# Patient Record
Sex: Female | Born: 1935 | Race: White | Hispanic: No | Smoking: Former smoker
Health system: Southern US, Community
[De-identification: ages and names within clinical notes are randomized; demographics above are authoritative.]

## PROBLEM LIST (undated history)

## (undated) DIAGNOSIS — I351 Nonrheumatic aortic (valve) insufficiency: Secondary | ICD-10-CM

## (undated) DIAGNOSIS — K589 Irritable bowel syndrome without diarrhea: Secondary | ICD-10-CM

## (undated) DIAGNOSIS — K219 Gastro-esophageal reflux disease without esophagitis: Secondary | ICD-10-CM

## (undated) DIAGNOSIS — T7840XA Allergy, unspecified, initial encounter: Secondary | ICD-10-CM

## (undated) DIAGNOSIS — C801 Malignant (primary) neoplasm, unspecified: Secondary | ICD-10-CM

## (undated) DIAGNOSIS — K759 Inflammatory liver disease, unspecified: Secondary | ICD-10-CM

## (undated) DIAGNOSIS — M8580 Other specified disorders of bone density and structure, unspecified site: Secondary | ICD-10-CM

## (undated) DIAGNOSIS — D126 Benign neoplasm of colon, unspecified: Secondary | ICD-10-CM

## (undated) DIAGNOSIS — I493 Ventricular premature depolarization: Secondary | ICD-10-CM

## (undated) DIAGNOSIS — I059 Rheumatic mitral valve disease, unspecified: Secondary | ICD-10-CM

## (undated) DIAGNOSIS — I10 Essential (primary) hypertension: Secondary | ICD-10-CM

## (undated) DIAGNOSIS — D649 Anemia, unspecified: Secondary | ICD-10-CM

## (undated) DIAGNOSIS — Z8679 Personal history of other diseases of the circulatory system: Secondary | ICD-10-CM

## (undated) DIAGNOSIS — R002 Palpitations: Secondary | ICD-10-CM

## (undated) DIAGNOSIS — M3500 Sicca syndrome, unspecified: Secondary | ICD-10-CM

## (undated) DIAGNOSIS — K5730 Diverticulosis of large intestine without perforation or abscess without bleeding: Secondary | ICD-10-CM

## (undated) DIAGNOSIS — M069 Rheumatoid arthritis, unspecified: Secondary | ICD-10-CM

## (undated) DIAGNOSIS — I499 Cardiac arrhythmia, unspecified: Secondary | ICD-10-CM

## (undated) DIAGNOSIS — H04129 Dry eye syndrome of unspecified lacrimal gland: Secondary | ICD-10-CM

## (undated) DIAGNOSIS — N183 Chronic kidney disease, stage 3 (moderate): Secondary | ICD-10-CM

## (undated) DIAGNOSIS — IMO0002 Reserved for concepts with insufficient information to code with codable children: Secondary | ICD-10-CM

## (undated) DIAGNOSIS — H3530 Unspecified macular degeneration: Secondary | ICD-10-CM

## (undated) HISTORY — PX: CATARACT EXTRACTION: SUR2

## (undated) HISTORY — DX: Chronic kidney disease, stage 3 (moderate): N18.3

## (undated) HISTORY — PX: OTHER SURGICAL HISTORY: SHX169

## (undated) HISTORY — DX: Dry eye syndrome of unspecified lacrimal gland: H04.129

## (undated) HISTORY — DX: Irritable bowel syndrome, unspecified: K58.9

## (undated) HISTORY — DX: Personal history of other diseases of the circulatory system: Z86.79

## (undated) HISTORY — DX: Reserved for concepts with insufficient information to code with codable children: IMO0002

## (undated) HISTORY — DX: Sjogren syndrome, unspecified: M35.00

## (undated) HISTORY — DX: Diverticulosis of large intestine without perforation or abscess without bleeding: K57.30

## (undated) HISTORY — DX: Anemia, unspecified: D64.9

## (undated) HISTORY — DX: Palpitations: R00.2

## (undated) HISTORY — PX: TUBAL LIGATION: SHX77

## (undated) HISTORY — DX: Unspecified macular degeneration: H35.30

## (undated) HISTORY — PX: REFRACTIVE SURGERY: SHX103

## (undated) HISTORY — PX: CHOLECYSTECTOMY: SHX55

## (undated) HISTORY — DX: Rheumatic mitral valve disease, unspecified: I05.9

## (undated) HISTORY — PX: HIATAL HERNIA REPAIR: SHX195

## (undated) HISTORY — DX: Malignant (primary) neoplasm, unspecified: C80.1

## (undated) HISTORY — DX: Rheumatoid arthritis, unspecified: M06.9

## (undated) HISTORY — DX: Nonrheumatic aortic (valve) insufficiency: I35.1

## (undated) HISTORY — DX: Ventricular premature depolarization: I49.3

## (undated) HISTORY — DX: Other specified disorders of bone density and structure, unspecified site: M85.80

## (undated) HISTORY — DX: Allergy, unspecified, initial encounter: T78.40XA

## (undated) HISTORY — DX: Gastro-esophageal reflux disease without esophagitis: K21.9

## (undated) HISTORY — DX: Benign neoplasm of colon, unspecified: D12.6

## (undated) HISTORY — DX: Essential (primary) hypertension: I10

## (undated) HISTORY — PX: APPENDECTOMY: SHX54

---

## 1967-11-30 HISTORY — PX: OTHER SURGICAL HISTORY: SHX169

## 1990-11-29 DIAGNOSIS — IMO0002 Reserved for concepts with insufficient information to code with codable children: Secondary | ICD-10-CM

## 1990-11-29 HISTORY — DX: Reserved for concepts with insufficient information to code with codable children: IMO0002

## 1999-02-26 ENCOUNTER — Other Ambulatory Visit: Admission: RE | Admit: 1999-02-26 | Discharge: 1999-02-26 | Payer: Self-pay | Admitting: Obstetrics and Gynecology

## 2000-03-02 ENCOUNTER — Other Ambulatory Visit: Admission: RE | Admit: 2000-03-02 | Discharge: 2000-03-02 | Payer: Self-pay | Admitting: Obstetrics and Gynecology

## 2001-03-06 ENCOUNTER — Other Ambulatory Visit: Admission: RE | Admit: 2001-03-06 | Discharge: 2001-03-06 | Payer: Self-pay | Admitting: Obstetrics and Gynecology

## 2001-04-26 ENCOUNTER — Ambulatory Visit (HOSPITAL_BASED_OUTPATIENT_CLINIC_OR_DEPARTMENT_OTHER): Admission: RE | Admit: 2001-04-26 | Discharge: 2001-04-27 | Payer: Self-pay | Admitting: Orthopedic Surgery

## 2002-03-07 ENCOUNTER — Other Ambulatory Visit: Admission: RE | Admit: 2002-03-07 | Discharge: 2002-03-07 | Payer: Self-pay | Admitting: Obstetrics and Gynecology

## 2003-03-15 ENCOUNTER — Other Ambulatory Visit: Admission: RE | Admit: 2003-03-15 | Discharge: 2003-03-15 | Payer: Self-pay | Admitting: Obstetrics and Gynecology

## 2004-04-15 ENCOUNTER — Other Ambulatory Visit: Admission: RE | Admit: 2004-04-15 | Discharge: 2004-04-15 | Payer: Self-pay | Admitting: Obstetrics and Gynecology

## 2004-04-18 ENCOUNTER — Emergency Department (HOSPITAL_COMMUNITY): Admission: EM | Admit: 2004-04-18 | Discharge: 2004-04-18 | Payer: Self-pay | Admitting: *Deleted

## 2005-04-22 ENCOUNTER — Other Ambulatory Visit: Admission: RE | Admit: 2005-04-22 | Discharge: 2005-04-22 | Payer: Self-pay | Admitting: Addiction Medicine

## 2005-05-20 ENCOUNTER — Ambulatory Visit: Payer: Self-pay | Admitting: Pulmonary Disease

## 2005-06-10 ENCOUNTER — Ambulatory Visit: Payer: Self-pay | Admitting: Pulmonary Disease

## 2005-06-14 ENCOUNTER — Ambulatory Visit: Payer: Self-pay | Admitting: Pulmonary Disease

## 2005-06-22 ENCOUNTER — Ambulatory Visit: Payer: Self-pay | Admitting: Internal Medicine

## 2005-07-06 ENCOUNTER — Ambulatory Visit: Payer: Self-pay | Admitting: Internal Medicine

## 2006-04-26 ENCOUNTER — Other Ambulatory Visit: Admission: RE | Admit: 2006-04-26 | Discharge: 2006-04-26 | Payer: Self-pay | Admitting: Obstetrics and Gynecology

## 2006-05-23 ENCOUNTER — Ambulatory Visit: Payer: Self-pay | Admitting: Pulmonary Disease

## 2006-06-13 ENCOUNTER — Ambulatory Visit: Payer: Self-pay | Admitting: Pulmonary Disease

## 2006-07-19 ENCOUNTER — Ambulatory Visit: Payer: Self-pay | Admitting: Pulmonary Disease

## 2006-08-16 ENCOUNTER — Ambulatory Visit: Payer: Self-pay | Admitting: Pulmonary Disease

## 2007-05-26 ENCOUNTER — Ambulatory Visit: Payer: Self-pay | Admitting: Pulmonary Disease

## 2007-05-26 LAB — CONVERTED CEMR LAB
ALT: 13 units/L (ref 0–35)
AST: 17 units/L (ref 0–37)
Bilirubin Urine: NEGATIVE
Bilirubin, Direct: 0.1 mg/dL (ref 0.0–0.3)
CO2: 30 meq/L (ref 19–32)
Calcium: 9.3 mg/dL (ref 8.4–10.5)
Chloride: 107 meq/L (ref 96–112)
Cholesterol: 215 mg/dL (ref 0–200)
Creatinine, Ser: 0.9 mg/dL (ref 0.4–1.2)
GFR calc non Af Amer: 66 mL/min
Glucose, Bld: 96 mg/dL (ref 70–99)
HDL: 98.9 mg/dL (ref >39.0)
Leukocytes, UA: NEGATIVE
Total Bilirubin: 0.7 mg/dL (ref 0.3–1.2)
Urobilinogen, UA: 0.2 (ref 0.0–1.0)
pH: 6 (ref 5.0–8.0)

## 2007-06-21 ENCOUNTER — Ambulatory Visit: Payer: Self-pay | Admitting: Pulmonary Disease

## 2007-06-21 LAB — CONVERTED CEMR LAB
Fecal Occult Blood: NEGATIVE
OCCULT 1: NEGATIVE
OCCULT 2: NEGATIVE
OCCULT 4: NEGATIVE
OCCULT 5: NEGATIVE

## 2007-10-09 ENCOUNTER — Ambulatory Visit: Payer: Self-pay | Admitting: Pulmonary Disease

## 2007-11-30 HISTORY — PX: ROTATOR CUFF REPAIR: SHX139

## 2008-05-09 ENCOUNTER — Other Ambulatory Visit: Admission: RE | Admit: 2008-05-09 | Discharge: 2008-05-09 | Payer: Self-pay | Admitting: Obstetrics and Gynecology

## 2008-05-15 ENCOUNTER — Encounter: Payer: Self-pay | Admitting: Pulmonary Disease

## 2008-09-04 DIAGNOSIS — E7800 Pure hypercholesterolemia, unspecified: Secondary | ICD-10-CM

## 2008-09-04 DIAGNOSIS — K589 Irritable bowel syndrome without diarrhea: Secondary | ICD-10-CM | POA: Insufficient documentation

## 2008-09-04 DIAGNOSIS — D126 Benign neoplasm of colon, unspecified: Secondary | ICD-10-CM

## 2008-09-04 DIAGNOSIS — IMO0001 Reserved for inherently not codable concepts without codable children: Secondary | ICD-10-CM

## 2008-09-04 DIAGNOSIS — T7840XA Allergy, unspecified, initial encounter: Secondary | ICD-10-CM | POA: Insufficient documentation

## 2008-09-04 DIAGNOSIS — K219 Gastro-esophageal reflux disease without esophagitis: Secondary | ICD-10-CM | POA: Insufficient documentation

## 2008-09-04 HISTORY — DX: Benign neoplasm of colon, unspecified: D12.6

## 2008-09-05 ENCOUNTER — Ambulatory Visit: Payer: Self-pay | Admitting: Pulmonary Disease

## 2008-09-05 DIAGNOSIS — M8580 Other specified disorders of bone density and structure, unspecified site: Secondary | ICD-10-CM | POA: Insufficient documentation

## 2008-09-05 DIAGNOSIS — I480 Paroxysmal atrial fibrillation: Secondary | ICD-10-CM | POA: Insufficient documentation

## 2008-09-05 DIAGNOSIS — Z8679 Personal history of other diseases of the circulatory system: Secondary | ICD-10-CM

## 2008-09-18 ENCOUNTER — Telehealth: Payer: Self-pay | Admitting: Pulmonary Disease

## 2008-09-28 LAB — CONVERTED CEMR LAB
ALT: 17 units/L (ref 0–35)
Bilirubin Urine: NEGATIVE
Bilirubin, Direct: 0.2 mg/dL (ref 0.0–0.3)
CO2: 25 meq/L (ref 19–32)
Calcium: 9.3 mg/dL (ref 8.4–10.5)
Creatinine, Ser: 0.9 mg/dL (ref 0.4–1.2)
Eosinophils Relative: 1.2 % (ref 0.0–5.0)
Glucose, Bld: 93 mg/dL (ref 70–99)
HCT: 38.4 % (ref 36.0–46.0)
Hemoglobin, Urine: NEGATIVE
Hemoglobin: 13.4 g/dL (ref 12.0–15.0)
Monocytes Absolute: 0.9 10*3/uL (ref 0.1–1.0)
Monocytes Relative: 8.3 % (ref 3.0–12.0)
Neutro Abs: 6.9 10*3/uL (ref 1.4–7.7)
Sodium: 138 meq/L (ref 135–145)
Total CHOL/HDL Ratio: 2.4
Total Protein, Urine: NEGATIVE mg/dL
Total Protein: 7.6 g/dL (ref 6.0–8.3)
Triglycerides: 100 mg/dL (ref 0–149)
Urine Glucose: NEGATIVE mg/dL
WBC: 10.4 10*3/uL (ref 4.5–10.5)

## 2008-11-05 ENCOUNTER — Encounter: Payer: Self-pay | Admitting: Adult Health

## 2008-11-05 ENCOUNTER — Ambulatory Visit: Payer: Self-pay | Admitting: Pulmonary Disease

## 2008-11-06 DIAGNOSIS — R002 Palpitations: Secondary | ICD-10-CM | POA: Insufficient documentation

## 2008-11-08 ENCOUNTER — Ambulatory Visit: Payer: Self-pay | Admitting: Pulmonary Disease

## 2008-11-08 ENCOUNTER — Ambulatory Visit: Payer: Self-pay | Admitting: Cardiovascular Disease

## 2008-11-08 ENCOUNTER — Ambulatory Visit: Payer: Self-pay

## 2008-11-26 ENCOUNTER — Ambulatory Visit: Payer: Self-pay

## 2008-11-26 ENCOUNTER — Encounter: Payer: Self-pay | Admitting: Pulmonary Disease

## 2008-11-26 ENCOUNTER — Encounter: Payer: Self-pay | Admitting: Cardiovascular Disease

## 2008-12-17 ENCOUNTER — Ambulatory Visit: Payer: Self-pay | Admitting: Cardiovascular Disease

## 2008-12-25 ENCOUNTER — Ambulatory Visit: Payer: Self-pay | Admitting: Pulmonary Disease

## 2008-12-25 DIAGNOSIS — H04129 Dry eye syndrome of unspecified lacrimal gland: Secondary | ICD-10-CM | POA: Insufficient documentation

## 2008-12-25 DIAGNOSIS — I341 Nonrheumatic mitral (valve) prolapse: Secondary | ICD-10-CM

## 2008-12-30 ENCOUNTER — Ambulatory Visit: Payer: Self-pay | Admitting: Internal Medicine

## 2009-05-21 ENCOUNTER — Encounter: Payer: Self-pay | Admitting: Obstetrics and Gynecology

## 2009-05-21 ENCOUNTER — Ambulatory Visit: Payer: Self-pay | Admitting: Obstetrics and Gynecology

## 2009-05-21 ENCOUNTER — Other Ambulatory Visit: Admission: RE | Admit: 2009-05-21 | Discharge: 2009-05-21 | Payer: Self-pay | Admitting: Obstetrics and Gynecology

## 2009-06-04 DIAGNOSIS — I493 Ventricular premature depolarization: Secondary | ICD-10-CM

## 2009-06-05 ENCOUNTER — Ambulatory Visit: Payer: Self-pay | Admitting: Cardiovascular Disease

## 2009-08-15 ENCOUNTER — Encounter: Payer: Self-pay | Admitting: Cardiovascular Disease

## 2009-08-19 ENCOUNTER — Ambulatory Visit (HOSPITAL_COMMUNITY): Admission: RE | Admit: 2009-08-19 | Discharge: 2009-08-19 | Payer: Self-pay | Admitting: Rheumatology

## 2009-08-19 ENCOUNTER — Telehealth (INDEPENDENT_AMBULATORY_CARE_PROVIDER_SITE_OTHER): Payer: Self-pay | Admitting: *Deleted

## 2009-08-21 ENCOUNTER — Ambulatory Visit: Payer: Self-pay | Admitting: Pulmonary Disease

## 2009-08-21 DIAGNOSIS — M19042 Primary osteoarthritis, left hand: Secondary | ICD-10-CM

## 2009-08-21 DIAGNOSIS — M19041 Primary osteoarthritis, right hand: Secondary | ICD-10-CM

## 2009-08-22 DIAGNOSIS — K5730 Diverticulosis of large intestine without perforation or abscess without bleeding: Secondary | ICD-10-CM | POA: Insufficient documentation

## 2009-08-27 ENCOUNTER — Ambulatory Visit: Payer: Self-pay | Admitting: Pulmonary Disease

## 2009-09-12 ENCOUNTER — Encounter: Payer: Self-pay | Admitting: Pulmonary Disease

## 2010-02-17 ENCOUNTER — Ambulatory Visit: Payer: Self-pay | Admitting: Pulmonary Disease

## 2010-05-05 ENCOUNTER — Telehealth: Payer: Self-pay | Admitting: Pulmonary Disease

## 2010-06-03 ENCOUNTER — Encounter (INDEPENDENT_AMBULATORY_CARE_PROVIDER_SITE_OTHER): Payer: Self-pay | Admitting: *Deleted

## 2010-06-04 ENCOUNTER — Ambulatory Visit: Payer: Self-pay | Admitting: Cardiovascular Disease

## 2010-06-08 ENCOUNTER — Ambulatory Visit: Payer: Self-pay | Admitting: Obstetrics and Gynecology

## 2010-07-10 ENCOUNTER — Encounter: Payer: Self-pay | Admitting: Pulmonary Disease

## 2010-08-11 ENCOUNTER — Telehealth: Payer: Self-pay | Admitting: Pulmonary Disease

## 2010-08-13 ENCOUNTER — Ambulatory Visit: Payer: Self-pay | Admitting: Pulmonary Disease

## 2010-08-13 DIAGNOSIS — D649 Anemia, unspecified: Secondary | ICD-10-CM

## 2010-08-20 ENCOUNTER — Ambulatory Visit: Payer: Self-pay | Admitting: Pulmonary Disease

## 2010-08-21 LAB — CONVERTED CEMR LAB
ALT: 12 units/L (ref 0–35)
AST: 18 units/L (ref 0–37)
Alkaline Phosphatase: 48 units/L (ref 39–117)
BUN: 15 mg/dL (ref 6–23)
Bilirubin, Direct: 0.1 mg/dL (ref 0.0–0.3)
Chloride: 106 meq/L (ref 96–112)
Cholesterol: 189 mg/dL (ref 0–200)
Eosinophils Absolute: 0.2 10*3/uL (ref 0.0–0.7)
LDL Cholesterol: 90 mg/dL (ref 0–99)
MCHC: 34.3 g/dL (ref 30.0–36.0)
MCV: 94.5 fL (ref 78.0–100.0)
Monocytes Absolute: 0.5 10*3/uL (ref 0.1–1.0)
Neutrophils Relative %: 59.4 % (ref 43.0–77.0)
Platelets: 269 10*3/uL (ref 150.0–400.0)
Potassium: 4.8 meq/L (ref 3.5–5.1)
RDW: 13 % (ref 11.5–14.6)
Sodium: 141 meq/L (ref 135–145)
TSH: 2.81 microintl units/mL (ref 0.35–5.50)
Total Bilirubin: 0.6 mg/dL (ref 0.3–1.2)
Total CHOL/HDL Ratio: 2
Vit D, 25-Hydroxy: 52 ng/mL (ref 30–89)

## 2010-12-29 NOTE — Letter (Signed)
Summary: [facility]  [facility]   Imported By: Sherian Rein 07/23/2010 07:51:22  _____________________________________________________________________  External Attachment:    Type:   Image     Comment:   External Document

## 2010-12-29 NOTE — Assessment & Plan Note (Signed)
Summary: F1Y/DM  Medications Added ACYCLOVIR 800 MG TABS (ACYCLOVIR) when pt has an outbreak herpes      Allergies Added:   Primary Provider:  Dr. Kriste Basque  CC:  fatigue.  History of Present Illness: Bailey [name] is seen today in followup for palpitations and PVCs. She saw  Dr. Johney Frame in February and he agreed to continue beta blocker therapy.  she's been doing better.  She was contemplating not taking her p.m. dose according.  She had a trip to the [facility] which was exciting.  Her son recently had an ACL tear and she is helping take care of him. H er palpitations have been well under control and she has not had any significant chest pain dyspnea or syncope.  SHe is happy to continue with her beta blocker.  Her tests at the end of 2009 showed no evidence of structural heart disease.  She continues to see Dr. Nile Riggs for her eye care.   She does have a history of mitral valve prolapse and mild aortic insufficiency.  There is no indication for follow up echo at this time. She has been seeing Dr Corliss Skains for w.u of ? Sjorgens and some arthritis issues  Current Problems (verified): 1)  Hx of Paroxysmal Atrial Fibrillation  (ICD-427.31) 2)  Premature Ventricular Contractions  (ICD-427.69) 3)  Dry Eye Syndrome  (ICD-375.15) 4)  Allergy  (ICD-995.3) 5)  Palpitations  (ICD-785.1) 6)  Hx of Mitral Valve Prolapse  (ICD-424.0) 7)  Hypercholesterolemia  (ICD-272.0) 8)  Gerd  (ICD-530.81) 9)  Diverticulosis of Colon  (ICD-562.10) 10)  Irritable Bowel Syndrome  (ICD-564.1) 11)  Colonic Polyps  (ICD-211.3) 12)  Arthritis, Rheumatoid  (ICD-714.0) 13)  Fibromyalgia  (ICD-729.1) 14)  Osteopenia  (ICD-733.90)  Current Medications (verified): 1)  Restasis 0.05 % Emul (Cyclosporine) .... Use Two Times A Day 2)  Buffered Aspirin 325 Mg Tabs (Aspirin Buf(Cacarb-Mgcarb-Mgo)) .... Take 1 Tablet By Mouth Once A Day 3)  Carvedilol 3.125 Mg Tabs (Carvedilol) .... Take One Tablet By Mouth Once Daily As  Directed... 4)  Prempro 0.45-1.5 Mg Tabs (Conj Estrog-Medroxyprogest Ace) .... Take 1 Tablet By Mouth Once A Day 5)  Aleve 220 Mg Tabs (Naproxen Sodium) .... Take One Tablet By Mouth Two Times A Day With Meals 6)  Vitamin D 400 Unit  Tabs (Cholecalciferol) .... Take 2 Capsules By Mouth Once Daily 7)  Acyclovir 800 Mg Tabs (Acyclovir) .... When Pt Has An Outbreak Herpes  Allergies (verified): 1)  ! Penicillin 2)  ! Sulfa 3)  ! Erythromycin 4)  ! * Diamox  Past History:  Past Medical History: Last updated: 02/17/2010  DRY EYE SYNDROME (ICD-375.15) ALLERGY (ICD-995.3) PALPITATIONS (ICD-785.1) Hx of MITRAL VALVE PROLAPSE (ICD-424.0) HYPERCHOLESTEROLEMIA (ICD-272.0) GERD (ICD-530.81) DIVERTICULOSIS OF COLON (ICD-562.10) IRRITABLE BOWEL SYNDROME (ICD-564.1) COLONIC POLYPS (ICD-211.3) ARTHRITIS, RHEUMATOID (ICD-714.0) FIBROMYALGIA (ICD-729.1) OSTEOPENIA (ICD-733.90)  Past Surgical History: Last updated: 02/17/2010 S/P cholecystectomy S/P appendectomy S/P HH repair S/P right shoulder surg 2009 by DrCaffrey  Family History: Last updated: September 25, 2008 mother deceased age 16 from stroke--hx of HBP father deceased age 59 from massive MI  hx of ulcerative colitis 1 sibling alive age 27  Social History: Last updated: September 25, 2008 quit smoking---in 1972 exercises 3 times per week caffeine use:  1 cup coffee per day etoh---3-4 times per week married 4 children  Review of Systems       Denies fever, malais, weight loss, blurry vision, decreased visual acuity, cough, sputum, SOB, hemoptysis, pleuritic pain, palpitaitons, heartburn, abdominal pain, melena, lower extremity  edema, claudication, or rash.   Vital Signs:  Patient profile:   77 year old female Height:      62 inches Weight:      119 pounds BMI:     21.84 Pulse rate:   70 / minute Resp:     12 per minute BP sitting:   150 / 77  (left arm)  Vitals Entered By: Kem Parkinson (June 04, 2010 3:19 PM)  Physical  Exam  General:  Affect appropriate Healthy:  appears stated age HEENT: normal Neck supple with no adenopathy JVP normal no bruits no thyromegaly Lungs clear with no wheezing and good diaphragmatic motion Heart:  S1/S2 no murmur,rub, gallop or click PMI normal Abdomen: benighn, BS positve, no tenderness, no AAA no bruit.  No HSM or HJR Distal pulses intact with no bruits No edema Neuro non-focal Skin warm and dry    Impression & Recommendations:  Problem # 1:  PALPITATIONS (ICD-785.1) Benign continue BB Her updated medication list for this problem includes:    Buffered Aspirin 325 Mg Tabs (Aspirin buf(cacarb-mgcarb-mgo)) .Marland Kitchen... Take 1 tablet by mouth once a day    Carvedilol 3.125 Mg Tabs (Carvedilol) .Marland Kitchen... Take one tablet by mouth once daily as directed...  Problem # 2:  Hx of MITRAL VALVE PROLAPSE (ICD-424.0) Stable with no sig murmur.  F/U echo in 2 years or if symptoms ensue Her updated medication list for this problem includes:    Carvedilol 3.125 Mg Tabs (Carvedilol) .Marland Kitchen... Take one tablet by mouth once daily as directed...  Patient Instructions: 1)  Your physician recommends that you schedule a follow-up appointment in: ONE YEAR   EKG Report  Procedure date:  06/04/2010  Findings:      NSR 63 LAE Normal ECG

## 2010-12-29 NOTE — Letter (Signed)
Summary: Colonoscopy Date Change Letter  [facility]  [address] [place], [place] [address]   Phone: [phone]  Fax: [phone]      June 03, 2010 MRN: [id number]   Bailey [name] [address] [place], [place]  [address]   Dear [name],   Previously you were recommended to have a repeat colonoscopy around this time. Your chart was recently reviewed by Dr. Hedwig Morton. Juanda Chance of  Gastroenterology. Follow up colonoscopy is now recommended in August 2013. This revised recommendation is based on current, nationally recognized guidelines for colorectal cancer screening and polyp surveillance. These guidelines are endorsed by the American Cancer Society, [employer] on Colorectal Cancer as well as numerous other major medical organizations.  Please understand that our recommendation assumes that you do not have any new symptoms such as bleeding, a change in bowel habits, anemia, or significant abdominal discomfort. If you do have any concerning GI symptoms or want to discuss the guideline recommendations, please call to arrange an office visit at your earliest convenience. Otherwise we will keep you in our reminder system and contact you 1-2 months prior to the date listed above to schedule your next colonoscopy.  Thank you,  Hedwig Morton. Juanda Chance, M.D.  [facility] [phone]

## 2010-12-29 NOTE — Progress Notes (Signed)
Summary: labs entered for physical  Phone Note Call from Patient Call back at Home Phone [phone] Call back at PT   Caller: Patient Call For: NADEL Summary of Call: PT NEED LABS PUT IN FOR CPX Initial call taken by: Rickard Patience,  August 11, 2010 9:34 AM  Follow-up for Phone Call        Patient sch for CPX on 9/22. Please advise on labs.Michel Bickers [facility]  August 11, 2010 9:43 AM   labs are in computer for pt---she can come in anytime as long as she is fasting. thanks [name] CMA  August 11, 2010 11:10 AM  [facility] to advise lab order palced. [name] CMA  August 11, 2010 11:17 AM   pt returned call to triage.  Please call hetr back at [phone].   Follow-up by: Eugene Gavia,  August 11, 2010 12:05 PM  Additional Follow-up for Phone Call Additional follow up Details #1::        pt advised. [name] CMA  August 11, 2010 12:14 PM

## 2010-12-29 NOTE — Assessment & Plan Note (Signed)
Summary: 6 months/ mbw   CC:  6 month ROV & f/u mult medical problems....  History of Present Illness: 77 y/o WF here for a follow up visit... she has multiple medical problems as noted below...     ~  Oct09:  Yearly check up- feeling well w/o new complaints or concerns... CXR was OK (mild biapical pleuroparenchymal scarring w/o change);  Labs were essent WNL including electrolytes and TSH...  ~  Dec09:  ret w/ c/o palpit & EKG showed PVC's w/ bigeminy... referred to [employer] w/ eval revealing:  unifocal fixed coupled PVC's;  Myoview was neg- no infarct, no ischemia, freq PVC's;  2DEcho w/ norm LVF, EF= 60%, no regional abn, mild calcif AoV w/ mild AI, mild MR, incr PA sys 37-41 range;  event monitor w/ PVC's only (official report pending)... DrNishan Rx w/ COREG 3.125mg Bid & improved... she says the PVC's are coming from the bottom of her heart & she has an appt w/ DrAllred for EP...  improved w/ fewer palpit and no "spells" on the Coreg...   ~  August 21, 2009:  we spent 30 min going over her recent eval by [name] for Rheum w/ Dx of Rheum Arthritis- started w/ left elbow prob, DrCaffrey, & refer to Rheum... they are considering MTX Rx (she is allergic to Sulfa)... she had CXR/ Labs- all pending from [facility] office to review... she saw DrNishan 7/10 & DrAllred 2/10- palpit & PVCs stable on Coreg Rx...   ~  February 17, 2010:  she reports doing well- exerc at gym 2-3d/wk... notes that [name] told her she has a form of RA that affects the systemic organs, but her prev Raynaud's symptoms have resolved... she still uses Restasis for dry eyes... due for TDAP vaccine today...    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  ALLERGY (ICD-995.3) - stable w/ OTC meds Prn... she saw DrJacobs in 2006 for sinusitis.  PALPITATIONS (ICD-785.1) - *** see Cardiac eval *** now on COREG 3.125mg  Qam...  ~  notes from [facility] & DrAllred  reviewed...  Hx of MITRAL VALVE PROLAPSE (ICD-424.0) - on ASA 325mg /d... she denies CP, notes occas "heavy" feeling, rare palpit, she is very active without difficulty...  ~  2DEcho 12/09 did not show MVP- norm LVF, EF= 60%, no regional abn, mild calcif AoV w/ mild AI, mild MR, incr PA sys 37-41 range...  Hx of PAROXYSMAL ATRIAL FIBRILLATION (ICD-427.31)  HYPERCHOLESTEROLEMIA (ICD-272.0) - on diet + FishOil... but she has a high HDL in the 75-105 range.  ~  FLP 6/08 showed TChol 215, TG 78, HDL 99, LDL 88  ~  FLP10/09 showed TChol 196, TG 100, HDL 82, LDL 94  GERD (ICD-530.81) - she had a HH repair in [place] 40+ yrs ago... occas takes OTC Prilosec Prn now...  DIVERTICULITIS, COLON (ICD-562.11),  IRRITABLE BOWEL SYNDROME (ICD-564.1),  & COLONIC POLYPS (ICD-211.3) - last colonoscopy 8/06 showed divertics only, no recurrent polyps... f/u planned 12yrs... last polyp removed 8/03= adenomatous...  ARTHRITIS, RHEUMATOID (ICD-714.0) *** see Rheumatology eval ***   ~  s/p right shoulder surg 2009 by DrCaffrey...  ~  9/10:  she also describes Raynaud's phenomenon & has dry eye ?sicca syndrome?...  ~  10/10:  she had lat epicondylitis right elbow...  ? of FIBROMYALGIA (ICD-729.1) - she states she's had this "all my life" yet she is very active and notes that "exercises help"...  OSTEOPENIA (ICD-733.90) - on Calcium supplements and Vit  D OTC...  Health Maintenance -  she sees [name] for GYN- he does her PAP smears, Mammograms, and BMD's... she tells me that her VitD level per GYN was OK and she takes MVI + extra D OTC... she is on [facility] per Gyn... she got the Shingles vaccine at health dept in 2008... 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  ~  IMMUNIZATIONS:  she had PNEUMOVAX 2008 @ age 72... she gets yearly Flu vaccine... OK TDAP 3/11 here...   Allergies: 1)  ! Penicillin 2)  ! Sulfa 3)  ! Erythromycin 4)  ! * Diamox  Comments:  Nurse/Medical Assistant: The patient's  medications and allergies were reviewed with the patient and were updated in the Medication and Allergy Lists.  Past History:  Past Medical History:  DRY EYE SYNDROME (ICD-375.15) ALLERGY (ICD-995.3) PALPITATIONS (ICD-785.1) Hx of MITRAL VALVE PROLAPSE (ICD-424.0) HYPERCHOLESTEROLEMIA (ICD-272.0) GERD (ICD-530.81) DIVERTICULOSIS OF COLON (ICD-562.10) IRRITABLE BOWEL SYNDROME (ICD-564.1) COLONIC POLYPS (ICD-211.3) ARTHRITIS, RHEUMATOID (ICD-714.0) FIBROMYALGIA (ICD-729.1) OSTEOPENIA (ICD-733.90)  Past Surgical History: S/P cholecystectomy S/P appendectomy S/P HH repair S/P right shoulder surg 2009 by DrCaffrey  Family History: Reviewed history from 09/05/2008 and no changes required. mother deceased age 1 from stroke--hx of HBP father deceased age 59 from massive MI  hx of ulcerative colitis 1 sibling alive age 43  Social History: Reviewed history from 09/05/2008 and no changes required. quit smoking---in 1972 exercises 3 times per week caffeine use:  1 cup coffee per day etoh---3-4 times per week married 4 children  Review of Systems      See HPI  The patient denies anorexia, fever, weight loss, weight gain, vision loss, decreased hearing, hoarseness, chest pain, syncope, dyspnea on exertion, peripheral edema, prolonged cough, headaches, hemoptysis, abdominal pain, melena, hematochezia, severe indigestion/heartburn, hematuria, incontinence, muscle weakness, suspicious skin lesions, transient blindness, difficulty walking, depression, unusual weight change, abnormal bleeding, enlarged lymph nodes, and angioedema.    Vital Signs:  Patient profile:   77 year old female Height:      62 inches Weight:      119.13 pounds BMI:     21.87 O2 Sat:      97 % on Room air Temp:     98.6 degrees F oral Pulse rate:   75 / minute BP sitting:   138 / 70  (right arm) Cuff size:   regular  Vitals Entered By: [name] CMA (February 17, 2010 11:12 AM)  O2 Sat at Rest %:   97 O2 Flow:  Room air CC: 6 month ROV & f/u mult medical problems... Is Patient Diabetic? No Pain Assessment Patient in pain? no      Comments meds updated today   Physical Exam  Additional Exam:  WD, WN, 77 y/o WF in NAD... GENERAL:  Alert & oriented; pleasant & cooperative... HEENT:  [place]/AT, EOM-wnl, PERRLA, EACs-clear, TMs-wnl, NOSE-clear, THROAT-clear & wnl. NECK:  Supple w/ fairROM; no JVD; normal carotid impulses w/o bruits; no thyromegaly or nodules palpated; no lymphadenopathy. CHEST:  Clear to P & A; without wheezes/ rales/ or rhonchi. HEART:  sl irreg w/ PB's noted; without murmurs/ rubs/ or gallops heard... ABDOMEN:  Soft & nontender; normal bowel sounds; no organomegaly or masses detected. EXT: without deformities, mild arthritic changes; no varicose veins/ venous insuffic/ or edema. NEURO:  CN's intact; motor testing normal; sensory testing normal; gait normal & balance OK. DERM:  No lesions noted; no rash etc...    Impression & Recommendations:  Problem # 1:  Hx of MITRAL VALVE PROLAPSE (ICD-424.0) She is doing well on the Coreg-  weaned to 3.125mg  Qam... Her updated medication list for this problem includes:    Buffered Aspirin 325 Mg Tabs (Aspirin buf(cacarb-mgcarb-mgo)) .Marland Kitchen... Take 1 tablet by mouth once a day    Carvedilol 3.125 Mg Tabs (Carvedilol) .Marland Kitchen... Take one tablet by mouth once daily as directed...  Problem # 2:  HYPERCHOLESTEROLEMIA (ICD-272.0) Continue diet + Fish Oil...  Problem # 3:  GERD (ICD-530.81) Stable on Prn Prilosec... Her updated medication list for this problem includes:    Prilosec 20 Mg Cpdr (Omeprazole) .Marland Kitchen... Take 1 tablet by mouth as needed for acid symptoms...  Problem # 4:  COLONIC POLYPS (ICD-211.3) Stable and up to date...  Problem # 5:  ARTHRITIS, RHEUMATOID (ICD-714.0) Followed by [name]...  Problem # 6:  OTHER MEDICAL PROBLEMS AS NOTED>>>  Complete Medication List: 1)  Restasis 0.05 % Emul (Cyclosporine) .... Use  two times a day 2)  Buffered Aspirin 325 Mg Tabs (Aspirin buf(cacarb-mgcarb-mgo)) .... Take 1 tablet by mouth once a day 3)  Carvedilol 3.125 Mg Tabs (Carvedilol) .... Take one tablet by mouth once daily as directed... 4)  Prilosec 20 Mg Cpdr (Omeprazole) .... Take 1 tablet by mouth as needed for acid symptoms.Marland KitchenMarland Kitchen 5)  Prempro 0.45-1.5 Mg Tabs (Conj estrog-medroxyprogest ace) .... Take 1 tablet by mouth once a day 6)  Aleve 220 Mg Tabs (Naproxen sodium) .... Take one tablet by mouth two times a day with meals 7)  Vitamin D 400 Unit Tabs (Cholecalciferol) .... Take 2 capsules by mouth once daily  Other Orders: Tdap => 22yrs IM ([address]) Admin 1st Vaccine ([address])  Patient Instructions: 1)  Today we updated your med list- see below.... 2)  Continue your current meds the same for now... 3)  Today we gave you the combination Tetanus vaccine called the TDAP- it should be good for 6yrs.Marland KitchenMarland Kitchen 4)  Let's plan a follow up visit in about 6 months w/ FASTING blood work done prior to the secheduled return (call us 1-2 weeks prior so we can order the blood work)...   Immunizations Administered:  Tetanus Vaccine:    Vaccine Type: Tdap    Site: left deltoid    Mfr: boostrix    Dose: 0.5 ml    Route: IM    Given by: [name] CMA    Exp. Date: 02/21/2012    Lot #: [id number]    VIS given: 10/17/07 version given February 17, 2010.

## 2010-12-29 NOTE — Assessment & Plan Note (Signed)
Summary: 56m reck/klw   Primary Care Provider:  Dr. Kriste Basque  CC:  6 month ROV & review of mult medical problems....  History of Present Illness: 77 y/o WF here for a follow up visit... she has multiple medical problems as noted below...     ~  Oct09:  Yearly check up- feeling well w/o new complaints or concerns... CXR was OK (mild biapical pleuroparenchymal scarring w/o change);  Labs were essent WNL including electrolytes and TSH...  ~  Dec09:  ret w/ c/o palpit & EKG showed PVC's w/ bigeminy... referred to [employer] w/ eval revealing:  unifocal fixed coupled PVC's;  Myoview was neg- no infarct, no ischemia, freq PVC's;  2DEcho w/ norm LVF, EF= 60%, no regional abn, mild calcif AoV w/ mild AI, mild MR, incr PA sys 37-41 range;  event monitor w/ PVC's only (official report pending)... DrNishan Rx w/ COREG 3.125mg Bid & improved... she says the PVC's are coming from the bottom of her heart & she has an appt w/ DrAllred for EP...  improved w/ fewer palpit and no "spells" on the Coreg...   ~  August 21, 2009:  we spent 30 min going over her recent eval by [name] for Rheum w/ Dx of Rheum Arthritis- started w/ left elbow prob, DrCaffrey, & refer to Rheum... they are considering MTX Rx (she is allergic to Sulfa)... she had CXR/ Labs- all pending from [facility] office to review... she saw DrNishan 7/10 & DrAllred 2/10- palpit & PVCs stable on Coreg Rx...   ~  February 17, 2010:  she reports doing well- exerc at gym 2-3d/wk... notes that [name] told her she has a form of RA that affects the systemic organs, but her prev Raynaud's symptoms have resolved... she still uses Restasis for dry eyes... due for TDAP vaccine today...   ~  August 20, 2010:  6 mo ROV- doing well overall w/ busy summer & traveling alot... c/o fatigue, saw [name] 8/11 for f/u Sjogren's, OA, on Voltaren gel Prn (offered shot, declined)... she saw DrNishan7/11 f/u palpit, hx PVC, MVP, mild AI- on low dose Coreg &  stable, no changes made... she had f/u [name] as well & she informs me he did BMD & "it's stable"... recent fasting blood work reviewed w/ pt & copy given... OK Flu vaccine today.   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  ALLERGY (ICD-995.3) - stable w/ OTC meds Prn... she saw DrJacobs in 2006 for sinusitis.  PALPITATIONS (ICD-785.1) - *** see Cardiac eval *** now on COREG 3.125mg  Qam...  ~  notes from [place] & DrAllred reviewed> followed for palpit, hx PVCs, hx PAF, mild MVP, mild AI.  Hx of MITRAL VALVE PROLAPSE (ICD-424.0) - on ASA 325mg /d... she denies CP, notes occas "heavy" feeling, rare palpit, she is very active without difficulty...  ~  2DEcho 12/09 did not show MVP- norm LVF, EF= 60%, no regional abn, mild calcif AoV w/ mild AI, mild MR, incr PA sys 37-41 range...  ~    Hx of PAROXYSMAL ATRIAL FIBRILLATION (ICD-427.31)  HYPERCHOLESTEROLEMIA, BORDERLINE (ICD-272.0) - on diet + FishOil... but she has a high HDL in the 75-105 range.  ~  FLP 6/08 showed TChol 215, TG 78, HDL 99, LDL 88  ~  FLP 10/09 showed TChol 196, TG 100, HDL 82, LDL 94  ~  FLP 9/11 showed TChol 189, TG 73, HDL 84, LDL 90  GERD (ICD-530.81) - she had a HH repair in  [place] 40+ yrs ago... occas takes OTC Prilosec Prn now...  DIVERTICULITIS, COLON (ICD-562.11),  IRRITABLE BOWEL SYNDROME (ICD-564.1),  & COLONIC POLYPS (ICD-211.3) - last colonoscopy 8/06 showed divertics only, no recurrent polyps... f/u due 8/13 per DrDBrodie's note...  last polyp removed 8/03= adenomatous...  ARTHRITIS, RHEUMATOID (ICD-714.0) *** see Rheumatology eval *** [name]'s notes reviewed> she has Raynaud's, Sjogren's, OA, & right shoulder impingement syndrome...   ~  s/p right shoulder surg 2009 by DrCaffrey...  ~  9/10:  she also describes Raynaud's phenomenon & has dry eye ?sicca syndrome?...  ~  10/10:  she had lat epicondylitis right elbow...  ? of FIBROMYALGIA (ICD-729.1) -  she states she's had this "all my life" yet she is very active and notes that "exercises help"...  OSTEOPENIA (ICD-733.90) - on Calcium supplements and Vit D OTC... her BMD's are done by [name] & pt reports that last BMD in 2011 was "stable"... Vit D level 9/11 = 52...  Health Maintenance -  she sees [name] for GYN- he does her PAP smears, Mammograms, and BMD's... she tells me that her VitD level per GYN was OK and she takes MVI + extra D OTC... she is on [facility] per Gyn... she got the Shingles vaccine at health dept in 2008... 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  ~  IMMUNIZATIONS:  she had PNEUMOVAX 2008 @ age 33... she gets yearly Flu vaccine... OK TDAP 3/11 here...   Preventive Screening-Counseling & Management  Alcohol-Tobacco     Smoking Status: quit     Year Quit: 1972  Allergies: 1)  ! Penicillin 2)  ! Sulfa 3)  ! Erythromycin 4)  ! * Diamox  Comments:  Nurse/Medical Assistant: The patient's medications and allergies were reviewed with the patient and were updated in the Medication and Allergy Lists.  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 mother deceased age 49 from stroke--hx of HBP father deceased age 90 from massive MI  hx of ulcerative colitis 1 sibling alive age 24  Social History: Reviewed history from 09/05/2008 and no changes required. quit smoking---in 1972 exercises 3 times per  week caffeine use:  1 cup coffee per day etoh---3-4 times per week married 4 children  Review of Systems      See HPI  The patient denies anorexia, fever, weight loss, weight gain, vision loss, decreased hearing, hoarseness, chest pain, syncope, dyspnea on exertion, peripheral edema, prolonged cough, headaches, hemoptysis, abdominal pain, melena, hematochezia, severe indigestion/heartburn, hematuria, incontinence, muscle weakness, suspicious skin lesions, transient blindness, difficulty walking, depression, unusual weight change, abnormal bleeding, enlarged lymph nodes, and angioedema.    Vital Signs:  Patient profile:   77 year old female Height:      62 inches Weight:      119.25 pounds O2 Sat:      99 % on Room air Temp:     98.0 degrees F oral Pulse rate:   66 / minute BP sitting:   120 / 64  (right arm) Cuff size:   regular  Vitals Entered By: [name] CMA (August 20, 2010 8:58 AM)  O2 Sat at  Rest %:  99 O2 Flow:  Room air CC: 6 month ROV & review of mult medical problems... Is Patient Diabetic? No Pain Assessment Patient in pain? no      Comments meds updated today with pt   Physical Exam  Additional Exam:  WD, WN, 77 y/o WF in NAD... GENERAL:  Alert & oriented; pleasant & cooperative... HEENT:  [place]/AT, EOM-wnl, PERRLA, EACs-clear, TMs-wnl, NOSE-clear, THROAT-clear & wnl. NECK:  Supple w/ fairROM; no JVD; normal carotid impulses w/o bruits; no thyromegaly or nodules palpated; no lymphadenopathy. CHEST:  Clear to P & A; without wheezes/ rales/ or rhonchi. HEART:  sl irreg w/ PB's noted; without murmurs/ rubs/ or gallops heard... ABDOMEN:  Soft & nontender; normal bowel sounds; no organomegaly or masses detected. EXT: without deformities, mild arthritic changes; no varicose veins/ venous insuffic/ or edema. NEURO:  CN's intact; motor testing normal; sensory testing normal; gait normal & balance OK. DERM:  No lesions noted; no rash etc...    MISC.  Report  Procedure date:  08/13/2010  Findings:      Lipid Panel (LIPID)   Cholesterol               189 mg/dL                   1-610   Triglycerides             73.0 mg/dL                  9.6-045.4   HDL                       09.81 mg/dL                 >19.14   LDL Cholesterol           90 mg/dL                    7-82   BMP (METABOL)   Sodium                    141 mEq/L                   135-145   Potassium                 4.8 mEq/L                   3.5-5.1   Chloride                  106 mEq/L                   96-112   Carbon Dioxide            29 mEq/L                    19-32   Glucose                   88 mg/dL                    95-62   BUN                       15 mg/dL                    1-30   Creatinine  0.8 mg/dL                   1.6-1.0   Calcium                   9.0 mg/dL                   9.6-04.5   GFR                       73.26 mL/min                >60  Hepatic/Liver Function Panel (HEPATIC)   Total Bilirubin           0.6 mg/dL                   4.0-9.8   Direct Bilirubin          0.1 mg/dL                   1.1-9.1   Alkaline Phosphatase      48 U/L                      39-117   AST                       18 U/L                      0-37   ALT                       12 U/L                      0-35   Total Protein             6.4 g/dL                    4.7-8.2   Albumin                   3.7 g/dL                    9.5-6.2  Comments:      CBC Platelet w/Diff (CBCD)   White Cell Count          7.4 K/uL                    4.5-10.5   Red Cell Count       [L]  3.75 Mil/uL                 3.87-5.11   Hemoglobin                12.1 g/dL                   13.0-86.5   Hematocrit           [L]  35.4 %                      36.0-46.0   MCV                       94.5 fl                     78.0-100.0   Platelet  Count            269.0 K/uL                  150.0-400.0   Neutrophil %              59.4 %                      43.0-77.0   Lymphocyte %               30.7 %                      12.0-46.0   Monocyte %                6.8 %                       3.0-12.0   Eosinophils%              2.3 %                       0.0-5.0   Basophils %               0.8 %                       0.0-3.0   TSH (TSH)   FastTSH                   2.81 uIU/mL                 0.35-5.50  Vitamin D (25-Hydroxy) (84132)  Vitamin D (25-Hydroxy)                             52 ng/mL                    30-89   Impression & Recommendations:  Problem # 1:  CARDIAC>>> Followed by Walker Kehr & Allred>  stable on Coreg, ASA... she remains active, etc...  Problem # 2:  HYPERCHOLESTEROLEMIA (ICD-272.0) She has an excellent HDL & is happy w/ her labs...  Problem # 3:  GI>>> Followed by DrDBrodie on PPI, and colon not due til 8/13 per her review...  Problem # 4:  RHEUM>>> Followed by [name] w/ Raynaud's, Sjogren's, OA, shoulder impingement, ?FM.Marland KitchenMarland Kitchen continue her Rx.  Problem # 5:  OSTEOPENIA (ICD-733.90) BMD's followed by [name] & pt reports she is "stable"...  Problem # 6:  OTHER MEEICAL PROBLEMS AS NOTED>>> OK Flu shot...  Complete Medication List: 1)  Restasis 0.05 % Emul (Cyclosporine) .... Use two times a day 2)  Buffered Aspirin 325 Mg Tabs (Aspirin buf(cacarb-mgcarb-mgo)) .... Take 1 tablet by mouth once a day 3)  Carvedilol 3.125 Mg Tabs (Carvedilol) .... Take one tablet by mouth once daily as directed... 4)  Fish Oil 1000 Mg Caps (Omega-3 fatty acids) .... Take 1 tablet by mouth once a day 5)  Prempro 0.45-1.5 Mg Tabs (Conj estrog-medroxyprogest ace) .... Take 1 tablet by mouth once a day 6)  Aleve 220 Mg Tabs (Naproxen sodium) .... Take one tablet by mouth two times a day with meals 7)  Vitamin D 400 Unit Tabs (Cholecalciferol) .... Take 2 capsules by mouth once daily 8)  Acyclovir 800 Mg Tabs (Acyclovir) .... When pt has an outbreak herpes  Other Orders:  Flu Vaccine 82yrs + MEDICARE PATIENTS (X1062) Administration Flu vaccine - MCR  (I9485)  Patient Instructions: 1)  Today we updated your med list- see below.... 2)  Continue your current meds the same... 3)  We reviewed your recent labs & gave you a copy for your records... 4)  Today we gave you the 2011 Flu vaccine... 5)  Call for any problems.Marland KitchenMarland Kitchen 6)  Please schedule a follow-up appointment in 6 months.   Flu Vaccine Consent Questions     Do you have a history of severe allergic reactions to this vaccine? no    Any prior history of allergic reactions to egg and/or gelatin? no    Do you have a sensitivity to the preservative Thimersol? no    Do you have a past history of Guillan-Barre Syndrome? no    Do you currently have an acute febrile illness? no    Have you ever had a severe reaction to latex? no    Vaccine information given and explained to patient? yes    Are you currently pregnant? no    Lot Number:[id number]   Exp Date:05/29/2011   Site Given  Left Deltoid IMdflu Abigail Miyamoto RN  August 20, 2010 9:34 AM

## 2010-12-29 NOTE — Progress Notes (Signed)
Summary: appt  Phone Note Call from Patient Call back at Home Phone [phone]   Caller: Patient Call For: Bailey [name] Summary of Call: Cough and sinus x 2 weeks, wants to be seen by Dr. Kriste Basque today. Initial call taken by: Darletta Moll,  May 05, 2010 8:58 AM  Follow-up for Phone Call        Pt c/o a dry hacking coughx 2 weeks. She also states she feels some post nasal drip which makes her throat sore. Please advise. [name] CMA  May 05, 2010 10:53 AM allergies: PCN, sulfa, erythromycin, diamox [facility]  Additional Follow-up for Phone Call Additional follow up Details #1::        per SN---zpak #1  take as directed, astepro nasal spray  #1  1-2 sprays in each nostril two times a day for drainage--called and spoke with pt and she is aware of recs per SN---these meds have been sent to pts pharmacy [name] CMA  May 05, 2010 11:55 AM     New/Updated Medications: ZITHROMAX Z-PAK 250 MG TABS (AZITHROMYCIN) take as directed ASTEPRO 0.15 % SOLN (AZELASTINE HCL) 1-2 sprays in each nostril two times a day for nasal drainage Prescriptions: ASTEPRO 0.15 % SOLN (AZELASTINE HCL) 1-2 sprays in each nostril two times a day for nasal drainage  #1 x 1   Entered by:   [name] CMA   Authorized by:   Michele Mcalpine MD   Signed by:   [name] CMA on 05/05/2010   Method used:   Electronically to        [facility] (retail)       [address]       [place], [place]  [id number]       Ph: [phone] or [phone]       Fax: [phone]   RxID:   [id number] ZITHROMAX Z-PAK 250 MG TABS (AZITHROMYCIN) take as directed  #1 pack x 0   Entered by:   [name] CMA   Authorized by:   Michele Mcalpine MD   Signed by:   [name] CMA on 05/05/2010   Method used:   Electronically to        [facility] (retail)       [address]       [place], [place]  [id number]       Ph: [phone] or [phone]       Fax: [phone]   RxID:    [id number]

## 2010-12-31 ENCOUNTER — Encounter: Payer: Self-pay | Admitting: Pulmonary Disease

## 2011-02-09 NOTE — Letter (Signed)
Summary: [facility]  [facility]   Imported By: Sherian Rein 02/03/2011 10:33:04  _____________________________________________________________________  External Attachment:    Type:   Image     Comment:   External Document

## 2011-02-16 ENCOUNTER — Encounter: Payer: Self-pay | Admitting: Adult Health

## 2011-02-16 ENCOUNTER — Ambulatory Visit: Payer: Self-pay | Admitting: Pulmonary Disease

## 2011-02-16 ENCOUNTER — Telehealth: Payer: Self-pay | Admitting: Pulmonary Disease

## 2011-02-16 NOTE — Telephone Encounter (Signed)
Lm with husband for pt to call back Carver F[place], [place]

## 2011-02-16 NOTE — Telephone Encounter (Signed)
Called and spoke with pt and she is coming in 03/30/11 at 12:00 for a follow up with SN Carver [place], [place]

## 2011-03-30 ENCOUNTER — Ambulatory Visit (INDEPENDENT_AMBULATORY_CARE_PROVIDER_SITE_OTHER): Payer: Medicare Other | Admitting: Pulmonary Disease

## 2011-03-30 ENCOUNTER — Encounter: Payer: Self-pay | Admitting: Pulmonary Disease

## 2011-03-30 DIAGNOSIS — K219 Gastro-esophageal reflux disease without esophagitis: Secondary | ICD-10-CM

## 2011-03-30 DIAGNOSIS — IMO0001 Reserved for inherently not codable concepts without codable children: Secondary | ICD-10-CM

## 2011-03-30 DIAGNOSIS — E7800 Pure hypercholesterolemia, unspecified: Secondary | ICD-10-CM

## 2011-03-30 DIAGNOSIS — I059 Rheumatic mitral valve disease, unspecified: Secondary | ICD-10-CM

## 2011-03-30 DIAGNOSIS — M069 Rheumatoid arthritis, unspecified: Secondary | ICD-10-CM

## 2011-03-30 DIAGNOSIS — D126 Benign neoplasm of colon, unspecified: Secondary | ICD-10-CM

## 2011-03-30 DIAGNOSIS — M899 Disorder of bone, unspecified: Secondary | ICD-10-CM

## 2011-03-30 DIAGNOSIS — M3500 Sicca syndrome, unspecified: Secondary | ICD-10-CM

## 2011-03-30 DIAGNOSIS — I4949 Other premature depolarization: Secondary | ICD-10-CM

## 2011-03-30 NOTE — Patient Instructions (Signed)
Today we updated your meds in our EPIC system...    Continue your current meds the same...  Have a great time in [place]... Call for any problems... Let's plan a routine recheck w/ Fasting blood work in 6 months.Marland KitchenMarland Kitchen

## 2011-03-30 NOTE — Progress Notes (Signed)
Subjective:    Patient ID: Bailey [name], female    DOB: Mar 01, 1933, 77 y.o.   MRN: [id number]  HPI 77 y/o WF here for a follow up visit... she has multiple medical problems as noted below...    ~  February 17, 2010:  she reports doing well- exerc at gym 2-3d/wk... notes that [name] told her she has a form of RA that affects the systemic organs, but her prev Raynaud's symptoms have resolved... she still uses Restasis for dry eyes... due for TDAP vaccine today...  ~  August 20, 2010:  6 mo ROV- doing well overall w/ busy summer & traveling alot... c/o fatigue, saw [name] 8/11 for f/u Sjogren's, OA, on Voltaren gel Prn (offered shot, pt declined)... she saw [facility] 7/11 f/u palpit, hx PVC, MVP, mild AI- on low dose Coreg & stable, no changes made... she had f/u [name] as well & she informs me he did BMD & "it's stable"... recent fasting blood work reviewed w/ pt & copy given... OK Flu vaccine today.  ~  Mar 30, 2011:  52mo ROV & she rports doing satis, much rheumatic complaints but all stable;  She saw [name] 2/12> remains stable, symptoms not severe enough for dis modifying agents;  We reviewed last labs 9/11- all OK;  Going to [place] this summer...         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  ALLERGY (ICD-995.3) - stable w/ OTC meds Prn... she saw DrJacobs in 2006 for sinusitis.  PALPITATIONS (ICD-785.1) - Cardiac eval DrNishan- now on COREG 3.125mg  Qam... ~  notes from [place] & DrAllred reviewed> followed for palpit, hx PVCs, hx PAF, mild MVP, mild AI.  Hx of MITRAL VALVE PROLAPSE (ICD-424.0) - on ASA 325mg /d... she denies CP, notes occas "heavy" feeling, rare palpit, she is very active without difficulty... ~  2DEcho 12/09 did not show MVP- norm LVF, EF= 60%, no regional abn, mild calcif AoV w/ mild AI, mild MR, incr PA sys 37-41 range...  Hx of PAROXYSMAL ATRIAL FIBRILLATION (ICD-427.31)  HYPERCHOLESTEROLEMIA, BORDERLINE  (ICD-272.0) - on diet + FishOil... but she has a high HDL in the 75-105 range. ~  FLP 6/08 showed TChol 215, TG 78, HDL 99, LDL 88 ~  FLP 10/09 showed TChol 196, TG 100, HDL 82, LDL 94 ~  FLP 9/11 showed TChol 189, TG 73, HDL 84, LDL 90  GERD (ICD-530.81) - she had a HH repair in [place] 40+ yrs ago... occas takes OTC Prilosec Prn now...  DIVERTICULITIS, COLON (ICD-562.11) IRRITABLE BOWEL SYNDROME (ICD-564.1) COLONIC POLYPS (ICD-211.3) - last colonoscopy 8/06 showed divertics only, no recurrent polyps... f/u due 8/13 per DrDBrodie's note...  last polyp removed 8/03= adenomatous...  ARTHRITIS, RHEUMATOID (ICD-714.0) - Rheumatology eval by [name]> she has Raynaud's, Sjogren's, OA, & right shoulder impingement syndrome...  ~  s/p right shoulder surg 2009 by DrCaffrey... ~  9/10:  she also describes Raynaud's phenomenon & has dry eye ?sicca syndrome?... ~  10/10:  she had lat epicondylitis right elbow...  ? of FIBROMYALGIA (ICD-729.1) - she states she's had this "all my life" yet she is very active and notes that "exercises help"...  OSTEOPENIA (ICD-733.90) - on Calcium supplements and Vit D OTC... her BMD's are done by [name] & pt reports that last BMD in 2011 was "stable"... Vit D level 9/11 = 52...  Health Maintenance -  she sees [name] for GYN- he does her PAP smears, Mammograms, and BMD's... she tells me  that her VitD level per GYN was OK and she takes MVI + extra D OTC... she is on [facility] per Gyn... she got the Shingles vaccine at health dept in 2008... 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 ~  IMMUNIZATIONS:  she had PNEUMOVAX 2008 @ age 28... she gets yearly Flu vaccine... OK TDAP 3/11 here...   Past Surgical History  Procedure Date  . Cholecystectomy   . Appendectomy   . Hiatal hernia repair   . Right shoulder sugery 2009    by Dr. Madelon Lips    Outpatient Encounter Prescriptions as of 03/30/2011  Medication Sig Dispense Refill  . acyclovir (ZOVIRAX)  800 MG tablet Take as directed when pt has an outbreak       . aspirin 325 MG tablet Take 325 mg by mouth daily.        . carvedilol (COREG) 3.125 MG tablet Take one tablet by mouth daily as directed       . Cholecalciferol (VITAMIN D) 400 UNITS capsule Take 2 capsules by mouth daily       . cycloSPORINE (RESTASIS) 0.05 % ophthalmic emulsion 1 drop 2 (two) times daily.        Marland Kitchen estrogen, conjugated,-medroxyprogesterone (PREMPRO) 0.45-1.5 MG per tablet Take 1 tablet by mouth daily.        . fish oil-omega-3 fatty acids 1000 MG capsule Take one capsule by mouth two times daily      . naproxen sodium (ANAPROX) 220 MG tablet Take 220 mg by mouth daily.         Allergies  Allergen Reactions  . Diamox (Acetazolamide) Nausea Only  . Erythromycin     REACTION: severe nausea  . Penicillins     REACTION: unsure of reaction  . Sulfonamide Derivatives     REACTION: nausea-elevated fever--kidney problems    Review of Systems         See HPI - all other systems neg except as noted...  The patient denies anorexia, fever, weight loss, weight gain, vision loss, decreased hearing, hoarseness, chest pain, syncope, dyspnea on exertion, peripheral edema, prolonged cough, headaches, hemoptysis, abdominal pain, melena, hematochezia, severe indigestion/heartburn, hematuria, incontinence, muscle weakness, suspicious skin lesions, transient blindness, difficulty walking, depression, unusual weight change, abnormal bleeding, enlarged lymph nodes, and angioedema.    Objective:   Physical Exam    WD, WN, 77 y/o WF in NAD... GENERAL:  Alert & oriented; pleasant & cooperative... HEENT:  [place]/AT, EOM-wnl, PERRLA, EACs-clear, TMs-wnl, NOSE-clear, THROAT-clear & wnl. NECK:  Supple w/ fairROM; no JVD; normal carotid impulses w/o bruits; no thyromegaly or nodules palpated; no lymphadenopathy. CHEST:  Clear to P & A; without wheezes/ rales/ or rhonchi. HEART:  sl irreg w/ PB's noted; without murmurs/ rubs/ or gallops  heard... ABDOMEN:  Soft & nontender; normal bowel sounds; no organomegaly or masses detected. EXT: without deformities, mild arthritic changes; no varicose veins/ venous insuffic/ or edema. NEURO:  CN's intact; motor testing normal; sensory testing normal; gait normal & balance OK. DERM:  No lesions noted; no rash etc...   Assessment & Plan:    Sjogren's syndrome>  She uses Restasis, keeps lozenges & water handy, etc...  MVP>  Hx palpit controlled w/ Coreg 3.125mg  once daily, no CP/SOB/ etc...  LIPIDS>  On diet + fish oil, she has excellent HDL etc...  GI>  GERD, Divertics, IBS, Polyps> she is up to date & doing satis...  RHEUM>  Followed by Rober Minion, notes reviewed, continue OTC meds Prn.Marland KitchenMarland Kitchen  Other medical issues as noted.Marland KitchenMarland Kitchen

## 2011-04-13 NOTE — Assessment & Plan Note (Signed)
[facility]                             PULMONARY OFFICE NOTE   Bailey [name], Bailey [name]                          MRN:          [id number]  DATE:10/09/2007                            DOB:          07/14/1933    HISTORY OF PRESENT ILLNESS:  The patient is a 77 year old white female  patient of Dr. Kriste Basque, who has a known history of gastroesophageal reflux  disease, hyperlipidemia, fibromyalgia and mitral valve prolapse.  She  presents today for an acute office visit.  The patient complains of a 4-  day history of nasal congestion, post-nasal drip, sore throat, minimally  productive cough.  The patient denies any purulent sputum, fever, chest  pain, orthopnea, PND, neck pain or recent travel or antibiotic use.   PAST MEDICAL HISTORY:  Reviewed.   CURRENT MEDICATIONS:  Reviewed.   PHYSICAL EXAMINATION:  The patient is a pleasant female in no acute  distress.  She is afebrile with stable vital signs.  Oxygen saturation  90% on room air.  HEENT: Nasal mucosa with mild redness. Posterior pharynx is clear.  NECK:  Supple without cervical adenopathy and no JVD.  LUNGS:  Lung sounds are clear.  CARDIAC:  Regular rate and rhythm.  ABDOMEN:  Soft and nontender.  EXTREMITIES:  Warm without any edema.   IMPRESSION AND PLAN:  ACUTE UPPER RESPIRATORY INFECTION AND RHINITIS  FLARE.  The patient is to begin Mucinex DM twice daily.  Nasal hygiene  regimen with saline Afrin nasal spray x5 days; instructional sheet  given.  The patient was also given a Z-pack to have on hold, in case  symptoms worsen with purulent sputum.  The patient will follow up with  Dr. Kriste Basque as scheduled, or sooner.      Rubye Oaks, NP  Electronically Signed      Lonzo Cloud. Kriste Basque, MD  Electronically Signed   TP/MedQ  DD: 10/09/2007  DT: 10/10/2007  Job #: [id number]

## 2011-04-13 NOTE — Assessment & Plan Note (Signed)
[facility]                            CARDIOLOGY OFFICE NOTE   Bailey, [name]                          MRN:          [id number]  DATE:11/08/2008                            DOB:          [birth year]    A 77 year old patient referred for mitral valve prolapse, palpitations,  shortness of breath.  Bailey [name] is an extremely delightful 77 year old  patient referred by Dr. Kriste Basque.  She has a longstanding history of mitral  valve prolapse.   She apparently had scarlet fever as a youngster, spent some time in an  Infectious Disease hospital for about 6 weeks.  She has not had an echo  in about 20 years.   She describes multiple previous episodes years ago where she would go to  the dentist and feel horrible for a week or two.  She has had lifelong  palpitations; however, over the last 2 months, they have accelerated.  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  She has a very good  understanding of describing her palpitations.  She describes having 3  different arrhythmias.  She thinks she has had PAF, PAT, and PVCs in  bigeminy.   From her history, these arrhythmias are happening daily.  They seem to  have accelerated over the last 2 months.  She was told not to drink  alcohol or have caffeine, but there is no obvious detrimental effects  from them.   The palpitations seem to get better with exercise.  She and her husband  work out 3 times a week at [employer].  She does aerobic activities.  She  notices that when she sits down, the palpitations are worse.   They are associated with fatigue and shortness of breath.  They are not  associated with chest pain, diaphoresis, or radiation to the arm.   The patient has not had any syncope.   I will have to look back through her records.  She apparently had blood  work done in October.  There is an EKG from November 05, 2008, from Dr.  Jodelle Green office that shows bigeminal PVC pattern.   Her review of systems  is otherwise negative.   Her past medical history is remarkable for history of scarlet fever,  history of GERD, history of hypercholesterolemia, history of  diverticulitis, fibromyalgia, osteopenia.   She is allergic to PENICILLIN, SULFA, ERYTHROMYCIN, and DIAMOX.   She has had a previous cholecystectomy, appendectomy, and hiatal hernia  repair.  Her mother died at the age of 34 from a stroke.  Father died at  the age of 8 from a massive MI.   The patient is happily married.  She is originally from [place].  She  has 4 children, 3 of whom live in [place].  She quit smoking in  1972.  She exercises at [employer] 3 days a week.  She does drink 3-4 times  a week and use caffeine.   Her husband is a patient here, who has had an ablation by Dr. Ladona Ridgel,  and used to work for [employer].   Her  current medications include:  1. Prempro 0.45 a day.  2. An aspirin a day.  3. Vitamin D.  4. Restasis.   PHYSICAL EXAMINATION:  GENERAL:  Remarkable for a thin female, in no  distress.  Her blood pressure is 120/80, pulse 75 with occasional PVCs.  Weight 116.  Afebrile.  HEENT:  Unremarkable.  NECK:  Carotids normal without bruit.  No lymphadenopathy, thyromegaly,  JVP elevation.  LUNGS:  Clear.  Good diaphragmatic motion.  No wheezing.  S1 and S2.  No  obvious click or MVP murmur.  PMI normal.  ABDOMEN:  Benign.  She has multiple surgical scars.  No AAA.  No  tenderness.  No bruit.  No hepatosplenomegaly, hepatojugular reflux, or  tenderness.  EXTREMITIES:  Distal pulses are intact.  No edema.  NEUROLOGIC:  Nonfocal.  SKIN:  Warm and dry.  MUSCULOSKELETAL:  No muscular weakness.   EKG done in our office today shows sinus rhythm with nonspecific ST-T  wave changes, possible left atrial enlargement, no PVCs.   IMPRESSION:  1. Palpitations.  The patient will get an event monitor.  We will      start her on low-dose Coreg 3.125 b.i.d.  2. Premature ventricular contractions.  I  talked to her at length      about suppressing premature ventricular contractions.  We first      have to do a workup to rule out structural heart disease.  It is      encouraging that her premature ventricular contractions are worse      with sitting down and not exercise.  She will have a stress Myoview      study to make sure there is no malignant arrhythmias with exercise      since she works out 3 days a week and also to rule out coronary      artery disease.  3. History of mitral valve prolapse, fairly benign on exam.  We will      check a 2-D echocardiogram to assess right ventricular and left      ventricular function particularly in light of her premature      ventricular contractions.   It is very important in this patient to differentiate between atrial and  ventricular arrhythmias, most of which she is describing sounds like  premature ventricular contractions.  These maybe difficult to suppress.  I do not like using antiarrhythmic drugs until after we have diagnosed  that the heart is structurally sound.  I will see her back in 4 weeks  after we have the event monitor data.  In the meantime early next week,  she will have her echo and stress Myoview to rule out structural heart  disease.   1. Postmenopausal.  Continue Prempro.  Follow up with Dr. Kriste Basque.  2. History of hypercholesterolemia.  The patient currently not on      therapy.  I will look at her labs.  She has no history of coronary      artery disease.  If her stress test is normal, I am not sure it      would be worth treating this.  She tells me her cholesterol has      been fine lately with an HDL of 18.  3. History of reflux.  Continue Prevacid p.r.n.     Bailey [name]. Bailey Emms, MD, [facility]  Electronically Signed    PCN/MedQ  DD: 11/08/2008  DT: 11/08/2008  Job #: [id number]   cc:   Lorin Picket  Elayne Snare, MD

## 2011-04-13 NOTE — Assessment & Plan Note (Signed)
[facility]                            CARDIOLOGY OFFICE NOTE   KIMIA, FINAN                          MRN:          [id number]  DATE:12/17/2008                            DOB:          10-Jun-1933    HISTORY OF PRESENT ILLNESS:  Bailey [name] returns today for followup.  She has  had palpitations, they were crescendo for about 2 months.  I started her  on low-dose beta-blocker.  Her event monitor did show PVCs and bigeminy.  Most of the PVCs appeared to be coupled at a fixed cycle length.   Her stress test was nonischemic.  She had normal LV function.  Her echo  showed normal LV function with some aortic valve sclerosis, trivial  aortic insufficiency, really no significant mitral valve prolapse.   I talked to [facility] at length.  She still continues to be a bit worried  about her heart.  She apparently had a father who died suddenly of what  sounds more like an aneurysm that a coronary event.   She has tolerated her low-dose carvedilol.   She is not having any significant chest pain, PND, or orthopnea.  She  thinks getting rest really helped her more than anything else.   In regards to her palpitations, she still has them, but just not  consistently.   There has been no presyncope and no diaphoresis.  No exertional  shortness of breath and no chest pain.   REVIEW OF SYSTEMS:  Otherwise negative.   ALLERGIES:  She is allergic to PENICILLIN, SULFA, E-MYCIN, and Diamox.   MEDICATIONS:  She is on  1. Prempro 0.5 mg a day.  2. Aspirin a day.  3. Vitamin D.  4. Restasis.  5. Carvedilol 3.125 b.i.d.   PHYSICAL EXAMINATION:  GENERAL:  Remarkable for somewhat anxious female,  in no distress.  She is quite talkative and had many questions.  VITAL SIGNS:  Her pulse is currently sinus rhythm with occasional PVCs.  Weight is 117, blood pressure 130/80, pulse 80 and irregular.  HEENT:  Unremarkable.  NECK:  Carotids normal without bruit.  No lymphadenopathy,  thyromegaly,  or JVP elevation.  LUNGS:  Clear.  Good diaphragmatic motion.  No wheezing.  S1 and S2 with  normal heart sounds.  PMI normal.  ABDOMEN:  Benign.  Bowel sounds positive.  No AAA, no tenderness, no  bruit, hepatosplenomegaly, and no hepatojugular reflux.  EXTREMITIES:  Distal pulses are intact.  No edema.  NEURO:  Nonfocal.  SKIN:  Warm and dry.  No muscular weakness.   Echocardiogram and Myoview reviewed.  Multiple pages of event monitor  reviewed.   IMPRESSION:  A couple of premature ventricular contractions symptoms  improved on beta-blocker therapy.  Refer to electrophysiologic study  particularly Dr. Hillis Range, although I do not think she should have  an invasive procedure at this point, though she is having coupled  premature ventricular contractions and ongoing palpitations and may be a  candidate for an ablation for ectopic focus.  1. History of mitral valve prolapse.  Echocardiogram not showing  prolapse with just trivial mitral regurgitation.  No need for SBE      prophylaxis.  2. Postmenopausal.  Continue Prempro.   Overall, I think the patient had reassuring results from her noninvasive  studies.  We will see how our conversation with EP goes.  At some point  it would be worthwhile to escalate her beta-blocker.     Noralyn Pick. Eden Emms, MD, [facility]  Electronically Signed    PCN/MedQ  DD: 12/17/2008  DT: 12/17/2008  Job #: [id number]

## 2011-04-13 NOTE — Letter (Signed)
December 30, 2008    Bailey [name]. Bailey Emms, MD, [facility]  [address]  [address]  [place], [place] [address]   RE:  Bailey, [name]  MRN:  [id number]  /  DOB:  14-Nov-1933   Dear Bailey [name],   It was my pleasure to see your patient Bailey [name] in Electrophysiology  consultation today regarding symptomatic premature ventricular  contractions.  As you recall, she is a very pleasant 77 year old female  with a history of symptomatic premature ventricular contractions.  She  reports longstanding palpitations for most of her life.  She had scarlet  fever at age 74 and was diagnosed with an enlarged heart at that time.  She notes that over the past 4 months, she has had increasing frequency  and forcefulness of her palpitations.  She reports associated shortness  of breath and fatigue.  She approximately 3 months ago went on vacation  to a high altitude and feels that this precipitated worsening of her  PVCs.  She was subsequently evaluated you and was initiated on Coreg.  Since that time, she has done very well.  She notes that the frequency  and forcefulness of her PVCs has significantly diminished.  She does  report occasional fatigue, but feels that her shortness of breath and  palpitations have improved.  She denies any presyncope or syncope and is  otherwise without complaint today.   PAST MEDICAL HISTORY:  1. Premature ventricular contractions (as above).  2. History of scarlet fever at age 31 with a enlarged heart.  3. Mitral valve prolapse.  4. Degenerative joint disease.  5. Status post cholecystectomy.  6. Status post cataract surgery bilaterally.   ALLERGIES:  PENICILLIN and SULFA.   CURRENT MEDICATIONS:  1. Prempro 0.45 mg daily.  2. Aspirin 325 mg daily.  3. Vitamin D 800 IU daily.  4. Carvedilol 3.125 mg b.i.d.   SOCIAL HISTORY:  The patient lives in [place] and is a retired Engineer, civil (consulting).  She denies tobacco and drug use.  She rarely drinks alcohol.   FAMILY HISTORY:  The patient's mother had  palpitations and mitral valve  prolapse.  Her father had ulcerative colitis and died suddenly following  tremendous back pain and has felt to have had an aneurysmal rupture.  She denies any family history of sudden cardiac death.   REVIEW OF SYSTEMS:  All systems are reviewed and negative except as  outlined in the HPI above.   PHYSICAL EXAMINATION:  VITALS:  Blood pressure 128/68, heart rate 68,  respirations 18, and weight 116 pounds.  GENERAL:  The patient is a well-appearing female in no acute distress.  She is alert and oriented x3.  HEENT:  Normocephalic, atraumatic.  Sclerae clear, conjunctivae pink.  Oropharynx clear.  NECK:  Supple.  No thyromegaly, lymphadenopathy, JVD, or bruits.  LUNGS:  Clear to auscultation bilaterally.  HEART:  Regular rate and rhythm.  No murmurs, rubs, or gallops.  GI:  Soft, nontender, and nondistended.  Positive bowel sounds.  EXTREMITIES:  No clubbing, cyanosis, or edema.  NEUROLOGIC:  Nonfocal.  SKIN:  No ecchymosis or laceration.  MUSCULOSKELETAL:  No deformity or atrophy.  PSYCHIATRIC:  Euthymic mood.  Full affect.   EKG performed on November 08, 2008, reveals sinus rhythm at 74 beats per  minute with left atrial enlargement and an incomplete right bundle-  branch block, otherwise unremarkable.   Transthoracic echocardiogram on December 28, 2007, reveals a left  ventricular end-diastolic dimension of 38 mm with a left ventricular  ejection fraction of 60%.  There is no left ventricular regional wall  motion abnormalities.  There is no evidence of mitral valve prolapse.  No right ventricular abnormalities were observed.   Myoview performed on November 26, 2008, revealed no evidence of scar or  ischemia.   IMPRESSION:  Bailey [name] is a pleasant 77 year old female with symptomatic  premature ventricular contractions who presents today for  Electrophysiology consultation.  She has no evidence of structural heart  disease or ischemia by recent  functional testing.  She has not had  presyncope or syncope.  An event monitor was placed on November 08, 2008, through November 28, 2008, which revealed premature ventricular  contractions, but no sustained ventricular arrhythmias.  Premature  ventricular contractions often occur in a bigeminal and trigeminal  pattern.  Her premature ventricular contractions have significantly  improved with addition of carvedilol since that time.   PLAN:  Therapeutic strategies for symptomatic premature ventricular  contractions were discussed in detail with the patient today including  both medicine and catheter-based therapies.  Presently, she appears to  be doing quite well with carvedilol therapy alone.  I believe that her  PVCs are benign and that her focus should be on her symptoms.  As her  symptoms have improved with Coreg, I do not feel that further diagnostic  or therapeutic strategies are necessary at this time.  If she has  worsening of her PVCs, then we could consider increasing her carvedilol  dose.  Certainly, if she has significant worsening in frequency and  symptoms, then we could consider catheter ablation down the road.   Bailey [name], thank you for the opportunity of participating in the care of [name].  Please feel free to contact me if you wish to discuss her care  further.  I have returned her care to you in its entirety.  Please feel  free to contact me if I can be of further assistance.    Sincerely,      Bailey Range, MD  Electronically Signed    JA/MedQ  DD: 12/30/2008  DT: 12/31/2008  Job #: [id number]

## 2011-04-16 NOTE — Op Note (Signed)
[place]  Patient:    Bailey [name], Bailey [name]                          MRN: [id number] Proc. Date: 04/26/01 Adm. Date:  [id number] Attending:  Teena Dunk                           Operative Report  INDICATIONS:  This is a 77 year old with shoulder pain and impingement-type phenomenon with [facility] joint arthritis, partial rotator cuff tear, thought to be amenable to arthroscopy, possible overnight hospitalization.  PREOPERATIVE DIAGNOSIS:  Impingement, AC joint arthritis.  Degenerative tear in anterior superior labrum.  POSTOPERATIVE DIAGNOSIS:  Impingement, AC joint arthritis.  Degenerative tear in anterior superior labrum.  PROCEDURE:  Arthroscopic acromioplasty.  Arthroscopic debridement of torn labrum.  Arthroscopic excision of distal clavicle.  SURGEON:  Sharlot Gowda., M.D.  ANESTHESIA:  General with supplementation with a stellate block.  DESCRIPTION OF PROCEDURE:  Examination under anesthesia with no instability, no loss of motion.  Arthroscope through the posterolateral and anterior portal.  Systematic inspection of the shoulder showed the patient to have some mild hyperemia in the shoulder.  Did not appear to have synovitis, possibly consistent with some very early capsulitis.  Arm motion was normal.  She had a degenerative tear in the anterior labrum with no instability and no significant degenerative changes and was debrided intra-articularly separate from the procedure.  Subacromial space was entered.  It was inflammed.  There was definitely clinical evidence of impingement, but no significant tearing of the cuff, but a small abrasion-type tear which was debrided.  Significant impingement relieved by releasing CA ligament for about 6 to 7 mm of bone anterior leading into the acromion.  The [facility] joint was significantly degeneratively resected of about a 1 cm resection of the distal clavicle to relieve AC joint  arthritis and the impingement was relieved.  The resection lines were checked from the anterior and lateral portals and deemed to be satisfactory as well.  Superior surface of the cuff again showed no evidence of significant tearing.  Portals were closed with nylon and due to the block, no Marcaine was used.  A light and compressive sterile dressing was then applied and taken to the recovery room in stable condition. DD:  04/26/01 TD:  04/26/01 Job: [id number]

## 2011-05-31 ENCOUNTER — Encounter: Payer: Self-pay | Admitting: Pulmonary Disease

## 2011-06-01 ENCOUNTER — Encounter: Payer: Self-pay | Admitting: Cardiovascular Disease

## 2011-06-15 ENCOUNTER — Encounter (INDEPENDENT_AMBULATORY_CARE_PROVIDER_SITE_OTHER): Payer: Medicare Other | Admitting: Obstetrics and Gynecology

## 2011-06-15 ENCOUNTER — Other Ambulatory Visit (HOSPITAL_COMMUNITY)
Admission: RE | Admit: 2011-06-15 | Discharge: 2011-06-15 | Disposition: A | Discharge status: Home / Self Care | Payer: Medicare Other | Source: Ambulatory Visit | Attending: Obstetrics and Gynecology | Admitting: Obstetrics and Gynecology

## 2011-06-15 ENCOUNTER — Other Ambulatory Visit: Payer: Self-pay | Admitting: Obstetrics and Gynecology

## 2011-06-15 DIAGNOSIS — N952 Postmenopausal atrophic vaginitis: Secondary | ICD-10-CM

## 2011-06-15 DIAGNOSIS — Z124 Encounter for screening for malignant neoplasm of cervix: Secondary | ICD-10-CM

## 2011-06-15 DIAGNOSIS — N951 Menopausal and female climacteric states: Secondary | ICD-10-CM

## 2011-06-15 DIAGNOSIS — R82998 Other abnormal findings in urine: Secondary | ICD-10-CM

## 2011-06-15 DIAGNOSIS — N393 Stress incontinence (female) (male): Secondary | ICD-10-CM

## 2011-06-29 ENCOUNTER — Encounter: Payer: Self-pay | Admitting: Cardiovascular Disease

## 2011-06-29 ENCOUNTER — Ambulatory Visit (INDEPENDENT_AMBULATORY_CARE_PROVIDER_SITE_OTHER): Payer: Medicare Other | Admitting: Cardiovascular Disease

## 2011-06-29 VITALS — BP 155/76 | HR 70 | Ht 62.0 in | Wt 119.0 lb

## 2011-06-29 DIAGNOSIS — R609 Edema, unspecified: Secondary | ICD-10-CM

## 2011-06-29 DIAGNOSIS — E7800 Pure hypercholesterolemia, unspecified: Secondary | ICD-10-CM

## 2011-06-29 DIAGNOSIS — R0609 Other forms of dyspnea: Secondary | ICD-10-CM

## 2011-06-29 DIAGNOSIS — R002 Palpitations: Secondary | ICD-10-CM

## 2011-06-29 DIAGNOSIS — I059 Rheumatic mitral valve disease, unspecified: Secondary | ICD-10-CM

## 2011-06-29 DIAGNOSIS — R0600 Dyspnea, unspecified: Secondary | ICD-10-CM

## 2011-06-29 NOTE — Patient Instructions (Signed)

## 2011-06-29 NOTE — Assessment & Plan Note (Signed)
Cholesterol is at goal.  Continue current dose of statin and diet Rx.  No myalgias or side effects.  F/U  LFT's in 6 months. Lab Results  Component Value Date   LDLCALC 90 08/13/2010

## 2011-06-29 NOTE — Progress Notes (Signed)
Bailey [name] is seen today in followup for palpitations and PVCs. She saw Dr. Johney Frame in February 2011 and he agreed to continue beta blocker therapy. she's been doing better. She was contemplating not taking her p.m. dose according. She had a trip to the beach which was exciting.  Her palpitations have been well under control and she has not had any significant chest pain or syncope. SHe is happy to continue with her beta blocker. Her tests at the end of 2009 showed no evidence of structural heart disease. She continues to see Dr. Nile Riggs for her eye care. She does have a history of mitral valve prolapse and mild aortic insufficiency.  She had some dyspnea at the beach was was unsettling along with some LE edema.  No excess salt.  Edema has happened in past due to standing and ankle varicosities  ROS: Denies fever, malais, weight loss, blurry vision, decreased visual acuity, cough, sputum, , hemoptysis, pleuritic pain, palpitaitons, heartburn, abdominal pain, melena, l, claudication, or rash.  All other systems reviewed and negative  General: Affect appropriate Healthy:  appears stated age HEENT: normal Neck supple with no adenopathy JVP normal no bruits no thyromegaly Lungs clear with no wheezing and good diaphragmatic motion Heart:  S1/S2 no murmur,rub, gallop or click PMI normal Abdomen: benighn, BS positve, no tenderness, no AAA no bruit.  No HSM or HJR Distal pulses intact with no bruits Trace bilateral edema Neuro non-focal Skin warm and dry No muscular weakness Ankle varicosities   Current Outpatient Prescriptions  Medication Sig Dispense Refill  . acyclovir (ZOVIRAX) 800 MG tablet Take as directed when pt has an outbreak       . aspirin 325 MG tablet Take 325 mg by mouth daily.        . calcipotriene-betamethasone (TACLONEX) ointment Apply topically at bedtime.        . carvedilol (COREG) 3.125 MG tablet Take one tablet by mouth daily as directed       . Cholecalciferol (VITAMIN D) 400  UNITS capsule Take 2 capsules by mouth daily       . Clobetasol Propionate (CLOBEX SPRAY EX) Apply topically every morning.        . cycloSPORINE (RESTASIS) 0.05 % ophthalmic emulsion 1 drop 2 (two) times daily.        Marland Kitchen estrogen, conjugated,-medroxyprogesterone (PREMPRO) 0.45-1.5 MG per tablet Take 1 tablet by mouth daily.        . fish oil-omega-3 fatty acids 1000 MG capsule Take one capsule by mouth two times daily      . naproxen sodium (ANAPROX) 220 MG tablet Take 220 mg by mouth daily.         Allergies  Diamox; Erythromycin; Penicillins; and Sulfonamide derivatives  Electrocardiogram:  NSR 70 LAE otherwise normal ecg  Assessment and Plan

## 2011-06-29 NOTE — Assessment & Plan Note (Signed)
No murmur.  In setting of dyspnea and edema will recheck echo

## 2011-06-29 NOTE — Assessment & Plan Note (Signed)
Improved on beta blocker Continue current dose

## 2011-06-29 NOTE — Assessment & Plan Note (Signed)
Dependant with ankle varicosities  Support hose, low sodium diet and elevate legs at end of day

## 2011-07-02 ENCOUNTER — Other Ambulatory Visit: Payer: Self-pay | Admitting: *Deleted

## 2011-07-02 MED ORDER — CARVEDILOL 3.125 MG PO TABS: 3.1250 mg | ORAL_TABLET | ORAL | Status: DC

## 2011-07-06 ENCOUNTER — Ambulatory Visit (HOSPITAL_COMMUNITY): Payer: Medicare Other | Attending: Cardiovascular Disease | Admitting: Radiology

## 2011-07-06 DIAGNOSIS — R0600 Dyspnea, unspecified: Secondary | ICD-10-CM

## 2011-07-06 DIAGNOSIS — I059 Rheumatic mitral valve disease, unspecified: Secondary | ICD-10-CM | POA: Insufficient documentation

## 2011-07-06 DIAGNOSIS — R0989 Other specified symptoms and signs involving the circulatory and respiratory systems: Secondary | ICD-10-CM | POA: Insufficient documentation

## 2011-07-06 DIAGNOSIS — I4891 Unspecified atrial fibrillation: Secondary | ICD-10-CM | POA: Insufficient documentation

## 2011-07-06 DIAGNOSIS — R0602 Shortness of breath: Secondary | ICD-10-CM | POA: Insufficient documentation

## 2011-07-06 DIAGNOSIS — R002 Palpitations: Secondary | ICD-10-CM | POA: Insufficient documentation

## 2011-07-06 DIAGNOSIS — I359 Nonrheumatic aortic valve disorder, unspecified: Secondary | ICD-10-CM | POA: Insufficient documentation

## 2011-07-06 DIAGNOSIS — R609 Edema, unspecified: Secondary | ICD-10-CM | POA: Insufficient documentation

## 2011-07-06 DIAGNOSIS — R0609 Other forms of dyspnea: Secondary | ICD-10-CM | POA: Insufficient documentation

## 2011-07-06 DIAGNOSIS — Z8249 Family history of ischemic heart disease and other diseases of the circulatory system: Secondary | ICD-10-CM | POA: Insufficient documentation

## 2011-07-06 DIAGNOSIS — E785 Hyperlipidemia, unspecified: Secondary | ICD-10-CM | POA: Insufficient documentation

## 2011-07-06 DIAGNOSIS — I0989 Other specified rheumatic heart diseases: Secondary | ICD-10-CM | POA: Insufficient documentation

## 2011-07-14 NOTE — Progress Notes (Signed)
pt aware of results Bailey [name]  

## 2011-07-15 ENCOUNTER — Other Ambulatory Visit: Payer: Self-pay | Admitting: *Deleted

## 2011-07-16 ENCOUNTER — Other Ambulatory Visit: Payer: Self-pay | Admitting: *Deleted

## 2011-07-26 ENCOUNTER — Other Ambulatory Visit: Payer: Self-pay | Admitting: Obstetrics and Gynecology

## 2011-07-28 ENCOUNTER — Other Ambulatory Visit: Payer: Self-pay | Admitting: *Deleted

## 2011-07-28 MED ORDER — CARVEDILOL 3.125 MG PO TABS: 3.1250 mg | ORAL_TABLET | ORAL | Status: DC

## 2011-07-28 NOTE — Telephone Encounter (Signed)
RX SENT IN TODAY FOR CARVEDILOL 3.125 MG QD # 90. Danielle Rankin

## 2011-08-30 ENCOUNTER — Ambulatory Visit (INDEPENDENT_AMBULATORY_CARE_PROVIDER_SITE_OTHER): Payer: Medicare Other

## 2011-08-30 DIAGNOSIS — Z23 Encounter for immunization: Secondary | ICD-10-CM

## 2011-09-23 ENCOUNTER — Telehealth: Payer: Self-pay | Admitting: Pulmonary Disease

## 2011-09-23 DIAGNOSIS — R609 Edema, unspecified: Secondary | ICD-10-CM

## 2011-09-23 DIAGNOSIS — R002 Palpitations: Secondary | ICD-10-CM

## 2011-09-23 DIAGNOSIS — M3500 Sicca syndrome, unspecified: Secondary | ICD-10-CM

## 2011-09-23 DIAGNOSIS — D126 Benign neoplasm of colon, unspecified: Secondary | ICD-10-CM

## 2011-09-23 DIAGNOSIS — D649 Anemia, unspecified: Secondary | ICD-10-CM

## 2011-09-23 DIAGNOSIS — M899 Disorder of bone, unspecified: Secondary | ICD-10-CM

## 2011-09-23 DIAGNOSIS — E7800 Pure hypercholesterolemia, unspecified: Secondary | ICD-10-CM

## 2011-09-23 NOTE — Telephone Encounter (Signed)
LMOM for pt Bailey [name].  Looks like pt was last seen by SN on 03/30/11 and told to f/u in 6 months with fasting bloodwork.  When does pt want to come in for labs?

## 2011-09-24 NOTE — Telephone Encounter (Signed)
Pt says she would like to come by on Mon., 09/27/11 for fasting blood work before appt on 09/30/2011. Pls advise on labs that are needed.

## 2011-09-24 NOTE — Telephone Encounter (Signed)
lmomtcb  

## 2011-09-24 NOTE — Telephone Encounter (Signed)
Called and lmom to make pt aware that her labs are in the computer and she can come anytime next week for these.

## 2011-09-27 ENCOUNTER — Other Ambulatory Visit (INDEPENDENT_AMBULATORY_CARE_PROVIDER_SITE_OTHER): Payer: Medicare Other

## 2011-09-27 DIAGNOSIS — D126 Benign neoplasm of colon, unspecified: Secondary | ICD-10-CM

## 2011-09-27 DIAGNOSIS — M899 Disorder of bone, unspecified: Secondary | ICD-10-CM

## 2011-09-27 DIAGNOSIS — R002 Palpitations: Secondary | ICD-10-CM

## 2011-09-27 DIAGNOSIS — R609 Edema, unspecified: Secondary | ICD-10-CM

## 2011-09-27 DIAGNOSIS — M949 Disorder of cartilage, unspecified: Secondary | ICD-10-CM

## 2011-09-27 DIAGNOSIS — D649 Anemia, unspecified: Secondary | ICD-10-CM

## 2011-09-27 DIAGNOSIS — E7800 Pure hypercholesterolemia, unspecified: Secondary | ICD-10-CM

## 2011-09-27 LAB — CBC WITH DIFFERENTIAL/PLATELET
Basophils Absolute: 0 10*3/uL (ref 0.0–0.1)
Basophils Relative: 0.6 % (ref 0.0–3.0)
Eosinophils Absolute: 0.2 10*3/uL (ref 0.0–0.7)
Eosinophils Relative: 2.3 % (ref 0.0–5.0)
HCT: 35.1 % — ABNORMAL LOW (ref 36.0–46.0)
Hemoglobin: 11.8 g/dL — ABNORMAL LOW (ref 12.0–15.0)
Lymphocytes Relative: 29.5 % (ref 12.0–46.0)
Lymphs Abs: 2.4 10*3/uL (ref 0.7–4.0)
MCHC: 33.7 g/dL (ref 30.0–36.0)
MCV: 93.3 fl (ref 78.0–100.0)
Monocytes Absolute: 0.8 10*3/uL (ref 0.1–1.0)
Monocytes Relative: 9.4 % (ref 3.0–12.0)
Neutro Abs: 4.8 10*3/uL (ref 1.4–7.7)
Neutrophils Relative %: 58.2 % (ref 43.0–77.0)
Platelets: 275 10*3/uL (ref 150.0–400.0)
RBC: 3.76 Mil/uL — ABNORMAL LOW (ref 3.87–5.11)
RDW: 13.1 % (ref 11.5–14.6)
WBC: 8.3 10*3/uL (ref 4.5–10.5)

## 2011-09-27 LAB — URINALYSIS
Bilirubin Urine: NEGATIVE
Hgb urine dipstick: NEGATIVE
Ketones, ur: NEGATIVE
Leukocytes, UA: NEGATIVE
Nitrite: NEGATIVE
Specific Gravity, Urine: 1.015 (ref 1.000–1.030)
Total Protein, Urine: NEGATIVE
Urine Glucose: NEGATIVE
Urobilinogen, UA: 0.2 (ref 0.0–1.0)
pH: 6.5 (ref 5.0–8.0)

## 2011-09-27 LAB — HEPATIC FUNCTION PANEL
AST: 18 U/L (ref 0–37)
Albumin: 3.7 g/dL (ref 3.5–5.2)
Alkaline Phosphatase: 43 U/L (ref 39–117)
Bilirubin, Direct: 0 mg/dL (ref 0.0–0.3)
Total Protein: 7 g/dL (ref 6.0–8.3)

## 2011-09-27 LAB — BASIC METABOLIC PANEL
CO2: 26 mEq/L (ref 19–32)
GFR: 51.31 mL/min — ABNORMAL LOW (ref >60.00)
Glucose, Bld: 94 mg/dL (ref 70–99)
Potassium: 4.4 mEq/L (ref 3.5–5.1)
Sodium: 139 mEq/L (ref 135–145)

## 2011-09-27 LAB — TSH: TSH: 4.27 u[IU]/mL (ref 0.35–5.50)

## 2011-09-27 LAB — LIPID PANEL: Total CHOL/HDL Ratio: 2

## 2011-09-29 ENCOUNTER — Encounter: Payer: Self-pay | Admitting: Pulmonary Disease

## 2011-09-29 ENCOUNTER — Ambulatory Visit (INDEPENDENT_AMBULATORY_CARE_PROVIDER_SITE_OTHER): Payer: Medicare Other | Admitting: Pulmonary Disease

## 2011-09-29 DIAGNOSIS — D126 Benign neoplasm of colon, unspecified: Secondary | ICD-10-CM

## 2011-09-29 DIAGNOSIS — M069 Rheumatoid arthritis, unspecified: Secondary | ICD-10-CM

## 2011-09-29 DIAGNOSIS — M949 Disorder of cartilage, unspecified: Secondary | ICD-10-CM

## 2011-09-29 DIAGNOSIS — K219 Gastro-esophageal reflux disease without esophagitis: Secondary | ICD-10-CM

## 2011-09-29 DIAGNOSIS — I059 Rheumatic mitral valve disease, unspecified: Secondary | ICD-10-CM

## 2011-09-29 DIAGNOSIS — E7800 Pure hypercholesterolemia, unspecified: Secondary | ICD-10-CM

## 2011-09-29 DIAGNOSIS — K5730 Diverticulosis of large intestine without perforation or abscess without bleeding: Secondary | ICD-10-CM

## 2011-09-29 DIAGNOSIS — M899 Disorder of bone, unspecified: Secondary | ICD-10-CM

## 2011-09-29 DIAGNOSIS — I4891 Unspecified atrial fibrillation: Secondary | ICD-10-CM

## 2011-09-29 DIAGNOSIS — D649 Anemia, unspecified: Secondary | ICD-10-CM

## 2011-09-29 DIAGNOSIS — R002 Palpitations: Secondary | ICD-10-CM

## 2011-09-29 NOTE — Patient Instructions (Signed)
Today we updated your med list in our EPIC system...    Continue your current medications the same...  We gave you a copy of your recent blood work...  Stay as active as possible...  Call for any questions...  Let's plan a follow up visit in 6 month's time.Marland KitchenMarland Kitchen

## 2011-09-30 ENCOUNTER — Ambulatory Visit: Payer: Medicare Other | Admitting: Pulmonary Disease

## 2011-10-10 ENCOUNTER — Encounter: Payer: Self-pay | Admitting: Pulmonary Disease

## 2011-10-10 NOTE — Progress Notes (Signed)
Subjective:    Patient ID: Bailey [name], female    DOB: Jul 30, 1933, 77 y.o.   MRN: [id number]  HPI  77 y/o WF here for a follow up visit... she has multiple medical problems as noted below...    ~  February 17, 2010:  she reports doing well- exerc at gym 2-3d/wk... notes that [name] told her she has a form of RA that affects the systemic organs, but her prev Raynaud's symptoms have resolved... she still uses Restasis for dry eyes... due for TDAP vaccine today...  ~  August 20, 2010:  6 mo ROV- doing well overall w/ busy summer & traveling alot... c/o fatigue, saw [name] 8/11 for f/u Sjogren's, OA, on Voltaren gel Prn (offered shot, pt declined)... she saw [facility] 7/11 f/u palpit, hx PVC, MVP, mild AI- on low dose Coreg & stable, no changes made... she had f/u [name] as well & she informs me he did BMD & "it's stable"... recent fasting blood work reviewed w/ pt & copy given... OK Flu vaccine today.  ~  Mar 30, 2011:  48mo ROV & she reports doing satis, much rheumatic complaints but all stable;  She saw [name] 2/12> remains stable, symptoms not severe enough for dis modifying agents;  We reviewed last labs 9/11- all OK;  Going to [place] this summer...  ~  September 29, 2011:  34mo ROV & they had a great trip to [place], visited family, etc;  No new complaints or concerns today other than her c/o fatigue (no change);  She's already had the 2012 flu vaccine, & we reviewed her recent lab work & gave her a copy...    HxMVP, Hx mildAI, Palpit, HxPAFib> on ASA325, Coreg3.125/d; she saw DrNishan in f/u 7/12 & doing well on the BBlocker- no CP, palpit, dizzy, etc; 2DEcho was stable- no change & meds kept the same...     CHOL> on Fish Oil + diet; FLP looks good w/ all parameters at goal...    GI> GERD, Divertics, IBS, Polyps> she had HHrepair yrs ago & uses Prilosec OTC as needed; denies active symptoms at present; colon due 8/13 per DrDBrodie...    ORTHO> RA, Raynaud's, Sjogren's, OA, right  shoulder impingement> she is followed by [name] & had right shoulder surg by DrCaffrey; take Naproxen 220mg  as needed...    Osteopenia> on calcium, MVI, Vit D; [name] does her BMDs & follows this problem...    Anemia> Labs showed Hg= 11.8, MCV= 93; she has not seen any bleeding etc; last colon 2006 & f/u due 8/13 per DrDBrodie...    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  ALLERGY (ICD-995.3) - stable w/ OTC meds Prn... she saw DrJacobs in 2006 for sinusitis.  PALPITATIONS (ICD-785.1) - Cardiac eval DrNishan- now on COREG 3.125mg  Qam... ~  notes from [place] & DrAllred reviewed> followed for palpit, hx PVCs, hx PAF, mild MVP, mild AI.  Hx of MITRAL VALVE PROLAPSE & MILD AI  Hx of PAROXYSMAL ATRIAL FIBRILLATION - on ASA 325mg /d... she denies CP, notes occas "heavy" feeling, rare palpit, she is very active without difficulty... ~  2DEcho 12/09 did not show MVP- norm LVF, EF= 60%, no regional abn, mild calcif AoV w/ mild AI, mild MR, incr PA sys 37-41 range... ~  2DEcho 8/12  showed norm LVF w/ EF=60-65% & no wall motion abn; Gr1DD, mild AI, trivial MR, otherw wnl & RVsys=28...  HYPERCHOLESTEROLEMIA, BORDERLINE (ICD-272.0) - on diet + FishOil... but she has a high HDL in the 75-105 range. ~  FLP 6/08 showed TChol 215, TG 78, HDL 99, LDL 88 ~  FLP 10/09 showed TChol 196, TG 100, HDL 82, LDL 94 ~  FLP 9/11 showed TChol 189, TG 73, HDL 84, LDL 90 ~  FLP 10/12 showed TChol 175, TG 64, HDL 86, LDL 76  GERD (ICD-530.81) - she had a HH repair in [place] 40+ yrs ago... occas takes OTC Prilosec Prn now...  DIVERTICULITIS, COLON (ICD-562.11) IRRITABLE BOWEL SYNDROME (ICD-564.1) COLONIC POLYPS (ICD-211.3) - last colonoscopy 8/06 showed divertics only, no recurrent polyps... f/u due 8/13 per DrDBrodie's note...  last polyp removed  8/03= adenomatous...  ARTHRITIS, RHEUMATOID (ICD-714.0) - Rheumatology eval by [name]> she has Raynaud's, Sjogren's, OA, & right shoulder impingement syndrome...  ~  s/p right shoulder surg 2009 by DrCaffrey... ~  9/10:  she also describes Raynaud's phenomenon & has dry eye ?sicca syndrome?... ~  10/10:  she had lat epicondylitis right elbow... ~  8/12: f/u Rheum OA, SICCA syndrome, Raynaud's, bursitis; she uses Aleve & OA supplements...  ? of FIBROMYALGIA (ICD-729.1) - she states she's had this "all my life" yet she is very active and notes that "exercises help"...  OSTEOPENIA (ICD-733.90) - on Calcium supplements and Vit D OTC... her BMD's are done by [name] & pt reports that last BMD in 2011 was "stable"... Vit D level 9/11 = 52...  Health Maintenance -  she sees [name] for GYN- he does her PAP smears, Mammograms, and BMD's... she tells me that her VitD level per GYN was OK and she takes MVI + extra D OTC... she is on [facility] per Gyn... she got the Shingles vaccine at health dept in 2008... 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 ~  IMMUNIZATIONS:  she had PNEUMOVAX 2008 @ age 67... she gets yearly Flu vaccine... OK TDAP 3/11 here...   Past Surgical History  Procedure Date  . Cholecystectomy   . Appendectomy   . Hiatal hernia repair   . Right shoulder sugery 2009    by Dr. Madelon Lips    Outpatient Encounter Prescriptions as of 09/29/2011  Medication Sig Dispense Refill  . acyclovir (ZOVIRAX) 800 MG tablet Take as directed when pt has an outbreak       . aspirin 325 MG tablet Take 325 mg by mouth daily.        . carvedilol (COREG) 3.125 MG tablet Take 1 tablet (3.125 mg total) by mouth as directed. Take one tablet by mouth daily as directed  90 tablet  0  . Cholecalciferol (VITAMIN D) 400 UNITS capsule Take 2 capsules by mouth daily       . cycloSPORINE (RESTASIS) 0.05 % ophthalmic emulsion 1 drop 2 (two) times daily.        . fish oil-omega-3 fatty acids  1000 MG capsule Take one capsule by mouth two times daily      . naproxen sodium (ANAPROX) 220 MG tablet Take 220 mg by mouth daily.       Marland Kitchen PREMPRO 0.45-1.5 MG per tablet TAKE 1 TABLET BY MOUTH EVERY DAY  84 tablet  4  . DISCONTD: calcipotriene-betamethasone (TACLONEX) ointment Apply topically at bedtime.        Marland Kitchen DISCONTD: Clobetasol Propionate (CLOBEX SPRAY EX) Apply topically every morning.  Allergies  Allergen Reactions  . Diamox (Acetazolamide) Nausea Only  . Erythromycin     REACTION: severe nausea  . Penicillins     REACTION: unsure of reaction  . Sulfonamide Derivatives     REACTION: nausea-elevated fever--kidney problems    Current Medications, Allergies, Past Medical History, Past Surgical History, Family History, and Social History were reviewed in [employer] record.     Review of Systems         See HPI - all other systems neg except as noted...  The patient denies anorexia, fever, weight loss, weight gain, vision loss, decreased hearing, hoarseness, chest pain, syncope, dyspnea on exertion, peripheral edema, prolonged cough, headaches, hemoptysis, abdominal pain, melena, hematochezia, severe indigestion/heartburn, hematuria, incontinence, muscle weakness, suspicious skin lesions, transient blindness, difficulty walking, depression, unusual weight change, abnormal bleeding, enlarged lymph nodes, and angioedema.    Objective:   Physical Exam    WD, WN, 77 y/o WF in NAD... GENERAL:  Alert & oriented; pleasant & cooperative... HEENT:  /AT, EOM-wnl, PERRLA, EACs-clear, TMs-wnl, NOSE-clear, THROAT-clear & wnl. NECK:  Supple w/ fairROM; no JVD; normal carotid impulses w/o bruits; no thyromegaly or nodules palpated; no lymphadenopathy. CHEST:  Clear to P & A; without wheezes/ rales/ or rhonchi. HEART:  sl irreg w/ PB's noted; without murmurs/ rubs/ or gallops heard... ABDOMEN:  Soft & nontender; normal bowel sounds; no organomegaly or masses  detected. EXT: without deformities, mild arthritic changes; no varicose veins/ venous insuffic/ or edema. NEURO:  CN's intact; motor testing normal; sensory testing normal; gait normal & balance OK. DERM:  No lesions noted; no rash etc...   Assessment & Plan:    Sjogren's syndrome>  She uses Restasis, keeps lozenges & water handy, etc...  MVP, Mild AI, HxPAF>  Hx palpit controlled w/ Coreg 3.125mg  once daily, no CP/SOB/ etc...  LIPIDS>  On diet + fish oil, she has excellent HDL & all parameters are WNL on diet rx...  GI>  GERD, Divertics, IBS, Polyps> she is up to date & doing satis...  RHEUM>  Followed by Rober Minion, notes reviewed, continue OTC meds Prn...  Other medical issues as noted.Marland KitchenMarland Kitchen

## 2011-11-09 ENCOUNTER — Other Ambulatory Visit: Payer: Self-pay | Admitting: *Deleted

## 2011-11-09 MED ORDER — CARVEDILOL 3.125 MG PO TABS: 3.1250 mg | ORAL_TABLET | ORAL | Status: DC

## 2011-11-30 HISTORY — PX: OTHER SURGICAL HISTORY: SHX169

## 2011-12-06 ENCOUNTER — Other Ambulatory Visit: Payer: Self-pay | Admitting: Dermatology

## 2011-12-06 DIAGNOSIS — B351 Tinea unguium: Secondary | ICD-10-CM | POA: Diagnosis not present

## 2011-12-06 DIAGNOSIS — D0439 Carcinoma in situ of skin of other parts of face: Secondary | ICD-10-CM | POA: Diagnosis not present

## 2011-12-06 DIAGNOSIS — L578 Other skin changes due to chronic exposure to nonionizing radiation: Secondary | ICD-10-CM | POA: Diagnosis not present

## 2011-12-06 DIAGNOSIS — C44320 Squamous cell carcinoma of skin of unspecified parts of face: Secondary | ICD-10-CM | POA: Diagnosis not present

## 2011-12-30 DIAGNOSIS — Z961 Presence of intraocular lens: Secondary | ICD-10-CM | POA: Diagnosis not present

## 2011-12-30 DIAGNOSIS — H04129 Dry eye syndrome of unspecified lacrimal gland: Secondary | ICD-10-CM | POA: Diagnosis not present

## 2012-01-04 DIAGNOSIS — M25519 Pain in unspecified shoulder: Secondary | ICD-10-CM | POA: Diagnosis not present

## 2012-01-04 DIAGNOSIS — M25529 Pain in unspecified elbow: Secondary | ICD-10-CM | POA: Diagnosis not present

## 2012-01-04 DIAGNOSIS — M25549 Pain in joints of unspecified hand: Secondary | ICD-10-CM | POA: Diagnosis not present

## 2012-01-04 DIAGNOSIS — M3500 Sicca syndrome, unspecified: Secondary | ICD-10-CM | POA: Diagnosis not present

## 2012-04-04 ENCOUNTER — Ambulatory Visit: Payer: Medicare Other | Admitting: Pulmonary Disease

## 2012-04-18 ENCOUNTER — Encounter: Payer: Self-pay | Admitting: Internal Medicine

## 2012-04-28 ENCOUNTER — Encounter: Payer: Self-pay | Admitting: Internal Medicine

## 2012-04-28 DIAGNOSIS — L738 Other specified follicular disorders: Secondary | ICD-10-CM | POA: Diagnosis not present

## 2012-04-28 DIAGNOSIS — Z85828 Personal history of other malignant neoplasm of skin: Secondary | ICD-10-CM | POA: Diagnosis not present

## 2012-05-01 ENCOUNTER — Ambulatory Visit (INDEPENDENT_AMBULATORY_CARE_PROVIDER_SITE_OTHER): Payer: Medicare Other | Admitting: Pulmonary Disease

## 2012-05-01 ENCOUNTER — Ambulatory Visit: Payer: Medicare Other | Admitting: Pulmonary Disease

## 2012-05-01 ENCOUNTER — Encounter: Payer: Self-pay | Admitting: Pulmonary Disease

## 2012-05-01 VITALS — BP 102/58 | HR 65 | Temp 97.0°F | Ht 62.0 in | Wt 116.0 lb

## 2012-05-01 DIAGNOSIS — M3500 Sicca syndrome, unspecified: Secondary | ICD-10-CM

## 2012-05-01 DIAGNOSIS — I4891 Unspecified atrial fibrillation: Secondary | ICD-10-CM

## 2012-05-01 DIAGNOSIS — D126 Benign neoplasm of colon, unspecified: Secondary | ICD-10-CM

## 2012-05-01 DIAGNOSIS — K589 Irritable bowel syndrome without diarrhea: Secondary | ICD-10-CM

## 2012-05-01 DIAGNOSIS — M899 Disorder of bone, unspecified: Secondary | ICD-10-CM

## 2012-05-01 DIAGNOSIS — R002 Palpitations: Secondary | ICD-10-CM | POA: Diagnosis not present

## 2012-05-01 DIAGNOSIS — E7800 Pure hypercholesterolemia, unspecified: Secondary | ICD-10-CM

## 2012-05-01 DIAGNOSIS — I059 Rheumatic mitral valve disease, unspecified: Secondary | ICD-10-CM | POA: Diagnosis not present

## 2012-05-01 DIAGNOSIS — IMO0001 Reserved for inherently not codable concepts without codable children: Secondary | ICD-10-CM

## 2012-05-01 DIAGNOSIS — M069 Rheumatoid arthritis, unspecified: Secondary | ICD-10-CM

## 2012-05-01 DIAGNOSIS — K219 Gastro-esophageal reflux disease without esophagitis: Secondary | ICD-10-CM

## 2012-05-01 DIAGNOSIS — D649 Anemia, unspecified: Secondary | ICD-10-CM

## 2012-05-01 DIAGNOSIS — K5730 Diverticulosis of large intestine without perforation or abscess without bleeding: Secondary | ICD-10-CM

## 2012-05-01 DIAGNOSIS — M949 Disorder of cartilage, unspecified: Secondary | ICD-10-CM

## 2012-05-01 NOTE — Progress Notes (Signed)
Subjective:    Patient ID: Bailey [name], female    DOB: 01-12-1933, 78 y.o.   MRN: [id number]  HPI 78 y/o WF here for a follow up visit... she has multiple medical problems as noted below...    ~  August 20, 2010:  6 mo ROV- doing well overall w/ busy summer & traveling alot... c/o fatigue, saw [name] 8/11 for f/u Sjogren's, OA, on Voltaren gel Prn (offered shot, pt declined)... she saw [facility] 7/11 f/u palpit, hx PVC, MVP, mild AI- on low dose Coreg & stable, no changes made... she had f/u [name] as well & she informs me he did BMD & "it's stable"... recent fasting blood work reviewed w/ pt & copy given... OK Flu vaccine today.  ~  Mar 30, 2011:  533mo ROV & she reports doing satis, much rheumatic complaints but all stable;  She saw [name] 2/12> remains stable, symptoms not severe enough for dis modifying agents;  We reviewed last labs 9/11- all OK;  Going to [place] this summer...  ~  September 29, 2011:  93mo ROV & they had a great trip to [place], visited family, etc;  No new complaints or concerns today other than her c/o fatigue (no change);  She's already had the 2012 flu vaccine, & we reviewed her recent lab work & gave her a copy...    HxMVP, Hx mildAI, Palpit, HxPAFib> on ASA325, Coreg3.125/d; she saw DrNishan in f/u 7/12 & doing well on the BBlocker- no CP, palpit, dizzy, etc; 2DEcho was stable- no change & meds kept the same...     CHOL> on Fish Oil + diet; FLP looks good w/ all parameters at goal...    GI> GERD, Divertics, IBS, Polyps> she had HHrepair yrs ago & uses Prilosec OTC as needed; denies active symptoms at present; colon due 8/13 per DrDBrodie...    ORTHO> RA, Raynaud's, Sjogren's, OA, right shoulder impingement> she is followed by [name] & had right shoulder surg by DrCaffrey; take Naproxen 220mg  as needed...    Osteopenia> on calcium, MVI, Vit D; [name] does her BMDs & follows this problem...    Anemia> Labs showed Hg= 11.8, MCV= 93; she has not seen  any bleeding etc; last colon 2006 & f/u due 8/13 per DrDBrodie...    Other> she uses Acyclovir as needed for outbreaks;  She take Prempro per GYN;  She uses Restasis for her dry eyes;    ~  May 01, 2012:  533mo ROV & Bailey [name] is doing well- no new complaints or concerns; she had a wonderful trip to [place] to visit relatives & wedding of a cousin's son; her granddaughter is getting married in [place] here & she is very busy... 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  ALLERGY (ICD-995.3) - stable w/ OTC meds Prn... she saw DrJacobs in 2006 for sinusitis. ~  CXR 9/10 showed clear lungs, NAD, mild degen changes in spine...  PALPITATIONS (ICD-785.1) - Cardiac eval DrNishan- now on COREG 3.125mg  Qam... ~  notes from [place] & DrAllred reviewed> followed for palpit, hx PVCs, hx PAF, mild MVP, mild AI.  Hx of MITRAL VALVE PROLAPSE & MILD AI  Hx of PAROXYSMAL ATRIAL FIBRILLATION - on ASA 325mg /d... she denies CP, notes occas "heavy" feeling, rare palpit, she is very active without difficulty... ~  2DEcho 12/09 did not show MVP- norm LVF, EF=  60%, no regional abn, mild calcif AoV w/ mild AI, mild MR, incr PA sys 37-41 range... ~  2DEcho 8/12 showed norm LVF w/ EF=60-65% & no wall motion abn; Gr1DD, mild AI, trivial MR, otherw wnl & RVsys=28...  HYPERCHOLESTEROLEMIA, BORDERLINE (ICD-272.0) - on diet + FishOil... but she has a high HDL in the 75-105 range. ~  FLP 6/08 showed TChol 215, TG 78, HDL 99, LDL 88 ~  FLP 10/09 showed TChol 196, TG 100, HDL 82, LDL 94 ~  FLP 9/11 showed TChol 189, TG 73, HDL 84, LDL 90 ~  FLP 10/12 showed TChol 175, TG 64, HDL 86, LDL 76  GERD (ICD-530.81) - she had a HH repair in [place] 40+ yrs ago... occas takes OTC Prilosec Prn now...  DIVERTICULITIS, COLON (ICD-562.11) IRRITABLE BOWEL SYNDROME (ICD-564.1) COLONIC POLYPS (ICD-211.3) - last colonoscopy 8/06 showed divertics  only, no recurrent polyps... f/u due 8/13 per DrDBrodie's note...  last polyp removed 8/03= adenomatous...  ARTHRITIS, RHEUMATOID (ICD-714.0) - Rheumatology eval by [name]> she has Raynaud's, Sjogren's, OA, & right shoulder impingement syndrome...  ~  s/p right shoulder surg 2009 by DrCaffrey... ~  9/10:  she also describes Raynaud's phenomenon & has dry eye ?sicca syndrome?... ~  10/10:  she had lat epicondylitis right elbow... ~  8/12: f/u Rheum OA, SICCA syndrome, Raynaud's, bursitis; she uses Aleve & OA supplements...  ? of FIBROMYALGIA (ICD-729.1) - she states she's had this "all my life" yet she is very active and notes that "exercises help"...  OSTEOPENIA (ICD-733.90) - on Calcium supplements and Vit D OTC... her BMD's are done by [name] & pt reports that last BMD in 2011 was "stable"... Vit D level 9/11 = 52...  Health Maintenance -  she sees [name] for GYN- he does her PAP smears, Mammograms, and BMD's... she tells me that her VitD level per GYN was OK and she takes MVI + extra D OTC... she is on [facility] per Gyn... she got the Shingles vaccine at health dept in 2008... 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 ~  IMMUNIZATIONS:  she had PNEUMOVAX 2008 @ age 58... she gets yearly Flu vaccine... OK TDAP 3/11 here...   Past Surgical History  Procedure Date  . Cholecystectomy   . Appendectomy   . Hiatal hernia repair   . Right shoulder sugery 2009    by Dr. Madelon Lips    Outpatient Encounter Prescriptions as of 05/01/2012  Medication Sig Dispense Refill  . acyclovir (ZOVIRAX) 800 MG tablet Take as directed when pt has an outbreak       . aspirin 325 MG tablet Take 325 mg by mouth daily.        . carvedilol (COREG) 3.125 MG tablet Take 1 tablet (3.125 mg total) by mouth as directed. Take one tablet by mouth daily as directed  90 tablet  3  . Cholecalciferol (VITAMIN D) 400 UNITS capsule Take 2 capsules by mouth daily       . cycloSPORINE (RESTASIS) 0.05 %  ophthalmic emulsion 1 drop 2 (two) times daily.        . fish oil-omega-3 fatty acids 1000 MG capsule Take one capsule by mouth two times daily      . naproxen sodium (ANAPROX) 220 MG tablet Take 220 mg by mouth daily.       Marland Kitchen PREMPRO 0.45-1.5 MG per tablet TAKE 1 TABLET BY MOUTH EVERY DAY  84 tablet  4    Allergies  Allergen Reactions  . Diamox (  Acetazolamide) Nausea Only  . Erythromycin     REACTION: severe nausea  . Penicillins     REACTION: unsure of reaction  . Sulfonamide Derivatives     REACTION: nausea-elevated fever--kidney problems    Current Medications, Allergies, Past Medical History, Past Surgical History, Family History, and Social History were reviewed in [employer] record.     Review of Systems         See HPI - all other systems neg except as noted...  The patient denies anorexia, fever, weight loss, weight gain, vision loss, decreased hearing, hoarseness, chest pain, syncope, dyspnea on exertion, peripheral edema, prolonged cough, headaches, hemoptysis, abdominal pain, melena, hematochezia, severe indigestion/heartburn, hematuria, incontinence, muscle weakness, suspicious skin lesions, transient blindness, difficulty walking, depression, unusual weight change, abnormal bleeding, enlarged lymph nodes, and angioedema.    Objective:   Physical Exam    WD, WN, 78 y/o WF in NAD... GENERAL:  Alert & oriented; pleasant & cooperative... HEENT:  [place]/AT, EOM-wnl, PERRLA, EACs-clear, TMs-wnl, NOSE-clear, THROAT-clear & wnl. NECK:  Supple w/ fairROM; no JVD; normal carotid impulses w/o bruits; no thyromegaly or nodules palpated; no lymphadenopathy. CHEST:  Clear to P & A; without wheezes/ rales/ or rhonchi. HEART:  sl irreg w/ PB's noted; without murmurs/ rubs/ or gallops heard... ABDOMEN:  Soft & nontender; normal bowel sounds; no organomegaly or masses detected. EXT: without deformities, mild arthritic changes; no varicose veins/ venous insuffic/ or  edema. NEURO:  CN's intact; motor testing normal; sensory testing normal; gait normal & balance OK. DERM:  No lesions noted; no rash etc...   Assessment & Plan:    Sjogren's syndrome>  She uses Restasis, keeps lozenges & water handy, etc...  MVP, Mild AI, HxPAF>  Hx palpit controlled w/ Coreg 3.125mg  once daily, no CP/SOB/ etc...  LIPIDS>  On diet + fish oil, she has excellent HDL & all parameters are WNL on diet rx...  GI>  GERD, Divertics, IBS, Polyps> she is up to date & doing satis...  RHEUM>  Followed by Rober Minion, notes reviewed, continue OTC meds Prn...  Other medical issues as noted...   Patient's Medications  New Prescriptions   No medications on file  Previous Medications   ACYCLOVIR (ZOVIRAX) 800 MG TABLET    Take as directed when pt has an outbreak    ASPIRIN 325 MG TABLET    Take 325 mg by mouth daily.     CARVEDILOL (COREG) 3.125 MG TABLET    Take 1 tablet (3.125 mg total) by mouth as directed. Take one tablet by mouth daily as directed   CHOLECALCIFEROL (VITAMIN D) 400 UNITS CAPSULE    Take 2 capsules by mouth daily    CYCLOSPORINE (RESTASIS) 0.05 % OPHTHALMIC EMULSION    1 drop 2 (two) times daily.     FISH OIL-OMEGA-3 FATTY ACIDS 1000 MG CAPSULE    Take one capsule by mouth two times daily   NAPROXEN SODIUM (ANAPROX) 220 MG TABLET    Take 220 mg by mouth daily.    PREMPRO 0.45-1.5 MG PER TABLET    TAKE 1 TABLET BY MOUTH EVERY DAY  Modified Medications   No medications on file  Discontinued Medications   No medications on file

## 2012-05-01 NOTE — Patient Instructions (Signed)
Today we updated your med list in our EPIC system...    Continue your current medications the same...  Try NOT to overdo it!!!  Call for any problems or if we can be of service in any way...  Let's plan a follow up appt in 6 months w/ CXR & FASTING blood work.Marland KitchenMarland Kitchen

## 2012-06-05 DIAGNOSIS — Z1231 Encounter for screening mammogram for malignant neoplasm of breast: Secondary | ICD-10-CM | POA: Diagnosis not present

## 2012-06-06 ENCOUNTER — Other Ambulatory Visit: Payer: Self-pay

## 2012-06-06 DIAGNOSIS — Z8739 Personal history of other diseases of the musculoskeletal system and connective tissue: Secondary | ICD-10-CM

## 2012-06-09 ENCOUNTER — Encounter: Payer: Self-pay | Admitting: Obstetrics and Gynecology

## 2012-06-26 DIAGNOSIS — M899 Disorder of bone, unspecified: Secondary | ICD-10-CM | POA: Diagnosis not present

## 2012-06-26 DIAGNOSIS — M949 Disorder of cartilage, unspecified: Secondary | ICD-10-CM | POA: Diagnosis not present

## 2012-06-27 ENCOUNTER — Other Ambulatory Visit: Payer: Self-pay | Admitting: Obstetrics and Gynecology

## 2012-06-27 DIAGNOSIS — Z8739 Personal history of other diseases of the musculoskeletal system and connective tissue: Secondary | ICD-10-CM

## 2012-06-28 ENCOUNTER — Encounter: Payer: Self-pay | Admitting: Gynecology

## 2012-06-29 ENCOUNTER — Ambulatory Visit (AMBULATORY_SURGERY_CENTER): Payer: Medicare Other | Admitting: *Deleted

## 2012-06-29 VITALS — Ht 63.0 in | Wt 115.0 lb

## 2012-06-29 DIAGNOSIS — Z1211 Encounter for screening for malignant neoplasm of colon: Secondary | ICD-10-CM

## 2012-06-29 MED ORDER — MOVIPREP 100 G PO SOLR: ORAL | Status: DC

## 2012-07-04 DIAGNOSIS — M25549 Pain in joints of unspecified hand: Secondary | ICD-10-CM | POA: Diagnosis not present

## 2012-07-04 DIAGNOSIS — M7710 Lateral epicondylitis, unspecified elbow: Secondary | ICD-10-CM | POA: Diagnosis not present

## 2012-07-04 DIAGNOSIS — M3500 Sicca syndrome, unspecified: Secondary | ICD-10-CM | POA: Diagnosis not present

## 2012-07-06 ENCOUNTER — Ambulatory Visit (INDEPENDENT_AMBULATORY_CARE_PROVIDER_SITE_OTHER): Payer: Medicare Other | Admitting: Obstetrics and Gynecology

## 2012-07-06 ENCOUNTER — Encounter: Payer: Self-pay | Admitting: Obstetrics and Gynecology

## 2012-07-06 VITALS — BP 124/70 | Ht 62.0 in | Wt 116.0 lb

## 2012-07-06 DIAGNOSIS — R350 Frequency of micturition: Secondary | ICD-10-CM | POA: Diagnosis not present

## 2012-07-06 DIAGNOSIS — M949 Disorder of cartilage, unspecified: Secondary | ICD-10-CM

## 2012-07-06 DIAGNOSIS — M899 Disorder of bone, unspecified: Secondary | ICD-10-CM | POA: Diagnosis not present

## 2012-07-06 DIAGNOSIS — N952 Postmenopausal atrophic vaginitis: Secondary | ICD-10-CM

## 2012-07-06 DIAGNOSIS — Z780 Asymptomatic menopausal state: Secondary | ICD-10-CM

## 2012-07-06 DIAGNOSIS — M8580 Other specified disorders of bone density and structure, unspecified site: Secondary | ICD-10-CM

## 2012-07-06 DIAGNOSIS — N393 Stress incontinence (female) (male): Secondary | ICD-10-CM

## 2012-07-06 MED ORDER — CONJ ESTROG-MEDROXYPROGEST ACE 0.45-1.5 MG PO TABS: 1.0000 | ORAL_TABLET | Freq: Every day | ORAL | Status: DC

## 2012-07-06 NOTE — Patient Instructions (Signed)
Continue yearly mammograms. Continue periodic bone densities. Have Dr. Kriste Basque  to recheck your vitamin D level.

## 2012-07-06 NOTE — Progress Notes (Signed)
Patient came to see me today for further followup. Remains on Prempro 0.45 mg for control of vasomotor symptoms and atrophic vaginitis. She feels very good on it and does not want to stop it. She is having no vaginal bleeding. She is having no pelvic pain. She is well aware of the risks of HRT. She does have some urinary frequency and when she exercises occasional stress incontinence. It is tolerable without surgical intervention. She does not have dysuria or urgency. She has osteopenia on bone density. Her bone density was just done and is stable from 2011. However her fracture risk for hip fracture is now 2.9%. She takes calcium and vitamin D. Her PCP has measured her vitamin D. She has had no fractures. She has always had normal Pap smears. Her last Pap smear was 2012.  ROS: 12 system review done. Pertinent positives above. Other positives include fatigue, rheumatoid arthritis, dry eye syndrome, diverticulosis, hyperlipidemia.  Physical examination: Kennon Portela present. HEENT within normal limits. Neck: Thyroid not large. No masses. Supraclavicular nodes: not enlarged. Breasts: Examined in both sitting and lying  position. No skin changes and no masses. Abdomen: Soft no guarding rebound or masses or hernia. Pelvic: External: Within normal limits. BUS: Within normal limits. Vaginal:within normal limits. Good estrogen effect. No evidence of cystocele rectocele or enterocele. Cervix: clean. Uterus: Normal size and shape. Adnexa: No masses. Rectovaginal exam: Confirmatory and negative. Extremities: Within normal limits.  Assessment: #1. Menopausal symptoms #2. Osteopenia #3. Urinary frequency and stress incontinence  Plan: We her usual discussion about risks of HRT including a higher risk of breast cancer, DVT and stroke. Patient understands and wants to continue. Her PCP has her on a full aspirin daily. We discussed her bone density and I offerred  her Fosamax due to her hip fracture risk. She will  consider and  inform. We discussed that Prempro also gives her benefit for her bones.The new Pap smear guidelines were discussed with the patient. No pap done

## 2012-07-07 LAB — URINALYSIS W MICROSCOPIC + REFLEX CULTURE
Casts: NONE SEEN
Crystals: NONE SEEN
Glucose, UA: NEGATIVE mg/dL
Ketones, ur: NEGATIVE mg/dL
Specific Gravity, Urine: 1.03 (ref 1.005–1.030)
Urobilinogen, UA: 0.2 mg/dL (ref 0.0–1.0)

## 2012-07-14 ENCOUNTER — Encounter: Payer: Self-pay | Admitting: Internal Medicine

## 2012-07-14 ENCOUNTER — Ambulatory Visit (AMBULATORY_SURGERY_CENTER): Payer: Medicare Other | Admitting: Internal Medicine

## 2012-07-14 VITALS — BP 174/77 | HR 80 | Temp 97.8°F | Resp 18 | Ht 62.0 in | Wt 116.0 lb

## 2012-07-14 DIAGNOSIS — Z86010 Personal history of colonic polyps: Secondary | ICD-10-CM

## 2012-07-14 DIAGNOSIS — Z1211 Encounter for screening for malignant neoplasm of colon: Secondary | ICD-10-CM

## 2012-07-14 DIAGNOSIS — I059 Rheumatic mitral valve disease, unspecified: Secondary | ICD-10-CM | POA: Diagnosis not present

## 2012-07-14 DIAGNOSIS — Z8679 Personal history of other diseases of the circulatory system: Secondary | ICD-10-CM | POA: Diagnosis not present

## 2012-07-14 MED ORDER — SODIUM CHLORIDE 0.9 % IV SOLN: 500.0000 mL | INTRAVENOUS | Status: DC

## 2012-07-14 NOTE — Progress Notes (Signed)
Patient did not experience any of the following events: a burn prior to discharge; a fall within the facility; wrong site/side/patient/procedure/implant event; or a hospital transfer or hospital admission upon discharge from the facility. (G8907) Patient did not have preoperative order for IV antibiotic SSI prophylaxis. ([id number])  

## 2012-07-14 NOTE — Patient Instructions (Addendum)
Discharge instructions given with verbal understanding. Handout on diverticulosis and a high fiber diet given. Resume previous medications. YOU HAD AN ENDOSCOPIC PROCEDURE TODAY AT THE [facility]: Refer to the procedure report that was given to you for any specific questions about what was found during the examination.  If the procedure report does not answer your questions, please call your gastroenterologist to clarify.  If you requested that your care partner not be given the details of your procedure findings, then the procedure report has been included in a sealed envelope for you to review at your convenience later.  YOU SHOULD EXPECT: Some feelings of bloating in the abdomen. Passage of more gas than usual.  Walking can help get rid of the air that was put into your GI tract during the procedure and reduce the bloating. If you had a lower endoscopy (such as a colonoscopy or flexible sigmoidoscopy) you may notice spotting of blood in your stool or on the toilet paper. If you underwent a bowel prep for your procedure, then you may not have a normal bowel movement for a few days.  DIET: Your first meal following the procedure should be a light meal and then it is ok to progress to your normal diet.  A half-sandwich or bowl of soup is an example of a good first meal.  Heavy or fried foods are harder to digest and may make you feel nauseous or bloated.  Likewise meals heavy in dairy and vegetables can cause extra gas to form and this can also increase the bloating.  Drink plenty of fluids but you should avoid alcoholic beverages for 24 hours.  ACTIVITY: Your care partner should take you home directly after the procedure.  You should plan to take it easy, moving slowly for the rest of the day.  You can resume normal activity the day after the procedure however you should NOT DRIVE or use heavy machinery for 24 hours (because of the sedation medicines used during the test).    SYMPTOMS TO  REPORT IMMEDIATELY: A gastroenterologist can be reached at any hour.  During normal business hours, 8:30 AM to 5:00 PM Monday through Friday, call [phone].  After hours and on weekends, please call the GI answering service at [phone] who will take a message and have the physician on call contact you.   Following lower endoscopy (colonoscopy or flexible sigmoidoscopy):  Excessive amounts of blood in the stool  Significant tenderness or worsening of abdominal pains  Swelling of the abdomen that is new, acute  Fever of 100F or higher  FOLLOW UP: If any biopsies were taken you will be contacted by phone or by letter within the next 1-3 weeks.  Call your gastroenterologist if you have not heard about the biopsies in 3 weeks.  Our staff will call the home number listed on your records the next business day following your procedure to check on you and address any questions or concerns that you may have at that time regarding the information given to you following your procedure. This is a courtesy call and so if there is no answer at the home number and we have not heard from you through the emergency physician on call, we will assume that you have returned to your regular daily activities without incident.  SIGNATURES/CONFIDENTIALITY: You and/or your care partner have signed paperwork which will be entered into your electronic medical record.  These signatures attest to the fact that that the information above on   your After Visit Summary has been reviewed and is understood.  Full responsibility of the confidentiality of this discharge information lies with you and/or your care-partner. 

## 2012-07-14 NOTE — Op Note (Signed)
[facility] [address] [place], [place]  [address]  COLONOSCOPY PROCEDURE REPORT  PATIENT:  Bailey [name], Bailey [name]  MR#:  [id number] BIRTHDATE:  [birth year], 78 yrs. old  GENDER:  female ENDOSCOPIST:  Hedwig Morton. Juanda Chance, MD REF. BY: PROCEDURE DATE:  07/14/2012 PROCEDURE:  Colonoscopy 19147 ASA CLASS:  Class II INDICATIONS:  history of pre-cancerous (adenomatous) colon polyps adenomatous polyp 2003, last colon 2008 MEDICATIONS:   MAC sedation, administered by CRNA, propofol (Diprivan) 120 mg  DESCRIPTION OF PROCEDURE:   After the risks and benefits and of the procedure were explained, informed consent was obtained. Digital rectal exam was performed and revealed no rectal masses. The LB [id number] endoscope was introduced through the anus and advanced to the cecum, which was identified by both the appendix and ileocecal valve.  The quality of the prep was good, using MoviPrep.  The instrument was then slowly withdrawn as the colon was fully examined. <<PROCEDUREIMAGES>>  FINDINGS:  Moderate diverticulosis was found in the sigmoid colon (see image1, image2, and image7).  This was otherwise a normal examination of the colon (see image8, image6, image5, image4, and image3).   Retroflexed views in the rectum revealed no abnormalities.    The scope was then withdrawn from the patient and the procedure completed.  COMPLICATIONS:  None ENDOSCOPIC IMPRESSION: 1) Moderate diverticulosis in the sigmoid colon 2) Otherwise normal examination RECOMMENDATIONS: 1) High fiber diet.  REPEAT EXAM:  In 0 year(s) for.  no recall due to age  ______________________________ Hedwig Morton. Juanda Chance, MD  CC:  Michele Mcalpine, MD  n. Rosalie DoctorHedwig Morton. Brice Kossman at 07/14/2012 11:01 AM  Rushie Chestnut, [phone]

## 2012-07-17 ENCOUNTER — Telehealth: Payer: Self-pay | Admitting: *Deleted

## 2012-07-17 NOTE — Telephone Encounter (Signed)
Left message that we called for f/u 

## 2012-07-21 ENCOUNTER — Ambulatory Visit (INDEPENDENT_AMBULATORY_CARE_PROVIDER_SITE_OTHER): Payer: Medicare Other | Admitting: Cardiovascular Disease

## 2012-07-21 VITALS — BP 117/68 | HR 60 | Ht 62.0 in | Wt 114.0 lb

## 2012-07-21 DIAGNOSIS — I059 Rheumatic mitral valve disease, unspecified: Secondary | ICD-10-CM | POA: Diagnosis not present

## 2012-07-21 DIAGNOSIS — I4949 Other premature depolarization: Secondary | ICD-10-CM

## 2012-07-21 MED ORDER — CARVEDILOL 3.125 MG PO TABS: 3.1250 mg | ORAL_TABLET | Freq: Two times a day (BID) | ORAL | Status: DC

## 2012-07-21 NOTE — Progress Notes (Signed)
Patient ID: Bailey [name], female   DOB: Jul 09, 1933, 78 y.o.   MRN: [id number] Bailey [name] is seen today in followup for palpitations and PVCs. She saw Dr. Johney Frame in February 2011 and he agreed to continue beta blocker therapy. she's been doing better. She was contemplating not taking her p.m. dose according. She had a trip to the beach which was exciting. Her palpitations have been well under control and she has not had any significant chest pain or syncope. SHe is happy to continue with her beta blocker. Her tests at the end of 2009 showed no evidence of structural heart disease. She continues to see Dr. Nile Riggs for her eye care. She does have a history of mitral valve prolapse and mild aortic insufficiency. She had some dyspnea at the beach was was unsettling along with some LE edema. No excess salt. Edema has happened in past due to standing and ankle varicosities  Last echo only trivial MR and mild AR  ROS: Denies fever, malais, weight loss, blurry vision, decreased visual acuity, cough, sputum, SOB, hemoptysis, pleuritic pain, palpitaitons, heartburn, abdominal pain, melena, lower extremity edema, claudication, or rash.  All other systems reviewed and negative  General: Affect appropriate Healthy:  appears stated age HEENT: normal Neck supple with no adenopathy JVP normal no bruits no thyromegaly Lungs clear with no wheezing and good diaphragmatic motion Heart:  S1/S2 no murmur, no rub, gallop or click PMI normal Abdomen: benighn, BS positve, no tenderness, no AAA no bruit.  No HSM or HJR Distal pulses intact with no bruits No edema Neuro non-focal Skin warm and dry No muscular weakness   Current Outpatient Prescriptions  Medication Sig Dispense Refill  . acyclovir (ZOVIRAX) 800 MG tablet Take as directed when pt has an outbreak       . aspirin 325 MG tablet Take 325 mg by mouth daily.        . carvedilol (COREG) 3.125 MG tablet as directed. Take one tablet by mouth daily as directed      .  Cholecalciferol (VITAMIN D) 400 UNITS capsule Take 2 capsules by mouth daily       . cycloSPORINE (RESTASIS) 0.05 % ophthalmic emulsion 1 drop 2 (two) times daily.        Marland Kitchen estrogen, conjugated,-medroxyprogesterone (PREMPRO) 0.45-1.5 MG per tablet Take 1 tablet by mouth daily.  90 tablet  3  . fish oil-omega-3 fatty acids 1000 MG capsule Take one capsule by mouth two times daily      . naproxen sodium (ANAPROX) 220 MG tablet Take 220 mg by mouth daily.       Marland Kitchen DISCONTD: carvedilol (COREG) 3.125 MG tablet Take 1 tablet (3.125 mg total) by mouth as directed. Take one tablet by mouth daily as directed  90 tablet  3    Allergies  Diamox; Epinephrine-lidocaine-na metabisulfite; Erythromycin; Penicillins; and Sulfonamide derivatives  Electrocardiogram:  Assessment and Plan

## 2012-07-21 NOTE — Patient Instructions (Addendum)
Your physician recommends that you continue on your current medications as directed. Please refer to the Current Medication list given to you today.  Your physician wants you to follow-up in: 1 year with Dr. Nishan. You will receive a reminder letter in the mail two months in advance. If you don't receive a letter, please call our office to schedule the follow-up appointment.  

## 2012-07-21 NOTE — Assessment & Plan Note (Signed)
Cholesterol is at goal.  Continue current dose of statin and diet Rx.  No myalgias or side effects.  F/U  LFT's in 6 months. Lab Results  Component Value Date   LDLCALC 76 09/27/2011

## 2012-07-21 NOTE — Assessment & Plan Note (Signed)
Trivial MR on last echo with no MVP  Stable

## 2012-07-21 NOTE — Assessment & Plan Note (Signed)
Asymptomatic Normal EF  Continue low dose coreg

## 2012-08-22 ENCOUNTER — Other Ambulatory Visit: Payer: Self-pay | Admitting: Obstetrics and Gynecology

## 2012-10-23 ENCOUNTER — Other Ambulatory Visit: Payer: Self-pay | Admitting: Pulmonary Disease

## 2012-10-23 ENCOUNTER — Telehealth: Payer: Self-pay | Admitting: Pulmonary Disease

## 2012-10-23 DIAGNOSIS — D126 Benign neoplasm of colon, unspecified: Secondary | ICD-10-CM

## 2012-10-23 DIAGNOSIS — R002 Palpitations: Secondary | ICD-10-CM

## 2012-10-23 DIAGNOSIS — D649 Anemia, unspecified: Secondary | ICD-10-CM

## 2012-10-23 DIAGNOSIS — E559 Vitamin D deficiency, unspecified: Secondary | ICD-10-CM

## 2012-10-23 DIAGNOSIS — E7800 Pure hypercholesterolemia, unspecified: Secondary | ICD-10-CM

## 2012-10-23 DIAGNOSIS — I4891 Unspecified atrial fibrillation: Secondary | ICD-10-CM

## 2012-10-23 NOTE — Telephone Encounter (Signed)
Lab orders have been placed in the computer for the pt along with cxr.  thanks

## 2012-10-23 NOTE — Telephone Encounter (Signed)
Pt aware. Needed nothing further.  

## 2012-10-23 NOTE — Telephone Encounter (Signed)
Last OV 05/01/12. Please advise thanks

## 2012-10-27 ENCOUNTER — Other Ambulatory Visit (INDEPENDENT_AMBULATORY_CARE_PROVIDER_SITE_OTHER): Payer: Medicare Other

## 2012-10-27 ENCOUNTER — Ambulatory Visit (INDEPENDENT_AMBULATORY_CARE_PROVIDER_SITE_OTHER)
Admission: RE | Admit: 2012-10-27 | Discharge: 2012-10-27 | Disposition: A | Discharge status: Home / Self Care | Payer: Medicare Other | Source: Ambulatory Visit | Attending: Pulmonary Disease | Admitting: Pulmonary Disease

## 2012-10-27 DIAGNOSIS — E7800 Pure hypercholesterolemia, unspecified: Secondary | ICD-10-CM

## 2012-10-27 DIAGNOSIS — J984 Other disorders of lung: Secondary | ICD-10-CM | POA: Diagnosis not present

## 2012-10-27 DIAGNOSIS — I4891 Unspecified atrial fibrillation: Secondary | ICD-10-CM

## 2012-10-27 DIAGNOSIS — D649 Anemia, unspecified: Secondary | ICD-10-CM

## 2012-10-27 DIAGNOSIS — R002 Palpitations: Secondary | ICD-10-CM | POA: Diagnosis not present

## 2012-10-27 DIAGNOSIS — E559 Vitamin D deficiency, unspecified: Secondary | ICD-10-CM | POA: Diagnosis not present

## 2012-10-27 LAB — BASIC METABOLIC PANEL
Calcium: 9.3 mg/dL (ref 8.4–10.5)
Creatinine, Ser: 0.8 mg/dL (ref 0.4–1.2)
GFR: 70.81 mL/min (ref >60.00)
Sodium: 140 mEq/L (ref 135–145)

## 2012-10-27 LAB — CBC WITH DIFFERENTIAL/PLATELET
Basophils Relative: 0.4 % (ref 0.0–3.0)
Eosinophils Absolute: 0.1 10*3/uL (ref 0.0–0.7)
Eosinophils Relative: 1.4 % (ref 0.0–5.0)
Lymphocytes Relative: 22.5 % (ref 12.0–46.0)
Monocytes Relative: 6.4 % (ref 3.0–12.0)
Neutrophils Relative %: 69.3 % (ref 43.0–77.0)
RBC: 4.11 Mil/uL (ref 3.87–5.11)
WBC: 10.2 10*3/uL (ref 4.5–10.5)

## 2012-10-27 LAB — LIPID PANEL
Cholesterol: 187 mg/dL (ref 0–200)
HDL: 92.1 mg/dL (ref >39.00)
Triglycerides: 88 mg/dL (ref 0.0–149.0)

## 2012-10-27 LAB — VITAMIN D 25 HYDROXY (VIT D DEFICIENCY, FRACTURES): Vit D, 25-Hydroxy: 56 ng/mL (ref 30–89)

## 2012-10-27 LAB — HEPATIC FUNCTION PANEL
AST: 18 U/L (ref 0–37)
Albumin: 3.8 g/dL (ref 3.5–5.2)
Alkaline Phosphatase: 54 U/L (ref 39–117)
Total Protein: 7.4 g/dL (ref 6.0–8.3)

## 2012-10-30 ENCOUNTER — Encounter: Payer: Self-pay | Admitting: *Deleted

## 2012-10-31 ENCOUNTER — Encounter: Payer: Self-pay | Admitting: Pulmonary Disease

## 2012-10-31 ENCOUNTER — Ambulatory Visit (INDEPENDENT_AMBULATORY_CARE_PROVIDER_SITE_OTHER): Payer: Medicare Other | Admitting: Pulmonary Disease

## 2012-10-31 VITALS — BP 128/70 | HR 64 | Temp 97.4°F | Ht 62.0 in | Wt 116.8 lb

## 2012-10-31 DIAGNOSIS — D126 Benign neoplasm of colon, unspecified: Secondary | ICD-10-CM

## 2012-10-31 DIAGNOSIS — R002 Palpitations: Secondary | ICD-10-CM

## 2012-10-31 DIAGNOSIS — M899 Disorder of bone, unspecified: Secondary | ICD-10-CM

## 2012-10-31 DIAGNOSIS — Z23 Encounter for immunization: Secondary | ICD-10-CM

## 2012-10-31 DIAGNOSIS — M949 Disorder of cartilage, unspecified: Secondary | ICD-10-CM

## 2012-10-31 DIAGNOSIS — E7800 Pure hypercholesterolemia, unspecified: Secondary | ICD-10-CM

## 2012-10-31 DIAGNOSIS — K219 Gastro-esophageal reflux disease without esophagitis: Secondary | ICD-10-CM

## 2012-10-31 DIAGNOSIS — IMO0001 Reserved for inherently not codable concepts without codable children: Secondary | ICD-10-CM

## 2012-10-31 DIAGNOSIS — I059 Rheumatic mitral valve disease, unspecified: Secondary | ICD-10-CM

## 2012-10-31 DIAGNOSIS — I4949 Other premature depolarization: Secondary | ICD-10-CM

## 2012-10-31 DIAGNOSIS — I4891 Unspecified atrial fibrillation: Secondary | ICD-10-CM

## 2012-10-31 DIAGNOSIS — M069 Rheumatoid arthritis, unspecified: Secondary | ICD-10-CM

## 2012-10-31 DIAGNOSIS — H04129 Dry eye syndrome of unspecified lacrimal gland: Secondary | ICD-10-CM

## 2012-10-31 DIAGNOSIS — K5730 Diverticulosis of large intestine without perforation or abscess without bleeding: Secondary | ICD-10-CM

## 2012-10-31 NOTE — Patient Instructions (Addendum)
Today we updated your med list in our EPIC system...    Continue your current medications the same...  We gave you the 2013 Flu vaccine today...  We also reviewed your f/u CXR & fasting blood work...  Call for any problems...  Let's plan a follow up visit in 6 months.Marland KitchenMarland Kitchen

## 2012-10-31 NOTE — Progress Notes (Signed)
Subjective:    Patient ID: Bailey [name], female    DOB: 10-29-1933, 78 y.o.   MRN: [id number]  HPI 78 y/o WF here for a follow up visit... she has multiple medical problems as noted below...    ~  August 20, 2010:  6 mo ROV- doing well overall w/ busy summer & traveling alot... c/o fatigue, saw [name] 8/11 for f/u Sjogren's, OA, on Voltaren gel Prn (offered shot, pt declined)... she saw [facility] 7/11 f/u palpit, hx PVC, MVP, mild AI- on low dose Coreg & stable, no changes made... she had f/u [name] as well & she informs me he did BMD & "it's stable"... recent fasting blood work reviewed w/ pt & copy given... OK Flu vaccine today.  ~  Mar 30, 2011:  64mo ROV & she reports doing satis, much rheumatic complaints but all stable;  She saw [name] 2/12> remains stable, symptoms not severe enough for dis modifying agents;  We reviewed last labs 9/11- all OK;  Going to [place] this summer...  ~  September 29, 2011:  90mo ROV & they had a great trip to [place], visited family, etc;  No new complaints or concerns today other than her c/o fatigue (no change);  She's already had the 2012 flu vaccine, & we reviewed her recent lab work & gave her a copy...    HxMVP, Hx mildAI, Palpit, HxPAFib> on ASA325, Coreg3.125/d; she saw DrNishan in f/u 7/12 & doing well on the BBlocker- no CP, palpit, dizzy, etc; 2DEcho was stable- no change & meds kept the same...     CHOL> on Fish Oil + diet; FLP looks good w/ all parameters at goal...    GI> GERD, Divertics, IBS, Polyps> she had HHrepair yrs ago & uses Prilosec OTC as needed; denies active symptoms at present; colon due 8/13 per DrDBrodie...    ORTHO> ?RA, Raynaud's, Sjogren's, OA, right shoulder impingement> she is followed by [name] & had right shoulder surg by DrCaffrey; take Naproxen 220mg  as needed...    Osteopenia> on calcium, MVI, Vit D; [name] does her BMDs & follows this problem...    Anemia> Labs showed Hg= 11.8, MCV= 93; she has not seen  any bleeding etc; last colon 2006 & f/u due 8/13 per DrDBrodie...    Other> she uses Acyclovir as needed for outbreaks;  She take Prempro per GYN;  She uses Restasis for her dry eyes;    ~  May 01, 2012:  64mo ROV & Bailey [name] is doing well- no new complaints or concerns; she had a wonderful trip to [place] to visit relatives & wedding of a cousin's son; her granddaughter is getting married in [place] here & she is very busy... We reviewed prob list, meds, xrays and labs> see below>>  ~  October 31, 2012:  90mo ROV & Bailey [name] has been stable over the last 90mo, wonderful trip to [place] for wedding & did satis...  We reviewed the following problems for today's visit:     Dry eyes, Sjogren's, OA, & FM> followed by [name] on Restasis, Anaprox; she was seen 8/13- stable, no changes made...    Cardiac- MVP, palpit, HxPAF, mild AI> on ASA325, Coreg3.125/d; followed by Walker Kehr, seen 8/13- stable, no changes made, f/u 17yr; she denies CP, palpit, SOB, edema.    Chol> on FishOil & diet; FLP shows TChol 187, TG 88, HDL 92, LDL 77    GI- GERD, Divertics, IBS, polyps> on Prilosec prn; had colonoscopy 8/13 [name] w/ mod divertics in sigmoid,  otherw neg; Rec to take hi fiber diet.    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    Osteopenia> on VitD supplement & VitD level = 56; BMD at [facility] 7/13 showed TScore -1.7 in left [facility] & [name] offered Fosamax; [name] told her "not to worry" (note from 8/13 reviewed)... We reviewed prob list, meds, xrays and labs> see below for updates >> OK Flu vaccine today... CXR 11/13 showed normal heart size, bilat apical pleuroparenchymal scarring, stable/ NAD... LABS 11/13:  FLP- at goals on diet alone;  Chems- wnl;  CBC- wnl;  TSH= 3.02;  VitD=56          Problem List:   DRY EYE SYNDROME (ICD-375.15) - on RESTASIS per Ophthalmology, & followed for Sjogren's by  [name]...  ALLERGY (ICD-995.3) - stable w/ OTC meds Prn... she saw DrJacobs in 2006 for sinusitis. ~  CXR 9/10 showed clear lungs, NAD, mild degen changes in spine... ~  CXR 11/13 showed normal heart size, bilat apical pleuroparenchymal scarring, stable/ NAD...   PALPITATIONS (ICD-785.1) - Cardiac eval DrNishan- now on COREG 3.125mg  Qam... ~  notes from [place] & DrAllred reviewed> followed for palpit, hx PVCs, hx PAF, mild MVP, mild AI.  Hx of MITRAL VALVE PROLAPSE & MILD AI  Hx of PAROXYSMAL ATRIAL FIBRILLATION - on ASA 325mg /d... she denies CP, notes occas "heavy" feeling, rare palpit, she is very active without difficulty... ~  2DEcho 12/09 did not show MVP- norm LVF, EF= 60%, no regional abn, mild calcif AoV w/ mild AI, mild MR, incr PA sys 37-41 range... ~  EKG 7/12 showed NSR, rate70, ?LAE, otherw wnl... ~  2DEcho 8/12 showed norm LVF w/ EF=60-65% & no wall motion abn; Gr1DD, mild AI, trivial MR, otherw wnl & RVsys=28...  HYPERCHOLESTEROLEMIA, BORDERLINE (ICD-272.0) - on diet + FishOil... but she has a high HDL in the 75-105 range. ~  FLP 6/08 showed TChol 215, TG 78, HDL 99, LDL 88 ~  FLP 10/09 showed TChol 196, TG 100, HDL 82, LDL 94 ~  FLP 9/11 showed TChol 189, TG 73, HDL 84, LDL 90 ~  FLP 10/12 showed TChol 175, TG 64, HDL 86, LDL 76 ~  FLP 11/13 showed TChol 187, TG 88, HDL 92, LDL 77   GERD (ICD-530.81) - she had a HH repair in [place] 40+ yrs ago... occas takes OTC Prilosec Prn now...  DIVERTICULITIS, COLON (ICD-562.11) IRRITABLE BOWEL SYNDROME (ICD-564.1) COLONIC POLYPS (ICD-211.3)  ~  Colonoscopy 8/06 showed divertics only, no recurrent polyps... last polyp removed 8/03= adenomatous... ~  Colonoscopy 8/13 [name] w/ mod divertics in sigmoid, otherw neg; Rec to take hi fiber diet.   RHEUMATOID ARTHRITIS(ICD-714.0) & OSTEOARTHRITIS - Rheumatology eval by [name]> she has Raynaud's, Sjogren's, OA, & right shoulder impingement syndrome...  ~  s/p right shoulder surg  2009 by DrCaffrey... ~  9/10:  she also describes Raynaud's phenomenon & has dry eye ?sicca syndrome?... ~  10/10:  she had lat epicondylitis right elbow... ~  8/12: f/u Rheum OA, SICCA syndrome, Raynaud's, bursitis; she uses Aleve & OA supplements... ~  8/13: she had f/u [name]- stable, no changes made...  ? of FIBROMYALGIA (ICD-729.1) - she states she's had this "all my life" yet she is very active and notes that "exercises help"...  OSTEOPENIA (ICD-733.90) - on Calcium supplements and Vit D OTC... her BMD's are done by [name] & pt reports that last BMD in 2011 was "  stable"...  ~  9/11:  Vit D level = 52 ~  9/12:  Vit D level = 54 ~  7/13:  BMD at [facility] showed TScore -1.7 in left [facility] & [name] offered Fosamax; [name] told her "not to worry" (note from 8/13 reviewed)... ~  11/13:  Vit D level = 56  Health Maintenance -  she sees [name] for GYN- he does her PAP smears, Mammograms, and BMD's... she tells me that her VitD level per GYN was OK and she takes MVI + extra D OTC... she is on [facility] per Gyn... she got the Shingles vaccine at health dept in 2008... 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 ~  IMMUNIZATIONS:  she had PNEUMOVAX 2008 @ age 6... she gets yearly Flu vaccine... OK TDAP 3/11 here...   Past Surgical History  Procedure Date  . Cholecystectomy   . Appendectomy   . Hiatal hernia repair   . Tubal ligation   . Colon tumor 1969    Benign rectal tumor excised  . Cataract extraction   . Refractive surgery   . Rotator cuff repair 2009    right by Dr. Madelon Lips  . Squamous cell removed 2013    Dr. Londell Moh --removed from her right cheek    Outpatient Encounter Prescriptions as of 10/31/2012  Medication Sig Dispense Refill  . acyclovir (ZOVIRAX) 800 MG tablet Take as directed when pt has an outbreak       . aspirin 325 MG tablet Take 325 mg by mouth daily.        . carvedilol (COREG) 3.125 MG tablet Take one tablet by mouth daily as  directed      . Cholecalciferol (VITAMIN D) 400 UNITS capsule Take 2 capsules by mouth daily       . cycloSPORINE (RESTASIS) 0.05 % ophthalmic emulsion 1 drop 2 (two) times daily.        Marland Kitchen estrogen, conjugated,-medroxyprogesterone (PREMPRO) 0.45-1.5 MG per tablet Take 1 tablet by mouth daily.  90 tablet  3  . fish oil-omega-3 fatty acids 1000 MG capsule Take one capsule by mouth two times daily      . naproxen sodium (ANAPROX) 220 MG tablet Take 220 mg by mouth daily.       . [DISCONTINUED] carvedilol (COREG) 3.125 MG tablet Take 1 tablet (3.125 mg total) by mouth 2 (two) times daily with a meal. Take one tablet by mouth daily as directed  180 tablet  3  . [DISCONTINUED] PREMPRO 0.45-1.5 MG per tablet TAKE 1 TABLET BY MOUTH EVERY DAY  84 tablet  3    Allergies  Allergen Reactions  . Diamox (Acetazolamide) Nausea Only  . Epinephrine-Lidocaine-Na Metabisulfite (Lidocaine-Epinephrine) Other (See Comments)    Atrial fibrillation  . Erythromycin     REACTION: severe nausea  . Penicillins     REACTION: unsure of reaction  . Sulfonamide Derivatives     REACTION: nausea-elevated fever--kidney problems    Current Medications, Allergies, Past Medical History, Past Surgical History, Family History, and Social History were reviewed in [employer] record.     Review of Systems         See HPI - all other systems neg except as noted...  The patient denies anorexia, fever, weight loss, weight gain, vision loss, decreased hearing, hoarseness, chest pain, syncope, dyspnea on exertion, peripheral edema, prolonged cough, headaches, hemoptysis, abdominal pain, melena, hematochezia, severe indigestion/heartburn, hematuria, incontinence, muscle weakness, suspicious skin lesions, transient blindness, difficulty walking, depression, unusual weight  change, abnormal bleeding, enlarged lymph nodes, and angioedema.    Objective:   Physical Exam    WD, WN, 78 y/o WF in NAD... GENERAL:   Alert & oriented; pleasant & cooperative... HEENT:  [place]/AT, EOM-wnl, PERRLA, EACs-clear, TMs-wnl, NOSE-clear, THROAT-clear & wnl. NECK:  Supple w/ fairROM; no JVD; normal carotid impulses w/o bruits; no thyromegaly or nodules palpated; no lymphadenopathy. CHEST:  Clear to P & A; without wheezes/ rales/ or rhonchi. HEART:  sl irreg w/ PB's noted; without murmurs/ rubs/ or gallops heard... ABDOMEN:  Soft & nontender; normal bowel sounds; no organomegaly or masses detected. EXT: without deformities, mild arthritic changes; no varicose veins/ venous insuffic/ or edema. NEURO:  CN's intact; motor testing normal; sensory testing normal; gait normal & balance OK. DERM:  No lesions noted; no rash etc...  RADIOLOGY DATA:  Reviewed in the EPIC EMR & discussed w/ the patient...  LABORATORY DATA:  Reviewed in the EPIC EMR & discussed w/ the patient...   Assessment & Plan:    Sjogren's syndrome>  She uses Restasis, keeps lozenges & water handy, etc...  MVP, Mild AI, HxPAF>  Hx palpit controlled w/ Coreg 3.125mg  once daily, no CP/SOB/ etc...  LIPIDS>  On diet + fish oil, she has excellent HDL & all parameters are WNL on diet rx...  GI>  GERD, Divertics, IBS, Polyps> she is up to date & doing satis...  RHEUM>  Followed by Rober Minion, notes reviewed, continue OTC meds Prn...  Other medical issues as noted...   Patient's Medications  New Prescriptions   No medications on file  Previous Medications   ACYCLOVIR (ZOVIRAX) 800 MG TABLET    Take as directed when pt has an outbreak    ASPIRIN 325 MG TABLET    Take 325 mg by mouth daily.     CHOLECALCIFEROL (VITAMIN D) 400 UNITS CAPSULE    Take 2 capsules by mouth daily    CYCLOSPORINE (RESTASIS) 0.05 % OPHTHALMIC EMULSION    1 drop 2 (two) times daily.     ESTROGEN, CONJUGATED,-MEDROXYPROGESTERONE (PREMPRO) 0.45-1.5 MG PER TABLET    Take 1 tablet by mouth daily.   FISH OIL-OMEGA-3 FATTY ACIDS 1000 MG CAPSULE    Take one capsule by mouth two times  daily   NAPROXEN SODIUM (ANAPROX) 220 MG TABLET    Take 220 mg by mouth daily.   Modified Medications   Modified Medication Previous Medication   CARVEDILOL (COREG) 3.125 MG TABLET carvedilol (COREG) 3.125 MG tablet      Take one tablet by mouth daily as directed    Take 1 tablet (3.125 mg total) by mouth 2 (two) times daily with a meal. Take one tablet by mouth daily as directed  Discontinued Medications   PREMPRO 0.45-1.5 MG PER TABLET    TAKE 1 TABLET BY MOUTH EVERY DAY

## 2012-11-03 DIAGNOSIS — L819 Disorder of pigmentation, unspecified: Secondary | ICD-10-CM | POA: Diagnosis not present

## 2012-11-03 DIAGNOSIS — D239 Other benign neoplasm of skin, unspecified: Secondary | ICD-10-CM | POA: Diagnosis not present

## 2012-11-03 DIAGNOSIS — Z85828 Personal history of other malignant neoplasm of skin: Secondary | ICD-10-CM | POA: Diagnosis not present

## 2012-11-03 DIAGNOSIS — D1801 Hemangioma of skin and subcutaneous tissue: Secondary | ICD-10-CM | POA: Diagnosis not present

## 2012-11-03 DIAGNOSIS — L821 Other seborrheic keratosis: Secondary | ICD-10-CM | POA: Diagnosis not present

## 2012-12-06 DIAGNOSIS — M3500 Sicca syndrome, unspecified: Secondary | ICD-10-CM | POA: Diagnosis not present

## 2012-12-06 DIAGNOSIS — I7300 Raynaud's syndrome without gangrene: Secondary | ICD-10-CM | POA: Diagnosis not present

## 2012-12-06 DIAGNOSIS — M19049 Primary osteoarthritis, unspecified hand: Secondary | ICD-10-CM | POA: Diagnosis not present

## 2012-12-06 DIAGNOSIS — M19079 Primary osteoarthritis, unspecified ankle and foot: Secondary | ICD-10-CM | POA: Diagnosis not present

## 2013-01-08 DIAGNOSIS — H04129 Dry eye syndrome of unspecified lacrimal gland: Secondary | ICD-10-CM | POA: Diagnosis not present

## 2013-01-08 DIAGNOSIS — Z961 Presence of intraocular lens: Secondary | ICD-10-CM | POA: Diagnosis not present

## 2013-05-01 ENCOUNTER — Ambulatory Visit (INDEPENDENT_AMBULATORY_CARE_PROVIDER_SITE_OTHER): Payer: Medicare Other | Admitting: Pulmonary Disease

## 2013-05-01 ENCOUNTER — Encounter: Payer: Self-pay | Admitting: Pulmonary Disease

## 2013-05-01 VITALS — BP 138/78 | HR 72 | Temp 99.3°F | Ht 62.0 in | Wt 116.4 lb

## 2013-05-01 DIAGNOSIS — E7800 Pure hypercholesterolemia, unspecified: Secondary | ICD-10-CM

## 2013-05-01 DIAGNOSIS — R002 Palpitations: Secondary | ICD-10-CM | POA: Diagnosis not present

## 2013-05-01 DIAGNOSIS — I059 Rheumatic mitral valve disease, unspecified: Secondary | ICD-10-CM

## 2013-05-01 DIAGNOSIS — M3500 Sicca syndrome, unspecified: Secondary | ICD-10-CM

## 2013-05-01 DIAGNOSIS — K5730 Diverticulosis of large intestine without perforation or abscess without bleeding: Secondary | ICD-10-CM

## 2013-05-01 DIAGNOSIS — T7840XA Allergy, unspecified, initial encounter: Secondary | ICD-10-CM

## 2013-05-01 DIAGNOSIS — H04129 Dry eye syndrome of unspecified lacrimal gland: Secondary | ICD-10-CM | POA: Diagnosis not present

## 2013-05-01 DIAGNOSIS — M899 Disorder of bone, unspecified: Secondary | ICD-10-CM

## 2013-05-01 DIAGNOSIS — K219 Gastro-esophageal reflux disease without esophagitis: Secondary | ICD-10-CM

## 2013-05-01 DIAGNOSIS — M949 Disorder of cartilage, unspecified: Secondary | ICD-10-CM

## 2013-05-01 NOTE — Patient Instructions (Addendum)
Today we updated your med list in our EPIC system...    Continue your current medications the same...  Call for any questions...  Let's plan a follow up visit in 6mo, sooner if needed for problems...   

## 2013-05-01 NOTE — Progress Notes (Signed)
Subjective:    Patient ID: Bailey [name], female    DOB: May 09, 1933, 79 y.o.   MRN: [id number]  HPI 79 y/o WF here for a follow up visit... she has multiple medical problems as noted below...    ~  September 29, 2011:  44mo ROV & they had a great trip to [place], visited family, etc;  No new complaints or concerns today other than her c/o fatigue (no change);  She's already had the 2012 flu vaccine, & we reviewed her recent lab work & gave her a copy...    HxMVP, Hx mildAI, Palpit, HxPAFib> on ASA325, Coreg3.125/d; she saw DrNishan in f/u 7/12 & doing well on the BBlocker- no CP, palpit, dizzy, etc; 2DEcho was stable- no change & meds kept the same...     CHOL> on Fish Oil + diet; FLP looks good w/ all parameters at goal...    GI> GERD, Divertics, IBS, Polyps> she had HHrepair yrs ago & uses Prilosec OTC as needed; denies active symptoms at present; colon due 8/13 per DrDBrodie...    ORTHO> ?RA, Raynaud's, Sjogren's, OA, right shoulder impingement> she is followed by [name] & had right shoulder surg by DrCaffrey; take Naproxen 220mg  as needed...    Osteopenia> on calcium, MVI, Vit D; [name] does her BMDs & follows this problem...    Anemia> Labs showed Hg= 11.8, MCV= 93; she has not seen any bleeding etc; last colon 2006 & f/u due 8/13 per DrDBrodie...    Other> she uses Acyclovir as needed for outbreaks;  She take Prempro per GYN;  She uses Restasis for her dry eyes;    ~  May 01, 2012:  88mo ROV & [name] is doing well- no new complaints or concerns; she had a wonderful trip to [place] to visit relatives & wedding of a cousin's son; her granddaughter is getting married in [place] here & she is very busy... We reviewed prob list, meds, xrays and labs> see below>>  ~  October 31, 2012:  44mo ROV & [name] has been stable over the last 44mo, wonderful trip to [place] for wedding & did satis...  We reviewed the following problems for today's visit:     Dry eyes, Sjogren's, OA, & FM> followed by [name]  on Restasis, Anaprox; she was seen 8/13- stable, no changes made...    Cardiac- MVP, palpit, HxPAF, mild AI> on ASA325, Coreg3.125/d; followed by Walker Kehr, seen 8/13- stable, no changes made, f/u 53yr; she denies CP, palpit, SOB, edema.    Chol> on FishOil & diet; FLP shows TChol 187, TG 88, HDL 92, LDL 77    GI- GERD, Divertics, IBS, polyps> on Prilosec prn; had colonoscopy 8/13 [name] w/ mod divertics in sigmoid, otherw neg; Rec to take hi fiber diet.    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    Osteopenia> on VitD supplement & VitD level = 56; BMD at [facility] 7/13 showed TScore -1.7 in left [facility] & [name] offered Fosamax; [name] told her "not to worry" (note from 8/13 reviewed)... We reviewed prob list, meds, xrays and labs> see below for updates >> OK Flu vaccine today... CXR 11/13 showed normal heart size, bilat apical pleuroparenchymal scarring, stable/ NAD... LABS 11/13:  FLP- at goals on diet alone;  Chems- wnl;  CBC- wnl;  TSH= 3.02;  VitD=56  ~  May 01, 2013:  44mo ROV & [name] has been doing well- notes one epis  dizziness that lasted seconds & not assoc w/ CP, palpit, position change, etc... She continues to f/u w/ [name] for rheum- Sjogren's, Raynauds, but doing reasonably well w/o new problems... She is enjoying travel, family, yard, etc... We reviewed the following medical problems during today's office visit >>     Dry eyes, Sjogren's, OA, & FM> followed by [name] on Restasis, Anaprox; she was seen 1/14- stable, no changes made...    Cardiac- MVP, palpit, HxPAF, mild AI> on ASA325, Coreg3.125/d; followed by Walker Kehr, seen 8/13- stable, no changes made, f/u 21yr; she denies CP, palpit, SOB, edema.    Chol> on FishOil & diet; FLP 11/13 showed TChol 187, TG 88, HDL 92, LDL 77    GI- GERD, Divertics, IBS, polyps> on Prilosec prn; had colonoscopy 8/13 [name] w/ mod divertics in  sigmoid, otherw neg; Rec to take hi fiber diet.    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    Osteopenia> on VitD supplement & VitD level = 56; BMD at [facility] 7/13 showed TScore -1.7 in left [facility] & [name] offered Fosamax; [name] told her "not to worry" (note from 8/13 reviewed) 7 she continues on the Prempro, calcium, MVI, VitD... We reviewed prob list, meds, xrays and labs> see below for updates >>            Problem List:   DRY EYE SYNDROME (ICD-375.15) - on RESTASIS per Ophthalmology, & followed for Sjogren's by [name]...  ALLERGY (ICD-995.3) - stable w/ OTC meds Prn... she saw DrJacobs in 2006 for sinusitis. ~  CXR 9/10 showed clear lungs, NAD, mild degen changes in spine... ~  CXR 11/13 showed normal heart size, bilat apical pleuroparenchymal scarring, stable/ NAD...   PALPITATIONS (ICD-785.1) - Cardiac eval DrNishan- now on COREG 3.125mg  Qam... ~  notes from [place] & DrAllred reviewed> followed for palpit, hx PVCs, hx PAF, mild MVP, mild AI.  Hx of MITRAL VALVE PROLAPSE & MILD AI  Hx of PAROXYSMAL ATRIAL FIBRILLATION - on ASA 325mg /d... she denies CP, notes occas "heavy" feeling, rare palpit, she is very active without difficulty... ~  2DEcho 12/09 did not show MVP- norm LVF, EF= 60%, no regional abn, mild calcif AoV w/ mild AI, mild MR, incr PA sys 37-41 range... ~  EKG 7/12 showed NSR, rate70, ?LAE, otherw wnl... ~  2DEcho 8/12 showed norm LVF w/ EF=60-65% & no wall motion abn; Gr1DD, mild AI, trivial MR, otherw wnl & RVsys=28...  HYPERCHOLESTEROLEMIA, BORDERLINE (ICD-272.0) - on diet + FishOil... but she has a high HDL in the 75-105 range. ~  FLP 6/08 showed TChol 215, TG 78, HDL 99, LDL 88 ~  FLP 10/09 showed TChol 196, TG 100, HDL 82, LDL 94 ~  FLP 9/11 showed TChol 189, TG 73, HDL 84, LDL 90 ~  FLP 10/12 showed TChol 175, TG 64, HDL 86, LDL 76 ~  FLP 11/13 showed  TChol 187, TG 88, HDL 92, LDL 77   GERD (ICD-530.81) - she had a HH repair in [place] 40+ yrs ago... occas takes OTC Prilosec Prn now...  DIVERTICULITIS, COLON (ICD-562.11) IRRITABLE BOWEL SYNDROME (ICD-564.1) COLONIC POLYPS (ICD-211.3)  ~  Colonoscopy 8/06 showed divertics only, no recurrent polyps... last polyp removed 8/03= adenomatous... ~  Colonoscopy 8/13 [name] w/ mod divertics in sigmoid, otherw neg; Rec to take hi fiber diet.   OSTEOARTHRITIS - Rheumatology eval by [name]> she has Raynaud's, Sjogren's, OA, & right shoulder impingement syndrome...  ~  s/p right shoulder surg 2009 by DrCaffrey... ~  9/10:  she also describes Raynaud's phenomenon & has dry eye ?sicca syndrome?... ~  10/10:  she had lat epicondylitis right elbow... ~  8/12: f/u Rheum OA, SICCA syndrome, Raynaud's, bursitis; she uses Aleve & OA supplements... ~  8/13 & 1/14: she had f/u [name]- stable, no changes made...  ? of FIBROMYALGIA (ICD-729.1) - she states she's had this "all my life" yet she is very active and notes that "exercises help"...  OSTEOPENIA (ICD-733.90) - on Calcium supplements and Vit D OTC... her BMD's are done by [name] & pt reports that last BMD in 2011 was "stable"...  ~  9/11:  Vit D level = 52 ~  9/12:  Vit D level = 54 ~  7/13:  BMD at [facility] showed TScore -1.7 in left [facility] & [name] offered Fosamax; [name] told her "not to worry" (note from 8/13 reviewed)... ~  11/13:  Vit D level = 56  Health Maintenance -  she sees [name] for GYN- he does her PAP smears, Mammograms, and BMD's... she tells me that her VitD level per GYN was OK and she takes MVI + extra D OTC... she is on [facility] per Gyn... she got the Shingles vaccine at health dept in 2008... 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 ~  IMMUNIZATIONS:  she had PNEUMOVAX 2008 @ age 52... she gets yearly Flu vaccine... OK TDAP 3/11 here...   Past Surgical History  Procedure Laterality Date  .  Cholecystectomy    . Appendectomy    . Hiatal hernia repair    . Tubal ligation    . Colon tumor  1969    Benign rectal tumor excised  . Cataract extraction    . Refractive surgery    . Rotator cuff repair  2009    right by Dr. Madelon Lips  . Squamous cell removed  2013    Dr. Londell Moh --removed from her right cheek    Outpatient Encounter Prescriptions as of 05/01/2013  Medication Sig Dispense Refill  . acyclovir (ZOVIRAX) 800 MG tablet Take as directed when pt has an outbreak       . aspirin 325 MG tablet Take 325 mg by mouth daily.        . carvedilol (COREG) 3.125 MG tablet Take one tablet by mouth daily as directed      . Cholecalciferol (VITAMIN D) 400 UNITS capsule Take 2 capsules by mouth daily       . cycloSPORINE (RESTASIS) 0.05 % ophthalmic emulsion 1 drop 2 (two) times daily.        Marland Kitchen estrogen, conjugated,-medroxyprogesterone (PREMPRO) 0.45-1.5 MG per tablet Take 1 tablet by mouth daily.  90 tablet  3  . fish oil-omega-3 fatty acids 1000 MG capsule Take one capsule by mouth two times daily      . naproxen sodium (ANAPROX) 220 MG tablet Take 220 mg by mouth daily.        No facility-administered encounter medications on file as of 05/01/2013.    Allergies  Allergen Reactions  . Diamox (Acetazolamide) Nausea Only  . Epinephrine-Lidocaine-Na Metabisulfite (Lidocaine-Epinephrine) Other (See Comments)    Atrial fibrillation  . Erythromycin     REACTION: severe nausea  . Penicillins     REACTION: unsure of reaction  . Sulfonamide Derivatives     REACTION: nausea-elevated fever--kidney problems    Current Medications, Allergies, Past Medical History, Past Surgical History, Family History, and Social History were reviewed in [place]  Link electronic medical record.     Review of Systems         See HPI - all other systems neg except as noted...  The patient denies anorexia, fever, weight loss, weight gain, vision loss, decreased hearing, hoarseness, chest pain, syncope,  dyspnea on exertion, peripheral edema, prolonged cough, headaches, hemoptysis, abdominal pain, melena, hematochezia, severe indigestion/heartburn, hematuria, incontinence, muscle weakness, suspicious skin lesions, transient blindness, difficulty walking, depression, unusual weight change, abnormal bleeding, enlarged lymph nodes, and angioedema.    Objective:   Physical Exam    WD, WN, 79 y/o WF in NAD... GENERAL:  Alert & oriented; pleasant & cooperative... HEENT:  [place]/AT, EOM-wnl, PERRLA, EACs-clear, TMs-wnl, NOSE-clear, THROAT-clear & wnl. NECK:  Supple w/ fairROM; no JVD; normal carotid impulses w/o bruits; no thyromegaly or nodules palpated; no lymphadenopathy. CHEST:  Clear to P & A; without wheezes/ rales/ or rhonchi. HEART:  sl irreg w/ PB's noted; without murmurs/ rubs/ or gallops heard... ABDOMEN:  Soft & nontender; normal bowel sounds; no organomegaly or masses detected. EXT: without deformities, mild arthritic changes; no varicose veins/ venous insuffic/ or edema. NEURO:  CN's intact; motor testing normal; sensory testing normal; gait normal & balance OK. DERM:  No lesions noted; no rash etc...  RADIOLOGY DATA:  Reviewed in the EPIC EMR & discussed w/ the patient...  LABORATORY DATA:  Reviewed in the EPIC EMR & discussed w/ the patient...   Assessment & Plan:    Sjogren's syndrome>  She uses Restasis, keeps lozenges & water handy, etc...  MVP, Mild AI, HxPAF>  Hx palpit controlled w/ Coreg 3.125mg  once daily, no CP/SOB/ etc...  LIPIDS>  On diet + fish oil, she has excellent HDL & all parameters are WNL on diet rx...  GI>  GERD, Divertics, IBS, Polyps> she is up to date & doing satis...  GYN- Osteopenia> on Prempro from [name] along w/ calcium, MVI, VitD...  RHEUM>  Followed by Rober Minion, notes reviewed, continue OTC meds Prn...  Other medical issues as noted...   Patient's Medications  New Prescriptions   No medications on file  Previous Medications    ACYCLOVIR (ZOVIRAX) 800 MG TABLET    Take as directed when pt has an outbreak    ASPIRIN 325 MG TABLET    Take 325 mg by mouth daily.     CARVEDILOL (COREG) 3.125 MG TABLET    Take one tablet by mouth daily as directed   CHOLECALCIFEROL (VITAMIN D) 400 UNITS CAPSULE    Take 2 capsules by mouth daily    CYCLOSPORINE (RESTASIS) 0.05 % OPHTHALMIC EMULSION    1 drop 2 (two) times daily.     ESTROGEN, CONJUGATED,-MEDROXYPROGESTERONE (PREMPRO) 0.45-1.5 MG PER TABLET    Take 1 tablet by mouth daily.   FISH OIL-OMEGA-3 FATTY ACIDS 1000 MG CAPSULE    Take one capsule by mouth two times daily   NAPROXEN SODIUM (ANAPROX) 220 MG TABLET    Take 220 mg by mouth daily.   Modified Medications   No medications on file  Discontinued Medications   No medications on file

## 2013-05-02 DIAGNOSIS — D1801 Hemangioma of skin and subcutaneous tissue: Secondary | ICD-10-CM | POA: Diagnosis not present

## 2013-05-02 DIAGNOSIS — L819 Disorder of pigmentation, unspecified: Secondary | ICD-10-CM | POA: Diagnosis not present

## 2013-05-02 DIAGNOSIS — L821 Other seborrheic keratosis: Secondary | ICD-10-CM | POA: Diagnosis not present

## 2013-05-02 DIAGNOSIS — L578 Other skin changes due to chronic exposure to nonionizing radiation: Secondary | ICD-10-CM | POA: Diagnosis not present

## 2013-05-02 DIAGNOSIS — Z85828 Personal history of other malignant neoplasm of skin: Secondary | ICD-10-CM | POA: Diagnosis not present

## 2013-05-02 DIAGNOSIS — D239 Other benign neoplasm of skin, unspecified: Secondary | ICD-10-CM | POA: Diagnosis not present

## 2013-05-30 ENCOUNTER — Encounter: Payer: Self-pay | Admitting: Pulmonary Disease

## 2013-06-05 DIAGNOSIS — I7300 Raynaud's syndrome without gangrene: Secondary | ICD-10-CM | POA: Diagnosis not present

## 2013-06-05 DIAGNOSIS — M3500 Sicca syndrome, unspecified: Secondary | ICD-10-CM | POA: Diagnosis not present

## 2013-06-05 DIAGNOSIS — M19049 Primary osteoarthritis, unspecified hand: Secondary | ICD-10-CM | POA: Diagnosis not present

## 2013-06-05 DIAGNOSIS — M19079 Primary osteoarthritis, unspecified ankle and foot: Secondary | ICD-10-CM | POA: Diagnosis not present

## 2013-06-11 DIAGNOSIS — Z1231 Encounter for screening mammogram for malignant neoplasm of breast: Secondary | ICD-10-CM | POA: Diagnosis not present

## 2013-06-12 ENCOUNTER — Encounter: Payer: Self-pay | Admitting: Gynecology

## 2013-07-09 ENCOUNTER — Ambulatory Visit (INDEPENDENT_AMBULATORY_CARE_PROVIDER_SITE_OTHER): Payer: Medicare Other | Admitting: Gynecology

## 2013-07-09 ENCOUNTER — Encounter: Payer: Self-pay | Admitting: Gynecology

## 2013-07-09 VITALS — BP 116/74 | Ht 62.0 in | Wt 116.0 lb

## 2013-07-09 DIAGNOSIS — N952 Postmenopausal atrophic vaginitis: Secondary | ICD-10-CM | POA: Diagnosis not present

## 2013-07-09 DIAGNOSIS — M949 Disorder of cartilage, unspecified: Secondary | ICD-10-CM | POA: Diagnosis not present

## 2013-07-09 DIAGNOSIS — M8580 Other specified disorders of bone density and structure, unspecified site: Secondary | ICD-10-CM

## 2013-07-09 DIAGNOSIS — M899 Disorder of bone, unspecified: Secondary | ICD-10-CM

## 2013-07-09 DIAGNOSIS — Z79890 Hormone replacement therapy (postmenopausal): Secondary | ICD-10-CM

## 2013-07-09 MED ORDER — CONJ ESTROG-MEDROXYPROGEST ACE 0.45-1.5 MG PO TABS: 1.0000 | ORAL_TABLET | Freq: Every day | ORAL | Status: DC

## 2013-07-09 NOTE — Progress Notes (Signed)
PADEN KURAS [birth year] [id number]        79 y.o.  [id number] for followup exam.  Former patient of Dr. Eda Paschal. Several issues noted below.  Past medical history,surgical history, medications, allergies, family history and social history were all reviewed and documented in the EPIC chart.  ROS:  Performed and pertinent positives and negatives are included in the history, assessment and plan .  Exam: Kim assistant Filed Vitals:   07/09/13 0922  BP: 116/74  Height: 5\' 2"  (1.575 m)  Weight: 116 lb (52.617 kg)   General appearance  Normal Skin grossly normal Head/Neck normal with no cervical or supraclavicular adenopathy thyroid normal Lungs  clear Cardiac RR, without RMG Abdominal  soft, nontender, without masses, organomegaly or hernia Breasts  examined lying and sitting without masses, retractions, discharge or axillary adenopathy. Pelvic  Ext/BUS/vagina  normal with atrophic changes  Cervix  normal with atrophic changes  Uterus  anteverted, normal size, shape and contour, midline and mobile nontender   Adnexa  Without masses or tenderness    Anus and perineum  normal   Rectovaginal  normal sphincter tone without palpated masses or tenderness.    Assessment/Plan:  79 y.o. [id number] female for followup exam.   1. HRT. Patient on Prempro 0.45/1.5. No bleeding. Historically started due to deteriorating bone health but noted that she felt dramatically better as far as overall well-being. Not having significant hot flashes or sweats. No significant vaginal dryness/dyspareunia.  I reviewed the whole issue of HRT with her to include the WHI study with increased risk of stroke, heart attack, DVT and breast cancer. The ACOG and NAMS statements for lowest dose for the shortest period of time reviewed. Transdermal versus oral first-pass effect benefit discussed. Use of HRT in the elderly particularly with other medical issues reviewed. The patient very strongly wants to continue on her HRT clearly  understanding the risks and unusual nature of continuing into her 53s and I refilled her x1 year. I did ask her to wean this coming winter and fall to see how she does and if she does all right to stay off of HRT. If she does not then to continue on this. 2. Osteopenia. DEXA 05/2012 with T score -1.7. FRAX 11%/2.9%. Had discussed possible Fosamax with Dr. Eda Paschal but they decided against this. Increase calcium vitamin D reviewed. Recommend repeat DEXA next year at two-year interval. 3. Mammography 05/2013. Continue with annual mammography. SBE monthly reviewed. 4. Pap smear 2012. No Pap smear done today. No history of significant abnormal Pap smears. Discussed current screening guidelines and options to stop screening altogether versus continuing less frequent screening intervals reviewed and we'll readdress on an annual basis. 5. Colonoscopy 2013. Repeat at their recommended interval. 6. Health maintenance. No blood work done as it is all done through her primary physician's office. Followup one year, sooner as needed.   Note: This document was prepared with digital dictation and possible smart phrase technology. Any transcriptional errors that result from this process are unintentional.   Dara Lords MD, 10:07 AM 07/09/2013

## 2013-07-09 NOTE — Patient Instructions (Signed)
Try weaning from hormones this coming winter. Followup with me in one year, sooner if any issues.

## 2013-07-10 LAB — URINALYSIS W MICROSCOPIC + REFLEX CULTURE
Hgb urine dipstick: NEGATIVE
Leukocytes, UA: NEGATIVE
Nitrite: NEGATIVE
Protein, ur: NEGATIVE mg/dL
Urobilinogen, UA: 0.2 mg/dL (ref 0.0–1.0)

## 2013-07-23 ENCOUNTER — Other Ambulatory Visit: Payer: Self-pay | Admitting: Cardiovascular Disease

## 2013-08-17 DIAGNOSIS — I8310 Varicose veins of unspecified lower extremity with inflammation: Secondary | ICD-10-CM | POA: Diagnosis not present

## 2013-08-17 DIAGNOSIS — Z85828 Personal history of other malignant neoplasm of skin: Secondary | ICD-10-CM | POA: Diagnosis not present

## 2013-09-03 ENCOUNTER — Encounter: Payer: Self-pay | Admitting: Cardiovascular Disease

## 2013-09-03 ENCOUNTER — Ambulatory Visit (INDEPENDENT_AMBULATORY_CARE_PROVIDER_SITE_OTHER): Payer: Medicare Other | Admitting: Cardiovascular Disease

## 2013-09-03 VITALS — BP 130/85 | HR 60 | Resp 12 | Ht 63.0 in | Wt 117.0 lb

## 2013-09-03 DIAGNOSIS — I059 Rheumatic mitral valve disease, unspecified: Secondary | ICD-10-CM | POA: Diagnosis not present

## 2013-09-03 DIAGNOSIS — E7800 Pure hypercholesterolemia, unspecified: Secondary | ICD-10-CM | POA: Diagnosis not present

## 2013-09-03 DIAGNOSIS — I4891 Unspecified atrial fibrillation: Secondary | ICD-10-CM | POA: Diagnosis not present

## 2013-09-03 NOTE — Assessment & Plan Note (Signed)
No murmur on exam  Last echo 2012 with mild MR  No indication for echo at this time

## 2013-09-03 NOTE — Progress Notes (Signed)
Patient ID: Bailey [name], female   DOB: Apr 17, 1933, 79 y.o.   MRN: [id number] Bailey [name] is seen today in followup for palpitations and PVCs. She saw Dr. Johney Frame in February 2011 and he agreed to continue beta blocker therapy. she's been doing better. She was contemplating not taking her p.m. dose according. She had a trip to the beach which was exciting. Her palpitations have been well under control and she has not had any significant chest pain or syncope. SHe is happy to continue with her beta blocker. Her tests at the end of 2009 showed no evidence of structural heart disease. She continues to see Dr. Nile Riggs for her eye care. She does have a history of mitral valve prolapse and mild aortic insufficiency. She had some dyspnea at the beach was was unsettling along with some LE edema. No excess salt. Edema has happened in past due to standing and ankle varicosities  Last echo 2012  only trivial MR and mild AR  Still traveling to [place] frequently  Had generalized malaise after trip to [place] last week       ROS: Denies fever, malais, weight loss, blurry vision, decreased visual acuity, cough, sputum, SOB, hemoptysis, pleuritic pain, palpitaitons, heartburn, abdominal pain, melena, lower extremity edema, claudication, or rash.  All other systems reviewed and negative  General: Affect appropriate Healthy:  appears stated age HEENT: normal Neck supple with no adenopathy JVP normal no bruits no thyromegaly Lungs clear with no wheezing and good diaphragmatic motion Heart:  S1/S2 no murmur, no rub, gallop or click PMI normal Abdomen: benighn, BS positve, no tenderness, no AAA no bruit.  No HSM or HJR Distal pulses intact with no bruits No edema Neuro non-focal Skin warm and dry No muscular weakness   Current Outpatient Prescriptions  Medication Sig Dispense Refill  . acyclovir (ZOVIRAX) 800 MG tablet Take as directed when pt has an outbreak       . aspirin 325 MG tablet Take 325 mg by mouth  daily.        . carvedilol (COREG) 3.125 MG tablet Take one tablet by mouth daily as directed      . Cholecalciferol (VITAMIN D) 400 UNITS capsule Take 2 capsules by mouth daily       . cycloSPORINE (RESTASIS) 0.05 % ophthalmic emulsion 1 drop 2 (two) times daily.        Marland Kitchen estrogen, conjugated,-medroxyprogesterone (PREMPRO) 0.45-1.5 MG per tablet Take 1 tablet by mouth daily.  90 tablet  3  . fish oil-omega-3 fatty acids 1000 MG capsule Take one capsule by mouth two times daily      . fluocinonide cream (LIDEX) 0.05 % USE AS DIRECTED      . naproxen sodium (ANAPROX) 220 MG tablet Take 220 mg by mouth daily.        No current facility-administered medications for this visit.    Allergies  Diamox; Epinephrine-lidocaine-na metabisulfite; Erythromycin; Penicillins; and Sulfonamide derivatives    Electrocardiogram:  06/29/11  SR rate 71 normal ECG LAE   Today SR with ventricular bigemminy intermitant  Underlying ECG is normal with LAD  Assessment and Plan

## 2013-09-03 NOTE — Assessment & Plan Note (Signed)
Cholesterol is at goal.  Continue current dose of statin and diet Rx.  No myalgias or side effects.  F/U  LFT's in 6 months. Lab Results  Component Value Date   LDLCALC 77 10/27/2012

## 2013-09-03 NOTE — Patient Instructions (Signed)
Your physician wants you to follow-up in: 1 year  You will receive a reminder letter in the mail two months in advance. If you don't receive a letter, please call our office to schedule the follow-up appointment.  Your physician recommends that you continue on your current medications as directed. Please refer to the Current Medication list given to you today.  

## 2013-09-03 NOTE — Assessment & Plan Note (Signed)
Maint NSR  No need for anticoagulation Continue low dose beta blocker

## 2013-09-04 ENCOUNTER — Ambulatory Visit (INDEPENDENT_AMBULATORY_CARE_PROVIDER_SITE_OTHER): Payer: Medicare Other | Admitting: Adult Health

## 2013-09-04 ENCOUNTER — Encounter: Payer: Self-pay | Admitting: Adult Health

## 2013-09-04 VITALS — BP 118/72 | HR 52 | Temp 98.7°F | Ht 62.0 in | Wt 119.2 lb

## 2013-09-04 DIAGNOSIS — J019 Acute sinusitis, unspecified: Secondary | ICD-10-CM | POA: Diagnosis not present

## 2013-09-04 MED ORDER — AZITHROMYCIN 250 MG PO TABS: ORAL_TABLET | ORAL | Status: AC

## 2013-09-04 NOTE — Assessment & Plan Note (Signed)
Acute sinusitis and bronchitis -slow to resolve   Plan  Zpack take as directed  Mucinex DM Twice daily  As needed  Cough/congestion  Saline nasal rinses As needed   Fluids and rest  Please contact office for sooner follow up if symptoms do not improve or worsen or seek emergency care  follow up Dr. Kriste Basque  As planned and As needed

## 2013-09-04 NOTE — Progress Notes (Signed)
Subjective:    Patient ID: Bailey [name], female    DOB: 1933-06-02, 79 y.o.   MRN: [id number]  HPI 79 y/o WF   09/04/2013 Acute OV  Complains of sinus pressure/congestion, PND, prod cough with thick yellow/green mucus, occ wheezing x2 weeks - denies f/c/s, hemoptysis, edema, nausea/vomiting.  recent cruise to [place] returned 4 days ago.  Did not hear of any major viral illnesses on her cruise ship. Husband. Did not have any similar symptoms. Have taking Claritin without much help. She denies any hemoptysis, orthopnea, PND, leg swelling, calf pain, visual changes, or rash. 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  ALLERGY (ICD-995.3) - stable w/ OTC meds Prn... she saw DrJacobs in 2006 for sinusitis. ~  CXR 9/10 showed clear lungs, NAD, mild degen changes in spine... ~  CXR 11/13 showed normal heart size, bilat apical pleuroparenchymal scarring, stable/ NAD...   PALPITATIONS (ICD-785.1) - Cardiac eval DrNishan- now on COREG 3.125mg  Qam... ~  notes from [place] & DrAllred reviewed> followed for palpit, hx PVCs, hx PAF, mild MVP, mild AI.  Hx of MITRAL VALVE PROLAPSE & MILD AI  Hx of PAROXYSMAL ATRIAL FIBRILLATION - on ASA 325mg /d... she denies CP, notes occas "heavy" feeling, rare palpit, she is very active without difficulty... ~  2DEcho 12/09 did not show MVP- norm LVF, EF= 60%, no regional abn, mild calcif AoV w/ mild AI, mild MR, incr PA sys 37-41 range... ~  EKG 7/12 showed NSR, rate70, ?LAE, otherw wnl... ~  2DEcho 8/12 showed norm LVF w/ EF=60-65% & no wall motion abn; Gr1DD, mild AI, trivial MR, otherw wnl & RVsys=28...  HYPERCHOLESTEROLEMIA, BORDERLINE (ICD-272.0) - on diet + FishOil... but she has a high HDL in the 75-105 range. ~  FLP 6/08 showed TChol 215, TG 78, HDL 99, LDL 88 ~  FLP 10/09 showed TChol 196, TG 100, HDL 82, LDL 94 ~  FLP 9/11 showed  TChol 189, TG 73, HDL 84, LDL 90 ~  FLP 10/12 showed TChol 175, TG 64, HDL 86, LDL 76 ~  FLP 11/13 showed TChol 187, TG 88, HDL 92, LDL 77   GERD (ICD-530.81) - she had a HH repair in [place] 40+ yrs ago... occas takes OTC Prilosec Prn now...  DIVERTICULITIS, COLON (ICD-562.11) IRRITABLE BOWEL SYNDROME (ICD-564.1) COLONIC POLYPS (ICD-211.3)  ~  Colonoscopy 8/06 showed divertics only, no recurrent polyps... last polyp removed 8/03= adenomatous... ~  Colonoscopy 8/13 [name] w/ mod divertics in sigmoid, otherw neg; Rec to take hi fiber diet.   OSTEOARTHRITIS - Rheumatology eval by [name]> she has Raynaud's, Sjogren's, OA, & right shoulder impingement syndrome...  ~  s/p right shoulder surg 2009 by DrCaffrey... ~  9/10:  she also describes Raynaud's phenomenon & has dry eye ?sicca syndrome?... ~  10/10:  she had lat epicondylitis right elbow... ~  8/12: f/u Rheum OA, SICCA syndrome, Raynaud's, bursitis; she uses Aleve & OA supplements... ~  8/13 & 1/14: she had f/u [name]- stable, no changes made...  ? of FIBROMYALGIA (ICD-729.1) - she states she's had this "all my life" yet she is very active and notes that "exercises help"...  OSTEOPENIA (ICD-733.90) - on Calcium supplements and Vit D OTC... her BMD's are done by [name] & pt reports that last BMD in 2011 was "stable"...  ~  9/11:  Vit D level = 52 ~  9/12:  Vit D level = 54 ~  7/13:  BMD at [facility] showed TScore -1.7 in left [facility] & [name] offered Fosamax; [name] told her "not to worry" (note from 8/13 reviewed)... ~  11/13:  Vit D level = 56  Health Maintenance -  she sees [name] for GYN- he does her PAP smears, Mammograms, and BMD's... she tells me that her VitD level per GYN was OK and she takes MVI + extra D OTC... she is on [facility] per Gyn... she got the Shingles vaccine at health dept in 2008... 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 ~  IMMUNIZATIONS:  she had PNEUMOVAX 2008 @ age 25... she  gets yearly Flu vaccine... OK TDAP 3/11 here...   Past Surgical History  Procedure Laterality Date  . Cholecystectomy    . Appendectomy    . Hiatal hernia repair    . Tubal ligation    . Colon tumor  1969    Benign rectal tumor excised  . Cataract extraction    . Refractive surgery    . Rotator cuff repair  2009    right by Dr. Madelon Lips  . Squamous cell removed  2013    Dr. Londell Moh --removed from her right cheek    Outpatient Encounter Prescriptions as of 09/04/2013  Medication Sig Dispense Refill  . aspirin 325 MG tablet Take 325 mg by mouth daily.        . carvedilol (COREG) 3.125 MG tablet Take one tablet by mouth daily as directed      . Cholecalciferol (VITAMIN D) 400 UNITS capsule Take 2 capsules by mouth daily       . cycloSPORINE (RESTASIS) 0.05 % ophthalmic emulsion 1 drop 2 (two) times daily.        Marland Kitchen estrogen, conjugated,-medroxyprogesterone (PREMPRO) 0.45-1.5 MG per tablet Take 1 tablet by mouth daily.  90 tablet  3  . fish oil-omega-3 fatty acids 1000 MG capsule Take one capsule by mouth two times daily      . fluocinonide cream (LIDEX) 0.05 % USE AS DIRECTED      . hydrocortisone (CVS CORTISONE MAXIMUM STRENGTH) 1 % ointment Apply 1 application topically 2 (two) times daily.      . naproxen sodium (ANAPROX) 220 MG tablet Take 220 mg by mouth daily.       Marland Kitchen acyclovir (ZOVIRAX) 800 MG tablet Take as directed when pt has an outbreak       . azithromycin (ZITHROMAX Z-PAK) 250 MG tablet Take 2 tablets (500 mg) on  Day 1,  followed by 1 tablet (250 mg) once daily on Days 2 through 5.  6 each  0   No facility-administered encounter medications on file as of 09/04/2013.    Allergies  Allergen Reactions  . Diamox [Acetazolamide] Nausea Only  . Epinephrine-Lidocaine-Na Metabisulfite [Lidocaine-Epinephrine] Other (See Comments)    Atrial fibrillation  . Erythromycin     REACTION: severe nausea  . Penicillins     REACTION: unsure of reaction  . Sulfonamide Derivatives      REACTION: nausea-elevated fever--kidney problems    Current Medications, Allergies, Past Medical History, Past Surgical History, Family History, and Social History were reviewed in [employer] record.     Review of Systems         See HPI - all other systems neg except as noted...  The patient denies anorexia, fever, weight loss, weight gain, vision loss, decreased hearing, hoarseness, chest pain, syncope, dyspnea on exertion, peripheral  edema, prolonged cough, headaches, hemoptysis, abdominal pain, melena, hematochezia, severe indigestion/heartburn, hematuria, incontinence, muscle weakness, suspicious skin lesions, transient blindness, difficulty walking, depression, unusual weight change, abnormal bleeding, enlarged lymph nodes, and angioedema.    Objective:   Physical Exam    WD, WN, 79 y/o WF in NAD... GENERAL:  Alert & oriented; pleasant & cooperative... HEENT:  [place]/AT,   EACs-clear, TMs-wnl, NOSE-clear drainage max sinus tenderness  THROAT-clear & wnl. NECK:  Supple w/ fairROM; no JVD; normal carotid impulses w/o bruits; no thyromegaly or nodules palpated; no lymphadenopathy. CHEST:  Clear to P & A; without wheezes/ rales/ or rhonchi. HEART:  Reg , without murmurs/ rubs/ or gallops heard... ABDOMEN:  Soft & nontender; normal bowel sounds; no organomegaly or masses detected. EXT: without deformities, mild arthritic changes; no varicose veins/ venous insuffic/ or edema. NEURO:   gait normal & balance OK. DERM:  No lesions noted; no rash etc...   Assessment & Plan:

## 2013-09-04 NOTE — Patient Instructions (Addendum)
Zpack take as directed  Mucinex DM Twice daily  As needed  Cough/congestion  Saline nasal rinses As needed   Fluids and rest  Please contact office for sooner follow up if symptoms do not improve or worsen or seek emergency care  follow up Dr. Kriste Basque  As planned and As needed

## 2013-09-13 ENCOUNTER — Ambulatory Visit (INDEPENDENT_AMBULATORY_CARE_PROVIDER_SITE_OTHER): Payer: Medicare Other

## 2013-09-13 DIAGNOSIS — Z23 Encounter for immunization: Secondary | ICD-10-CM

## 2013-10-31 DIAGNOSIS — R209 Unspecified disturbances of skin sensation: Secondary | ICD-10-CM | POA: Diagnosis not present

## 2013-10-31 DIAGNOSIS — I8310 Varicose veins of unspecified lower extremity with inflammation: Secondary | ICD-10-CM | POA: Diagnosis not present

## 2013-10-31 DIAGNOSIS — D1801 Hemangioma of skin and subcutaneous tissue: Secondary | ICD-10-CM | POA: Diagnosis not present

## 2013-10-31 DIAGNOSIS — D239 Other benign neoplasm of skin, unspecified: Secondary | ICD-10-CM | POA: Diagnosis not present

## 2013-10-31 DIAGNOSIS — Z85828 Personal history of other malignant neoplasm of skin: Secondary | ICD-10-CM | POA: Diagnosis not present

## 2013-10-31 DIAGNOSIS — L570 Actinic keratosis: Secondary | ICD-10-CM | POA: Diagnosis not present

## 2013-10-31 DIAGNOSIS — L821 Other seborrheic keratosis: Secondary | ICD-10-CM | POA: Diagnosis not present

## 2013-11-05 ENCOUNTER — Ambulatory Visit (INDEPENDENT_AMBULATORY_CARE_PROVIDER_SITE_OTHER): Payer: Medicare Other | Admitting: Pulmonary Disease

## 2013-11-05 ENCOUNTER — Encounter: Payer: Self-pay | Admitting: Pulmonary Disease

## 2013-11-05 VITALS — BP 140/72 | HR 89 | Temp 98.6°F | Ht 62.0 in | Wt 118.4 lb

## 2013-11-05 DIAGNOSIS — M899 Disorder of bone, unspecified: Secondary | ICD-10-CM

## 2013-11-05 DIAGNOSIS — I059 Rheumatic mitral valve disease, unspecified: Secondary | ICD-10-CM

## 2013-11-05 DIAGNOSIS — I4949 Other premature depolarization: Secondary | ICD-10-CM | POA: Diagnosis not present

## 2013-11-05 DIAGNOSIS — E7800 Pure hypercholesterolemia, unspecified: Secondary | ICD-10-CM

## 2013-11-05 DIAGNOSIS — K219 Gastro-esophageal reflux disease without esophagitis: Secondary | ICD-10-CM

## 2013-11-05 DIAGNOSIS — T7840XA Allergy, unspecified, initial encounter: Secondary | ICD-10-CM

## 2013-11-05 DIAGNOSIS — D126 Benign neoplasm of colon, unspecified: Secondary | ICD-10-CM

## 2013-11-05 DIAGNOSIS — H04129 Dry eye syndrome of unspecified lacrimal gland: Secondary | ICD-10-CM

## 2013-11-05 DIAGNOSIS — IMO0001 Reserved for inherently not codable concepts without codable children: Secondary | ICD-10-CM

## 2013-11-05 DIAGNOSIS — I4891 Unspecified atrial fibrillation: Secondary | ICD-10-CM

## 2013-11-05 DIAGNOSIS — M3500 Sicca syndrome, unspecified: Secondary | ICD-10-CM

## 2013-11-05 DIAGNOSIS — K5730 Diverticulosis of large intestine without perforation or abscess without bleeding: Secondary | ICD-10-CM

## 2013-11-05 NOTE — Patient Instructions (Signed)
Today we updated your med list in our EPIC system...    Continue your current medications the same...  Please return to our lab one morning this week for your FASTING blood work...    We will contact you w/ the results when available...   Call for any questions...  Let's plan a follow up visit in 6mo, sooner if needed for problems...   

## 2013-11-05 NOTE — Progress Notes (Signed)
Subjective:    Patient ID: Bailey [name], female    DOB: 11-12-1933, 79 y.o.   MRN: [id number]  HPI 79 y/o WF here for a follow up visit... she has multiple medical problems as noted below...    ~  May 01, 2012:  70mo ROV & Bailey [name] is doing well- no new complaints or concerns; she had a wonderful trip to [place] to visit relatives & wedding of a cousin's son; her granddaughter is getting married in [place] here & she is very busy... We reviewed prob list, meds, xrays and labs> see below>>  ~  October 31, 2012:  61mo ROV & Bailey [name] has been stable over the last 61mo, wonderful trip to [place] for wedding & did satis...  We reviewed the following problems for today's visit:     Dry eyes, Sjogren's, OA, & FM> followed by [name] on Restasis, Anaprox; she was seen 8/13- stable, no changes made...    Cardiac- MVP, palpit, HxPAF, mild AI> on ASA325, Coreg3.125/d; followed by Walker Kehr, seen 8/13- stable, no changes made, f/u 71yr; she denies CP, palpit, SOB, edema.    Chol> on FishOil & diet; FLP shows TChol 187, TG 88, HDL 92, LDL 77    GI- GERD, Divertics, IBS, polyps> on Prilosec prn; had colonoscopy 8/13 [name] w/ mod divertics in sigmoid, otherw neg; Rec to take hi fiber diet.    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    Osteopenia> on VitD supplement & VitD level = 56; BMD at [facility] 7/13 showed TScore -1.7 in left [facility] & [name] offered Fosamax; [name] told her "not to worry" (note from 8/13 reviewed)... We reviewed prob list, meds, xrays and labs> see below for updates >> OK Flu vaccine today... CXR 11/13 showed normal heart size, bilat apical pleuroparenchymal scarring, stable/ NAD... LABS 11/13:  FLP- at goals on diet alone;  Chems- wnl;  CBC- wnl;  TSH= 3.02;  VitD=56  ~  May 01, 2013:  61mo ROV & Bailey [name] has been doing well- notes one epis dizziness that lasted seconds & not assoc w/ CP, palpit,  position change, etc... She continues to f/u w/ [name] for rheum- Sjogren's, Raynauds, but doing reasonably well w/o new problems... She is enjoying travel, family, yard, etc... We reviewed the following medical problems during today's office visit >>     Dry eyes, Sjogren's, OA, & FM> followed by [name] on Restasis, Anaprox; she was seen 1/14- stable, no changes made...    Cardiac- MVP, palpit, HxPAF, mild AI> on ASA325, Coreg3.125/d; followed by Walker Kehr, seen 8/13- stable, no changes made, f/u 56yr; she denies CP, palpit, SOB, edema.    Chol> on FishOil & diet; FLP 11/13 showed TChol 187, TG 88, HDL 92, LDL 77    GI- GERD, Divertics, IBS, polyps> on Prilosec prn; had colonoscopy 8/13 [name] w/ mod divertics in sigmoid, otherw neg; Rec to take hi fiber diet.    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    Osteopenia> on VitD supplement & VitD level = 56; BMD at [facility] 7/13 showed TScore -1.7 in left [facility] & [name] offered Fosamax; [name] told her "not to worry" (note from 8/13 reviewed) 7 she continues on the Prempro, calcium, MVI, VitD... We reviewed prob list, meds, xrays and labs> see below for updates >>  ~  November 05, 2013:  45mo ROV & [name] notes that she is still fatigued but thinks it's prob her age; she had sinusitis/AB 10/14 treated by P w/ ZPak, Mucinex; c/o rash on legs, saw Derm- told it's stasis dermatitis & support hose helps...    Dry eyes, Sjogren's, OA, & FM> followed by [name] on Restasis, Anaprox; she was seen 7/14- stable, no changes made...    Cardiac- MVP, palpit, HxPAF, mild AI> on ASA325, Coreg3.125/d; followed by Walker Kehr, seen 10/14- stable, holding NSR, no changes made, f/u 40yr; she denies CP, palpit, SOB, edema.    Chol> on FishOil & diet; FLP 12/14 showed TChol 188, TG 76, HDL 108, LDL 65    GI- GERD, Divertics, IBS, polyps> on Prilosec prn; had  colonoscopy 8/13 [name] w/ mod divertics in sigmoid, otherw neg; Rec to take hi fiber diet.    GYN- followed by [name] & seen 8/14- atophic vaginitis on Prempro0.45 & they discussed the HRT risks; some urinary stress incont...    Rheum- followed by Rober Minion w/ Sjogrens, OA, LBP, Raynauds; on Anaprox & Voltaren gel; pt declines Pilocarpine & Plaquenil rx; 7/14 note reviewed...    Osteopenia> on VitD supplement & VitD level = 56; BMD at [facility] 7/13 showed TScore -1.7 in left [facility] & [name] offered Fosamax; [name] told her "not to worry" (note from 8/13 reviewed) & she continues on the Prempro, calcium, MVI, VitD... We reviewed prob list, meds, xrays and labs> see below for updates >> she had the 2014 Flu vaccine in Oct... LABS 12/14:  FLP- at goals on diet & FishOil;  Chems- wnl;  CBC- ok w/ Hg=12.2 & rec to take 1-a-day w/ Fe;  TSH=3.97...           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  ALLERGY (ICD-995.3) - stable w/ OTC meds Prn... she saw DrJacobs in 2006 for sinusitis. ~  CXR 9/10 showed clear lungs, NAD, mild degen changes in spine... ~  CXR 11/13 showed normal heart size, bilat apical pleuroparenchymal scarring, stable/ NAD...   PALPITATIONS (ICD-785.1) - Cardiac eval DrNishan- now on COREG 3.125mg  Qam... ~  notes from [place] & DrAllred reviewed> followed for palpit, hx PVCs, hx PAF, mild MVP, mild AI. ~  on ASA325, Coreg3.125/d; followed by Walker Kehr, seen 10/14- stable, holding NSR, no changes made, f/u 91yr; she denies CP, palpit, SOB, edema.  Hx of MITRAL VALVE PROLAPSE & MILD AI  Hx of PAROXYSMAL ATRIAL FIBRILLATION - on ASA 325mg /d... she denies CP, notes occas "heavy" feeling, rare palpit, she is very active without difficulty... ~  2DEcho 12/09 did not show MVP- norm LVF, EF= 60%, no regional abn, mild calcif AoV w/ mild AI, mild MR, incr PA sys 37-41 range... ~  EKG 7/12  showed NSR, rate70, ?LAE, otherw wnl... ~  2DEcho 8/12 showed norm LVF w/ EF=60-65% & no wall motion abn; Gr1DD, mild AI, trivial MR, otherw wnl & RVsys=28...  HYPERCHOLESTEROLEMIA, BORDERLINE (ICD-272.0) - on diet + FishOil... but she has a high HDL in the 75-105 range. ~  FLP 6/08 showed TChol 215, TG 78, HDL 99, LDL 88 ~  FLP 10/09 showed TChol 196, TG 100, HDL 82, LDL 94 ~  FLP 9/11 showed TChol 189, TG 73, HDL 84, LDL 90 ~  FLP 10/12 showed TChol 175, TG 64, HDL 86, LDL 76 ~  FLP 11/13 showed TChol 187, TG 88, HDL 92, LDL 77  ~  FLP 12/14 on diet  alone showed TChol 188, TG 76, HDL 108, LDL 65  GERD (ICD-530.81) - she had a HH repair in [place] 40+ yrs ago... occas takes OTC Prilosec Prn now...  DIVERTICULITIS, COLON (ICD-562.11) IRRITABLE BOWEL SYNDROME (ICD-564.1) COLONIC POLYPS (ICD-211.3)  ~  Colonoscopy 8/06 showed divertics only, no recurrent polyps... last polyp removed 8/03= adenomatous... ~  Colonoscopy 8/13 [name] w/ mod divertics in sigmoid, otherw neg; Rec to take hi fiber diet.   OSTEOARTHRITIS - Rheumatology eval by [name]> she has Raynaud's, Sjogren's, OA, & right shoulder impingement syndrome...  ~  s/p right shoulder surg 2009 by DrCaffrey... ~  9/10:  she also describes Raynaud's phenomenon & has dry eye ?sicca syndrome?... ~  10/10:  she had lat epicondylitis right elbow... ~  8/12: f/u Rheum OA, SICCA syndrome, Raynaud's, bursitis; she uses Aleve & OA supplements... ~  8/13 & 1/14: she had f/u [name]- stable, no changes made... ~   followed by [name] w/ Sjogrens, OA, LBP, Raynauds; on Anaprox & Voltaren gel; pt declines Pilocarpine & Plaquenil rx; 7/14 note reviewed...  ? of FIBROMYALGIA (ICD-729.1) - she states she's had this "all my life" yet she is very active and notes that "exercises help"...  OSTEOPENIA (ICD-733.90) - on Calcium supplements and Vit D OTC... her BMD's are done by [name] & pt reports that last BMD in 2011 was "stable"...  ~   9/11:  Vit D level = 52 ~  9/12:  Vit D level = 54 ~  7/13:  BMD at [facility] showed TScore -1.7 in left [facility] & [name] offered Fosamax; [name] told her "not to worry" (note from 8/13 reviewed)... ~  11/13:  Vit D level = 56  Health Maintenance -  she sees [name] for GYN- he does her PAP smears, Mammograms, and BMD's... she tells me that her VitD level per GYN was OK and she takes MVI + extra D OTC... she is on [facility] per Gyn... she got the Shingles vaccine at health dept in 2008... 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 ~  IMMUNIZATIONS:  she had PNEUMOVAX 2008 @ age 88... she gets yearly Flu vaccine... OK TDAP 3/11 here...   Past Surgical History  Procedure Laterality Date  . Cholecystectomy    . Appendectomy    . Hiatal hernia repair    . Tubal ligation    . Colon tumor  1969    Benign rectal tumor excised  . Cataract extraction    . Refractive surgery    . Rotator cuff repair  2009    right by Dr. Madelon Lips  . Squamous cell removed  2013    Dr. Londell Moh --removed from her right cheek    Outpatient Encounter Prescriptions as of 11/05/2013  Medication Sig  . acyclovir (ZOVIRAX) 800 MG tablet Take as directed when pt has an outbreak   . aspirin 325 MG tablet Take 325 mg by mouth daily.    . Capsaicin 0.035 % CREA Apply 1 application topically daily.  . carvedilol (COREG) 3.125 MG tablet Take one tablet by mouth daily as directed  . Cholecalciferol (VITAMIN D) 400 UNITS capsule Take 2 capsules by mouth daily   . cycloSPORINE (RESTASIS) 0.05 % ophthalmic emulsion 1 drop 2 (two) times daily.    Marland Kitchen estrogen, conjugated,-medroxyprogesterone (PREMPRO) 0.45-1.5 MG per tablet Take 1 tablet by mouth daily.  . fish oil-omega-3 fatty acids 1000 MG capsule Take one capsule by mouth two times daily  . fluocinonide cream (LIDEX) 0.05 % USE AS  DIRECTED  . hydrocortisone (CVS CORTISONE MAXIMUM STRENGTH) 1 % ointment Apply 1 application topically 2 (two) times daily.  .  naproxen sodium (ANAPROX) 220 MG tablet Take 220 mg by mouth daily.     Allergies  Allergen Reactions  . Diamox [Acetazolamide] Nausea Only  . Epinephrine-Lidocaine-Na Metabisulfite [Lidocaine-Epinephrine] Other (See Comments)    Atrial fibrillation  . Erythromycin     REACTION: severe nausea  . Penicillins     REACTION: unsure of reaction  . Sulfonamide Derivatives     REACTION: nausea-elevated fever--kidney problems    Current Medications, Allergies, Past Medical History, Past Surgical History, Family History, and Social History were reviewed in [employer] record.     Review of Systems         See HPI - all other systems neg except as noted...  The patient denies anorexia, fever, weight loss, weight gain, vision loss, decreased hearing, hoarseness, chest pain, syncope, dyspnea on exertion, peripheral edema, prolonged cough, headaches, hemoptysis, abdominal pain, melena, hematochezia, severe indigestion/heartburn, hematuria, incontinence, muscle weakness, suspicious skin lesions, transient blindness, difficulty walking, depression, unusual weight change, abnormal bleeding, enlarged lymph nodes, and angioedema.    Objective:   Physical Exam    WD, WN, 79 y/o WF in NAD... GENERAL:  Alert & oriented; pleasant & cooperative... HEENT:  [place]/AT, EOM-wnl, PERRLA, EACs-clear, TMs-wnl, NOSE-clear, THROAT-clear & wnl. NECK:  Supple w/ fairROM; no JVD; normal carotid impulses w/o bruits; no thyromegaly or nodules palpated; no lymphadenopathy. CHEST:  Clear to P & A; without wheezes/ rales/ or rhonchi. HEART:  sl irreg w/ PB's noted; without murmurs/ rubs/ or gallops heard... ABDOMEN:  Soft & nontender; normal bowel sounds; no organomegaly or masses detected. EXT: without deformities, mild arthritic changes; no varicose veins/ venous insuffic/ or edema. NEURO:  CN's intact; motor testing normal; sensory testing normal; gait normal & balance OK. DERM:  No lesions noted;  no rash etc...  RADIOLOGY DATA:  Reviewed in the EPIC EMR & discussed w/ the patient...  LABORATORY DATA:  Reviewed in the EPIC EMR & discussed w/ the patient...   Assessment & Plan:    Sjogren's syndrome>  She uses Restasis, keeps lozenges & water handy, etc...  MVP, Mild AI, HxPAF>  Hx palpit controlled w/ Coreg 3.125mg  once daily, no CP/SOB/ etc...  LIPIDS>  On diet + fish oil, she has excellent HDL & all parameters are WNL on diet rx...  GI>  GERD, Divertics, IBS, Polyps> she is up to date & doing satis...  GYN- Osteopenia> on Prempro from [name] along w/ Prempro, calcium, MVI, VitD...  RHEUM>  Followed by Rober Minion, notes reviewed, continue OTC meds Prn...  Other medical issues as noted...   Patient's Medications  New Prescriptions   No medications on file  Previous Medications   ACYCLOVIR (ZOVIRAX) 800 MG TABLET    Take as directed when pt has an outbreak    ASPIRIN 325 MG TABLET    Take 325 mg by mouth daily.     CAPSAICIN 0.035 % CREA    Apply 1 application topically daily.   CARVEDILOL (COREG) 3.125 MG TABLET    Take one tablet by mouth daily as directed   CHOLECALCIFEROL (VITAMIN D) 400 UNITS CAPSULE    Take 2 capsules by mouth daily    CYCLOSPORINE (RESTASIS) 0.05 % OPHTHALMIC EMULSION    1 drop 2 (two) times daily.     ESTROGEN, CONJUGATED,-MEDROXYPROGESTERONE (PREMPRO) 0.45-1.5 MG PER TABLET    Take 1 tablet by mouth daily.  FISH OIL-OMEGA-3 FATTY ACIDS 1000 MG CAPSULE    Take one capsule by mouth two times daily   FLUOCINONIDE CREAM (LIDEX) 0.05 %    USE AS DIRECTED   HYDROCORTISONE (CVS CORTISONE MAXIMUM STRENGTH) 1 % OINTMENT    Apply 1 application topically 2 (two) times daily.   NAPROXEN SODIUM (ANAPROX) 220 MG TABLET    Take 220 mg by mouth daily.   Modified Medications   No medications on file  Discontinued Medications   No medications on file

## 2013-11-08 ENCOUNTER — Other Ambulatory Visit (INDEPENDENT_AMBULATORY_CARE_PROVIDER_SITE_OTHER): Payer: Medicare Other

## 2013-11-08 DIAGNOSIS — I059 Rheumatic mitral valve disease, unspecified: Secondary | ICD-10-CM

## 2013-11-08 DIAGNOSIS — I4891 Unspecified atrial fibrillation: Secondary | ICD-10-CM | POA: Diagnosis not present

## 2013-11-08 DIAGNOSIS — E7800 Pure hypercholesterolemia, unspecified: Secondary | ICD-10-CM | POA: Diagnosis not present

## 2013-11-08 DIAGNOSIS — D126 Benign neoplasm of colon, unspecified: Secondary | ICD-10-CM

## 2013-11-08 DIAGNOSIS — M19079 Primary osteoarthritis, unspecified ankle and foot: Secondary | ICD-10-CM | POA: Diagnosis not present

## 2013-11-08 DIAGNOSIS — M19049 Primary osteoarthritis, unspecified hand: Secondary | ICD-10-CM | POA: Diagnosis not present

## 2013-11-08 DIAGNOSIS — M3500 Sicca syndrome, unspecified: Secondary | ICD-10-CM | POA: Diagnosis not present

## 2013-11-08 DIAGNOSIS — I7300 Raynaud's syndrome without gangrene: Secondary | ICD-10-CM | POA: Diagnosis not present

## 2013-11-08 LAB — HEPATIC FUNCTION PANEL
AST: 17 U/L (ref 0–37)
Albumin: 3.8 g/dL (ref 3.5–5.2)
Alkaline Phosphatase: 43 U/L (ref 39–117)
Bilirubin, Direct: 0.1 mg/dL (ref 0.0–0.3)
Total Protein: 7.3 g/dL (ref 6.0–8.3)

## 2013-11-08 LAB — CBC WITH DIFFERENTIAL/PLATELET
Basophils Absolute: 0.1 10*3/uL (ref 0.0–0.1)
Eosinophils Relative: 1.9 % (ref 0.0–5.0)
Lymphocytes Relative: 27.3 % (ref 12.0–46.0)
MCV: 92.4 fl (ref 78.0–100.0)
Monocytes Relative: 8.2 % (ref 3.0–12.0)
Neutrophils Relative %: 61.8 % (ref 43.0–77.0)
Platelets: 286 10*3/uL (ref 150.0–400.0)
RDW: 13.4 % (ref 11.5–14.6)
WBC: 8.4 10*3/uL (ref 4.5–10.5)

## 2013-11-08 LAB — LIPID PANEL
Cholesterol: 188 mg/dL (ref 0–200)
HDL: 108.3 mg/dL (ref >39.00)
LDL Cholesterol: 65 mg/dL (ref 0–99)
Total CHOL/HDL Ratio: 2
VLDL: 15.2 mg/dL (ref 0.0–40.0)

## 2013-11-08 LAB — BASIC METABOLIC PANEL
BUN: 19 mg/dL (ref 6–23)
CO2: 28 mEq/L (ref 19–32)
Creatinine, Ser: 0.9 mg/dL (ref 0.4–1.2)
GFR: 66.89 mL/min (ref >60.00)
Glucose, Bld: 98 mg/dL (ref 70–99)
Potassium: 4.1 mEq/L (ref 3.5–5.1)
Sodium: 139 mEq/L (ref 135–145)

## 2013-11-08 LAB — TSH: TSH: 3.97 u[IU]/mL (ref 0.35–5.50)

## 2014-01-10 DIAGNOSIS — Z961 Presence of intraocular lens: Secondary | ICD-10-CM | POA: Diagnosis not present

## 2014-01-10 DIAGNOSIS — H04129 Dry eye syndrome of unspecified lacrimal gland: Secondary | ICD-10-CM | POA: Diagnosis not present

## 2014-01-13 ENCOUNTER — Other Ambulatory Visit: Payer: Self-pay | Admitting: Cardiovascular Disease

## 2014-04-02 DIAGNOSIS — M19049 Primary osteoarthritis, unspecified hand: Secondary | ICD-10-CM | POA: Diagnosis not present

## 2014-04-02 DIAGNOSIS — M3500 Sicca syndrome, unspecified: Secondary | ICD-10-CM | POA: Diagnosis not present

## 2014-04-02 DIAGNOSIS — I7300 Raynaud's syndrome without gangrene: Secondary | ICD-10-CM | POA: Diagnosis not present

## 2014-04-02 DIAGNOSIS — M19079 Primary osteoarthritis, unspecified ankle and foot: Secondary | ICD-10-CM | POA: Diagnosis not present

## 2014-05-06 ENCOUNTER — Ambulatory Visit: Payer: Medicare Other | Admitting: Pulmonary Disease

## 2014-05-13 ENCOUNTER — Encounter: Payer: Self-pay | Admitting: Pulmonary Disease

## 2014-05-13 ENCOUNTER — Ambulatory Visit (INDEPENDENT_AMBULATORY_CARE_PROVIDER_SITE_OTHER): Payer: Medicare Other | Admitting: Pulmonary Disease

## 2014-05-13 VITALS — BP 110/64 | HR 54 | Temp 98.8°F | Ht 62.0 in | Wt 115.8 lb

## 2014-05-13 DIAGNOSIS — K5730 Diverticulosis of large intestine without perforation or abscess without bleeding: Secondary | ICD-10-CM

## 2014-05-13 DIAGNOSIS — E559 Vitamin D deficiency, unspecified: Secondary | ICD-10-CM | POA: Diagnosis not present

## 2014-05-13 DIAGNOSIS — R5381 Other malaise: Secondary | ICD-10-CM | POA: Diagnosis not present

## 2014-05-13 DIAGNOSIS — D126 Benign neoplasm of colon, unspecified: Secondary | ICD-10-CM

## 2014-05-13 DIAGNOSIS — IMO0001 Reserved for inherently not codable concepts without codable children: Secondary | ICD-10-CM

## 2014-05-13 DIAGNOSIS — R300 Dysuria: Secondary | ICD-10-CM | POA: Diagnosis not present

## 2014-05-13 DIAGNOSIS — Z79899 Other long term (current) drug therapy: Secondary | ICD-10-CM | POA: Diagnosis not present

## 2014-05-13 DIAGNOSIS — I4949 Other premature depolarization: Secondary | ICD-10-CM

## 2014-05-13 DIAGNOSIS — K219 Gastro-esophageal reflux disease without esophagitis: Secondary | ICD-10-CM

## 2014-05-13 DIAGNOSIS — M3500 Sicca syndrome, unspecified: Secondary | ICD-10-CM

## 2014-05-13 DIAGNOSIS — I059 Rheumatic mitral valve disease, unspecified: Secondary | ICD-10-CM

## 2014-05-13 DIAGNOSIS — M949 Disorder of cartilage, unspecified: Secondary | ICD-10-CM

## 2014-05-13 DIAGNOSIS — T7840XA Allergy, unspecified, initial encounter: Secondary | ICD-10-CM | POA: Diagnosis not present

## 2014-05-13 DIAGNOSIS — I4891 Unspecified atrial fibrillation: Secondary | ICD-10-CM

## 2014-05-13 DIAGNOSIS — H04129 Dry eye syndrome of unspecified lacrimal gland: Secondary | ICD-10-CM

## 2014-05-13 DIAGNOSIS — M899 Disorder of bone, unspecified: Secondary | ICD-10-CM

## 2014-05-13 DIAGNOSIS — R5383 Other fatigue: Secondary | ICD-10-CM | POA: Diagnosis not present

## 2014-05-13 DIAGNOSIS — M069 Rheumatoid arthritis, unspecified: Secondary | ICD-10-CM

## 2014-05-13 LAB — BASIC METABOLIC PANEL
BUN: 19 mg/dL (ref 4–21)
CREATININE: 0.9 mg/dL (ref <=1.1)
Glucose: 98 mg/dL
Potassium: 4.6 mmol/L (ref 3.4–5.3)
Sodium: 139 mmol/L (ref 137–147)

## 2014-05-13 LAB — CBC AND DIFFERENTIAL
HEMATOCRIT: 36 % (ref 36–46)
Hemoglobin: 12.2 g/dL (ref 12.0–16.0)
PLATELETS: 325 10*3/uL (ref 150–399)
WBC: 9.4 10*3/mL

## 2014-05-13 LAB — HEPATIC FUNCTION PANEL
ALT: 8 U/L (ref 7–35)
AST: 13 U/L (ref 13–35)
Alkaline Phosphatase: 50 U/L (ref 25–125)
BILIRUBIN, TOTAL: 0.5 mg/dL

## 2014-05-13 MED ORDER — FLUTICASONE PROPIONATE 50 MCG/ACT NA SUSP: 2.0000 | Freq: Two times a day (BID) | NASAL | Status: DC

## 2014-05-13 NOTE — Progress Notes (Signed)
Subjective:    Patient ID: Bailey [name], female    DOB: 05/11/1933, 80 y.o.   MRN: [id number]  HPI 80 y/o WF here for a follow up visit... she has multiple medical problems as noted below...    ~  October 31, 2012:  91mo ROV & Bailey [name] has been stable over the last 44mo, wonderful trip to [place] for wedding & did satis...  We reviewed the following problems for today's visit:     Dry eyes, Sjogren's, OA, & FM> followed by [name] on Restasis, Anaprox; she was seen 8/13- stable, no changes made...    Cardiac- MVP, palpit, HxPAF, mild AI> on ASA325, Coreg3.125/d; followed by [name], seen 8/13- stable, no changes made, f/u 44yr; she denies CP, palpit, SOB, edema.    Chol> on FishOil & diet; FLP shows TChol 187, TG 88, HDL 92, LDL 77    GI- GERD, Divertics, IBS, polyps> on Prilosec prn; had colonoscopy 8/13 [name] w/ mod divertics in sigmoid, otherw neg; Rec to take hi fiber diet.    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    Osteopenia> on VitD supplement & VitD level = 56; BMD at [facility] 7/13 showed TScore -1.7 in left [facility] & [name] offered Fosamax; [name] told her "not to worry" (note from 8/13 reviewed)... We reviewed prob list, meds, xrays and labs> see below for updates >> OK Flu vaccine today...  CXR 11/13 showed normal heart size, bilat apical pleuroparenchymal scarring, stable/ NAD...  LABS 11/13:  FLP- at goals on diet alone;  Chems- wnl;  CBC- wnl;  TSH= 3.02;  VitD=56  ~  May 01, 2013:  62mo ROV & Bailey [name] has been doing well- notes one epis dizziness that lasted seconds & not assoc w/ CP, palpit, position change, etc... She continues to f/u w/ [name] for rheum- Sjogren's, Raynauds, but doing reasonably well w/o new problems... She is enjoying travel, family, yard, etc... We reviewed the following medical problems during today's office visit >>     Dry eyes, Sjogren's, OA, & FM>  followed by [name] on Restasis, Anaprox; she was seen 1/14- stable, no changes made...    Cardiac- MVP, palpit, HxPAF, mild AI> on ASA325, Coreg3.125/d; followed by [name], seen 8/13- stable, no changes made, f/u 27yr; she denies CP, palpit, SOB, edema.    Chol> on FishOil & diet; FLP 11/13 showed TChol 187, TG 88, HDL 92, LDL 77    GI- GERD, Divertics, IBS, polyps> on Prilosec prn; had colonoscopy 8/13 [name] w/ mod divertics in sigmoid, otherw neg; Rec to take hi fiber diet.    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    Osteopenia> on VitD supplement & VitD level = 56; BMD at [facility] 7/13 showed TScore -1.7 in left [facility] & [name] offered Fosamax; [name] told her "not to worry" (note from 8/13 reviewed) 7 she continues on the Prempro, calcium, MVI, VitD... We reviewed prob list, meds, xrays and labs> see below for updates >>  ~  November 05, 2013:  58mo ROV & Bailey [name] notes that she is still fatigued but thinks it's prob her age; she had sinusitis/AB 10/14 treated by P w/ ZPak, Mucinex; c/o rash on legs, saw Derm- told it's stasis dermatitis & support hose helps...    Dry eyes, Sjogren's, OA, & FM> followed by [name] on Restasis, Anaprox; she  was seen 7/14- stable, no changes made...    Cardiac- MVP, palpit, HxPAF, mild AI> on ASA325, Coreg3.125/d; followed by [name], seen 10/14- stable, holding NSR, no changes made, f/u 33yr; she denies CP, palpit, SOB, edema.    Chol> on FishOil & diet; FLP 12/14 showed TChol 188, TG 76, HDL 108, LDL 65    GI- GERD, Divertics, IBS, polyps> on Prilosec prn; had colonoscopy 8/13 [name] w/ mod divertics in sigmoid, otherw neg; Rec to take hi fiber diet.    GYN- followed by [name] & seen 8/14- atophic vaginitis on Prempro0.45 & they discussed the HRT risks; some urinary stress incont...    Rheum- followed by [name] w/ Sjogrens, OA, LBP, Raynauds;  on Anaprox & Voltaren gel; pt declines Pilocarpine & Plaquenil rx; 7/14 note reviewed...    Osteopenia> on VitD supplement & VitD level = 56; BMD at [facility] 7/13 showed TScore -1.7 in left [facility] & [name] offered Fosamax; [name] told her "not to worry" (note from 8/13 reviewed) & she continues on the Prempro, calcium, MVI, VitD... We reviewed prob list, meds, xrays and labs> see below for updates >> she had the 2014 Flu vaccine in Oct...  LABS 12/14:  FLP- at goals on diet & FishOil;  Chems- wnl;  CBC- ok w/ Hg=12.2 & rec to take 1-a-day w/ Fe;  TSH=3.97...  ~  May 13, 2014:  23mo ROV & [name] reports a good 44mo- just c/o tired, some post nasal drip, drainage and rec to try OTC rx w/ antihist, saline, Flonase... She appears somewhat anxious w/ flight of ideas mentioning drainage, c/o gums, noted one epis of bl in mouth ~66mo ago but worried because mother had nosebleed then stroke- we discussed all this, pt's BP is wnl, offered Neuro eval but she declined... We reviewed the following medical problems during today's office visit >>     Dry eyes, Sjogren's, OA, & FM> followed by [name] on Restasis, Anaprox; last note 7/14- stable, no changes made...    Cardiac- MVP, palpit, HxPAF, mild AI> on ASA325, Coreg3.125/d; followed by [name], last seen 10/14- stable, holding NSR, no changes made, f/u 40yr; she denies CP, palpit, SOB, edema.    Chol> on FishOil & diet; FLP 12/14 showed TChol 188, TG 76, HDL 108, LDL 65    GI- GERD, Divertics, IBS, polyps> on Prilosec prn; had colonoscopy 8/13 [name] w/ mod divertics in sigmoid, otherw neg; Rec to take hi fiber diet.    GYN- followed by [name] & seen 8/14- atophic vaginitis on Prempro0.45 & they discussed the HRT risks; some urinary stress incont...    Rheum- followed by [name] w/ Sjogrens, OA, LBP, Raynauds; on Anaprox & Voltaren gel; pt declined Plaquenil rx & is trying the Pilocarpine...    Osteopenia> on VitD supplement & VitD level = 56;  BMD at [facility] 7/13 showed TScore -1.7 in left [facility] & [name] offered Fosamax; [name] told her "not to worry" (note from 8/13 reviewed) & she continues on the Prempro, calcium, MVI, VitD... We reviewed prob list, meds, xrays and labs> see below for updates >>            Problem List:   DRY EYE SYNDROME (ICD-375.15) - on RESTASIS per Ophthalmology, & followed for Sjogren's by [name]...  ALLERGY (ICD-995.3) - stable w/ OTC meds Prn... she saw DrJacobs in 2006 for sinusitis. ~  CXR 9/10 showed clear lungs, NAD, mild degen changes in spine... ~  CXR 11/13 showed normal heart size, bilat apical pleuroparenchymal scarring, stable/  NAD.Marland KitchenMarland Kitchen   PALPITATIONS (ICD-785.1) - Cardiac eval DrNishan- now on COREG 3.125mg  Qam... ~  notes from [place] & DrAllred reviewed> followed for palpit, hx PVCs, hx PAF, mild MVP, mild AI. ~  on ASA325, Coreg3.125/d; followed by [name], seen 10/14- stable, holding NSR w/ PVCs, no changes made, f/u 28yr; she denies CP, palpit, SOB, edema.  Hx of MITRAL VALVE PROLAPSE & MILD AI  Hx of PAROXYSMAL ATRIAL FIBRILLATION - on ASA 325mg /d... she denies CP, notes occas "heavy" feeling, rare palpit, she is very active without difficulty... ~  2DEcho 12/09 did not show MVP- norm LVF, EF= 60%, no regional abn, mild calcif AoV w/ mild AI, mild MR, incr PA sys 37-41 range... ~  EKG 7/12 showed NSR, rate70, ?LAE, otherw wnl... ~  2DEcho 8/12 showed norm LVF w/ EF=60-65% & no wall motion abn; Gr1DD, mild AI, trivial MR, otherw wnl & RVsys=28... ~  EKG 10/14 showed SBrady w/ PVCs...  HYPERCHOLESTEROLEMIA, BORDERLINE (ICD-272.0) - on diet + FishOil... but she has a high HDL in the 75-105 range. ~  FLP 6/08 showed TChol 215, TG 78, HDL 99, LDL 88 ~  FLP 10/09 showed TChol 196, TG 100, HDL 82, LDL 94 ~  FLP 9/11 showed TChol 189, TG 73, HDL 84, LDL 90 ~  FLP 10/12 showed TChol 175, TG 64, HDL 86, LDL 76 ~  FLP 11/13 showed TChol 187, TG 88, HDL 92, LDL 77  ~  FLP 12/14 on  diet alone showed TChol 188, TG 76, HDL 108, LDL 65  GERD (ICD-530.81) - she had a HH repair in [place] 40+ yrs ago... occas takes OTC Prilosec Prn now...  DIVERTICULITIS, COLON (ICD-562.11) IRRITABLE BOWEL SYNDROME (ICD-564.1) COLONIC POLYPS (ICD-211.3)  ~  Colonoscopy 8/06 showed divertics only, no recurrent polyps... last polyp removed 8/03= adenomatous... ~  Colonoscopy 8/13 [name] w/ mod divertics in sigmoid, otherw neg; Rec to take hi fiber diet.   OSTEOARTHRITIS - Rheumatology eval by [name]> she has Raynaud's, Sjogren's, OA, & right shoulder impingement syndrome...  ~  s/p right shoulder surg 2009 by DrCaffrey... ~  9/10:  she also describes Raynaud's phenomenon & has dry eye ?sicca syndrome?... ~  10/10:  she had lat epicondylitis right elbow... ~  8/12: f/u Rheum OA, SICCA syndrome, Raynaud's, bursitis; she uses Aleve & OA supplements... ~  8/13 & 1/14: she had f/u [name]- stable, no changes made... ~   followed by [name] w/ Sjogrens, OA, LBP, Raynauds; on Anaprox & Voltaren gel; pt declines Pilocarpine & Plaquenil rx; 7/14 note reviewed...  ? of FIBROMYALGIA (ICD-729.1) - she states she's had this "all my life" yet she is very active and notes that "exercises help"...  OSTEOPENIA (ICD-733.90) - on Calcium supplements and Vit D OTC... her BMD's are done by [name] & pt reports that last BMD in 2011 was "stable"...  ~  9/11:  Vit D level = 52 ~  9/12:  Vit D level = 54 ~  7/13:  BMD at [facility] showed TScore -1.7 in left [facility] & [name] offered Fosamax; [name] told her "not to worry" (note from 8/13 reviewed)... ~  11/13:  Vit D level = 56  Health Maintenance -  she sees [name] for GYN- he does her PAP smears, Mammograms, and BMD's... she tells me that her VitD level per GYN was OK and she takes MVI + extra D OTC... she is on [facility] per Gyn... she got the Shingles vaccine at health dept in 2008... 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 ~  IMMUNIZATIONS:  she had PNEUMOVAX 2008 @ age 69... she gets yearly Flu vaccine... OK TDAP 3/11 here...   Past Surgical History  Procedure Laterality Date  . Cholecystectomy    . Appendectomy    . Hiatal hernia repair    . Tubal ligation    . Colon tumor  1969    Benign rectal tumor excised  . Cataract extraction    . Refractive surgery    . Rotator cuff repair  2009    right by Dr. French Ana  . Squamous cell removed  2013    Dr. Sherrye Payor --removed from her right cheek    Outpatient Encounter Prescriptions as of 05/13/2014  Medication Sig  . acyclovir (ZOVIRAX) 800 MG tablet Take as directed when pt has an outbreak   . aspirin 325 MG tablet Take 325 mg by mouth daily.    . Capsaicin 0.035 % CREA Apply 1 application topically daily.  . carvedilol (COREG) 3.125 MG tablet TAKE 1 TABLET BY MOUTH once A DAY WITH A MEAL  . Cholecalciferol (VITAMIN D) 400 UNITS capsule Take 2 capsules by mouth daily   . cycloSPORINE (RESTASIS) 0.05 % ophthalmic emulsion 1 drop 2 (two) times daily.    Marland Kitchen estrogen, conjugated,-medroxyprogesterone (PREMPRO) 0.45-1.5 MG per tablet Take 1 tablet by mouth daily.  . fish oil-omega-3 fatty acids 1000 MG capsule Take one capsule by mouth  daily  . fluocinonide cream (LIDEX) 0.05 % USE AS DIRECTED  . hydrocortisone ([employer] CORTISONE MAXIMUM STRENGTH) 1 % ointment Apply 1 application topically 2 (two) times daily.  . naproxen sodium (ANAPROX) 220 MG tablet Take 220 mg by mouth daily.   . [DISCONTINUED] carvedilol (COREG) 3.125 MG tablet TAKE 1 TABLET BY MOUTH TWICE A DAY WITH A MEAL    Allergies  Allergen Reactions  . Diamox [Acetazolamide] Nausea Only  . Epinephrine-Lidocaine-Na Metabisulfite [Lidocaine-Epinephrine] Other (See Comments)    Atrial fibrillation  . Erythromycin     REACTION: severe nausea  . Penicillins     REACTION: unsure of reaction  . Sulfonamide Derivatives     REACTION: nausea-elevated fever--kidney problems    Current  Medications, Allergies, Past Medical History, Past Surgical History, Family History, and Social History were reviewed in [employer] record.     Review of Systems         See HPI - all other systems neg except as noted...  The patient denies anorexia, fever, weight loss, weight gain, vision loss, decreased hearing, hoarseness, chest pain, syncope, dyspnea on exertion, peripheral edema, prolonged cough, headaches, hemoptysis, abdominal pain, melena, hematochezia, severe indigestion/heartburn, hematuria, incontinence, muscle weakness, suspicious skin lesions, transient blindness, difficulty walking, depression, unusual weight change, abnormal bleeding, enlarged lymph nodes, and angioedema.    Objective:   Physical Exam    WD, WN, 80 y/o WF in NAD... GENERAL:  Alert & oriented; pleasant & cooperative... HEENT:  [place]/AT, EOM-wnl, PERRLA, EACs-clear, TMs-wnl, NOSE-clear, THROAT-clear & wnl. NECK:  Supple w/ fairROM; no JVD; normal carotid impulses w/o bruits; no thyromegaly or nodules palpated; no lymphadenopathy. CHEST:  Clear to P & A; without wheezes/ rales/ or rhonchi. HEART:  sl irreg w/ PB's noted; without murmurs/ rubs/ or gallops heard... ABDOMEN:  Soft & nontender; normal bowel sounds; no organomegaly or masses detected. EXT: without deformities, mild arthritic changes; no varicose veins/ venous insuffic/ or edema. NEURO:  CN's intact; motor testing normal; sensory testing normal; gait normal & balance OK. DERM:  No lesions  noted; no rash etc...  RADIOLOGY DATA:  Reviewed in the EPIC EMR & discussed w/ the patient...  LABORATORY DATA:  Reviewed in the EPIC EMR & discussed w/ the patient...   Assessment & Plan:    Sjogren's syndrome>  She uses Restasis, keeps lozenges & water handy, etc...  MVP, Mild AI, HxPAF>  Hx palpit controlled w/ Coreg 3.125mg  once daily, no CP/SOB/ etc...  LIPIDS>  On diet + fish oil, she has excellent HDL & all parameters are WNL on  diet rx...  GI>  GERD, Divertics, IBS, Polyps> she is up to date & doing satis...  GYN- Osteopenia> on Prempro from [name] along w/ Prempro, calcium, MVI, VitD...  RHEUM>  Followed by [name], notes reviewed, continue OTC meds Prn...  Other medical issues as noted...   Patient's Medications  New Prescriptions   FLUTICASONE (FLONASE) 50 MCG/ACT NASAL SPRAY    Place 2 sprays into both nostrils 2 (two) times daily.  Previous Medications   ACYCLOVIR (ZOVIRAX) 800 MG TABLET    Take as directed when pt has an outbreak    ASPIRIN 325 MG TABLET    Take 325 mg by mouth daily.     CAPSAICIN 0.035 % CREA    Apply 1 application topically daily.   CHOLECALCIFEROL (VITAMIN D) 400 UNITS CAPSULE    Take 2 capsules by mouth daily    CYCLOSPORINE (RESTASIS) 0.05 % OPHTHALMIC EMULSION    1 drop 2 (two) times daily.     FISH OIL-OMEGA-3 FATTY ACIDS 1000 MG CAPSULE    Take one capsule by mouth  daily   FLUOCINONIDE CREAM (LIDEX) 0.05 %    USE AS DIRECTED   HYDROCORTISONE ([employer]S CORTISONE MAXIMUM STRENGTH) 1 % OINTMENT    Apply 1 application topically 2 (two) times daily.   NAPROXEN SODIUM (ANAPROX) 220 MG TABLET    Take 220 mg by mouth daily.   Modified Medications   Modified Medication Previous Medication   CARVEDILOL (COREG) 3.125 MG TABLET carvedilol (COREG) 3.125 MG tablet      TAKE 1 TABLET BY MOUTH once A DAY WITH A MEAL    TAKE 1 TABLET BY MOUTH TWICE A DAY WITH A MEAL   PREMPRO 0.45-1.5 MG PER TABLET estrogen, conjugated,-medroxyprogesterone (PREMPRO) 0.45-1.5 MG per tablet      TAKE 1 TABLET BY MOUTH DAILY.    Take 1 tablet by mouth daily.  Discontinued Medications   No medications on file

## 2014-05-13 NOTE — Patient Instructions (Signed)
Today we updated your med list in our EPIC system...    Continue your current medications the same...  For your sinuses & throat (drainage)>>    Try an antihistamine like claritin or Zyrtek daily...    Use a nasal saline mist every 1-2h as needed for the congestion...    Try the FLONASE nasal spray- 2 sprays in each nostril at bedtime...  Call for any questions...  Let's plan a follow up visit in 69mo, sooner if needed for problems.Marland KitchenMarland Kitchen

## 2014-06-04 ENCOUNTER — Telehealth: Payer: Self-pay | Admitting: *Deleted

## 2014-06-04 NOTE — Telephone Encounter (Signed)
Pt called requesting dexa order faxed to [facility], this was done 

## 2014-06-10 ENCOUNTER — Other Ambulatory Visit: Payer: Self-pay | Admitting: Gynecology

## 2014-06-29 DIAGNOSIS — M8580 Other specified disorders of bone density and structure, unspecified site: Secondary | ICD-10-CM

## 2014-06-29 HISTORY — DX: Other specified disorders of bone density and structure, unspecified site: M85.80

## 2014-07-04 DIAGNOSIS — M949 Disorder of cartilage, unspecified: Secondary | ICD-10-CM | POA: Diagnosis not present

## 2014-07-04 DIAGNOSIS — M899 Disorder of bone, unspecified: Secondary | ICD-10-CM | POA: Diagnosis not present

## 2014-07-04 DIAGNOSIS — Z1231 Encounter for screening mammogram for malignant neoplasm of breast: Secondary | ICD-10-CM | POA: Diagnosis not present

## 2014-07-05 ENCOUNTER — Encounter: Payer: Self-pay | Admitting: Gynecology

## 2014-07-09 ENCOUNTER — Encounter: Payer: Self-pay | Admitting: Gynecology

## 2014-07-16 ENCOUNTER — Ambulatory Visit (INDEPENDENT_AMBULATORY_CARE_PROVIDER_SITE_OTHER): Payer: Medicare Other | Admitting: Gynecology

## 2014-07-16 ENCOUNTER — Encounter: Payer: Self-pay | Admitting: Gynecology

## 2014-07-16 VITALS — BP 124/78 | Ht 62.0 in | Wt 115.0 lb

## 2014-07-16 DIAGNOSIS — N952 Postmenopausal atrophic vaginitis: Secondary | ICD-10-CM | POA: Diagnosis not present

## 2014-07-16 DIAGNOSIS — Z79890 Hormone replacement therapy (postmenopausal): Secondary | ICD-10-CM | POA: Diagnosis not present

## 2014-07-16 DIAGNOSIS — N816 Rectocele: Secondary | ICD-10-CM

## 2014-07-16 DIAGNOSIS — R82998 Other abnormal findings in urine: Secondary | ICD-10-CM | POA: Diagnosis not present

## 2014-07-16 MED ORDER — CONJ ESTROG-MEDROXYPROGEST ACE 0.45-1.5 MG PO TABS: ORAL_TABLET | ORAL | Status: DC

## 2014-07-16 NOTE — Patient Instructions (Signed)
Followup in 1 year for annual exam, sooner if any issues. Call if any vaginal bleeding.  You may obtain a copy of any labs that were done today by logging onto MyChart as outlined in the instructions provided with your AVS (after visit summary). The office will not call with normal lab results but certainly if there are any significant abnormalities then we will contact you.   Health Maintenance, Female A healthy lifestyle and preventative care can promote health and wellness.  Maintain regular health, dental, and eye exams.  Eat a healthy diet. Foods like vegetables, fruits, whole grains, low-fat dairy products, and lean protein foods contain the nutrients you need without too many calories. Decrease your intake of foods high in solid fats, added sugars, and salt. Get information about a proper diet from your caregiver, if necessary.  Regular physical exercise is one of the most important things you can do for your health. Most adults should get at least 150 minutes of moderate-intensity exercise (any activity that increases your heart rate and causes you to sweat) each week. In addition, most adults need muscle-strengthening exercises on 2 or more days a week.   Maintain a healthy weight. The body mass index (BMI) is a screening tool to identify possible weight problems. It provides an estimate of body fat based on height and weight. Your caregiver can help determine your BMI, and can help you achieve or maintain a healthy weight. For adults 20 years and older:  A BMI below 18.5 is considered underweight.  A BMI of 18.5 to 24.9 is normal.  A BMI of 25 to 29.9 is considered overweight.  A BMI of 30 and above is considered obese.  Maintain normal blood lipids and cholesterol by exercising and minimizing your intake of saturated fat. Eat a balanced diet with plenty of fruits and vegetables. Blood tests for lipids and cholesterol should begin at age 17 and be repeated every 5 years. If your  lipid or cholesterol levels are high, you are over 50, or you are a high risk for heart disease, you may need your cholesterol levels checked more frequently.Ongoing high lipid and cholesterol levels should be treated with medicines if diet and exercise are not effective.  If you smoke, find out from your caregiver how to quit. If you do not use tobacco, do not start.  Lung cancer screening is recommended for adults aged 39 80 years who are at high risk for developing lung cancer because of a history of smoking. Yearly low-dose computed tomography (CT) is recommended for people who have at least a 30-pack-year history of smoking and are a current smoker or have quit within the past 15 years. A pack year of smoking is smoking an average of 1 pack of cigarettes a day for 1 year (for example: 1 pack a day for 30 years or 2 packs a day for 15 years). Yearly screening should continue until the smoker has stopped smoking for at least 15 years. Yearly screening should also be stopped for people who develop a health problem that would prevent them from having lung cancer treatment.  If you are pregnant, do not drink alcohol. If you are breastfeeding, be very cautious about drinking alcohol. If you are not pregnant and choose to drink alcohol, do not exceed 1 drink per day. One drink is considered to be 12 ounces (355 mL) of beer, 5 ounces (148 mL) of wine, or 1.5 ounces (44 mL) of liquor.  Avoid use of street drugs.  Do not share needles with anyone. Ask for help if you need support or instructions about stopping the use of drugs.  High blood pressure causes heart disease and increases the risk of stroke. Blood pressure should be checked at least every 1 to 2 years. Ongoing high blood pressure should be treated with medicines, if weight loss and exercise are not effective.  If you are 91 to 78 years old, ask your caregiver if you should take aspirin to prevent strokes.  Diabetes screening involves taking a  blood sample to check your fasting blood sugar level. This should be done once every 3 years, after age 33, if you are within normal weight and without risk factors for diabetes. Testing should be considered at a younger age or be carried out more frequently if you are overweight and have at least 1 risk factor for diabetes.  Breast cancer screening is essential preventative care for women. You should practice "breast self-awareness." This means understanding the normal appearance and feel of your breasts and may include breast self-examination. Any changes detected, no matter how small, should be reported to a caregiver. Women in their 66s and 30s should have a clinical breast exam (CBE) by a caregiver as part of a regular health exam every 1 to 3 years. After age 50, women should have a CBE every year. Starting at age 31, women should consider having a mammogram (breast X-ray) every year. Women who have a family history of breast cancer should talk to their caregiver about genetic screening. Women at a high risk of breast cancer should talk to their caregiver about having an MRI and a mammogram every year.  Breast cancer gene (BRCA)-related cancer risk assessment is recommended for women who have family members with BRCA-related cancers. BRCA-related cancers include breast, ovarian, tubal, and peritoneal cancers. Having family members with these cancers may be associated with an increased risk for harmful changes (mutations) in the breast cancer genes BRCA1 and BRCA2. Results of the assessment will determine the need for genetic counseling and BRCA1 and BRCA2 testing.  The Pap test is a screening test for cervical cancer. Women should have a Pap test starting at age 58. Between ages 90 and 13, Pap tests should be repeated every 2 years. Beginning at age 17, you should have a Pap test every 3 years as long as the past 3 Pap tests have been normal. If you had a hysterectomy for a problem that was not cancer or  a condition that could lead to cancer, then you no longer need Pap tests. If you are between ages 16 and 9, and you have had normal Pap tests going back 10 years, you no longer need Pap tests. If you have had past treatment for cervical cancer or a condition that could lead to cancer, you need Pap tests and screening for cancer for at least 20 years after your treatment. If Pap tests have been discontinued, risk factors (such as a new sexual partner) need to be reassessed to determine if screening should be resumed. Some women have medical problems that increase the chance of getting cervical cancer. In these cases, your caregiver may recommend more frequent screening and Pap tests.  The human papillomavirus (HPV) test is an additional test that may be used for cervical cancer screening. The HPV test looks for the virus that can cause the cell changes on the cervix. The cells collected during the Pap test can be tested for HPV. The HPV test could be used  to screen women aged 74 years and older, and should be used in women of any age who have unclear Pap test results. After the age of 8, women should have HPV testing at the same frequency as a Pap test.  Colorectal cancer can be detected and often prevented. Most routine colorectal cancer screening begins at the age of 24 and continues through age 10. However, your caregiver may recommend screening at an earlier age if you have risk factors for colon cancer. On a yearly basis, your caregiver may provide home test kits to check for hidden blood in the stool. Use of a small camera at the end of a tube, to directly examine the colon (sigmoidoscopy or colonoscopy), can detect the earliest forms of colorectal cancer. Talk to your caregiver about this at age 35, when routine screening begins. Direct examination of the colon should be repeated every 5 to 10 years through age 31, unless early forms of pre-cancerous polyps or small growths are found.  Hepatitis C  blood testing is recommended for all people born from [birth year] through 1963 and any individual with known risks for hepatitis C.  Practice safe sex. Use condoms and avoid high-risk sexual practices to reduce the spread of sexually transmitted infections (STIs). Sexually active women aged 15 and younger should be checked for Chlamydia, which is a common sexually transmitted infection. Older women with new or multiple partners should also be tested for Chlamydia. Testing for other STIs is recommended if you are sexually active and at increased risk.  Osteoporosis is a disease in which the bones lose minerals and strength with aging. This can result in serious bone fractures. The risk of osteoporosis can be identified using a bone density scan. Women ages 56 and over and women at risk for fractures or osteoporosis should discuss screening with their caregivers. Ask your caregiver whether you should be taking a calcium supplement or vitamin D to reduce the rate of osteoporosis.  Menopause can be associated with physical symptoms and risks. Hormone replacement therapy is available to decrease symptoms and risks. You should talk to your caregiver about whether hormone replacement therapy is right for you.  Use sunscreen. Apply sunscreen liberally and repeatedly throughout the day. You should seek shade when your shadow is shorter than you. Protect yourself by wearing long sleeves, pants, a wide-brimmed hat, and sunglasses year round, whenever you are outdoors.  Notify your caregiver of new moles or changes in moles, especially if there is a change in shape or color. Also notify your caregiver if a mole is larger than the size of a pencil eraser.  Stay current with your immunizations. Document Released: 05/31/2011 Document Revised: 03/12/2013 Document Reviewed: 05/31/2011 [facility] Patient Information 2014 [employer].

## 2014-07-16 NOTE — Progress Notes (Signed)
Bailey [name] 11/22/1933 [id number]        80 y.o.  [id number] for followup exam. Several issues noted below.  Past medical history,surgical history, problem list, medications, allergies, family history and social history were all reviewed and documented as reviewed in the EPIC chart.  ROS:  12 system ROS performed with pertinent positives and negatives included in the history, assessment and plan.   Additional significant findings :  None   Exam: Kim Counsellor Vitals:   07/16/14 0924  BP: 124/78  Height: 5\' 2"  (1.575 m)  Weight: 115 lb (52.164 kg)   General appearance:  Normal affect, orientation and appearance. Skin: Grossly normal HEENT: Without gross lesions.  No cervical or supraclavicular adenopathy. Thyroid normal.  Lungs:  Clear without wheezing, rales or rhonchi Cardiac: RR, without RMG Abdominal:  Soft, nontender, without masses, guarding, rebound, organomegaly or hernia Breasts:  Examined lying and sitting without masses, retractions, discharge or axillary adenopathy. Pelvic:  Ext/BUS/vagina with generalized atrophic changes. Mild rectocele noted.  Cervix atrophic, flush with the upper vagina  Uterus anteverted, normal size, shape and contour, midline and mobile nontender   Adnexa  Without masses or tenderness    Anus and perineum  Normal   Rectovaginal  Normal sphincter tone without palpated masses or tenderness.    Assessment/Plan:  80 y.o. [id number] female for followup exam.   1. Postmenopausal/atrophic genital changes/HRT. Patient continues on Prempro 0.45/1.5. Doing well and wants to continue.  I again reviewed the whole issue of HRT with her to include the WHI study with increased risk of stroke, heart attack, DVT and breast cancer. The ACOG and NAMS statements for lowest dose for the shortest period of time reviewed. Transdermal versus oral first-pass effect benefit discussed.  Unusual nature to continue into the 70s with increased risk of thrombosis realistically  reviewed. Patient again adamantly wants to continue stating she feels great on the medication had tried stopping previously and did not feel well and wants to continue. She has a clear understanding of the situation and risks and I refilled her x1 year. No vaginal bleeding. Patient knows to report any vaginal bleeding. 2. Rectocele. Patient has small rectocele on exam. Asymptomatic to the patient. Continue to monitor. 3. Osteopenia. DEXA 06/2014 T score -1.7 stable from prior DEXA. Repeat at 2 year interval. Increased calcium vitamin D reviewed. 4. Pap smear 2012. No Pap smear done today. No history of significant abnormal Pap smears. Reviewed current screening guidelines we both agreed to stop screening she is over the age of 79. 31. Mammography 06/2014. Continue with annual mammography. SBE monthly reviewed. 6. Colonoscopy 2013. Repeat at their recommended interval. 7. Health maintenance. No routine blood work done and she reports this done through her primary physician's office. Followup one year, sooner as needed.   Note: This document was prepared with digital dictation and possible smart phrase technology. Any transcriptional errors that result from this process are unintentional.   [name] MD, 10:03 AM 07/16/2014

## 2014-07-17 LAB — URINALYSIS W MICROSCOPIC + REFLEX CULTURE
Bilirubin Urine: NEGATIVE
CRYSTALS: NONE SEEN
Casts: NONE SEEN
Glucose, UA: NEGATIVE mg/dL
Hgb urine dipstick: NEGATIVE
KETONES UR: NEGATIVE mg/dL
Leukocytes, UA: NEGATIVE
Nitrite: NEGATIVE
PROTEIN: NEGATIVE mg/dL
SPECIFIC GRAVITY, URINE: 1.019 (ref 1.005–1.030)
UROBILINOGEN UA: 0.2 mg/dL (ref 0.0–1.0)
pH: 5.5 (ref 5.0–8.0)

## 2014-07-18 LAB — URINE CULTURE
Colony Count: NO GROWTH
Organism ID, Bacteria: NO GROWTH

## 2014-07-24 ENCOUNTER — Other Ambulatory Visit: Payer: Self-pay

## 2014-07-24 MED ORDER — CARVEDILOL 3.125 MG PO TABS: ORAL_TABLET | ORAL | Status: DC

## 2014-09-03 ENCOUNTER — Ambulatory Visit: Payer: Medicare Other

## 2014-09-04 ENCOUNTER — Ambulatory Visit (INDEPENDENT_AMBULATORY_CARE_PROVIDER_SITE_OTHER): Payer: Medicare Other | Admitting: Cardiovascular Disease

## 2014-09-04 ENCOUNTER — Encounter: Payer: Self-pay | Admitting: Cardiovascular Disease

## 2014-09-04 VITALS — BP 126/60 | HR 70 | Ht 62.0 in | Wt 113.0 lb

## 2014-09-04 DIAGNOSIS — I493 Ventricular premature depolarization: Secondary | ICD-10-CM

## 2014-09-04 DIAGNOSIS — E7800 Pure hypercholesterolemia, unspecified: Secondary | ICD-10-CM

## 2014-09-04 DIAGNOSIS — I480 Paroxysmal atrial fibrillation: Secondary | ICD-10-CM | POA: Diagnosis not present

## 2014-09-04 NOTE — Patient Instructions (Signed)
Your physician wants you to follow-up in:  6 MONTHS WITH DR NISHAN  You will receive a reminder letter in the mail two months in advance. If you don't receive a letter, please call our office to schedule the follow-up appointment. Your physician recommends that you continue on your current medications as directed. Please refer to the Current Medication list given to you today. 

## 2014-09-04 NOTE — Progress Notes (Signed)
Patient ID: Bailey [name], female   DOB: [birth year], 80 y.o.   MRN: [id number] Bailey [name] is seen today in followup for palpitations and PVCs. She saw Dr. Rayann Heman in February 2011 and he agreed to continue beta blocker therapy. she's been doing better. She was contemplating not taking her p.m. dose according. She had a trip to the beach which was exciting. Her palpitations have been well under control and she has not had any significant chest pain or syncope. SHe is happy to continue with her beta blocker. Her tests at the end of 2009 showed no evidence of structural heart disease. She continues to see Dr. Gershon Crane for her eye care. She does have a history of mitral valve prolapse and mild aortic insufficiency. She had some dyspnea at the beach was was unsettling along with some LE edema. No excess salt. Edema has happened in past due to standing and ankle varicosities  Last echo 2012 only trivial MR and mild AR  Had [facility] family reunion at [facility] Occasional palpitations Asked if it was ok to have a glass of brandy in late afternoon and I said yes    ROS: Denies fever, malais, weight loss, blurry vision, decreased visual acuity, cough, sputum, SOB, hemoptysis, pleuritic pain, palpitaitons, heartburn, abdominal pain, melena, lower extremity edema, claudication, or rash.  All other systems reviewed and negative  General: Affect appropriate Healthy:  appears stated age 80: normal Neck supple with no adenopathy JVP normal no bruits no thyromegaly Lungs clear with no wheezing and good diaphragmatic motion Heart:  S1/S2 no murmur, no rub, gallop or click PMI normal Abdomen: benighn, BS positve, no tenderness, no AAA no bruit.  No HSM or HJR Distal pulses intact with no bruits No edema Neuro non-focal Skin warm and dry No muscular weakness   Current Outpatient Prescriptions  Medication Sig Dispense Refill  . acyclovir (ZOVIRAX) 800 MG tablet Take as directed when pt has an outbreak       .  aspirin 81 MG tablet Take 81 mg by mouth daily.      . carvedilol (COREG) 3.125 MG tablet TAKE 1 TABLET BY MOUTH once A DAY WITH A MEAL  30 tablet  6  . Cholecalciferol (VITAMIN D) 400 UNITS capsule Take 2 capsules by mouth daily       . cycloSPORINE (RESTASIS) 0.05 % ophthalmic emulsion 1 drop 2 (two) times daily.        Marland Kitchen estrogen, conjugated,-medroxyprogesterone (PREMPRO) 0.45-1.5 MG per tablet TAKE 1 TABLET BY MOUTH DAILY.  30 tablet  11  . fish oil-omega-3 fatty acids 1000 MG capsule Take one capsule by mouth  daily      . fluocinonide cream (LIDEX) 0.05 % USE AS DIRECTED      . fluticasone (FLONASE) 50 MCG/ACT nasal spray Place 2 sprays into both nostrils 2 (two) times daily.  16 g  6  . hydrocortisone ([employer] CORTISONE MAXIMUM STRENGTH) 1 % ointment Apply 1 application topically 2 (two) times daily.      . naproxen sodium (ANAPROX) 220 MG tablet Take 220 mg by mouth daily.        No current facility-administered medications for this visit.    Allergies  Diamox; Epinephrine-lidocaine-na metabisulfite; Erythromycin; Penicillins; and Sulfonamide derivatives  Electrocardiogram:  SR rate 70  PVC otherwise normal   Assessment and Plan

## 2014-09-04 NOTE — Assessment & Plan Note (Signed)
Benign normal EF and no CAD  Continue beta blocker

## 2014-09-04 NOTE — Assessment & Plan Note (Signed)
Maint NSR continue asa and low dose beta blocker

## 2014-09-04 NOTE — Assessment & Plan Note (Signed)
Cholesterol is at goal.  Continue current dose of statin and diet Rx.  No myalgias or side effects.  F/U  LFT's in 6 months. Lab Results  Component Value Date   LDLCALC 65 11/08/2013

## 2014-09-30 ENCOUNTER — Encounter: Payer: Self-pay | Admitting: Cardiovascular Disease

## 2014-10-03 DIAGNOSIS — M67922 Unspecified disorder of synovium and tendon, left upper arm: Secondary | ICD-10-CM | POA: Diagnosis not present

## 2014-10-03 DIAGNOSIS — M8580 Other specified disorders of bone density and structure, unspecified site: Secondary | ICD-10-CM | POA: Diagnosis not present

## 2014-10-03 DIAGNOSIS — I7300 Raynaud's syndrome without gangrene: Secondary | ICD-10-CM | POA: Diagnosis not present

## 2014-10-03 DIAGNOSIS — M3500 Sicca syndrome, unspecified: Secondary | ICD-10-CM | POA: Diagnosis not present

## 2014-10-04 ENCOUNTER — Ambulatory Visit (INDEPENDENT_AMBULATORY_CARE_PROVIDER_SITE_OTHER): Payer: Self-pay

## 2014-10-04 DIAGNOSIS — Z23 Encounter for immunization: Secondary | ICD-10-CM | POA: Diagnosis not present

## 2014-10-09 ENCOUNTER — Ambulatory Visit (INDEPENDENT_AMBULATORY_CARE_PROVIDER_SITE_OTHER): Payer: Medicare Other

## 2014-10-09 DIAGNOSIS — Z23 Encounter for immunization: Secondary | ICD-10-CM

## 2014-10-21 ENCOUNTER — Encounter: Payer: Self-pay | Admitting: Family Medicine

## 2014-10-21 ENCOUNTER — Ambulatory Visit (INDEPENDENT_AMBULATORY_CARE_PROVIDER_SITE_OTHER): Payer: Medicare Other | Admitting: Family Medicine

## 2014-10-21 DIAGNOSIS — I480 Paroxysmal atrial fibrillation: Secondary | ICD-10-CM | POA: Diagnosis not present

## 2014-10-21 DIAGNOSIS — Z23 Encounter for immunization: Secondary | ICD-10-CM | POA: Diagnosis not present

## 2014-10-21 DIAGNOSIS — I7300 Raynaud's syndrome without gangrene: Secondary | ICD-10-CM | POA: Insufficient documentation

## 2014-10-21 DIAGNOSIS — B001 Herpesviral vesicular dermatitis: Secondary | ICD-10-CM | POA: Insufficient documentation

## 2014-10-21 NOTE — Addendum Note (Signed)
Addended by: Marin Olp on: 10/21/2014 06:41 PM   Modules accepted: Level of Service

## 2014-10-21 NOTE — Patient Instructions (Signed)
Great to meet you!   Check in 6 months from now but overall you are doing very well.   No changes to medications today. Since you just had bloodwork, i will not repeat at this time but could consider yearly.

## 2014-10-21 NOTE — Assessment & Plan Note (Signed)
In NSR at present. Continue aspirin (chads2 of 1 for age) alone as well as beta blocker for times she may go into a fib as well as for PVC/palpitation history.

## 2014-10-21 NOTE — Progress Notes (Signed)
Bailey Reddish, MD Phone: [phone]  Subjective:  Patient presents today to establish care with me as their new primary care provider. Patient was formerly a patient of Dr. Lenna Gilford. Chief complaint-noted.   Paroxysmal Atrial fibrillation Rarely feeling palpitations on beta blocker. Patient with history pvc/palpitations along with a. Fib. Due to PVCs being dominant rhythm in the past, patient placed on aspirin alone.  ROS- no chest pain or shortness of breath.   Other updates- Sees Dr. Estanislado Pandy or PA every 6 months for arthritis (? rhematoid) The following were reviewed and entered/updated in epic: Past Medical History  Diagnosis Date  . Sicca syndrome   . Anemia, unspecified   . History of atrial fibrillation   . Premature ventricular contractions   . Dry eye syndrome   . Allergy, unspecified not elsewhere classified   . Palpitations   . Mitral valve disorders   . Esophageal reflux   . Diverticulosis of colon (without mention of hemorrhage)   . Irritable bowel syndrome   . Benign neoplasm of colon   . Rheumatoid arthritis(714.0)   . Fibromyalgia   . Osteopenia 06/2014    T score -1.7 FRAX not calculated due to prior HRT. No significant change from prior DEXA  . Allergy     seasonal  . Cancer     basal/sqamous/transitional melanoma  . Ulcer 1992  . COLONIC POLYPS 09/04/2008    Was told by Gi does not need anymore.     Patient Active Problem List   Diagnosis Date Noted  . PAROXYSMAL ATRIAL FIBRILLATION 09/05/2008    Priority: High  . Raynauds syndrome 10/21/2014    Priority: Medium  . [place] SYNDROME 08/20/2010    Priority: Medium  . Rheumatoid arthritis 08/21/2009    Priority: Medium  . PVC (premature ventricular contraction) 06/04/2009    Priority: Medium  . Mitral valve prolapse 12/25/2008    Priority: Medium  . 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 Edema 06/29/2011    Priority: Low  . Osteopenia 09/05/2008    Priority: Low  . GERD  09/04/2008    Priority: Low  . Irritable bowel syndrome 09/04/2008    Priority: Low  . ALLERGY 09/04/2008    Priority: Low   Past Surgical History  Procedure Laterality Date  . Cholecystectomy    . Appendectomy    . Hiatal hernia repair    . Tubal ligation    . Colon tumor  1969    Benign rectal tumor excised  . Cataract extraction    . Refractive surgery    . Rotator cuff repair  2009    right by Dr. French Ana  . Squamous cell removed  2013    Dr. Sherrye Payor --removed from her right cheek    Family History  Problem Relation Age of Onset  . Stroke Mother   . Hypertension Mother   . Ulcerative colitis Father   . Heart attack Father   . Breast cancer Maternal Grandmother     Age 76's  . Uterine cancer Maternal Grandmother   . Hypertension Maternal Grandfather   . Colon cancer Paternal Grandmother     Medications- reviewed and updated Current Outpatient Prescriptions  Medication Sig Dispense Refill  . aspirin 81 MG tablet Take 81 mg by mouth daily.    . carvedilol (COREG) 3.125 MG tablet TAKE 1 TABLET BY MOUTH once A DAY WITH A MEAL 30 tablet 6  . Cholecalciferol (VITAMIN D) 400 UNITS capsule Take 2 capsules by mouth  daily     . cycloSPORINE (RESTASIS) 0.05 % ophthalmic emulsion 1 drop 2 (two) times daily.      Marland Kitchen estrogen, conjugated,-medroxyprogesterone (PREMPRO) 0.45-1.5 MG per tablet TAKE 1 TABLET BY MOUTH DAILY. 30 tablet 11  . fish oil-omega-3 fatty acids 1000 MG capsule Take one capsule by mouth  daily    . acyclovir (ZOVIRAX) 800 MG tablet Take as directed when pt has an outbreak     . fluocinonide cream (LIDEX) 0.05 % USE AS DIRECTED    . fluticasone (FLONASE) 50 MCG/ACT nasal spray Place 2 sprays into both nostrils 2 (two) times daily. (Patient not taking: Reported on 10/21/2014) 16 g 6  . hydrocortisone ([employer]S CORTISONE MAXIMUM STRENGTH) 1 % ointment Apply 1 application topically 2 (two) times daily.    . naproxen sodium (ANAPROX) 220 MG tablet Take 220 mg by mouth  daily.      No current facility-administered medications for this visit.    Allergies-reviewed and updated Allergies  Allergen Reactions  . Diamox [Acetazolamide] Nausea Only  . Epinephrine-Lidocaine-Na Metabisulfite [Lidocaine-Epinephrine] Other (See Comments)    Atrial fibrillation  . Erythromycin     REACTION: severe nausea  . Penicillins     REACTION: unsure of reaction  . Sulfonamide Derivatives     REACTION: nausea-elevated fever--kidney problems    History   Social History  . Marital Status: Married    Spouse Name: Doren Custard    Number of Children: 4  . Years of Education: N/A   Social History Main Topics  . Smoking status: Former Smoker -- 2.00 packs/day for 9 years    Types: Cigarettes    Quit date: 11/30/1963  . Smokeless tobacco: Never Used  . Alcohol Use: 2.3 oz/week    3 Glasses of wine, 1 Not specified per week     Comment: has 3-4 per week  . Drug Use: No  . Sexual Activity: Yes    Birth Control/ Protection: Post-menopausal   Other Topics Concern  . None   Social History Narrative   Married 1958. 4 children. 10 grandkids (1 trying to go to medical school). No greatgrandkids.       Retired from nursing (medical and surgical, most recently in cardiology)   Degree in history and english after finished nursing- 2 mo before 72th birthday      Hobbies: education, travel, reading, piano     ROS--See HPI   Objective: BP 120/74 mmHg  Pulse 67  Temp(Src) 97.9 F (36.6 C)  Wt 118 lb (53.524 kg) Gen: NAD, resting comfortably in chair, appears younger than stated age CV: RRR no murmurs rubs or gallops (1 ectopic beat over 30 seconds noted) Lungs: CTAB no crackles, wheeze, rhonchi Abdomen: soft/nontender/nondistended/normal bowel sounds.  Ext: no edema with stockings on.  Skin: warm, dry, no rash  Neuro: grossly normal, moves all extremities, PERRLA   Assessment/Plan:  PAROXYSMAL ATRIAL FIBRILLATION In NSR at present. Continue aspirin (chads2 of 1  for age) alone as well as beta blocker for times she may go into a fib as well as for PVC/palpitation history.    Orders Placed This Encounter  Procedures  . Pneumococcal conjugate vaccine 13-valent

## 2014-10-31 DIAGNOSIS — L570 Actinic keratosis: Secondary | ICD-10-CM | POA: Diagnosis not present

## 2014-10-31 DIAGNOSIS — D2272 Melanocytic nevi of left lower limb, including hip: Secondary | ICD-10-CM | POA: Diagnosis not present

## 2014-10-31 DIAGNOSIS — L603 Nail dystrophy: Secondary | ICD-10-CM | POA: Diagnosis not present

## 2014-10-31 DIAGNOSIS — I8312 Varicose veins of left lower extremity with inflammation: Secondary | ICD-10-CM | POA: Diagnosis not present

## 2014-10-31 DIAGNOSIS — L821 Other seborrheic keratosis: Secondary | ICD-10-CM | POA: Diagnosis not present

## 2014-10-31 DIAGNOSIS — D1801 Hemangioma of skin and subcutaneous tissue: Secondary | ICD-10-CM | POA: Diagnosis not present

## 2014-10-31 DIAGNOSIS — L814 Other melanin hyperpigmentation: Secondary | ICD-10-CM | POA: Diagnosis not present

## 2014-10-31 DIAGNOSIS — Z85828 Personal history of other malignant neoplasm of skin: Secondary | ICD-10-CM | POA: Diagnosis not present

## 2014-10-31 DIAGNOSIS — D2271 Melanocytic nevi of right lower limb, including hip: Secondary | ICD-10-CM | POA: Diagnosis not present

## 2014-11-12 ENCOUNTER — Ambulatory Visit: Payer: Medicare Other | Admitting: Pulmonary Disease

## 2014-11-21 ENCOUNTER — Encounter: Payer: Self-pay | Admitting: Pulmonary Disease

## 2014-11-28 ENCOUNTER — Encounter: Payer: Self-pay | Admitting: Gynecology

## 2014-11-28 ENCOUNTER — Ambulatory Visit (INDEPENDENT_AMBULATORY_CARE_PROVIDER_SITE_OTHER): Payer: Medicare Other | Admitting: Gynecology

## 2014-11-28 VITALS — BP 110/80 | Ht 62.0 in | Wt 118.0 lb

## 2014-11-28 DIAGNOSIS — R350 Frequency of micturition: Secondary | ICD-10-CM | POA: Diagnosis not present

## 2014-11-28 DIAGNOSIS — N950 Postmenopausal bleeding: Secondary | ICD-10-CM | POA: Diagnosis not present

## 2014-11-28 DIAGNOSIS — R8290 Unspecified abnormal findings in urine: Secondary | ICD-10-CM | POA: Diagnosis not present

## 2014-11-28 NOTE — Progress Notes (Addendum)
EUTHA CUDE 12-16-1932 [id number]        81 y.o.  [id number] presents complaining of several days of dark brown vaginal staining. Also some mild lower pelvic cramping. Also notes some mild urinary frequency. No dysuria or urgency fever chills nausea vomiting diarrhea constipation. No overt precipitating events. No history of same before. Is on HRT of Prempro 0.45/1.5 daily.  Past medical history,surgical history, problem list, medications, allergies, family history and social history were all reviewed and documented in the EPIC chart.  Directed ROS with pertinent positives and negatives documented in the history of present illness/assessment and plan.  Exam: Wandra Scot assistant General appearance:  Normal Abdomen soft nontender without masses guarding rebound. Pelvic external BUS vagina with atrophic changes. Dark brown up her vaginal blood noted. Cervix grossly normal with atrophic changes with blood at the os. Bimanual uterus normal size midline mobile nontender. Adnexa without masses or tenderness.  Assessment/Plan:  80 y.o. [id number] postmenopausal patient with vaginal bleeding. Recommended sonohysterogram to rule out endometrial abnormalities and allow for endometrial sampling. Patient will schedule and follow up for this.  Patient is having some slight frequency although no other signs or symptoms of a UTI. Will check baseline urinalysis.     [name] MD, 12:12 PM 11/28/2014

## 2014-11-28 NOTE — Patient Instructions (Signed)
Follow up for ultrasound as scheduled 

## 2014-11-29 LAB — URINALYSIS W MICROSCOPIC + REFLEX CULTURE
Casts: NONE SEEN
Glucose, UA: NEGATIVE mg/dL
NITRITE: NEGATIVE
Protein, ur: NEGATIVE mg/dL
Specific Gravity, Urine: 1.027 (ref 1.005–1.030)
Urobilinogen, UA: 0.2 mg/dL (ref 0.0–1.0)
pH: 5 (ref 5.0–8.0)

## 2014-11-30 LAB — URINE CULTURE
Colony Count: NO GROWTH
ORGANISM ID, BACTERIA: NO GROWTH

## 2014-12-02 ENCOUNTER — Other Ambulatory Visit: Payer: Self-pay | Admitting: Gynecology

## 2014-12-02 DIAGNOSIS — R3129 Other microscopic hematuria: Secondary | ICD-10-CM

## 2014-12-02 DIAGNOSIS — N950 Postmenopausal bleeding: Secondary | ICD-10-CM

## 2014-12-18 ENCOUNTER — Ambulatory Visit (INDEPENDENT_AMBULATORY_CARE_PROVIDER_SITE_OTHER): Payer: Medicare Other

## 2014-12-18 ENCOUNTER — Encounter: Payer: Self-pay | Admitting: Gynecology

## 2014-12-18 ENCOUNTER — Other Ambulatory Visit: Payer: Self-pay | Admitting: Gynecology

## 2014-12-18 ENCOUNTER — Ambulatory Visit (INDEPENDENT_AMBULATORY_CARE_PROVIDER_SITE_OTHER): Payer: Medicare Other | Admitting: Gynecology

## 2014-12-18 DIAGNOSIS — N840 Polyp of corpus uteri: Secondary | ICD-10-CM

## 2014-12-18 DIAGNOSIS — R312 Other microscopic hematuria: Secondary | ICD-10-CM | POA: Diagnosis not present

## 2014-12-18 DIAGNOSIS — N858 Other specified noninflammatory disorders of uterus: Secondary | ICD-10-CM | POA: Diagnosis not present

## 2014-12-18 DIAGNOSIS — R3129 Other microscopic hematuria: Secondary | ICD-10-CM

## 2014-12-18 DIAGNOSIS — R938 Abnormal findings on diagnostic imaging of other specified body structures: Secondary | ICD-10-CM | POA: Diagnosis not present

## 2014-12-18 DIAGNOSIS — R9389 Abnormal findings on diagnostic imaging of other specified body structures: Secondary | ICD-10-CM

## 2014-12-18 DIAGNOSIS — N950 Postmenopausal bleeding: Secondary | ICD-10-CM

## 2014-12-18 NOTE — Patient Instructions (Signed)
Office will call you with biopsy results and to arrange surgery.

## 2014-12-18 NOTE — Progress Notes (Signed)
Bailey [name] 07/09/1933 [id number]        81 y.o.  [id number] Presents for ultrasound/sonohysterogram due to episode of postmenopausal bleeding. She has continued to bleed on and off since her initial episode and of December. No significant discomfort or pain.  Past medical history,surgical history, problem list, medications, allergies, family history and social history were all reviewed and documented in the EPIC chart.  Directed ROS with pertinent positives and negatives documented in the history of present illness/assessment and plan.  Ultrasound shows uterus normal size and echotexture. Endometrial echo 6.7 mm. Right ovary atrophic. Left ovary not visualized. Posterior uterine wall subserosal solid appearing area consistent with a sub-serosal leiomyoma  Exam: [place] assistant General appearance:  Normal External BUS vagina normal with atrophic changes. Cervix grossly normal.  Sonohysterogram performed, sterile technique, single-toothed tenaculum interloop stabilization, slight cervical dilatation performed to admit the sonohysterogram catheter with subsequent good distention and a clear endometrial  Defect measuring 14 x 8 x 11 mm. Endometrial sample was taken. Patient tolerated well.  Assessment/Plan:  81 y.o. [id number] with post menopausal bleeding and sonohysterogram showing an endometrial polyp. Endometrial sample was taken. Patient will follow up for these results. I reviewed my recommendation to proceed with hysteroscopy D&C and resection of the endometrial polyp. I discussed the differential to include benign versus malignant. I reviewed the proposed intraoperative and postoperative courses as well as the recovery period. I discussed the risks of infection, prolonged antibiotics, hemorrhage necessitating transfusion and the risks of transfusion to include transfusion reaction, hepatitis, HIV, mad cow disease and other unknown entities. The risks of uterine perforation and damage to internal  organs including bowel, bladder, ureters, vessels, nerves requiring major exploratory reparative surgeries including bowel resection, ostomy formation, bladder and ureteral damage repair was all discussed with her. The risk of distended media absorption leading to metabolic complications such as fluid overload, coma and seizures was also discussed with her. The patient's questions were answered to her satisfaction and we will go ahead and schedule her for the surgery and I will see her again for the preoperative visit.     [name] MD, 1:59 PM 12/18/2014

## 2014-12-19 LAB — URINALYSIS W MICROSCOPIC + REFLEX CULTURE
Casts: NONE SEEN
GLUCOSE, UA: NEGATIVE mg/dL
Leukocytes, UA: NEGATIVE
Nitrite: NEGATIVE
PH: 5 (ref 5.0–8.0)
Protein, ur: NEGATIVE mg/dL
Specific Gravity, Urine: 1.028 (ref 1.005–1.030)
UROBILINOGEN UA: 0.2 mg/dL (ref 0.0–1.0)

## 2014-12-20 LAB — URINE CULTURE
Colony Count: NO GROWTH
Organism ID, Bacteria: NO GROWTH

## 2014-12-23 ENCOUNTER — Other Ambulatory Visit: Payer: Self-pay | Admitting: Gynecology

## 2014-12-23 DIAGNOSIS — R3129 Other microscopic hematuria: Secondary | ICD-10-CM

## 2014-12-25 ENCOUNTER — Other Ambulatory Visit: Payer: Self-pay | Admitting: Gynecology

## 2014-12-25 ENCOUNTER — Other Ambulatory Visit: Payer: Self-pay

## 2014-12-25 MED ORDER — MISOPROSTOL 200 MCG PO TABS: ORAL_TABLET | ORAL | Status: DC

## 2014-12-26 ENCOUNTER — Telehealth: Payer: Self-pay

## 2014-12-26 ENCOUNTER — Telehealth: Payer: Self-pay | Admitting: Family Medicine

## 2014-12-26 NOTE — Telephone Encounter (Signed)
I called patient and scheduled her Hyst D&C for 01/21/15 at 9:30am.  She will see Dr. Loetta Rough on 01/16/15 at 11:30am for pre op consult.  I informed patient that Dr. Loetta Rough wants her to get medical clearance from her PCP for her surgery.  We will need something in writing that I can send to anesthesia.  She said her PCP, Dr. Yong Channel, is part of [place] so he will be able to review her chart. She will call them as soon as possible to get that coordinated.  I advised her regarding Cytotec tab vaginally hs before surgery and that was sent to her pharmacy.

## 2014-12-26 NOTE — Telephone Encounter (Signed)
Pt is having a dnc and needs surgery clearance.  You have only seen pt one time. Scheduled pt for a 30 min on 2/11 (had a cancellation!)  Is that ok?

## 2014-12-26 NOTE — Telephone Encounter (Signed)
Kathy-sorry but since she has a cardiologist. I would prefer she see him for cardiac clearance. Can you cancel that appointment and inform patient?

## 2014-12-26 NOTE — Telephone Encounter (Signed)
Thank you!  That is why I like to ask!! Called pt,  pt aware to call cardiologist.

## 2014-12-27 ENCOUNTER — Telehealth: Payer: Self-pay | Admitting: Cardiovascular Disease

## 2014-12-27 NOTE — Telephone Encounter (Signed)
Ok to have d and c

## 2014-12-27 NOTE — Telephone Encounter (Signed)
PT  NEEDING TO HAVE DNC MAY  PT  PROCEED  WILL FORWARD TO DR Johnsie Cancel FOR  REVIEW .Adonis Housekeeper

## 2014-12-27 NOTE — Telephone Encounter (Signed)
New message      Request for surgical clearance:  What type of surgery is being performed? DNC 1. When is this surgery scheduled? 01-21-15  Are there any medications that need to be held prior to surgery and how long? no 2. Name of physician performing surgery?Dr Phineas Real  3. What is your office phone and fax number? [phone] phone ask for cathy  4. Please let pt know if she has been cleared

## 2014-12-30 NOTE — Telephone Encounter (Signed)
LM TO CALL BACK ./CY 

## 2014-12-30 NOTE — Telephone Encounter (Signed)
PT NOTIFIED  MAY PROCEED WITH D AND C./CY

## 2015-01-09 ENCOUNTER — Encounter (HOSPITAL_COMMUNITY)
Admission: RE | Admit: 2015-01-09 | Discharge: 2015-01-09 | Disposition: A | Discharge status: Home / Self Care | Payer: Medicare Other | Source: Ambulatory Visit | Attending: Gynecology | Admitting: Gynecology

## 2015-01-09 ENCOUNTER — Ambulatory Visit: Payer: Medicare Other | Admitting: Family Medicine

## 2015-01-09 ENCOUNTER — Encounter (HOSPITAL_COMMUNITY): Payer: Self-pay

## 2015-01-09 DIAGNOSIS — N840 Polyp of corpus uteri: Secondary | ICD-10-CM | POA: Diagnosis not present

## 2015-01-09 DIAGNOSIS — N950 Postmenopausal bleeding: Secondary | ICD-10-CM | POA: Insufficient documentation

## 2015-01-09 DIAGNOSIS — Z01818 Encounter for other preprocedural examination: Secondary | ICD-10-CM | POA: Diagnosis not present

## 2015-01-09 HISTORY — DX: Inflammatory liver disease, unspecified: K75.9

## 2015-01-09 HISTORY — DX: Cardiac arrhythmia, unspecified: I49.9

## 2015-01-09 LAB — CBC
HCT: 35.7 % — ABNORMAL LOW (ref 36.0–46.0)
Hemoglobin: 11.9 g/dL — ABNORMAL LOW (ref 12.0–15.0)
MCH: 31.2 pg (ref 26.0–34.0)
MCHC: 33.3 g/dL (ref 30.0–36.0)
MCV: 93.5 fL (ref 78.0–100.0)
Platelets: 289 10*3/uL (ref 150–400)
RBC: 3.82 MIL/uL — ABNORMAL LOW (ref 3.87–5.11)
RDW: 12.8 % (ref 11.5–15.5)
WBC: 10.5 10*3/uL (ref 4.0–10.5)

## 2015-01-09 LAB — COMPREHENSIVE METABOLIC PANEL
ALT: 13 U/L (ref 0–35)
ANION GAP: 5 (ref 5–15)
AST: 22 U/L (ref 0–37)
Albumin: 3.7 g/dL (ref 3.5–5.2)
Alkaline Phosphatase: 48 U/L (ref 39–117)
BILIRUBIN TOTAL: 0.9 mg/dL (ref 0.3–1.2)
BUN: 18 mg/dL (ref 6–23)
CHLORIDE: 105 mmol/L (ref 96–112)
CO2: 28 mmol/L (ref 19–32)
Calcium: 9 mg/dL (ref 8.4–10.5)
Creatinine, Ser: 0.88 mg/dL (ref 0.50–1.10)
GFR calc Af Amer: 71 mL/min — ABNORMAL LOW (ref >90)
GFR calc non Af Amer: 61 mL/min — ABNORMAL LOW (ref >90)
Glucose, Bld: 138 mg/dL — ABNORMAL HIGH (ref 70–99)
POTASSIUM: 4.4 mmol/L (ref 3.5–5.1)
SODIUM: 138 mmol/L (ref 135–145)
Total Protein: 6.9 g/dL (ref 6.0–8.3)

## 2015-01-09 NOTE — Patient Instructions (Signed)
Your procedure is scheduled on:01/21/15  Enter through the Main Entrance at :8am Pick up desk phone and dial [phone] and inform us of your arrival.  Please call [phone] if you have any problems the morning of surgery.  Remember: Do not eat food or drink liquids, including water, after midnight on Monday 01/20/15  You may brush your teeth the morning of surgery.  Take these meds the morning of surgery with a sip of water: Carvedilol  DO NOT wear jewelry, eye make-up, lipstick,body lotion, or dark fingernail polish.  (Polished toes are ok) You may wear deodorant.  If you are to be admitted after surgery, leave suitcase in car until your room has been assigned. Patients discharged on the day of surgery will not be allowed to drive home. Wear loose fitting, comfortable clothes for your ride home.

## 2015-01-15 ENCOUNTER — Ambulatory Visit (INDEPENDENT_AMBULATORY_CARE_PROVIDER_SITE_OTHER): Payer: Medicare Other | Admitting: Family Medicine

## 2015-01-15 ENCOUNTER — Encounter: Payer: Self-pay | Admitting: Family Medicine

## 2015-01-15 VITALS — BP 102/64 | Temp 98.1°F | Wt 116.0 lb

## 2015-01-15 DIAGNOSIS — J329 Chronic sinusitis, unspecified: Secondary | ICD-10-CM | POA: Diagnosis not present

## 2015-01-15 DIAGNOSIS — A499 Bacterial infection, unspecified: Secondary | ICD-10-CM

## 2015-01-15 DIAGNOSIS — B9689 Other specified bacterial agents as the cause of diseases classified elsewhere: Secondary | ICD-10-CM

## 2015-01-15 MED ORDER — AZITHROMYCIN 250 MG PO TABS: ORAL_TABLET | ORAL | Status: DC

## 2015-01-15 NOTE — Patient Instructions (Signed)
Bacterial sinusitis   Based off of improvement and then worsening  Treat with zpak x 5 days  Follow up if symptoms persist after antibiotics or if worsen  Hope surgery goes well

## 2015-01-15 NOTE — Progress Notes (Signed)
Garret Reddish, MD Phone: [phone]  Subjective:   Bailey [name] is a 81 y.o. year old very pleasant female patient who presents with the following:  Coughing/nasal congestion/sinus pressure -Wed started with sore throat, by Friday started with sneezing, chills, congestion. Started to lose her voice and seems to come and go. Occasional headaches. Has been very busy and had to push her through this time. Slow worsening. Sunday started getting more rest and getting more fluids. Yesterday coughed up some thick yellow mucus. Having surgery next Tuesday with d+C due to postmenopausal bleeding. Yesterday morning felt significantly better and then symptoms worsened yesterday evening and through today. Some maxillary sinus vague discomfort.   ROS- no fever. Felt some chills. No chest pain, shortness of breath. Much more fatigued.   Past Medical History- Patient Active Problem List   Diagnosis Date Noted  . PAROXYSMAL ATRIAL FIBRILLATION 09/05/2008    Priority: High  . Raynauds syndrome 10/21/2014    Priority: Medium  . 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 Rheumatoid arthritis 08/21/2009    Priority: Medium  . PVC (premature ventricular contraction) 06/04/2009    Priority: Medium  . Mitral valve prolapse 12/25/2008    Priority: Medium  . 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 Edema 06/29/2011    Priority: Low  . Osteopenia 09/05/2008    Priority: Low  . GERD 09/04/2008    Priority: Low  . Irritable bowel syndrome 09/04/2008    Priority: Low  . ALLERGY 09/04/2008    Priority: Low   Medications- reviewed and updated Current Outpatient Prescriptions  Medication Sig Dispense Refill  . aspirin 81 MG tablet Take 81 mg by mouth daily.    . carvedilol (COREG) 3.125 MG tablet TAKE 1 TABLET BY MOUTH once A DAY WITH A MEAL 30 tablet 6  . Cholecalciferol (VITAMIN D) 400 UNITS capsule Take 2 capsules by mouth daily     . cycloSPORINE (RESTASIS) 0.05 %  ophthalmic emulsion Place 1 drop into both eyes 2 (two) times daily.     Marland Kitchen estrogen, conjugated,-medroxyprogesterone (PREMPRO) 0.45-1.5 MG per tablet TAKE 1 TABLET BY MOUTH DAILY. 30 tablet 11  . estrogen, conjugated,-medroxyprogesterone (PREMPRO) 0.45-1.5 MG per tablet Take 1 tablet by mouth daily.    . fish oil-omega-3 fatty acids 1000 MG capsule Take one capsule by mouth  daily    . naproxen sodium (ANAPROX) 220 MG tablet Take 220 mg by mouth daily.     Marland Kitchen acyclovir (ZOVIRAX) 800 MG tablet Take 800 mg by mouth daily. Take once daily for 5 days prn for outbreak.    . fluticasone (FLONASE) 50 MCG/ACT nasal spray Place 2 sprays into both nostrils 2 (two) times daily. (Patient not taking: Reported on 12/31/2014) 16 g 6  . hydrocortisone ([employer] CORTISONE MAXIMUM STRENGTH) 1 % ointment Apply 1 application topically 2 (two) times daily.    . misoprostol (CYTOTEC) 200 MCG tablet Insert tablet VAGINALLY hs before surgery. (Patient not taking: Reported on 01/15/2015) 1 tablet 0   No current facility-administered medications for this visit.    Objective: BP 102/64 mmHg  Temp(Src) 98.1 F (36.7 C)  Wt 116 lb (52.617 kg) Gen: NAD, resting comfortably in chair NCAT, nares erythematous and swollen, maxillary sinus tenderness, TM normal bilaterally, oropharynx normal CV: RRR no murmurs rubs or gallops Lungs: CTAB no crackles, wheeze, rhonchi Ext: no edema Skin: warm, dry, no rash   Assessment/Plan:  Bacterial Sinusitis 8 days of symptoms which were improving  then worsened again yesterday. Likely bacterial superinfection. Also with upcoming surgery last week. We discussed potential risk of antibiotics including diarrhea or antibiotic resistance and arrythmia. Ultimately we opted to treat with zpak and return if no improvement. PCN allergy (unknown) and did not opt for augmentin but discussed higher resistance for zpak. Has erythromycin allergy but has tolerated zpak in the past.   Meds ordered this  encounter  Medications  . azithromycin (ZITHROMAX) 250 MG tablet    Sig: Take 2 tabs on day 1, then 1 tab daily until finished    Dispense:  6 tablet    Refill:  0

## 2015-01-16 ENCOUNTER — Ambulatory Visit (INDEPENDENT_AMBULATORY_CARE_PROVIDER_SITE_OTHER): Payer: Medicare Other | Admitting: Gynecology

## 2015-01-16 ENCOUNTER — Encounter: Payer: Self-pay | Admitting: Gynecology

## 2015-01-16 VITALS — BP 116/74

## 2015-01-16 DIAGNOSIS — N950 Postmenopausal bleeding: Secondary | ICD-10-CM

## 2015-01-16 DIAGNOSIS — N840 Polyp of corpus uteri: Secondary | ICD-10-CM | POA: Diagnosis not present

## 2015-01-16 NOTE — H&P (Signed)
  Bailey [name] 10-May-1933 [id number]   History and Physical  Chief complaint: postmenopausal bleeding, endometrial polyp  History of present illness: 81 y.o. [id number] with history of postmenopausal bleeding starting in December. Had been on HRT to consist of Prempro 0.45/1.5. Has blood on and off since the initial onset.  Sonohysterogram performed which shows an endometrial defect 14 x 8 x 11 mm consistent with an endometrial polyp. Initial biopsy showed atrophic endometrium. Patient is admitted for hysteroscopy D&C resection of any endometrial defects.  Past medical history,surgical history, medications, allergies, family history and social history were all reviewed and documented in the EPIC chart.  ROS:  Was performed and pertinent positives and negatives are included in the history of present illness.  Exam:  Kim assistant 01/16/2015 General: well developed, well nourished female, no acute distress HEENT: normal  Lungs: clear to auscultation without wheezing, rales or rhonchi  Cardiac: regular rate without rubs, murmurs or gallops  Abdomen: soft, nontender without masses, guarding, rebound, organomegaly  Pelvic: external bus vagina: normal with atrophic changes  Cervix: grossly normal with atrophic changes Uterus: normal size, midline and mobile, nontender  Adnexa: without masses or tenderness     Assessment/Plan:  81 y.o. [id number] with postmenopausal bleeding and sonohysterogram showing an endometrial polyp on HRT. Endometrial sample showed atrophic endometrium. Scheduled for hysteroscopy D&C, resection of endometrial polyp. I discussed the differential to include benign versus malignant. I reviewed the proposed surgery with her to include the intraoperative and postoperative courses as well as the recovery period. I discussed the risks of infection, prolonged antibiotics, hemorrhage necessitating transfusion and the risks of transfusion to include transfusion reaction, hepatitis, HIV, mad  cow disease and other unknown entities. The risks of uterine perforation and damage to internal organs including bowel, bladder, ureters, vessels, nerves requiring major exploratory reparative surgeries and future reparative surgeries including bowel resection, ostomy formation, bladder and ureteral damage repair was all discussed with her. The risk of distended media absorption leading to metabolic complications such as fluid overload, coma and seizures was also discussed with her. I instructed her to stop her aspirin regiment preoperatively and to hold on her HRT and we will rediscuss reinitiation postoperatively.  The patient's questions were answered to her satisfaction and she is ready to proceed with surgery.  She has received a preoperative clearance from her cardiologist.    [name] MD, 12:02 PM 01/16/2015

## 2015-01-16 NOTE — Patient Instructions (Addendum)
Follow up for surgery as scheduled Use the vaginal tablet the night before surgery.

## 2015-01-16 NOTE — Progress Notes (Signed)
Bailey [name] 1933-10-23 [id number]   Preoperative consult  Chief complaint: postmenopausal bleeding, endometrial polyp  History of present illness: 81 y.o. [id number] with history of postmenopausal bleeding starting in December. Had been on HRT to consist of Prempro 0.45/1.5. Has blood on and off since the initial onset.  Sonohysterogram performed which shows an endometrial defect 14 x 8 x 11 mm consistent with an endometrial polyp. Initial biopsy showed atrophic endometrium. Patient is admitted for hysteroscopy D&C resection of any endometrial defects.  Past medical history,surgical history, medications, allergies, family history and social history were all reviewed and documented in the EPIC chart.  ROS:  Was performed and pertinent positives and negatives are included in the history of present illness.  Exam:  Kim assistant 01/16/2015 General: well developed, well nourished female, no acute distress HEENT: normal  Lungs: clear to auscultation without wheezing, rales or rhonchi  Cardiac: regular rate without rubs, murmurs or gallops  Abdomen: soft, nontender without masses, guarding, rebound, organomegaly  Pelvic: external bus vagina: normal with atrophic changes  Cervix: grossly normal with atrophic changes Uterus: normal size, midline and mobile, nontender  Adnexa: without masses or tenderness     Assessment/Plan:  81 y.o. [id number] with postmenopausal bleeding and sonohysterogram showing an endometrial polyp on HRT. Endometrial sample showed atrophic endometrium. Scheduled for hysteroscopy D&C, resection of endometrial polyp. I discussed the differential to include benign versus malignant. I reviewed the proposed surgery with her to include the intraoperative and postoperative courses as well as the recovery period. I discussed the risks of infection, prolonged antibiotics, hemorrhage necessitating transfusion and the risks of transfusion to include transfusion reaction, hepatitis, HIV, mad  cow disease and other unknown entities. The risks of uterine perforation and damage to internal organs including bowel, bladder, ureters, vessels, nerves requiring major exploratory reparative surgeries and future reparative surgeries including bowel resection, ostomy formation, bladder and ureteral damage repair was all discussed with her. The risk of distended media absorption leading to metabolic complications such as fluid overload, coma and seizures was also discussed with her. I instructed her to stop her aspirin regiment preoperatively and to hold on her HRT and we will rediscuss reinitiation postoperatively.  The patient's questions were answered to her satisfaction and she is ready to proceed with surgery.  She has received a preoperative clearance from her cardiologist.    [name] MD, 11:54 AM 01/16/2015

## 2015-01-20 MED ORDER — CIPROFLOXACIN IN D5W 400 MG/200ML IV SOLN
400.0000 mg | INTRAVENOUS | Status: AC
  Administered 2015-01-21: 400 mg via INTRAVENOUS
  Filled 2015-01-20: qty 200

## 2015-01-20 MED ORDER — METRONIDAZOLE IN NACL 5-0.79 MG/ML-% IV SOLN
500.0000 mg | INTRAVENOUS | Status: AC
  Administered 2015-01-21: 500 mg via INTRAVENOUS
  Filled 2015-01-20: qty 100

## 2015-01-20 NOTE — Anesthesia Preprocedure Evaluation (Signed)
Anesthesia Evaluation  Patient identified by MRN, date of birth, ID band Patient awake    Reviewed: Allergy & Precautions, NPO status , Patient's Chart, lab work & pertinent test results  History of Anesthesia Complications Negative for: history of anesthetic complications  Airway Mallampati: II  TM Distance: >3 FB Neck ROM: Full    Dental no notable dental hx. (+) Dental Advisory Given   Pulmonary former smoker,  breath sounds clear to auscultation  Pulmonary exam normal       Cardiovascular + Peripheral Vascular Disease + dysrhythmias Atrial Fibrillation + Valvular Problems/Murmurs MVP and AI Rhythm:Regular Rate:Normal     Neuro/Psych negative neurological ROS  negative psych ROS   GI/Hepatic GERD-  Medicated and Controlled,(+) Hepatitis -  Endo/Other  negative endocrine ROS  Renal/GU negative Renal ROS  negative genitourinary   Musculoskeletal  (+) Arthritis -, Rheumatoid disorders,    Abdominal   Peds negative pediatric ROS (+)  Hematology  (+) anemia ,   Anesthesia Other Findings   Reproductive/Obstetrics negative OB ROS                             Anesthesia Physical Anesthesia Plan  ASA: III  Anesthesia Plan: General   Post-op Pain Management:    Induction: Intravenous  Airway Management Planned: LMA  Additional Equipment:   Intra-op Plan:   Post-operative Plan: Extubation in OR  Informed Consent: I have reviewed the patients History and Physical, chart, labs and discussed the procedure including the risks, benefits and alternatives for the proposed anesthesia with the patient or authorized representative who has indicated his/her understanding and acceptance.   Dental advisory given  Plan Discussed with: CRNA  Anesthesia Plan Comments:         Anesthesia Quick Evaluation

## 2015-01-21 ENCOUNTER — Encounter (HOSPITAL_COMMUNITY)
Admission: RE | Disposition: A | Discharge status: Home / Self Care | Payer: Self-pay | Source: Ambulatory Visit | Attending: Gynecology

## 2015-01-21 ENCOUNTER — Ambulatory Visit (HOSPITAL_COMMUNITY): Payer: Medicare Other | Admitting: Anesthesiology

## 2015-01-21 ENCOUNTER — Encounter (HOSPITAL_COMMUNITY): Payer: Self-pay | Admitting: *Deleted

## 2015-01-21 ENCOUNTER — Ambulatory Visit (HOSPITAL_COMMUNITY)
Admission: RE | Admit: 2015-01-21 | Discharge: 2015-01-21 | Disposition: A | Discharge status: Home / Self Care | Payer: Medicare Other | Source: Ambulatory Visit | Attending: Gynecology | Admitting: Gynecology

## 2015-01-21 DIAGNOSIS — I4891 Unspecified atrial fibrillation: Secondary | ICD-10-CM | POA: Insufficient documentation

## 2015-01-21 DIAGNOSIS — Z87891 Personal history of nicotine dependence: Secondary | ICD-10-CM | POA: Insufficient documentation

## 2015-01-21 DIAGNOSIS — K219 Gastro-esophageal reflux disease without esophagitis: Secondary | ICD-10-CM | POA: Insufficient documentation

## 2015-01-21 DIAGNOSIS — N840 Polyp of corpus uteri: Secondary | ICD-10-CM | POA: Diagnosis not present

## 2015-01-21 DIAGNOSIS — N950 Postmenopausal bleeding: Secondary | ICD-10-CM | POA: Diagnosis not present

## 2015-01-21 DIAGNOSIS — Z862 Personal history of diseases of the blood and blood-forming organs and certain disorders involving the immune mechanism: Secondary | ICD-10-CM | POA: Insufficient documentation

## 2015-01-21 DIAGNOSIS — I739 Peripheral vascular disease, unspecified: Secondary | ICD-10-CM | POA: Diagnosis not present

## 2015-01-21 DIAGNOSIS — M069 Rheumatoid arthritis, unspecified: Secondary | ICD-10-CM | POA: Diagnosis not present

## 2015-01-21 DIAGNOSIS — N711 Chronic inflammatory disease of uterus: Secondary | ICD-10-CM | POA: Diagnosis not present

## 2015-01-21 HISTORY — PX: DILATATION & CURETTAGE/HYSTEROSCOPY WITH MYOSURE: SHX6511

## 2015-01-21 SURGERY — DILATATION & CURETTAGE/HYSTEROSCOPY WITH MYOSURE
Anesthesia: General | Site: Uterus

## 2015-01-21 MED ORDER — FENTANYL CITRATE 0.05 MG/ML IJ SOLN: 25.0000 ug | INTRAMUSCULAR | Status: DC | PRN

## 2015-01-21 MED ORDER — SODIUM CHLORIDE 0.9 % IR SOLN
Status: DC | PRN
  Administered 2015-01-21: 3000 mL

## 2015-01-21 MED ORDER — ONDANSETRON HCL 4 MG/2ML IJ SOLN
INTRAMUSCULAR | Status: DC | PRN
  Administered 2015-01-21: 4 mg via INTRAVENOUS

## 2015-01-21 MED ORDER — PHENYLEPHRINE HCL 10 MG/ML IJ SOLN
INTRAMUSCULAR | Status: DC | PRN
  Administered 2015-01-21: 80 ug via INTRAVENOUS
  Administered 2015-01-21: 40 ug via INTRAVENOUS

## 2015-01-21 MED ORDER — DEXAMETHASONE SODIUM PHOSPHATE 10 MG/ML IJ SOLN
INTRAMUSCULAR | Status: DC | PRN
  Administered 2015-01-21: 4 mg via INTRAVENOUS

## 2015-01-21 MED ORDER — LIDOCAINE HCL (CARDIAC) 20 MG/ML IV SOLN
INTRAVENOUS | Status: AC
  Filled 2015-01-21: qty 5

## 2015-01-21 MED ORDER — PROPOFOL 10 MG/ML IV BOLUS
INTRAVENOUS | Status: AC
  Filled 2015-01-21: qty 20

## 2015-01-21 MED ORDER — LIDOCAINE HCL (CARDIAC) 20 MG/ML IV SOLN
INTRAVENOUS | Status: DC | PRN
Start: 2015-01-21 — End: 2015-01-21
  Administered 2015-01-21: 40 mg via INTRAVENOUS

## 2015-01-21 MED ORDER — FENTANYL CITRATE 0.05 MG/ML IJ SOLN
INTRAMUSCULAR | Status: AC
  Filled 2015-01-21: qty 2

## 2015-01-21 MED ORDER — LIDOCAINE HCL 1 % IJ SOLN
INTRAMUSCULAR | Status: AC
  Filled 2015-01-21: qty 20

## 2015-01-21 MED ORDER — ONDANSETRON HCL 4 MG/2ML IJ SOLN
INTRAMUSCULAR | Status: AC
  Filled 2015-01-21: qty 2

## 2015-01-21 MED ORDER — ONDANSETRON HCL 4 MG/2ML IJ SOLN: 4.0000 mg | Freq: Once | INTRAMUSCULAR | Status: DC | PRN

## 2015-01-21 MED ORDER — PROPOFOL 10 MG/ML IV EMUL
INTRAVENOUS | Status: AC
  Filled 2015-01-21: qty 50

## 2015-01-21 MED ORDER — FENTANYL CITRATE 0.05 MG/ML IJ SOLN
INTRAMUSCULAR | Status: DC | PRN
  Administered 2015-01-21: 50 ug via INTRAVENOUS

## 2015-01-21 MED ORDER — LIDOCAINE HCL 1 % IJ SOLN
INTRAMUSCULAR | Status: DC | PRN
  Administered 2015-01-21: 10 mL

## 2015-01-21 MED ORDER — PROPOFOL INFUSION 10 MG/ML OPTIME
INTRAVENOUS | Status: DC | PRN
  Administered 2015-01-21: 75 ug/kg/min via INTRAVENOUS

## 2015-01-21 MED ORDER — OXYCODONE-ACETAMINOPHEN 2.5-325 MG PO TABS: 1.0000 | ORAL_TABLET | ORAL | Status: DC | PRN

## 2015-01-21 MED ORDER — LACTATED RINGERS IV SOLN
INTRAVENOUS | Status: DC
  Administered 2015-01-21: 09:00:00 via INTRAVENOUS

## 2015-01-21 MED ORDER — PROPOFOL 10 MG/ML IV BOLUS
INTRAVENOUS | Status: DC | PRN
  Administered 2015-01-21: 100 mg via INTRAVENOUS

## 2015-01-21 MED ORDER — DEXAMETHASONE SODIUM PHOSPHATE 4 MG/ML IJ SOLN
INTRAMUSCULAR | Status: AC
  Filled 2015-01-21: qty 1

## 2015-01-21 MED ORDER — PHENYLEPHRINE 40 MCG/ML (10ML) SYRINGE FOR IV PUSH (FOR BLOOD PRESSURE SUPPORT)
PREFILLED_SYRINGE | INTRAVENOUS | Status: AC
  Filled 2015-01-21: qty 10

## 2015-01-21 SURGICAL SUPPLY — 14 items
CATH ROBINSON RED A/P 16FR (CATHETERS) ×3 IMPLANT
CLOTH BEACON ORANGE TIMEOUT ST (SAFETY) ×3 IMPLANT
CONTAINER PREFILL 10% NBF 60ML (FORM) ×6 IMPLANT
DEVICE MYOSURE LITE (MISCELLANEOUS) ×2 IMPLANT
FILTER ARTHROSCOPY CONVERTOR (FILTER) ×3 IMPLANT
GLOVE BIO SURGEON STRL SZ7 (GLOVE) ×6 IMPLANT
GOWN STRL REUS W/TWL LRG LVL3 (GOWN DISPOSABLE) ×6 IMPLANT
PACK VAGINAL MINOR WOMEN LF (CUSTOM PROCEDURE TRAY) ×3 IMPLANT
PAD OB MATERNITY 4.3X12.25 (PERSONAL CARE ITEMS) ×3 IMPLANT
SEAL ROD LENS SCOPE MYOSURE (ABLATOR) ×3 IMPLANT
TOWEL OR 17X24 6PK STRL BLUE (TOWEL DISPOSABLE) ×6 IMPLANT
TUBING AQUILEX INFLOW (TUBING) ×3 IMPLANT
TUBING AQUILEX OUTFLOW (TUBING) ×3 IMPLANT
WATER STERILE IRR 1000ML POUR (IV SOLUTION) ×3 IMPLANT

## 2015-01-21 NOTE — Op Note (Signed)
Bailey [name] December 02, 1932 [id number]   Post Operative Note   Date of surgery:  01/21/2015  Pre Op Dx:  Postmenopausal bleeding, endometrial polyp  Post Op Dx:  Postmenopausal bleeding, endometrial polyp  Procedure:  Hysteroscopy, D&C, Myosure endometrial polyp resection  Surgeon:  [name]  Anesthesia:  General  EBL:  minimal  Distended media discrepancy:  035 cc saline  Complications:  None  Specimen:  #1 endometrial polyp fragments #2 endometrial curetting to pathology  Findings: EUA:  External BUS vagina with atrophic changes. Cervix grossly normal. Uterus normal size midline mobile. Adnexa without masses   Hysteroscopy:  Adequate noting fundus, right/left tubal ostia, anterior/posterior endometrial surfaces, lower uterine segment, endometrial canal visualized. Single fingerlike polyp anterior lower uterine segment resected in its entirety to the level of the surrounding endometrium. Small 2-3 millimeter apparent submucous myoma also resected from anterior mid cavity.  Procedure:  The patient was taken to the operating room, was placed in the low dorsal lithotomy position, underwent general anesthesia without difficulty, received a vaginal/perineal preparation with Betadine solution, EUA performed and her bladder was emptied with an in and out catheterization done in sterile technique. The timeout was performed by the surgical team. The patient having received prophylactic antibiotics preoperatively.  The patient was draped in the usual fashion, the cervix visualized with a speculum and the anterior lip grasped with a single-tooth tenaculum. A paracervical block using 1% lidocaine was placed, 10 cc total. The cervix was gently dilated to admit the small Myosure hysteroscope and hysteroscopy was performed with findings noted above. Using the small Myosure Lite wand the polyp was resected in its entirety to level of the surrounding endometrium. A sharp curettage was then performed  and the specimen was sent separately to the pathologist. Repeat hysteroscopy was performed showing an empty cavity, good distention without evidence of perforation and adequate hemostasis. The instruments were removed with adequate hemostasis visualized at the external cervical os and tenaculum site. The patient was placed in the supine position and awakened without difficulty and taken to the recovery room in good condition having tolerated procedure well.    [name] MD, 9:35 AM 01/21/2015

## 2015-01-21 NOTE — H&P (Signed)
  The patient was examined.  I reviewed the proposed surgery and consent form with the patient.  The dictated history and physical is current and accurate and all questions were answered. The patient is ready to proceed with surgery and has a realistic understanding and expectation for the outcome.   [name] MD, 8:36 AM 01/21/2015

## 2015-01-21 NOTE — Discharge Instructions (Signed)
° °  Postoperative Instructions Hysteroscopy D & C ° °Dr. Janiqua Friscia and the nursing staff have discussed postoperative instructions with you.  If you have any questions please ask them before you leave the hospital, or call Dr Angeleah Labrake’s office at [phone].   ° °We would like to emphasize the following instructions: ° ° °? Call the office to make your follow-up appointment as recommended by Dr Latamara Melder (usually 1-2 weeks). ° °? You were given a prescription, or one was ordered for you at the pharmacy you designated.  Get that prescription filled and take the medication according to instructions. ° °? You may eat a regular diet, but slowly until you start having bowel movements. ° °? Drink plenty of water daily. ° °? Nothing in the vagina (intercourse, douching, objects of any kind) for two weeks.  When reinitiating intercourse, if it is uncomfortable, stop and make an appointment with Dr Kaio Kuhlman to be evaluated. ° °? No driving for one to two days until the effects of anesthesia has worn off.  No traveling out of town for several days. ° °? You may shower, but no baths for one week.  Walking up and down stairs is ok.  No heavy lifting, prolonged standing, repeated bending or any “working out” until your post op check. ° °? Rest frequently, listen to your body and do not push yourself and overdo it. ° °? Call if: ° °o Your pain medication does not seem strong enough. °o Worsening pain or abdominal bloating °o Persistent nausea or vomiting °o Difficulty with urination or bowel movements. °o Temperature of 101 degrees or higher. °o Heavy vaginal bleeding.  If your period is due, you may use tampons. °o You have any questions or concerns ° ° ° °

## 2015-01-21 NOTE — Anesthesia Procedure Notes (Signed)
Procedure Name: Intubation Date/Time: 01/21/2015 8:54 AM Performed by: Raenette Rover Pre-anesthesia Checklist: Patient identified, Patient being monitored, Emergency Drugs available and Suction available Patient Re-evaluated:Patient Re-evaluated prior to inductionOxygen Delivery Method: Circle system utilized Preoxygenation: Pre-oxygenation with 100% oxygen Intubation Type: IV induction Ventilation: Mask ventilation without difficulty LMA: LMA inserted LMA Size: 4.0 Number of attempts: 1 Airway Equipment and Method: Bite block Placement Confirmation: positive ETCO2,  CO2 detector and breath sounds checked- equal and bilateral Tube secured with: Tape Dental Injury: Teeth and Oropharynx as per pre-operative assessment

## 2015-01-21 NOTE — Transfer of Care (Signed)
Immediate Anesthesia Transfer of Care Note  Patient: Bailey [name]  Procedure(s) Performed: Procedure(s): DILATATION & CURETTAGE/HYSTEROSCOPY WITH MYOSURE (N/A)  Patient Location: PACU  Anesthesia Type:General  Level of Consciousness: awake, alert , oriented and patient cooperative  Airway & Oxygen Therapy: Patient Spontanous Breathing and Patient connected to nasal cannula oxygen  Post-op Assessment: Report given to RN and Post -op Vital signs reviewed and stable  Post vital signs: Reviewed and stable  Last Vitals:  Filed Vitals:   01/21/15 0758  BP: 134/57  Pulse: 74  Temp: 36.8 C  Resp: 18    Complications: No apparent anesthesia complications

## 2015-01-21 NOTE — Anesthesia Postprocedure Evaluation (Signed)
  Anesthesia Post-op Note  Patient: Bailey [name]  Procedure(s) Performed: Procedure(s) (LRB): DILATATION & CURETTAGE/HYSTEROSCOPY WITH MYOSURE (N/A)  Patient Location: PACU  Anesthesia Type: General  Level of Consciousness: awake and alert   Airway and Oxygen Therapy: Patient Spontanous Breathing  Post-op Pain: mild  Post-op Assessment: Post-op Vital signs reviewed, Patient's Cardiovascular Status Stable, Respiratory Function Stable, Patent Airway and No signs of Nausea or vomiting  Last Vitals:  Filed Vitals:   01/21/15 0930  BP: 184/67  Pulse: 66  Temp: 36.6 C  Resp: 19    Post-op Vital Signs: stable   Complications: No apparent anesthesia complications

## 2015-01-22 ENCOUNTER — Encounter (HOSPITAL_COMMUNITY): Payer: Self-pay | Admitting: Gynecology

## 2015-01-23 ENCOUNTER — Telehealth: Payer: Self-pay

## 2015-01-23 ENCOUNTER — Telehealth: Payer: Self-pay | Admitting: Gynecology

## 2015-01-23 NOTE — Telephone Encounter (Signed)
Pt had hysteroscopy, D&C on Tuesday. She said she felt great yesterday. Last pm started with cramps. Having "alot of cramps". No bleeding at all. Having "alot of loose stools too".  She went back to bed this morning and wanted me to check with you if this is normal?

## 2015-01-23 NOTE — Telephone Encounter (Signed)
I spoke with patient and relayed Dr. Dorette Grate note below. She has no fever or chills. She said no more loose stools since 7am.  She feels like it is settling down. She will call if persists or develops any fever or chills or other sx.

## 2015-01-23 NOTE — Telephone Encounter (Signed)
I would watch for now. Push fluids and use pain medicine as needed. Sometimes he antibiotics can give you diarrhea and she did get a prophylactic dose of antibiotics. If the diarrhea continues then we need to check her for C. Difficile at least for now I would watch it. If any fever chills or other symptoms then office visit

## 2015-01-23 NOTE — Telephone Encounter (Signed)
Received pathology which showed a benign polyp but the endometrial curetting specimen was labeled endocervical and Dr. Nicoletta Dress reported benign endocervical tissue but did not comment on endometrial. I called and asked her to review the slide. She states it was mostly mucus with endocervical mucosa but she did see some scant strips of atrophic endometrium. This is consistent with her hysteroscopy showing an endometrial pattern. I did ask her to have the lab relabeled the specimen endometrial curetting and not endocervical curetting.

## 2015-01-30 ENCOUNTER — Ambulatory Visit (INDEPENDENT_AMBULATORY_CARE_PROVIDER_SITE_OTHER): Payer: Medicare Other | Admitting: Gynecology

## 2015-01-30 ENCOUNTER — Encounter: Payer: Self-pay | Admitting: Gynecology

## 2015-01-30 VITALS — BP 124/74

## 2015-01-30 DIAGNOSIS — Z9889 Other specified postprocedural states: Secondary | ICD-10-CM

## 2015-01-30 DIAGNOSIS — N840 Polyp of corpus uteri: Secondary | ICD-10-CM

## 2015-01-30 DIAGNOSIS — N950 Postmenopausal bleeding: Secondary | ICD-10-CM

## 2015-01-30 DIAGNOSIS — Z961 Presence of intraocular lens: Secondary | ICD-10-CM | POA: Diagnosis not present

## 2015-01-30 NOTE — Patient Instructions (Signed)
Follow up in August when you're due for your annual exam.

## 2015-01-30 NOTE — Progress Notes (Signed)
Bailey [name] 1933-09-05 [id number]        81 y.o.  [id number] Presents for her postoperative visit status post hysteroscopic resection of endometrial polyp 01/09/2015. She's done well since then. Still has occasional spotting. Did have some diarrhea afterwards but this resolved.  Past medical history,surgical history, problem list, medications, allergies, family history and social history were all reviewed and documented in the EPIC chart.  Directed ROS with pertinent positives and negatives documented in the history of present illness/assessment and plan.  Exam: Kim assistant Filed Vitals:   01/30/15 1421  BP: 124/74   General appearance:  Normal Abdomen soft nontender without masses guarding rebound Pelvic external BUS vagina with atrophic changes. Cervix with atrophic changes. Uterus grossly normal size midline mobile nontender. Adnexa without masses or tenderness.  Assessment/Plan:  81 y.o. [id number] with normal postoperative checkup status post hysteroscopic resection of endometrial polyp. Reviewed benign pathology and pictures from the surgery. Patient will resume all normal activities and follow up in August 2016 when she is due for her annual exam. Sooner if any issues.     [name] MD, 2:37 PM 01/30/2015

## 2015-02-07 ENCOUNTER — Telehealth: Payer: Self-pay

## 2015-02-07 NOTE — Telephone Encounter (Signed)
Patient had surgery on 01/21/15 to remove polyp.  She wanted to check with you because she has some bleeding last night like before she had the polyp removed. Not bright red though.  She said she was on her feet all day yesterday. She wondered if this sounded ok?

## 2015-02-07 NOTE — Telephone Encounter (Signed)
Sounds okay. It sometimes takes a little longer for the uterine lining to heal over in patients who are postmenopausal. Call if it continues

## 2015-02-07 NOTE — Telephone Encounter (Signed)
Patient reassured. To call if persists.

## 2015-02-12 ENCOUNTER — Telehealth: Payer: Self-pay | Admitting: Cardiovascular Disease

## 2015-02-12 NOTE — Telephone Encounter (Signed)
SPOKE WITH PT   PHONE  CALL WAS  NOT  FROM  ME  AFTER  REVIEWING  CHART  APPEARS  PT IS  DUE  FOR  6 MO F/U APPT  MADE  FOR   03-13-15 AT  4:00 PM .Adonis Housekeeper

## 2015-02-12 NOTE — Telephone Encounter (Signed)
New Msg      Pt states she is returning call from today.    Please call.

## 2015-03-12 NOTE — Progress Notes (Signed)
Patient ID: Bailey [name], female   DOB: [birth year], 81 y.o.   MRN: [id number] Bailey [name] is seen today in followup for palpitations and PVCs. She saw Dr. Rayann Heman in February 2011 and he agreed to continue beta blocker therapy. she's been doing better. She had a trip to the beach which was exciting. Her palpitations have been well under control and she has not had any significant chest pain or syncope. SHe is happy to continue with her beta blocker. Her tests at the end of 2009 showed no evidence of structural heart disease. She continues to see Dr. Gershon Crane for her eye care. She does have a history of mitral valve prolapse and mild aortic insufficiency. She had some dyspnea at the beach was was unsettling along with some LE edema. No excess salt. Edema has happened in past due to standing and ankle varicosities  Last echo 2012 only trivial MR and mild AR  Had nice family reunion at [facility] Occasional palpitations Asked if it was ok to have a glass of brandy in late afternoon and I said yes   Echo reviewed 07/06/11 just mild AR  Study Conclusions  - Left ventricle: The cavity size was normal. Wall thickness was normal. Systolic function was normal. The estimated ejection fraction was in the range of 60% to 65%. Wall motion was normal; there were no regional wall motion abnormalities. Doppler parameters are consistent with abnormal left ventricular relaxation (grade 1 diastolic dysfunction). - Aortic valve: Mild regurgitation.  01/21/15 had surgery with general anesthesia.  HTN and increased PVC;s    ROS: Denies fever, malais, weight loss, blurry vision, decreased visual acuity, cough, sputum, SOB, hemoptysis, pleuritic pain, palpitaitons, heartburn, abdominal pain, melena, lower extremity edema, claudication, or rash.  All other systems reviewed and negative  General: Affect appropriate Healthy:  appears stated age 81: normal Neck supple with no adenopathy JVP normal no bruits no  thyromegaly Lungs clear with no wheezing and good diaphragmatic motion Heart:  S1/S2 no murmur, no rub, gallop or click PMI normal Abdomen: benighn, BS positve, no tenderness, no AAA no bruit.  No HSM or HJR Distal pulses intact with no bruits No edema Neuro non-focal Skin warm and dry No muscular weakness   Current Outpatient Prescriptions  Medication Sig Dispense Refill  . acyclovir (ZOVIRAX) 800 MG tablet Take 800 mg by mouth daily. Take once daily for 5 days prn for outbreak.    Marland Kitchen aspirin 81 MG tablet Take 81 mg by mouth daily.    . carvedilol (COREG) 3.125 MG tablet TAKE 1 TABLET BY MOUTH once A DAY WITH A MEAL 30 tablet 6  . Cholecalciferol (VITAMIN D) 400 UNITS capsule Take 2 capsules by mouth daily     . cycloSPORINE (RESTASIS) 0.05 % ophthalmic emulsion Place 1 drop into both eyes 2 (two) times daily.     . fish oil-omega-3 fatty acids 1000 MG capsule Take one capsule by mouth  daily    . hydrocortisone ([employer] CORTISONE MAXIMUM STRENGTH) 1 % ointment Apply 1 application topically 2 (two) times daily.    . naproxen sodium (ANAPROX) 220 MG tablet Take 220 mg by mouth daily.      No current facility-administered medications for this visit.    Allergies  Diamox; Epinephrine-lidocaine-na metabisulfite; Erythromycin; Penicillins; and Sulfonamide derivatives  Electrocardiogram:  09/04/14  SR rate 70  PVC otherwise normal   Assessment and Plan

## 2015-03-13 ENCOUNTER — Encounter: Payer: Self-pay | Admitting: Cardiovascular Disease

## 2015-03-13 ENCOUNTER — Ambulatory Visit (INDEPENDENT_AMBULATORY_CARE_PROVIDER_SITE_OTHER): Payer: Medicare Other | Admitting: Cardiovascular Disease

## 2015-03-13 VITALS — BP 160/80 | HR 70 | Ht 62.0 in | Wt 115.6 lb

## 2015-03-13 DIAGNOSIS — D649 Anemia, unspecified: Secondary | ICD-10-CM | POA: Insufficient documentation

## 2015-03-13 DIAGNOSIS — I10 Essential (primary) hypertension: Secondary | ICD-10-CM | POA: Diagnosis not present

## 2015-03-13 DIAGNOSIS — D500 Iron deficiency anemia secondary to blood loss (chronic): Secondary | ICD-10-CM | POA: Diagnosis not present

## 2015-03-13 DIAGNOSIS — Z79899 Other long term (current) drug therapy: Secondary | ICD-10-CM

## 2015-03-13 DIAGNOSIS — I493 Ventricular premature depolarization: Secondary | ICD-10-CM | POA: Diagnosis not present

## 2015-03-13 MED ORDER — CARVEDILOL 3.125 MG PO TABS: ORAL_TABLET | ORAL | Status: DC

## 2015-03-13 MED ORDER — LOSARTAN POTASSIUM 25 MG PO TABS: 25.0000 mg | ORAL_TABLET | Freq: Every day | ORAL | Status: DC

## 2015-03-13 NOTE — Assessment & Plan Note (Signed)
Some bleeding with uterine polyp removal.  Chronic anemia and unable to take iron  01/09/15 Hb  11.8  Patient requests recheck due to fatigue

## 2015-03-13 NOTE — Assessment & Plan Note (Signed)
Add cozaar in middle of day and coreg bid monitor at home BMET today f/u next available

## 2015-03-13 NOTE — Patient Instructions (Addendum)
Medication Instructions:  START LOSARTAN  25 MG  EVERY DAY  INCREASE CARVEDILOL TO  3.125 MG  TWICE  DAILY    Labwork:  TODAY   BMET  CBC   Testing/Procedures: NONE   Follow-Up:  4  WEEKS  WITH DR Johnsie Cancel  Any Other Special Instructions Will Be Listed Below (If Applicable).

## 2015-03-13 NOTE — Assessment & Plan Note (Signed)
Increase coreg to bid  Previous w/u for ischemia and structural heart disease negative

## 2015-03-14 LAB — BASIC METABOLIC PANEL
BUN: 21 mg/dL (ref 6–23)
CO2: 27 mEq/L (ref 19–32)
Calcium: 9.4 mg/dL (ref 8.4–10.5)
Chloride: 103 mEq/L (ref 96–112)
Creatinine, Ser: 0.98 mg/dL (ref 0.40–1.20)
GFR: 58.1 mL/min — ABNORMAL LOW (ref >60.00)
Glucose, Bld: 98 mg/dL (ref 70–99)
POTASSIUM: 4.3 meq/L (ref 3.5–5.1)
Sodium: 137 mEq/L (ref 135–145)

## 2015-03-14 LAB — CBC WITH DIFFERENTIAL/PLATELET
Basophils Absolute: 0 10*3/uL (ref 0.0–0.1)
Basophils Relative: 0.4 % (ref 0.0–3.0)
Eosinophils Absolute: 0.2 10*3/uL (ref 0.0–0.7)
Eosinophils Relative: 2.1 % (ref 0.0–5.0)
HCT: 38 % (ref 36.0–46.0)
Hemoglobin: 12.8 g/dL (ref 12.0–15.0)
LYMPHS ABS: 2.7 10*3/uL (ref 0.7–4.0)
LYMPHS PCT: 27.2 % (ref 12.0–46.0)
MCHC: 33.7 g/dL (ref 30.0–36.0)
MCV: 90.8 fl (ref 78.0–100.0)
Monocytes Absolute: 0.5 10*3/uL (ref 0.1–1.0)
Monocytes Relative: 5.3 % (ref 3.0–12.0)
NEUTROS ABS: 6.4 10*3/uL (ref 1.4–7.7)
Neutrophils Relative %: 65 % (ref 43.0–77.0)
Platelets: 293 10*3/uL (ref 150.0–400.0)
RBC: 4.18 Mil/uL (ref 3.87–5.11)
RDW: 13.1 % (ref 11.5–15.5)
WBC: 9.8 10*3/uL (ref 4.0–10.5)

## 2015-03-21 NOTE — Progress Notes (Signed)
Patient ID: Bailey [name], female   DOB: December 04, 1932, 80 y.o.   MRN: [id number] Bailey [name] is seen today in followup for palpitations and PVCs. She saw Dr. Rayann Heman in February 2011 and he agreed to continue beta blocker therapy. she's been doing better. She had a trip to the beach which was exciting. Her palpitations have been well under control and she has not had any significant chest pain or syncope. SHe is happy to continue with her beta blocker. Her tests at the end of 2009 showed no evidence of structural heart disease. She continues to see Dr. Gershon Crane for her eye care. She does have a history of mitral valve prolapse and mild aortic insufficiency. She had some dyspnea at the beach was was unsettling along with some LE edema. No excess salt. Edema has happened in past due to standing and ankle varicosities  Last echo 2012 only trivial MR and mild AR  Had nice family reunion at [facility] Occasional palpitations Asked if it was ok to have a glass of brandy in late afternoon and I said yes   Echo reviewed 07/06/11 just mild AR  Study Conclusions  - Left ventricle: The cavity size was normal. Wall thickness was normal. Systolic function was normal. The estimated ejection fraction was in the range of 60% to 65%. Wall motion was normal; there were no regional wall motion abnormalities. Doppler parameters are consistent with abnormal left ventricular relaxation (grade 1 diastolic dysfunction). - Aortic valve: Mild regurgitation.  01/21/15 had surgery with general anesthesia.  HTN and increased PVC;s    03/13/15  Cozaar added for BP control   Home readings are good  Looking forward to going to [place] for [employer] reunion and seeing [name]  ROS: Denies fever, malais, weight loss, blurry vision, decreased visual acuity, cough, sputum, SOB, hemoptysis, pleuritic pain, palpitaitons, heartburn, abdominal pain, melena, lower extremity edema, claudication, or rash.  All other systems reviewed and  negative  General: Affect appropriate Healthy:  appears stated age 58: normal Neck supple with no adenopathy JVP normal no bruits no thyromegaly Lungs clear with no wheezing and good diaphragmatic motion Heart:  S1/S2 no murmur, no rub, gallop or click PMI normal Abdomen: benighn, BS positve, no tenderness, no AAA no bruit.  No HSM or HJR Distal pulses intact with no bruits No edema Neuro non-focal Skin warm and dry No muscular weakness   Current Outpatient Prescriptions  Medication Sig Dispense Refill  . acyclovir (ZOVIRAX) 800 MG tablet Take 800 mg by mouth daily. Take once daily for 5 days prn for outbreak.    Marland Kitchen aspirin 81 MG tablet Take 81 mg by mouth daily.    . carvedilol (COREG) 3.125 MG tablet 1 TAB  TWICE  A  DAY 180 tablet 3  . Cholecalciferol (VITAMIN D) 400 UNITS capsule Take 2 capsules by mouth daily     . cycloSPORINE (RESTASIS) 0.05 % ophthalmic emulsion Place 1 drop into both eyes 2 (two) times daily.     . fish oil-omega-3 fatty acids 1000 MG capsule Take one capsule by mouth  daily    . hydrocortisone (CVS CORTISONE MAXIMUM STRENGTH) 1 % ointment Apply 1 application topically 2 (two) times daily.    Marland Kitchen losartan (COZAAR) 25 MG tablet Take 1 tablet (25 mg total) by mouth daily. 90 tablet 3  . naproxen sodium (ANAPROX) 220 MG tablet Take 220 mg by mouth daily.     Marland Kitchen PREMPRO 0.45-1.5 MG per tablet Take 1 tablet by mouth  daily.  11   No current facility-administered medications for this visit.    Allergies  Diamox; Epinephrine-lidocaine-na metabisulfite; Erythromycin; Penicillins; and Sulfonamide derivatives  Electrocardiogram:  09/04/14  SR rate 70  PVC otherwise normal   Assessment and Plan

## 2015-03-24 ENCOUNTER — Encounter: Payer: Self-pay | Admitting: Cardiovascular Disease

## 2015-03-24 ENCOUNTER — Ambulatory Visit (INDEPENDENT_AMBULATORY_CARE_PROVIDER_SITE_OTHER): Payer: Medicare Other | Admitting: Cardiovascular Disease

## 2015-03-24 VITALS — BP 142/64 | HR 55 | Ht 62.0 in | Wt 116.8 lb

## 2015-03-24 DIAGNOSIS — I480 Paroxysmal atrial fibrillation: Secondary | ICD-10-CM | POA: Diagnosis not present

## 2015-03-24 DIAGNOSIS — M069 Rheumatoid arthritis, unspecified: Secondary | ICD-10-CM | POA: Diagnosis not present

## 2015-03-24 DIAGNOSIS — I10 Essential (primary) hypertension: Secondary | ICD-10-CM

## 2015-03-24 NOTE — Assessment & Plan Note (Signed)
maint NSR no significant palpitations stable

## 2015-03-24 NOTE — Assessment & Plan Note (Signed)
Avoid NSAI's with HTN  Continue walking program f/u primary

## 2015-03-24 NOTE — Patient Instructions (Signed)
Medication Instructions:  None today.  Labwork: None today.  Testing/Procedures: None today.  Follow-Up: Your physician wants you to follow-up in: 6 months with Dr Johnsie Cancel (October 2016). You will receive a reminder letter in the mail two months in advance. If you don't receive a letter, please call our office to schedule the follow-up appointment.

## 2015-03-24 NOTE — Assessment & Plan Note (Signed)
Well controlled.  Continue current medications and low sodium Dash type diet.    

## 2015-03-26 ENCOUNTER — Other Ambulatory Visit: Payer: Self-pay | Admitting: Cardiovascular Disease

## 2015-04-03 DIAGNOSIS — R5381 Other malaise: Secondary | ICD-10-CM | POA: Diagnosis not present

## 2015-04-03 DIAGNOSIS — M3500 Sicca syndrome, unspecified: Secondary | ICD-10-CM | POA: Diagnosis not present

## 2015-04-03 DIAGNOSIS — M19241 Secondary osteoarthritis, right hand: Secondary | ICD-10-CM | POA: Diagnosis not present

## 2015-04-03 DIAGNOSIS — M19271 Secondary osteoarthritis, right ankle and foot: Secondary | ICD-10-CM | POA: Diagnosis not present

## 2015-04-03 DIAGNOSIS — I7300 Raynaud's syndrome without gangrene: Secondary | ICD-10-CM | POA: Diagnosis not present

## 2015-04-03 DIAGNOSIS — R300 Dysuria: Secondary | ICD-10-CM | POA: Diagnosis not present

## 2015-04-03 DIAGNOSIS — Z79899 Other long term (current) drug therapy: Secondary | ICD-10-CM | POA: Diagnosis not present

## 2015-04-07 ENCOUNTER — Telehealth: Payer: Self-pay | Admitting: *Deleted

## 2015-04-07 NOTE — Telephone Encounter (Signed)
Pt informed with the below note. 

## 2015-04-07 NOTE — Telephone Encounter (Signed)
Pt called to follow up post hysteroscopic resection of endometrial polyp 01/09/2015. Pt said last night she noticed some dark blood on her pad, didn't fill pad up, but was not a spot as well, quarter size amount. No dark blood today.  Pt will be leaving out of town for 2 weeks for [place] and thought she should relay this information. Asked if this normal? Recommendations?  Please advise

## 2015-04-07 NOTE — Telephone Encounter (Signed)
It is a little unusual. But at this point I would do nothing different but just watch it. If it would continue then recommend office visit when she returns.

## 2015-04-16 ENCOUNTER — Ambulatory Visit: Payer: Medicare Other | Admitting: Cardiovascular Disease

## 2015-04-21 ENCOUNTER — Ambulatory Visit: Payer: Medicare Other | Admitting: Family Medicine

## 2015-04-23 ENCOUNTER — Ambulatory Visit: Payer: Medicare Other | Admitting: Cardiovascular Disease

## 2015-04-24 ENCOUNTER — Ambulatory Visit: Payer: Medicare Other | Admitting: Family Medicine

## 2015-05-01 ENCOUNTER — Ambulatory Visit (INDEPENDENT_AMBULATORY_CARE_PROVIDER_SITE_OTHER): Payer: Medicare Other | Admitting: Gynecology

## 2015-05-01 ENCOUNTER — Encounter: Payer: Self-pay | Admitting: Gynecology

## 2015-05-01 VITALS — BP 130/70

## 2015-05-01 DIAGNOSIS — N950 Postmenopausal bleeding: Secondary | ICD-10-CM | POA: Diagnosis not present

## 2015-05-01 NOTE — Patient Instructions (Signed)
Call if vaginal bleeding returns.

## 2015-05-01 NOTE — Progress Notes (Signed)
Bailey [name] [birth year] [id number]        81 y.o.  [id number] presents  Having undergone a hysteroscopic resection of a benign endometrial polyp with D&C showing atrophic endometrium 01/21/2015.  Did well postoperatively other than had spotting on and off for several weeks following the surgery. This resolved and then she had an episode of bleeding that started the first week of May and she bled on and off for about 10 days. Has not had any bleeding of the last 2 weeks. No pain associated with this. She was very active at the time it started.  Past medical history,surgical history, problem list, medications, allergies, family history and social history were all reviewed and documented in the EPIC chart.  Directed ROS with pertinent positives and negatives documented in the history of present illness/assessment and plan.  Exam: Kim assistant Filed Vitals:   05/01/15 1232  BP: 130/70   General appearance:  Normal Abdomen soft nontender without masses guarding rebound Pelvic external BUS vagina with atrophic changes. Cervix atrophic. Uterus normal size midline mobile nontender. Adnexa without masses or tenderness.  Assessment/Plan:  81 y.o. [id number] with episode of postmenopausal bleeding approximately 10-11 weeks after hysteroscopic resection of endometrial polyp. Reviewed pictures from the surgery which clearly showed an adequate hysteroscopy with an atrophic endometrial pattern. Also showed the polyp before removal. Pathology was benign confirming an endometrial polyp with atrophic endometrium. Reviewed with patient I feel probably she was having some delayed healing due to her atrophic endometrium and with the activity may have precipitated some bleeding. This close to the hysteroscopic resection and benign pathology do not think anything more significant is occurring such as regrowth of polyp or precancerous changes. Recommend just observing now she is not bleeding. If she does have a repetitive  episode of bleeding she is to call me I may consider a low-dose estrogen supplementation to help stabilize the endometrium. Risks of low-dose estrogen were reviewed to include increased risk of thrombosis given her age and other medical issues. She had been on HRT in the past and did well with this. Patient will call if she does any further bleeding otherwise will follow up routinely.    [name] MD, 12:53 PM 05/01/2015

## 2015-05-06 ENCOUNTER — Encounter: Payer: Self-pay | Admitting: Family Medicine

## 2015-05-06 ENCOUNTER — Ambulatory Visit: Payer: Medicare Other | Admitting: Family Medicine

## 2015-05-06 ENCOUNTER — Ambulatory Visit (INDEPENDENT_AMBULATORY_CARE_PROVIDER_SITE_OTHER): Payer: Medicare Other | Admitting: Family Medicine

## 2015-05-06 VITALS — BP 130/62 | HR 69 | Temp 98.7°F | Wt 118.0 lb

## 2015-05-06 DIAGNOSIS — I10 Essential (primary) hypertension: Secondary | ICD-10-CM

## 2015-05-06 DIAGNOSIS — D649 Anemia, unspecified: Secondary | ICD-10-CM | POA: Diagnosis not present

## 2015-05-06 MED ORDER — CIPROFLOXACIN HCL 500 MG PO TABS: 500.0000 mg | ORAL_TABLET | Freq: Two times a day (BID) | ORAL | Status: DC

## 2015-05-06 NOTE — Patient Instructions (Signed)
BP looks great  Script sent in for traveler's diarrhea.

## 2015-05-06 NOTE — Progress Notes (Signed)
Bailey Reddish, MD  Subjective:  Bailey [name] is a 81 y.o. year old very pleasant female patient who presents with:  Hypertension-controlled with recent start of losartan by cards BP Readings from Last 3 Encounters:  05/06/15 130/62  05/01/15 130/70  03/24/15 142/64   Home BP monitoring-yes and had elevations into 150s or 160s prior to losartan Compliant with medications-yes without side effects ROS-Denies any CP, HA, SOB, blurry vision, LE edema  Anemia- stable likely of chronic disease -Patient with several year history of anemia. States usually between 11-12 but recent #s show tends to run 11-13 and closer to 13 which is actually in normal range. Recent CBC shows controlled ROS- no fatigue, chest pain, shortness of breath  Past Medical History- PAF, mitral valve prolapse, sjogren's, rheumatoid arthritis, raynauds  Medications- reviewed and updated Current Outpatient Prescriptions  Medication Sig Dispense Refill  . aspirin 81 MG tablet Take 81 mg by mouth daily.    . carvedilol (COREG) 3.125 MG tablet 1 TAB  TWICE  A  DAY 180 tablet 3  . Cholecalciferol (VITAMIN D) 400 UNITS capsule Take 2 capsules by mouth daily     . fish oil-omega-3 fatty acids 1000 MG capsule Take one capsule by mouth  daily    . losartan (COZAAR) 25 MG tablet Take 1 tablet (25 mg total) by mouth daily. 90 tablet 3  . naproxen sodium (ANAPROX) 220 MG tablet Take 220 mg by mouth daily.     Marland Kitchen PREMPRO 0.45-1.5 MG per tablet Take 1 tablet by mouth daily.  11  . acyclovir (ZOVIRAX) 800 MG tablet Take 800 mg by mouth daily. Take once daily for 5 days prn for outbreak.    . cycloSPORINE (RESTASIS) 0.05 % ophthalmic emulsion Place 1 drop into both eyes 2 (two) times daily.     . hydrocortisone (CVS CORTISONE MAXIMUM STRENGTH) 1 % ointment Apply 1 application topically 2 (two) times daily.     Objective: BP 130/62 mmHg  Pulse 69  Temp(Src) 98.7 F (37.1 C)  Wt 118 lb (53.524 kg) Gen: NAD, resting comfortably in  chair CV: RRR no murmurs rubs or gallops Lungs: CTAB no crackles, wheeze, rhonchi Abdomen: soft/nontender/nondistended/normal bowel sounds. No rebound or guarding.  Ext: no edema Skin: warm, dry Neuro: grossly normal, moves all extremities   Assessment/Plan:  HTN (hypertension) Controlled/continue Losartan 50mg  midday. Coreg 3.25 mg BID  Anemia Runs 11-13 hgb. ? Chronic disease related. Most recent CBC by Dr. Johnsie Cancel was actually in normal range. Up to date on colonoscopy and no reported bleeding   [place] trip coming up- script for traveler's diarrhea given as well as instructions Meds ordered this encounter  Medications  . ciprofloxacin (CIPRO) 500 MG tablet    Sig: Take 1 tablet (500 mg total) by mouth 2 (two) times daily. For traveler's diarrhea (do not use if blood in stool-seek care)    Dispense:  6 tablet    Refill:  0

## 2015-05-07 NOTE — Assessment & Plan Note (Addendum)
Runs 11-13 hgb. ? Chronic disease related. Most recent CBC by Dr. Johnsie Cancel was actually in normal range. Up to date on colonoscopy and no reported bleeding

## 2015-05-07 NOTE — Assessment & Plan Note (Signed)
Controlled/continue Losartan 50mg  midday. Coreg 3.25 mg BID

## 2015-07-01 DIAGNOSIS — B353 Tinea pedis: Secondary | ICD-10-CM | POA: Diagnosis not present

## 2015-07-01 DIAGNOSIS — L820 Inflamed seborrheic keratosis: Secondary | ICD-10-CM | POA: Diagnosis not present

## 2015-07-01 DIAGNOSIS — Z85828 Personal history of other malignant neoplasm of skin: Secondary | ICD-10-CM | POA: Diagnosis not present

## 2015-07-08 DIAGNOSIS — Z1231 Encounter for screening mammogram for malignant neoplasm of breast: Secondary | ICD-10-CM | POA: Diagnosis not present

## 2015-07-11 ENCOUNTER — Encounter: Payer: Self-pay | Admitting: Gynecology

## 2015-07-18 ENCOUNTER — Ambulatory Visit (INDEPENDENT_AMBULATORY_CARE_PROVIDER_SITE_OTHER): Payer: Medicare Other | Admitting: Gynecology

## 2015-07-18 ENCOUNTER — Encounter: Payer: Self-pay | Admitting: Gynecology

## 2015-07-18 VITALS — BP 124/72 | Ht 62.0 in | Wt 118.0 lb

## 2015-07-18 DIAGNOSIS — N952 Postmenopausal atrophic vaginitis: Secondary | ICD-10-CM

## 2015-07-18 DIAGNOSIS — Z01419 Encounter for gynecological examination (general) (routine) without abnormal findings: Secondary | ICD-10-CM | POA: Diagnosis not present

## 2015-07-18 DIAGNOSIS — N816 Rectocele: Secondary | ICD-10-CM

## 2015-07-18 DIAGNOSIS — M8580 Other specified disorders of bone density and structure, unspecified site: Secondary | ICD-10-CM

## 2015-07-18 DIAGNOSIS — Z79890 Hormone replacement therapy (postmenopausal): Secondary | ICD-10-CM | POA: Diagnosis not present

## 2015-07-18 MED ORDER — PREMPRO 0.45-1.5 MG PO TABS: 1.0000 | ORAL_TABLET | Freq: Every day | ORAL | Status: DC

## 2015-07-18 NOTE — Patient Instructions (Signed)

## 2015-07-18 NOTE — Progress Notes (Signed)
Bailey [name] [birth year] [id number]        81 y.o.  [id number] for breast and pelvic exam. Several issues noted below.  Past medical history,surgical history, problem list, medications, allergies, family history and social history were all reviewed and documented as reviewed in the EPIC chart.  ROS:  Performed with pertinent positives and negatives included in the history, assessment and plan.   Additional significant findings :  none   Exam: Kim Counsellor Vitals:   07/18/15 1401  BP: 124/72  Height: 5\' 2"  (1.575 m)  Weight: 118 lb (53.524 kg)   General appearance:  Normal affect, orientation and appearance. Skin: Grossly normal HEENT: Without gross lesions.  No cervical or supraclavicular adenopathy. Thyroid normal.  Lungs:  Clear without wheezing, rales or rhonchi Cardiac: RR, without RMG Abdominal:  Soft, nontender, without masses, guarding, rebound, organomegaly or hernia Breasts:  Examined lying and sitting without masses, retractions, discharge or axillary adenopathy. Pelvic:  Ext/BUS/vagina with atrophic changes. Mild rectocele noted  Cervix with atrophic changes  Uterus anteverted, normal size, shape and contour, midline and mobile nontender   Adnexa  Without masses or tenderness    Anus and perineum  Normal   Rectovaginal  Normal sphincter tone without palpated masses or tenderness.    Assessment/Plan:  81 y.o. [id number] female for breast and pelvic exam.   1. Postmenopausal/atrophic genital changes/HRT.  Patient continues on Prempro 0.45/1.5. Doing well wants to continue. Had episode of postmenopausal bleeding earlier this year with hysteroscopy D&C and removal of a benign endometrial polyp. She feels better on HRT wants to continue. I reviewed the whole issue of HRT particularly with aging and the increased risk of stroke heart attack DVT and breast cancer. Unusual nature of continue with this into the 80s. Patient clearly understands the issues and wants to continue I  refilled her 1 year. 2. Rectocele, mild. Asymptomatic to the patient. Stable on serial exams. Follow with annual exams. Report any symptoms. 3. Pap smear 2012. No Pap smear done today. No history of significant abnormal Pap smears. Based on age we both agreed to stop screening per current screening guidelines. 4. Osteopenia.  DEXA 2015 T score -1.7 without significant change from prior DEXA. Plan repeat next year to year interval. Increase calcium vitamin D reviewed. 5. Mammography 06/2015. Continue with annual mammography. SBE monthly reviewed. 6. Colonoscopy 2013. Repeat at their recommended interval. 7. Health maintenance. No routine blood work done as this is done at her primary physician's office. Follow up 1 year, sooner as needed.   [name] MD, 2:26 PM 07/18/2015

## 2015-08-04 ENCOUNTER — Other Ambulatory Visit: Payer: Self-pay | Admitting: Gynecology

## 2015-09-29 ENCOUNTER — Encounter: Payer: Self-pay | Admitting: *Deleted

## 2015-09-29 NOTE — Progress Notes (Signed)
Patient ID: Bailey [name], female   DOB: March 11, 1933, 81 y.o.   MRN: [id number] Bailey [name] is seen today in followup for palpitations and PVCs. She saw Dr. Rayann Heman in February 2011 and he agreed to continue beta blocker therapy. she's been doing better. She had a trip to the beach which was exciting. Her palpitations have been well under control and she has not had any significant chest pain or syncope. SHe is happy to continue with her beta blocker. Her tests at the end of 2009 showed no evidence of structural heart disease. She continues to see Dr. Gershon Crane for her eye care. She does have a history of mitral valve prolapse and mild aortic insufficiency. She had some dyspnea at the beach was was unsettling along with some LE edema. No excess salt. Edema has happened in past due to standing and ankle varicosities  Last echo 2012 only trivial MR and mild AR  Had nice family reunion at [facility] Occasional palpitations Asked if it was ok to have a glass of brandy in late afternoon and I said yes   Echo reviewed 07/06/11 just mild AR  Study Conclusions  - Left ventricle: The cavity size was normal. Wall thickness was normal. Systolic function was normal. The estimated ejection fraction was in the range of 60% to 65%. Wall motion was normal; there were no regional wall motion abnormalities. Doppler parameters are consistent with abnormal left ventricular relaxation (grade 1 diastolic dysfunction). - Aortic valve: Mild regurgitation.  01/21/15 had surgery with general anesthesia.  HTN and increased PVC;s    03/13/15  Cozaar added for BP control  Better   Home readings are good  Looking forward to going to [place] for [employer] reunion and seeing Bailey [name]  ROS: Denies fever, malais, weight loss, blurry vision, decreased visual acuity, cough, sputum, SOB, hemoptysis, pleuritic pain, palpitaitons, heartburn, abdominal pain, melena, lower extremity edema, claudication, or rash.  All other systems  reviewed and negative  General: Affect appropriate Healthy:  appears stated age 65: normal Neck supple with no adenopathy JVP normal no bruits no thyromegaly Lungs clear with no wheezing and good diaphragmatic motion Heart:  S1/S2 no murmur, no rub, gallop or click PMI normal Abdomen: benighn, BS positve, no tenderness, no AAA no bruit.  No HSM or HJR Distal pulses intact with no bruits No edema Neuro non-focal Skin warm and dry No muscular weakness   Current Outpatient Prescriptions  Medication Sig Dispense Refill  . acyclovir (ZOVIRAX) 800 MG tablet Take 800 mg by mouth daily. Take once daily for 5 days prn for outbreak.    Marland Kitchen aspirin 81 MG tablet Take 81 mg by mouth daily.    . carvedilol (COREG) 3.125 MG tablet Take 3.125 mg by mouth 2 (two) times daily with a meal.    . Cholecalciferol (VITAMIN D) 400 UNITS capsule Take 2 capsules by mouth daily     . cycloSPORINE (RESTASIS) 0.05 % ophthalmic emulsion Place 1 drop into both eyes 2 (two) times daily.     . fish oil-omega-3 fatty acids 1000 MG capsule Take one capsule by mouth  daily    . hydrocortisone (CVS CORTISONE MAXIMUM STRENGTH) 1 % ointment Apply 1 application topically 2 (two) times daily.    Marland Kitchen losartan (COZAAR) 25 MG tablet Take 1 tablet (25 mg total) by mouth daily. 90 tablet 3  . naproxen sodium (ANAPROX) 220 MG tablet Take 220 mg by mouth daily.     Marland Kitchen PREMPRO 0.45-1.5 MG per tablet  Take 1 tablet by mouth daily. 30 tablet 12  . PREMPRO 0.45-1.5 MG per tablet TAKE 1 TABLET BY MOUTH DAILY. 28 tablet 11   No current facility-administered medications for this visit.    Allergies  Diamox; Epinephrine-lidocaine-na metabisulfite; Erythromycin; Penicillins; and Sulfonamide derivatives  Electrocardiogram:  09/04/14  SR rate 70  PVC otherwise normal  10/01/15  SR rate 69  Normal ECG    Assessment and Plan Palpitations:  PAC;s /PVC;s  Stable on beta blocker  AR:  Mild by echo 2012  F/u echo Pulse pressure not wide and  soft murmur HTN:  Improved with cozaar ARB advantageous for AR as well Edema:  Venous wearing compression stockings improved f/u dermatology   Jenkins Rouge

## 2015-10-01 ENCOUNTER — Encounter: Payer: Self-pay | Admitting: Cardiovascular Disease

## 2015-10-01 ENCOUNTER — Ambulatory Visit (INDEPENDENT_AMBULATORY_CARE_PROVIDER_SITE_OTHER): Payer: Medicare Other | Admitting: Cardiovascular Disease

## 2015-10-01 VITALS — BP 146/62 | HR 69 | Ht 62.0 in | Wt 120.0 lb

## 2015-10-01 DIAGNOSIS — I480 Paroxysmal atrial fibrillation: Secondary | ICD-10-CM | POA: Diagnosis not present

## 2015-10-01 DIAGNOSIS — I10 Essential (primary) hypertension: Secondary | ICD-10-CM

## 2015-10-01 DIAGNOSIS — Z23 Encounter for immunization: Secondary | ICD-10-CM | POA: Diagnosis not present

## 2015-10-01 NOTE — Patient Instructions (Signed)
Medication Instructions:  Your physician recommends that you continue on your current medications as directed. Please refer to the Current Medication list given to you today.  Labwork: NONE  Testing/Procedures: NONE  Follow-Up: Your physician wants you to follow-up in: [place]. Johnsie Cancel. You will receive a reminder letter in the mail two months in advance. If you don't receive a letter, please call our office to schedule the follow-up appointment.      If you need a refill on your cardiac medications before your next appointment, please call your pharmacy.

## 2015-10-01 NOTE — Addendum Note (Signed)
Addended by: Roberts Gaudy on: 10/01/2015 02:50 PM   Modules accepted: Orders

## 2015-10-02 DIAGNOSIS — M3500 Sicca syndrome, unspecified: Secondary | ICD-10-CM | POA: Diagnosis not present

## 2015-10-02 DIAGNOSIS — M19241 Secondary osteoarthritis, right hand: Secondary | ICD-10-CM | POA: Diagnosis not present

## 2015-10-02 DIAGNOSIS — M8580 Other specified disorders of bone density and structure, unspecified site: Secondary | ICD-10-CM | POA: Diagnosis not present

## 2015-10-02 DIAGNOSIS — I7300 Raynaud's syndrome without gangrene: Secondary | ICD-10-CM | POA: Diagnosis not present

## 2015-10-17 DIAGNOSIS — R5381 Other malaise: Secondary | ICD-10-CM | POA: Diagnosis not present

## 2015-10-17 DIAGNOSIS — Z79899 Other long term (current) drug therapy: Secondary | ICD-10-CM | POA: Diagnosis not present

## 2015-10-17 DIAGNOSIS — E559 Vitamin D deficiency, unspecified: Secondary | ICD-10-CM | POA: Diagnosis not present

## 2015-10-18 ENCOUNTER — Encounter: Payer: Self-pay | Admitting: Cardiovascular Disease

## 2015-10-18 ENCOUNTER — Encounter: Payer: Self-pay | Admitting: Gynecology

## 2015-10-30 ENCOUNTER — Encounter: Payer: Self-pay | Admitting: Pulmonary Disease

## 2015-11-04 DIAGNOSIS — L821 Other seborrheic keratosis: Secondary | ICD-10-CM | POA: Diagnosis not present

## 2015-11-04 DIAGNOSIS — I8311 Varicose veins of right lower extremity with inflammation: Secondary | ICD-10-CM | POA: Diagnosis not present

## 2015-11-04 DIAGNOSIS — Z85828 Personal history of other malignant neoplasm of skin: Secondary | ICD-10-CM | POA: Diagnosis not present

## 2015-11-04 DIAGNOSIS — D1801 Hemangioma of skin and subcutaneous tissue: Secondary | ICD-10-CM | POA: Diagnosis not present

## 2015-11-04 DIAGNOSIS — I8312 Varicose veins of left lower extremity with inflammation: Secondary | ICD-10-CM | POA: Diagnosis not present

## 2015-11-04 DIAGNOSIS — I872 Venous insufficiency (chronic) (peripheral): Secondary | ICD-10-CM | POA: Diagnosis not present

## 2015-11-04 DIAGNOSIS — B351 Tinea unguium: Secondary | ICD-10-CM | POA: Diagnosis not present

## 2015-11-06 ENCOUNTER — Ambulatory Visit: Payer: Medicare Other | Admitting: Family Medicine

## 2015-11-10 ENCOUNTER — Ambulatory Visit (INDEPENDENT_AMBULATORY_CARE_PROVIDER_SITE_OTHER): Payer: Medicare Other | Admitting: Family Medicine

## 2015-11-10 ENCOUNTER — Encounter: Payer: Self-pay | Admitting: Family Medicine

## 2015-11-10 VITALS — BP 120/64 | HR 77 | Temp 98.3°F | Wt 120.0 lb

## 2015-11-10 DIAGNOSIS — I480 Paroxysmal atrial fibrillation: Secondary | ICD-10-CM

## 2015-11-10 DIAGNOSIS — I10 Essential (primary) hypertension: Secondary | ICD-10-CM | POA: Diagnosis not present

## 2015-11-10 NOTE — Progress Notes (Signed)
Garret Reddish, MD  Subjective:  Bailey [name] is a 81 y.o. year old very pleasant female patient who presents for/with See problem oriented charting ROS- No chest pain or shortness of breath. No headache or blurry vision. On coreg has not had palpitations  Past Medical History-  Patient Active Problem List   Diagnosis Date Noted  . PAROXYSMAL ATRIAL FIBRILLATION 09/05/2008    Priority: High  . HTN (hypertension) 03/13/2015    Priority: Medium  . Anemia 03/13/2015    Priority: Medium  . Raynauds syndrome 10/21/2014    Priority: Medium  . [place] SYNDROME 08/20/2010    Priority: Medium  . Rheumatoid arthritis ([place]) 08/21/2009    Priority: Medium  . PVC (premature ventricular contraction) 06/04/2009    Priority: Medium  . Mitral valve prolapse 12/25/2008    Priority: Medium  . 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 Edema 06/29/2011    Priority: Low  . Osteopenia 09/05/2008    Priority: Low  . GERD 09/04/2008    Priority: Low  . Irritable bowel syndrome 09/04/2008    Priority: Low  . ALLERGY 09/04/2008    Priority: Low    Medications- reviewed and updated Current Outpatient Prescriptions  Medication Sig Dispense Refill  . aspirin 81 MG tablet Take 81 mg by mouth daily.    . carvedilol (COREG) 3.125 MG tablet Take 3.125 mg by mouth 2 (two) times daily with a meal.    . Cholecalciferol (VITAMIN D) 400 UNITS capsule Take 2 capsules by mouth daily     . cycloSPORINE (RESTASIS) 0.05 % ophthalmic emulsion Place 1 drop into both eyes 2 (two) times daily.     . fish oil-omega-3 fatty acids 1000 MG capsule Take one capsule by mouth  daily    . losartan (COZAAR) 25 MG tablet Take 1 tablet (25 mg total) by mouth daily. 90 tablet 3  . naproxen sodium (ANAPROX) 220 MG tablet Take 220 mg by mouth daily.     Marland Kitchen PREMPRO 0.45-1.5 MG per tablet Take 1 tablet by mouth daily. 30 tablet 12  . acyclovir (ZOVIRAX) 800 MG tablet Take 800 mg by mouth daily. Take once daily for 5  days prn for outbreak.    . hydrocortisone (CVS CORTISONE MAXIMUM STRENGTH) 1 % ointment Apply 1 application topically 2 (two) times daily.     No current facility-administered medications for this visit.    Objective: BP 120/64 mmHg  Pulse 77  Temp(Src) 98.3 F (36.8 C)  Wt 120 lb (54.432 kg) Gen: NAD, resting comfortably, appears stated age CV: RRR no murmurs rubs or gallops Lungs: CTAB no crackles, wheeze, rhonchi Abdomen: soft/nontender/nondistended/normal bowel sounds. No rebound or guarding.  Ext: no edema Skin: warm, dry Neuro: grossly normal, moves all extremities  Assessment/Plan:  PAROXYSMAL ATRIAL FIBRILLATION S: appears in sinus today and has been for years. Compliant with ASA 81 mg, carvedilol 3.125 mg BID. Following up with Dr. Johnsie Cancel. Chadsvasc score of 4- would need anticoagulation if recurrent A/P: continue asa 81, coreg 3.125mg  BID.    HTN (hypertension) S: controlled. On  Losartan 25mg  midday. Coreg 3.25 mg BID BP Readings from Last 3 Encounters:  11/10/15 120/64  10/01/15 146/62  07/18/15 124/72  A/P:Continue current meds with BP goal <140/90   Left buttocks pain radiating to left knee S: on and off for years since childbirth. Now with some mild pain that comes and goes- responds to heating pad O: FABER positive. Pain over SI joint on left >  R A/P: suspect left SI joint inflammation. Patient does not want medicine for this other than aleve. encouraged continued use of prn heating pad, can also trial ice  6 months or sooner if needed

## 2015-11-10 NOTE — Assessment & Plan Note (Signed)
S: controlled. On  Losartan 25mg  midday. Coreg 3.25 mg BID BP Readings from Last 3 Encounters:  11/10/15 120/64  10/01/15 146/62  07/18/15 124/72  A/P:Continue current meds with BP goal <140/90

## 2015-11-10 NOTE — Assessment & Plan Note (Signed)
S: appears in sinus today and has been for years. Compliant with ASA 81 mg, carvedilol 3.125 mg BID. Following up with Dr. Johnsie Cancel. Chadsvasc score of 4- would need anticoagulation if recurrent A/P: continue asa 81, coreg 3.125mg  BID.

## 2015-11-10 NOTE — Patient Instructions (Signed)
Overall things look good today See you back in 6 months to check in  For your left back pain that radiates into your leg. Your Left sacroiliac joint appears inflamed. Icing it when it gets aggravated for a few days may help then switch to heat. The aleve should help as well.

## 2016-02-14 ENCOUNTER — Other Ambulatory Visit: Payer: Self-pay | Admitting: Cardiovascular Disease
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
## 2016-03-25 DIAGNOSIS — H04123 Dry eye syndrome of bilateral lacrimal glands: Secondary | ICD-10-CM | POA: Diagnosis not present

## 2016-03-25 DIAGNOSIS — Z961 Presence of intraocular lens: Secondary | ICD-10-CM | POA: Diagnosis not present

## 2016-03-29 ENCOUNTER — Encounter: Payer: Self-pay | Admitting: Cardiovascular Disease

## 2016-03-29 ENCOUNTER — Ambulatory Visit: Payer: Medicare Other | Admitting: Cardiovascular Disease

## 2016-03-30 ENCOUNTER — Encounter: Payer: Self-pay | Admitting: Cardiovascular Disease

## 2016-03-30 ENCOUNTER — Ambulatory Visit (INDEPENDENT_AMBULATORY_CARE_PROVIDER_SITE_OTHER): Payer: Medicare Other | Admitting: Cardiovascular Disease

## 2016-03-30 VITALS — BP 162/64 | HR 63 | Ht 62.0 in | Wt 116.8 lb

## 2016-03-30 DIAGNOSIS — I341 Nonrheumatic mitral (valve) prolapse: Secondary | ICD-10-CM | POA: Diagnosis not present

## 2016-03-30 DIAGNOSIS — I493 Ventricular premature depolarization: Secondary | ICD-10-CM

## 2016-03-30 DIAGNOSIS — I10 Essential (primary) hypertension: Secondary | ICD-10-CM

## 2016-03-30 DIAGNOSIS — I480 Paroxysmal atrial fibrillation: Secondary | ICD-10-CM | POA: Diagnosis not present

## 2016-03-30 NOTE — Patient Instructions (Signed)
Medication Instructions:  Your physician recommends that you continue on your current medications as directed. Please refer to the Current Medication list given to you today.  Labwork: NONE  Testing/Procedures: Your physician has requested that you have en exercise stress myoview. For further information please visit [email or web address]. Please follow instruction sheet, as given.  Follow-Up: Your physician wants you to follow-up in: 5 weeks with Dr. Johnsie Cancel.  Please call our office with your home blood pressure readings.   If you need a refill on your cardiac medications before your next appointment, please call your pharmacy.

## 2016-03-31 ENCOUNTER — Telehealth (HOSPITAL_COMMUNITY): Payer: Self-pay | Admitting: *Deleted

## 2016-03-31 NOTE — Telephone Encounter (Signed)
Patient given detailed instructions per Myocardial Perfusion Study Information Sheet for the test on 03/31/16. Patient notified to arrive 15 minutes early and that it is imperative to arrive on time for appointment to keep from having the test rescheduled.  If you need to cancel or reschedule your appointment, please call the office within 24 hours of your appointment. Failure to do so may result in a cancellation of your appointment, and a $50 no show fee. Patient verbalized understanding.Olney Monier J Rehan Holness, RN  

## 2016-04-01 DIAGNOSIS — M3509 Sicca syndrome with other organ involvement: Secondary | ICD-10-CM | POA: Diagnosis not present

## 2016-04-01 DIAGNOSIS — M25512 Pain in left shoulder: Secondary | ICD-10-CM | POA: Diagnosis not present

## 2016-04-01 DIAGNOSIS — M79641 Pain in right hand: Secondary | ICD-10-CM | POA: Diagnosis not present

## 2016-04-01 DIAGNOSIS — R300 Dysuria: Secondary | ICD-10-CM | POA: Diagnosis not present

## 2016-04-01 DIAGNOSIS — Z79899 Other long term (current) drug therapy: Secondary | ICD-10-CM | POA: Diagnosis not present

## 2016-04-01 DIAGNOSIS — M19241 Secondary osteoarthritis, right hand: Secondary | ICD-10-CM | POA: Diagnosis not present

## 2016-04-01 LAB — CBC AND DIFFERENTIAL
Neutrophils Absolute: 6468 /uL
Platelets: 335 10*3/uL (ref 150–399)
WBC: 9.8 10^3/mL

## 2016-04-01 LAB — BASIC METABOLIC PANEL
BUN: 17 mg/dL (ref 4–21)
CREATININE: 0.9 mg/dL (ref 0.5–1.1)
Glucose: 127 mg/dL
POTASSIUM: 4 mmol/L (ref 3.4–5.3)
SODIUM: 137 mmol/L (ref 137–147)

## 2016-04-01 LAB — HEPATIC FUNCTION PANEL
ALK PHOS: 45 U/L (ref 25–125)
ALT: 11 U/L (ref 7–35)
AST: 15 U/L (ref 13–35)
Bilirubin, Total: 0.5 mg/dL

## 2016-04-02 ENCOUNTER — Telehealth: Payer: Self-pay | Admitting: Cardiovascular Disease

## 2016-04-02 NOTE — Telephone Encounter (Signed)
Overall BP ok labile but ok

## 2016-04-02 NOTE — Telephone Encounter (Signed)
New Message Pt reporting BP readings from 5/2 to 5/5- stated she normally took three readings in 5 minute intervals. Please call back and discuss.   5/2 @ 4pm 168/84, 171/80, 159/82  5/3 @1140am  111/62, 100/59, 101/58  4pm 175/81, 154/80, 150/81  720pm 124/66, 106/69, 98/64  5/4 @ 830am 129/68  4pm 123/70, 124/65, 113/53  850 pm 118/88, 105/66, 111/61  5/5 @ 1015am 155/73, 154/73, 153/76  410pm 179/95, 158/84, 167/86

## 2016-04-05 ENCOUNTER — Ambulatory Visit (HOSPITAL_COMMUNITY): Payer: Medicare Other | Attending: Cardiovascular Disease

## 2016-04-05 DIAGNOSIS — I341 Nonrheumatic mitral (valve) prolapse: Secondary | ICD-10-CM | POA: Diagnosis not present

## 2016-04-05 DIAGNOSIS — R079 Chest pain, unspecified: Secondary | ICD-10-CM | POA: Diagnosis not present

## 2016-04-05 DIAGNOSIS — I493 Ventricular premature depolarization: Secondary | ICD-10-CM | POA: Diagnosis not present

## 2016-04-05 DIAGNOSIS — R002 Palpitations: Secondary | ICD-10-CM | POA: Insufficient documentation

## 2016-04-05 DIAGNOSIS — I480 Paroxysmal atrial fibrillation: Secondary | ICD-10-CM | POA: Insufficient documentation

## 2016-04-05 DIAGNOSIS — I10 Essential (primary) hypertension: Secondary | ICD-10-CM | POA: Insufficient documentation

## 2016-04-05 DIAGNOSIS — R0602 Shortness of breath: Secondary | ICD-10-CM | POA: Diagnosis not present

## 2016-04-05 LAB — MYOCARDIAL PERFUSION IMAGING
CSEPED: 6 min
CSEPEW: 5.3 METS
CSEPPHR: 111 {beats}/min
Exercise duration (sec): 30 s
LV dias vol: 54 mL (ref 46–106)
LVSYSVOL: 12 mL
MPHR: 140 {beats}/min
NUC STRESS TID: 0.89
Percent HR: 79 %
RATE: 0.27
Rest HR: 69 {beats}/min
SDS: 4
SRS: 0
SSS: 4

## 2016-04-05 MED ORDER — REGADENOSON 0.4 MG/5ML IV SOLN
0.4000 mg | Freq: Once | INTRAVENOUS | Status: AC
  Administered 2016-04-05: 0.4 mg via INTRAVENOUS

## 2016-04-05 MED ORDER — TECHNETIUM TC 99M SESTAMIBI GENERIC - CARDIOLITE
32.7000 | Freq: Once | INTRAVENOUS | Status: AC | PRN
  Administered 2016-04-05: 32.7 via INTRAVENOUS

## 2016-04-05 MED ORDER — TECHNETIUM TC 99M SESTAMIBI GENERIC - CARDIOLITE
11.0000 | Freq: Once | INTRAVENOUS | Status: AC | PRN
  Administered 2016-04-05: 11 via INTRAVENOUS

## 2016-04-05 NOTE — Telephone Encounter (Signed)
Informed patient about Dr. Kyla Balzarine message, overall BP okay. Scheduled patient for follow-up appointment. Patient have stress test today.
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
## 2016-05-06 ENCOUNTER — Ambulatory Visit (INDEPENDENT_AMBULATORY_CARE_PROVIDER_SITE_OTHER): Payer: Medicare Other | Admitting: Cardiovascular Disease

## 2016-05-06 ENCOUNTER — Encounter: Payer: Self-pay | Admitting: Cardiovascular Disease

## 2016-05-06 VITALS — BP 160/72 | HR 63 | Ht 62.0 in | Wt 116.8 lb

## 2016-05-06 DIAGNOSIS — I10 Essential (primary) hypertension: Secondary | ICD-10-CM

## 2016-05-06 DIAGNOSIS — I341 Nonrheumatic mitral (valve) prolapse: Secondary | ICD-10-CM | POA: Diagnosis not present

## 2016-05-06 MED ORDER — CARVEDILOL 3.125 MG PO TABS: 3.1250 mg | ORAL_TABLET | Freq: Two times a day (BID) | ORAL | Status: DC

## 2016-05-06 NOTE — Patient Instructions (Signed)

## 2016-05-10 ENCOUNTER — Telehealth: Payer: Self-pay | Admitting: *Deleted

## 2016-05-10 NOTE — Telephone Encounter (Signed)
Pt called requesting dexa order faxed to [facility] last dexa in 2015. Order faxed pt will call to schedule.

## 2016-05-11 ENCOUNTER — Ambulatory Visit (INDEPENDENT_AMBULATORY_CARE_PROVIDER_SITE_OTHER): Payer: Medicare Other | Admitting: Family Medicine

## 2016-05-11 ENCOUNTER — Encounter: Payer: Self-pay | Admitting: Family Medicine

## 2016-05-11 VITALS — BP 148/60 | HR 65 | Temp 98.3°F | Ht 62.0 in | Wt 118.0 lb

## 2016-05-11 DIAGNOSIS — I480 Paroxysmal atrial fibrillation: Secondary | ICD-10-CM

## 2016-05-11 DIAGNOSIS — I10 Essential (primary) hypertension: Secondary | ICD-10-CM

## 2016-05-11 NOTE — Assessment & Plan Note (Signed)
S: poorly controlled on losartan 25mg , coreg 3.25mg  BID in office but controlled with home #s in 130s and 120s. Sparing into 140s.   BP Readings from Last 3 Encounters:  05/11/16 148/60  05/06/16 160/72  03/30/16 162/64  A/P:Continue current meds:  Technically at goal per JNC 8 but home #s mostly below 140 as well fortunately. Wanted to update CBC, CMP but thinks had at rheum so get copy of labs

## 2016-05-11 NOTE — Progress Notes (Signed)
Subjective:  Bailey [name] is a 82 y.o. year old very pleasant female patient who presents for/with See problem oriented charting ROS- No chest pain or shortness of breath. No headache or blurry vision. Occasional palpitations but infrequent.see any ROS included in HPI as well.   Past Medical History-  Patient Active Problem List   Diagnosis Date Noted  . PAROXYSMAL ATRIAL FIBRILLATION 09/05/2008    Priority: High  . HTN (hypertension) 03/13/2015    Priority: Medium  . Anemia 03/13/2015    Priority: Medium  . Raynauds syndrome 10/21/2014    Priority: Medium  . 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 Rheumatoid arthritis ([place]) 08/21/2009    Priority: Medium  . PVC (premature ventricular contraction) 06/04/2009    Priority: Medium  . Mitral valve prolapse 12/25/2008    Priority: Medium  . 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 Edema 06/29/2011    Priority: Low  . Osteopenia 09/05/2008    Priority: Low  . GERD 09/04/2008    Priority: Low  . Irritable bowel syndrome 09/04/2008    Priority: Low  . ALLERGY 09/04/2008    Priority: Low    Medications- reviewed and updated Current Outpatient Prescriptions  Medication Sig Dispense Refill  . acyclovir (ZOVIRAX) 800 MG tablet Take 1 tablet daily for 5 days as needed for outbreak.    Marland Kitchen aspirin 81 MG tablet Take 81 mg by mouth daily.    . carvedilol (COREG) 3.125 MG tablet Take 1 tablet (3.125 mg total) by mouth 2 (two) times daily with a meal. 180 tablet 3  . Cholecalciferol (VITAMIN D) 400 UNITS capsule Take 2 capsules by mouth daily     . cycloSPORINE (RESTASIS) 0.05 % ophthalmic emulsion Place 1 drop into both eyes 2 (two) times daily.     . fish oil-omega-3 fatty acids 1000 MG capsule Take one capsule by mouth  daily    . losartan (COZAAR) 25 MG tablet TAKE 1 TABLET BY MOUTH EVERY DAY 90 tablet 2  . naproxen sodium (ANAPROX) 220 MG tablet Take 220 mg by mouth daily.     Marland Kitchen PREMPRO 0.45-1.5 MG per tablet  Take 1 tablet by mouth daily. 30 tablet 12   No current facility-administered medications for this visit.    Objective: BP 148/60 mmHg  Pulse 65  Temp(Src) 98.3 F (36.8 C) (Oral)  Ht 5\' 2"  (1.575 m)  Wt 118 lb (53.524 kg)  BMI 21.58 kg/m2  SpO2 98% Gen: NAD, resting comfortably CV: RRR no murmurs rubs or gallops Lungs: CTAB no crackles, wheeze, rhonchi Abdomen: soft/nontender/nondistended/normal bowel sounds. Ext: no edema Skin: warm, dry Neuro: grossly normal, moves all extremities, normal gait  Assessment/Plan:  PAROXYSMAL ATRIAL FIBRILLATION S: once again, remains in sinus. No a fib in years. Compliant with coreg 3.125mg  bid, asa 81. Chadsvasc is elevated. Last episode in dentist office 25 years ago.  A/P: continue current medication and cardiology follow up. Remains on coreg for PVCs and palpitations largely controlled  HTN (hypertension) S: poorly controlled on losartan 25mg , coreg 3.25mg  BID in office but controlled with home #s in 130s and 120s. Sparing into 140s.   BP Readings from Last 3 Encounters:  05/11/16 148/60  05/06/16 160/72  03/30/16 162/64  A/P:Continue current meds:  Technically at goal per J[facility] 8 but home #s mostly below 140 as well fortunately. Wanted to update CBC, CMP but thinks had at rheum so get copy of labs   Return in about  6 months (around 11/10/2016) for follow up with me- or sooner if needed. Return precautions advised.   Garret Reddish, MD

## 2016-05-11 NOTE — Patient Instructions (Addendum)
Sign release of information at the check out desk for labs and last  Office visit from rheumatology  Blood pressure goal definitely <150/90 but prefer <140/90. Continue to monitor at home.   I would also like for you to sign up for an annual wellness visit on a Friday with our nurse Manuela Schwartz within 3 months. This is a free benefit under medicare that may help [place] find additional ways to help you.

## 2016-05-11 NOTE — Progress Notes (Signed)
Pre visit review using our clinic review tool, if applicable. No additional management support is needed unless otherwise documented below in the visit note. 

## 2016-05-11 NOTE — Assessment & Plan Note (Signed)
S: once again, remains in sinus. No a fib in years. Compliant with coreg 3.125mg  bid, asa 81. Chadsvasc is elevated. Last episode in dentist office 25 years ago.  A/P: continue current medication and cardiology follow up. Remains on coreg for PVCs and palpitations largely controlled

## 2016-05-14 ENCOUNTER — Encounter: Payer: Self-pay | Admitting: Family Medicine

## 2016-07-05 ENCOUNTER — Other Ambulatory Visit: Payer: Self-pay | Admitting: Gynecology

## 2016-07-12 ENCOUNTER — Encounter: Payer: Self-pay | Admitting: Gynecology

## 2016-07-12 DIAGNOSIS — M8589 Other specified disorders of bone density and structure, multiple sites: Secondary | ICD-10-CM | POA: Diagnosis not present

## 2016-07-12 DIAGNOSIS — Z1231 Encounter for screening mammogram for malignant neoplasm of breast: Secondary | ICD-10-CM | POA: Diagnosis not present

## 2016-07-14 ENCOUNTER — Encounter: Payer: Self-pay | Admitting: Gynecology

## 2016-07-26 ENCOUNTER — Encounter: Payer: Self-pay | Admitting: Gynecology

## 2016-07-26 ENCOUNTER — Ambulatory Visit (INDEPENDENT_AMBULATORY_CARE_PROVIDER_SITE_OTHER): Payer: Medicare Other | Admitting: Gynecology

## 2016-07-26 VITALS — BP 118/70 | Ht 62.0 in | Wt 115.0 lb

## 2016-07-26 DIAGNOSIS — N952 Postmenopausal atrophic vaginitis: Secondary | ICD-10-CM

## 2016-07-26 DIAGNOSIS — M8580 Other specified disorders of bone density and structure, unspecified site: Secondary | ICD-10-CM

## 2016-07-26 DIAGNOSIS — N816 Rectocele: Secondary | ICD-10-CM

## 2016-07-26 DIAGNOSIS — Z79890 Hormone replacement therapy (postmenopausal): Secondary | ICD-10-CM | POA: Diagnosis not present

## 2016-07-26 DIAGNOSIS — Z01419 Encounter for gynecological examination (general) (routine) without abnormal findings: Secondary | ICD-10-CM

## 2016-07-26 DIAGNOSIS — R350 Frequency of micturition: Secondary | ICD-10-CM | POA: Diagnosis not present

## 2016-07-26 MED ORDER — PREMPRO 0.45-1.5 MG PO TABS: 1.0000 | ORAL_TABLET | Freq: Every day | ORAL | 12 refills | Status: DC

## 2016-07-26 NOTE — Patient Instructions (Signed)

## 2016-07-26 NOTE — Progress Notes (Signed)
    Bailey [name] [birth year] [id number]        82 y.o.  [id number]  for breast and pelvic exam  Past medical history,surgical history, problem list, medications, allergies, family history and social history were all reviewed and documented as reviewed in the EPIC chart.  ROS:  Performed with pertinent positives and negatives included in the history, assessment and plan.   Additional significant findings :  Urinary frequency as discussed below   Exam: [name] assistant Vitals:   07/26/16 0924  BP: 118/70  Weight: 115 lb (52.2 kg)  Height: 5\' 2"  (1.575 m)   Body mass index is 21.03 kg/m.  General appearance:  Normal affect, orientation and appearance. Skin: Grossly normal HEENT: Without gross lesions.  No cervical or supraclavicular adenopathy. Thyroid normal.  Lungs:  Clear without wheezing, rales or rhonchi Cardiac: RR, without RMG Abdominal:  Soft, nontender, without masses, guarding, rebound, organomegaly or hernia Breasts:  Examined lying and sitting without masses, retractions, discharge or axillary adenopathy. Pelvic:  Ext/BUS/Vagina with atrophic changes.  Cervix with atrophic changes.  Uterus anteverted, normal size, shape and contour, midline and mobile nontender   Adnexa without masses or tenderness    Anus and perineum normal   Rectovaginal normal sphincter tone without palpated masses or tenderness.    Assessment/Plan:  82 y.o. [id number] female for breast and pelvic exam.   1. Postmenopausal/atrophic genital changes/HRT.  Continues on Prempro 0.45/1.5. Wants to continue. I reviewed the most current 2017 [facility] HRT guidelines. I discussed the risks versus the benefits. She did start early impossible cardiovascular and bone health benefits as well as symptom relief. Risks to include increased risk of breast cancer and thrombosis such as stroke heart attack DVT discussed. Possible increased risk with age particularly with stroke. Options for transdermal versus oral  reviewed. After a lengthy discussion and a shared physician/patient decision we both agree to continue HRT at this point and refill 1 year provided. 2. Urinary frequency. The last several months patient notes during the day she's going more frequent. Is not having nocturia, dysuria, urgency, low back pain, fever or chills. No changes in the color of her urine. Will check baseline urine analysis. If normal then plan monitoring for now. Possible urology referral if increases. 3. Osteopenia. DEXA 06/2016 T score -1.7 stable from prior DEXA. 4. Mild rectocele. Asymptomatic to the patient. Continue monitor with annual exams. 5. Pap smear 2012. No Pap smear done today. No history of significant abnormal Pap smears. Based on current screening guidelines we both agree to stop screening based on age. 6. Colonoscopy 2013. Repeat at their recommended interval. 7. Mammography 06/2016. Continue with annual mammography when due. SBE monthly reviewed. 8. Health maintenance. No routine blood work done as patient reports is done elsewhere. Follow up 1 year, sooner as needed.  15 minutes of my time in excess of her breast and pelvic exam was spent in direct face to face counseling and coordination of care in regards to her problems of HRT and review of new guidelines, urinary frequency.    [name] MD, 9:56 AM 07/26/2016

## 2016-07-27 LAB — URINALYSIS W MICROSCOPIC + REFLEX CULTURE
BACTERIA UA: NONE SEEN [HPF]
BILIRUBIN URINE: NEGATIVE
Casts: NONE SEEN [LPF]
Crystals: NONE SEEN [HPF]
GLUCOSE, UA: NEGATIVE
HGB URINE DIPSTICK: NEGATIVE
Ketones, ur: NEGATIVE
LEUKOCYTES UA: NEGATIVE
Nitrite: NEGATIVE
PROTEIN: NEGATIVE
Specific Gravity, Urine: 1.022 (ref 1.001–1.035)
Yeast: NONE SEEN [HPF]
pH: 5.5 (ref 5.0–8.0)

## 2016-07-28 LAB — URINE CULTURE: Organism ID, Bacteria: NO GROWTH

## 2016-08-13 ENCOUNTER — Ambulatory Visit: Payer: Medicare Other

## 2016-08-31 ENCOUNTER — Ambulatory Visit (INDEPENDENT_AMBULATORY_CARE_PROVIDER_SITE_OTHER): Payer: Medicare Other

## 2016-08-31 DIAGNOSIS — Z23 Encounter for immunization: Secondary | ICD-10-CM

## 2016-09-17 ENCOUNTER — Ambulatory Visit (INDEPENDENT_AMBULATORY_CARE_PROVIDER_SITE_OTHER): Payer: Medicare Other

## 2016-09-17 VITALS — BP 130/60 | HR 75 | Ht 62.0 in | Wt 115.2 lb

## 2016-09-17 DIAGNOSIS — Z0000 Encounter for general adult medical examination without abnormal findings: Secondary | ICD-10-CM | POA: Diagnosis not present

## 2016-09-17 NOTE — Progress Notes (Signed)
I have reviewed and agree with note, evaluation, plan. I agree that following up with PT advisable.   Garret Reddish, MD

## 2016-09-17 NOTE — Patient Instructions (Addendum)
Bailey [name] , Thank you for taking time to come for your Medicare Wellness Visit. I appreciate your ongoing commitment to your health goals. Please review the following plan we discussed and let me know if I can assist you in the future.   Start back exercising;    These are the goals we discussed: Goals    . Exercise 150 minutes per week (moderate activity)          Will get back to exercise !  Helps with balance  Will go back to PT       This is a list of the screening recommended for you and due dates:  Health Maintenance  Topic Date Due  . Tetanus Vaccine  02/18/2020  . Flu Shot  Completed  . DEXA scan (bone density measurement)  Completed  . Shingles Vaccine  Completed  . Pneumonia vaccines  Completed      Fall Prevention in the Home  Falls can cause injuries. They can happen to people of all ages. There are many things you can do to make your home safe and to help prevent falls.  WHAT CAN I DO ON THE OUTSIDE OF MY HOME?  Regularly fix the edges of walkways and driveways and fix any cracks.  Remove anything that might make you trip as you walk through a door, such as a raised step or threshold.  Trim any bushes or trees on the path to your home.  Use bright outdoor lighting.  Clear any walking paths of anything that might make someone trip, such as rocks or tools.  Regularly check to see if handrails are loose or broken. Make sure that both sides of any steps have handrails.  Any raised decks and porches should have guardrails on the edges.  Have any leaves, snow, or ice cleared regularly.  Use sand or salt on walking paths during winter.  Clean up any spills in your garage right away. This includes oil or grease spills. WHAT CAN I DO IN THE BATHROOM?   Use night lights.  Install grab bars by the toilet and in the tub and shower. Do not use towel bars as grab bars.  Use non-skid mats or decals in the tub or shower.  If you need to sit down in the shower,  use a plastic, non-slip stool.  Keep the floor dry. Clean up any water that spills on the floor as soon as it happens.  Remove soap buildup in the tub or shower regularly.  Attach bath mats securely with double-sided non-slip rug tape.  Do not have throw rugs and other things on the floor that can make you trip. WHAT CAN I DO IN THE BEDROOM?  Use night lights.  Make sure that you have a light by your bed that is easy to reach.  Do not use any sheets or blankets that are too big for your bed. They should not hang down onto the floor.  Have a firm chair that has side arms. You can use this for support while you get dressed.  Do not have throw rugs and other things on the floor that can make you trip. WHAT CAN I DO IN THE KITCHEN?  Clean up any spills right away.  Avoid walking on wet floors.  Keep items that you use a lot in easy-to-reach places.  If you need to reach something above you, use a strong step stool that has a grab bar.  Keep electrical cords out of the way.  Do not use floor polish or wax that makes floors slippery. If you must use wax, use non-skid floor wax.  Do not have throw rugs and other things on the floor that can make you trip. WHAT CAN I DO WITH MY STAIRS?  Do not leave any items on the stairs.  Make sure that there are handrails on both sides of the stairs and use them. Fix handrails that are broken or loose. Make sure that handrails are as long as the stairways.  Check any carpeting to make sure that it is firmly attached to the stairs. Fix any carpet that is loose or worn.  Avoid having throw rugs at the top or bottom of the stairs. If you do have throw rugs, attach them to the floor with carpet tape.  Make sure that you have a light switch at the top of the stairs and the bottom of the stairs. If you do not have them, ask someone to add them for you. WHAT ELSE CAN I DO TO HELP PREVENT FALLS?  Wear shoes that:  Do not have high heels.  Have  rubber bottoms.  Are comfortable and fit you well.  Are closed at the toe. Do not wear sandals.  If you use a stepladder:  Make sure that it is fully opened. Do not climb a closed stepladder.  Make sure that both sides of the stepladder are locked into place.  Ask someone to hold it for you, if possible.  Clearly mark and make sure that you can see:  Any grab bars or handrails.  First and last steps.  Where the edge of each step is.  Use tools that help you move around (mobility aids) if they are needed. These include:  Canes.  Walkers.  Scooters.  Crutches.  Turn on the lights when you go into a dark area. Replace any light bulbs as soon as they burn out.  Set up your furniture so you have a clear path. Avoid moving your furniture around.  If any of your floors are uneven, fix them.  If there are any pets around you, be aware of where they are.  Review your medicines with your doctor. Some medicines can make you feel dizzy. This can increase your chance of falling. Ask your doctor what other things that you can do to help prevent falls.   This information is not intended to replace advice given to you by your health care provider. Make sure you discuss any questions you have with your health care provider.   Document Released: 09/11/2009 Document Revised: 04/01/2015 Document Reviewed: 12/20/2014 Elsevier Interactive Patient Education 2016 [place] Maintenance, Female Adopting a healthy lifestyle and getting preventive care can go a long way to promote health and wellness. Talk with your health care provider about what schedule of regular examinations is right for you. This is a good chance for you to check in with your provider about disease prevention and staying healthy. In between checkups, there are plenty of things you can do on your own. Experts have done a lot of research about which lifestyle changes and preventive measures are most likely to  keep you healthy. Ask your health care provider for more information. WEIGHT AND DIET  Eat a healthy diet  Be sure to include plenty of vegetables, fruits, low-fat dairy products, and lean protein.  Do not eat a lot of foods high in solid fats, added sugars, or salt.  Get regular exercise. This is one  of the most important things you can do for your health.  Most adults should exercise for at least 150 minutes each week. The exercise should increase your heart rate and make you sweat (moderate-intensity exercise).  Most adults should also do strengthening exercises at least twice a week. This is in addition to the moderate-intensity exercise.  Maintain a healthy weight  Body mass index (BMI) is a measurement that can be used to identify possible weight problems. It estimates body fat based on height and weight. Your health care provider can help determine your BMI and help you achieve or maintain a healthy weight.  For females 41 years of age and older:   A BMI below 18.5 is considered underweight.  A BMI of 18.5 to 24.9 is normal.  A BMI of 25 to 29.9 is considered overweight.  A BMI of 30 and above is considered obese.  Watch levels of cholesterol and blood lipids  You should start having your blood tested for lipids and cholesterol at 80 years of age, then have this test every 5 years.  You may need to have your cholesterol levels checked more often if:  Your lipid or cholesterol levels are high.  You are older than 80 years of age.  You are at high risk for heart disease.  CANCER SCREENING   Lung Cancer  Lung cancer screening is recommended for adults 80-23 years old who are at high risk for lung cancer because of a history of smoking.  A yearly low-dose CT scan of the lungs is recommended for people who:  Currently smoke.  Have quit within the past 15 years.  Have at least a 30-pack-year history of smoking. A pack year is smoking an average of one pack of  cigarettes a day for 1 year.  Yearly screening should continue until it has been 15 years since you quit.  Yearly screening should stop if you develop a health problem that would prevent you from having lung cancer treatment.  Breast Cancer  Practice breast self-awareness. This means understanding how your breasts normally appear and feel.  It also means doing regular breast self-exams. Let your health care provider know about any changes, no matter how small.  If you are in your 20s or 30s, you should have a clinical breast exam (CBE) by a health care provider every 1-3 years as part of a regular health exam.  If you are 69 or older, have a CBE every year. Also consider having a breast X-ray (mammogram) every year.  If you have a family history of breast cancer, talk to your health care provider about genetic screening.  If you are at high risk for breast cancer, talk to your health care provider about having an MRI and a mammogram every year.  Breast cancer gene (BRCA) assessment is recommended for women who have family members with BRCA-related cancers. BRCA-related cancers include:  Breast.  Ovarian.  Tubal.  Peritoneal cancers.  Results of the assessment will determine the need for genetic counseling and BRCA1 and BRCA2 testing. Cervical Cancer Your health care provider may recommend that you be screened regularly for cancer of the pelvic organs (ovaries, uterus, and vagina). This screening involves a pelvic examination, including checking for microscopic changes to the surface of your cervix (Pap test). You may be encouraged to have this screening done every 3 years, beginning at age 25.  For women ages 69-65, health care providers may recommend pelvic exams and Pap testing every 3 years, or  they may recommend the Pap and pelvic exam, combined with testing for human papilloma virus (HPV), every 5 years. Some types of HPV increase your risk of cervical cancer. Testing for HPV  may also be done on women of any age with unclear Pap test results.  Other health care providers may not recommend any screening for nonpregnant women who are considered low risk for pelvic cancer and who do not have symptoms. Ask your health care provider if a screening pelvic exam is right for you.  If you have had past treatment for cervical cancer or a condition that could lead to cancer, you need Pap tests and screening for cancer for at least 20 years after your treatment. If Pap tests have been discontinued, your risk factors (such as having a new sexual partner) need to be reassessed to determine if screening should resume. Some women have medical problems that increase the chance of getting cervical cancer. In these cases, your health care provider may recommend more frequent screening and Pap tests. Colorectal Cancer  This type of cancer can be detected and often prevented.  Routine colorectal cancer screening usually begins at 80 years of age and continues through 80 years of age.  Your health care provider may recommend screening at an earlier age if you have risk factors for colon cancer.  Your health care provider may also recommend using home test kits to check for hidden blood in the stool.  A small camera at the end of a tube can be used to examine your colon directly (sigmoidoscopy or colonoscopy). This is done to check for the earliest forms of colorectal cancer.  Routine screening usually begins at age 49.  Direct examination of the colon should be repeated every 5-10 years through 80 years of age. However, you may need to be screened more often if early forms of precancerous polyps or small growths are found. Skin Cancer  Check your skin from head to toe regularly.  Tell your health care provider about any new moles or changes in moles, especially if there is a change in a mole's shape or color.  Also tell your health care provider if you have a mole that is larger than  the size of a pencil eraser.  Always use sunscreen. Apply sunscreen liberally and repeatedly throughout the day.  Protect yourself by wearing long sleeves, pants, a wide-brimmed hat, and sunglasses whenever you are outside. HEART DISEASE, DIABETES, AND HIGH BLOOD PRESSURE   High blood pressure causes heart disease and increases the risk of stroke. High blood pressure is more likely to develop in:  People who have blood pressure in the high end of the normal range (130-139/85-89 mm Hg).  People who are overweight or obese.  People who are African American.  If you are 36-1 years of age, have your blood pressure checked every 3-5 years. If you are 29 years of age or older, have your blood pressure checked every year. You should have your blood pressure measured twice--once when you are at a hospital or clinic, and once when you are not at a hospital or clinic. Record the average of the two measurements. To check your blood pressure when you are not at a hospital or clinic, you can use:  An automated blood pressure machine at a pharmacy.  A home blood pressure monitor.  If you are between 14 years and 22 years old, ask your health care provider if you should take aspirin to prevent strokes.  Have regular  diabetes screenings. This involves taking a blood sample to check your fasting blood sugar level.  If you are at a normal weight and have a low risk for diabetes, have this test once every three years after 80 years of age.  If you are overweight and have a high risk for diabetes, consider being tested at a younger age or more often. PREVENTING INFECTION  Hepatitis B  If you have a higher risk for hepatitis B, you should be screened for this virus. You are considered at high risk for hepatitis B if:  You were born in a country where hepatitis B is common. Ask your health care provider which countries are considered high risk.  Your parents were born in a high-risk country, and you  have not been immunized against hepatitis B (hepatitis B vaccine).  You have HIV or AIDS.  You use needles to inject street drugs.  You live with someone who has hepatitis B.  You have had sex with someone who has hepatitis B.  You get hemodialysis treatment.  You take certain medicines for conditions, including cancer, organ transplantation, and autoimmune conditions. Hepatitis C  Blood testing is recommended for:  Everyone born from [birth year] through 1963.  Anyone with known risk factors for hepatitis C. Sexually transmitted infections (STIs)  You should be screened for sexually transmitted infections (STIs) including gonorrhea and chlamydia if:  You are sexually active and are younger than 80 years of age.  You are older than 80 years of age and your health care provider tells you that you are at risk for this type of infection.  Your sexual activity has changed since you were last screened and you are at an increased risk for chlamydia or gonorrhea. Ask your health care provider if you are at risk.  If you do not have HIV, but are at risk, it may be recommended that you take a prescription medicine daily to prevent HIV infection. This is called pre-exposure prophylaxis (PrEP). You are considered at risk if:  You are sexually active and do not regularly use condoms or know the HIV status of your partner(s).  You take drugs by injection.  You are sexually active with a partner who has HIV. Talk with your health care provider about whether you are at high risk of being infected with HIV. If you choose to begin PrEP, you should first be tested for HIV. You should then be tested every 3 months for as long as you are taking PrEP.  PREGNANCY   If you are premenopausal and you may become pregnant, ask your health care provider about preconception counseling.  If you may become pregnant, take 400 to 800 micrograms (mcg) of folic acid every day.  If you want to prevent pregnancy,  talk to your health care provider about birth control (contraception). OSTEOPOROSIS AND MENOPAUSE   Osteoporosis is a disease in which the bones lose minerals and strength with aging. This can result in serious bone fractures. Your risk for osteoporosis can be identified using a bone density scan.  If you are 32 years of age or older, or if you are at risk for osteoporosis and fractures, ask your health care provider if you should be screened.  Ask your health care provider whether you should take a calcium or vitamin D supplement to lower your risk for osteoporosis.  Menopause may have certain physical symptoms and risks.  Hormone replacement therapy may reduce some of these symptoms and risks. Talk to your health  care provider about whether hormone replacement therapy is right for you.  HOME CARE INSTRUCTIONS   Schedule regular health, dental, and eye exams.  Stay current with your immunizations.   Do not use any tobacco products including cigarettes, chewing tobacco, or electronic cigarettes.  If you are pregnant, do not drink alcohol.  If you are breastfeeding, limit how much and how often you drink alcohol.  Limit alcohol intake to no more than 1 drink per day for nonpregnant women. One drink equals 12 ounces of beer, 5 ounces of wine, or 1 ounces of hard liquor.  Do not use street drugs.  Do not share needles.  Ask your health care provider for help if you need support or information about quitting drugs.  Tell your health care provider if you often feel depressed.  Tell your health care provider if you have ever been abused or do not feel safe at home.   This information is not intended to replace advice given to you by your health care provider. Make sure you discuss any questions you have with your health care provider.   Document Released: 05/31/2011 Document Revised: 12/06/2014 Document Reviewed: 10/17/2013 Elsevier Interactive Patient Education [employer].

## 2016-09-17 NOTE — Progress Notes (Signed)
Subjective:   Bailey [name] is a 82 y.o. female who presents for Medicare Annual (Subsequent) preventive examination.  HRA assessment completed during this visit with Ms. Tschida  The Patient was informed that the wellness visit is to identify future health risk and educate and initiate measures that can reduce risk for increased disease through the lifespan.    NO ROS; Medicare Wellness Visit Medical as pertaining to lifestyle changes RA; states she will make an apt with [facility] doctor soon. Had to cancel due to medical issues with spouse Is continuing to fup on SIcca; or Sjogren's with restasis for eyes  Ostoepenia; Vit D 800 u per day; continues estrogen for bone health  HTN/ good today  Hepatitis in 50's (A)  Squamous cell removed  Hx of colon issues; some colitis. Thinks she is better now   Psychosocial (mother has storke; HTN; father had Ulcerative colitis and MI; MGM had Breast and Uterine cancer; MGF had HTN; PGM colon cancer)  Married 1958 4 children; 10 grands One dtr now retired  Retried from Building surveyor; supported 3 children in college Completed degree in [place] and history after nursing; prior to 72th birthday/ went back to school when her mother died;  Hobbies education, travel and piano    Describes health as good, fair or great?  Very good Recently stressed due to ill health of spouse   Tobacco Hx Positive; quit 67; 18 pack years ETOH: NO   Labs checked for lipid management and A1c or fasting BG Labs lipids 2014; chol 180; LDL 65; HDL 108 / glucose was elevated 03/2016 127 / Addressed but stopped exercise due to spouse having surgery   Diet;  Loves bean soup; vegetables; sausage English muffin and honey for breakfast;  Toast and nuts Lunch; 1/2 sandwich; Supper: she cooks;  Ate out when in [place] with recent visit;  ate more  Otherwise eats shirmp, salmon; nothing fried Saute vegetables.   Exercise Has  Not exercised recently  Generally goes to the gym 2 times a  week;  Has been a little tired;    Medications;  Continues with estrogen due to bone density  Reversed her osteopenia / cut the dose back   Counseling:   There are no preventive care reminders to display for this patient.  Female:   Pap-      05/2011 Mammo-      06/2016 - neg Dexa scan-    06/2016    (-1.7)  EKG 04/2016  Colonoscopy 06/2012 - no recall due to age  SAFETY: lives in 2 level home Plans to age in place but spouse has had multiple surgeries Has a walk in shower    Falls Hx: no; was just in [place] in and out of cabs and spouse fell   Long term care plan  Sleep patterns :  Going to be sooner and sleeping later;  Very tired     Cardiac Risk Factors include: advanced age (>37men, >21 women);dyslipidemia;family history of premature cardiovascular disease;hypertension     Objective:     Vitals: BP 130/60   Pulse 75   Ht 5\' 2"  (1.575 m)   Wt 115 lb 4 oz (52.3 kg)   SpO2 96%   BMI 21.08 kg/m   Body mass index is 21.08 kg/m.   Tobacco History  Smoking Status  . Former Smoker  . Packs/day: 2.00  . Years: 9.00  . Types: Cigarettes  . Quit date: 11/30/1963  Smokeless Tobacco  . Never Used  Counseling given: Yes   Past Medical History:  Diagnosis Date  . Allergy    seasonal  . Allergy, unspecified not elsewhere classified   . Anemia, unspecified   . Benign neoplasm of colon   . Cancer [facility])    basal/sqamous/transitional melanoma  . COLONIC POLYPS 09/04/2008   Was told by Gi does not need anymore.    . Diverticulosis of colon (without mention of hemorrhage)   . Dry eye syndrome   . Dysrhythmia    PVCs  . Esophageal reflux   . Hepatitis 1950s   infectious from food  . History of atrial fibrillation   . Hypertension   . Irritable bowel syndrome   . Mitral valve disorders(424.0)   . Osteopenia 06/2016   T score -1.7 FRAX FRAX 11%/2.5% stable from prior DEXA  . Palpitations   . Premature ventricular contractions   . Rheumatoid  arthritis(714.0)   . Sicca syndrome ([place])   . Ulcer ([place]) 1992  . Vaginal delivery 1959, 1960, 1962, 1964   Past Surgical History:  Procedure Laterality Date  . APPENDECTOMY    . CATARACT EXTRACTION    . CHOLECYSTECTOMY    . colon tumor  1969   Benign rectal tumor excised  . DILATATION & CURETTAGE/HYSTEROSCOPY WITH MYOSURE N/A 01/21/2015   Procedure: DILATATION & CURETTAGE/HYSTEROSCOPY WITH MYOSURE;  Surgeon: [name], MD;  Location: [place] [facility];  Service: Gynecology;  Laterality: N/A;  . HIATAL HERNIA REPAIR    . REFRACTIVE SURGERY    . ROTATOR CUFF REPAIR  2009   right by Dr. French Ana  . squamous cell removed  2013   Dr. Sherrye Payor --removed from her right cheek  . TUBAL LIGATION     Family History  Problem Relation Age of Onset  . Stroke Mother   . Hypertension Mother   . Ulcerative colitis Father   . Heart attack Father   . Breast cancer Maternal Grandmother     Age 64's  . Uterine cancer Maternal Grandmother   . Hypertension Maternal Grandfather   . Colon cancer Paternal Grandmother    History  Sexual Activity  . Sexual activity: Not Currently  . Birth control/ protection: Post-menopausal    Comment: 1st intercourse 43 yo-1 partner    Outpatient Encounter Prescriptions as of 09/17/2016  Medication Sig  . acyclovir (ZOVIRAX) 800 MG tablet Take 1 tablet daily for 5 days as needed for outbreak.  . carvedilol (COREG) 3.125 MG tablet Take 1 tablet (3.125 mg total) by mouth 2 (two) times daily with a meal.  . Cholecalciferol (VITAMIN D) 400 UNITS capsule Take 2 capsules by mouth daily   . cycloSPORINE (RESTASIS) 0.05 % ophthalmic emulsion Place 1 drop into both eyes 2 (two) times daily.   . fish oil-omega-3 fatty acids 1000 MG capsule Take one capsule by mouth  daily  . losartan (COZAAR) 25 MG tablet TAKE 1 TABLET BY MOUTH EVERY DAY  . naproxen sodium (ANAPROX) 220 MG tablet Take 220 mg by mouth daily.   Marland Kitchen PREMPRO 0.45-1.5 MG tablet Take 1 tablet by mouth daily.  Marland Kitchen  aspirin 81 MG tablet Take 81 mg by mouth daily.   No facility-administered encounter medications on file as of 09/17/2016.     Activities of Daily Living In your present state of health, do you have any difficulty performing the following activities: 09/17/2016  Hearing? N  Vision? Y  Difficulty concentrating or making decisions? N  Walking or climbing stairs? N  Dressing or bathing? N  Doing errands, shopping? N  Preparing Food and eating ? N  Using the Toilet? N  In the past six months, have you accidently leaked urine? N  Do you have problems with loss of bowel control? N  Managing your Medications? N  Managing your Finances? N  Housekeeping or managing your Housekeeping? N  Some recent data might be hidden    Patient Care Team: Marin Olp, MD as PCP - General (Family Medicine)    Assessment:     Exercise Activities and Dietary recommendations    Goals    . Exercise 150 minutes per week (moderate activity)          Will get back to exercise !  Helps with balance  Will go back to PT      Fall Risk Fall Risk  09/17/2016 11/10/2015 10/21/2014 05/01/2013  Falls in the past year? No No No No   Depression Screen PHQ 2/9 Scores 09/17/2016 11/10/2015 10/21/2014 05/01/2013  PHQ - 2 Score 0 0 0 0     Cognitive Function MMSE - Mini Mental State Exam 09/17/2016  Not completed: (No Data)    Ad8 score 0     Immunization History  Administered Date(s) Administered  . H1N1 11/26/2008  . Influenza Split 08/30/2011, 10/31/2012  . Influenza Whole 09/05/2008, 08/27/2009, 08/20/2010  . Influenza, High Dose Seasonal PF 08/31/2016  . Influenza,inj,Quad PF,36+ Mos 09/13/2013, 10/04/2014, 10/01/2015  . Pneumococcal Conjugate-13 10/21/2014  . Pneumococcal Polysaccharide-23 10/11/2015  . Td 02/17/2010  . Zoster 11/29/2008   Screening Tests Health Maintenance  Topic Date Due  . TETANUS/TDAP  02/18/2020  . INFLUENZA VACCINE  Completed  . DEXA SCAN  Completed  .  ZOSTAVAX  Completed  . PNA vac Low Risk Adult  Completed      Plan:   Will get back to exercise / consider PT as she knows her balance is not as good. No falls. States she does use some very light weights at home   Will try to rest as she is tried from caring for spouse. Denies depression but admits to being anxious due to her care giving role but had not had time to rest herself since spouse started feeling better; Has congregational nurse that she can share with.  Offered counseling but does not feel this is needed but support system is limited and doesn't want to burden her children.  Agrees To get plenty of rest and eat well; exercise and if she still feels anxious to make apt to fup.  Expresses some fears about the future but does not feel overwhelmed.  Also agrees to go back to PT as she was suppose to go but didn't due to spouse surgery.  During the course of the visit the patient was educated and counseled about the following appropriate screening and preventive services:   Vaccines to include Pneumoccal, Influenza, Hepatitis B, Td, Zostavax, HCV  Electrocardiogram; states palpitations are common but not an issue; has had them periodically for a long time.  Cardiovascular Disease/ HTN;   Colorectal cancer screening aged out  Bone density screening/ + and on estrogen; Agrees to try yoga and go back to the [facility]   Diabetes screening/ closely monitors her diet; no fried foods; limited sweets  Glaucoma screening  Mammography/PAP  Nutrition counseling   Patient Instructions (the written plan) was given to the patient.   Wynetta Fines, RN  09/17/2016

## 2016-10-05 ENCOUNTER — Ambulatory Visit: Payer: Medicare Other | Admitting: Rheumatology

## 2016-10-26 DIAGNOSIS — M19071 Primary osteoarthritis, right ankle and foot: Secondary | ICD-10-CM | POA: Insufficient documentation

## 2016-10-26 DIAGNOSIS — M19072 Primary osteoarthritis, left ankle and foot: Secondary | ICD-10-CM

## 2016-10-26 DIAGNOSIS — R2689 Other abnormalities of gait and mobility: Secondary | ICD-10-CM | POA: Insufficient documentation

## 2016-10-26 NOTE — Progress Notes (Signed)
Office Visit Note  Patient: Bailey [name]             Date of Birth: 06-Jan-1933           MRN: [id number]             PCP: Garret Reddish, MD Referring: Marin Olp, MD Visit Date: 10/27/2016 Occupation: @GUAROCC @    Subjective:  No chief complaint on file. f-u on sjogrens/raynauds/  History of Present Illness: Bailey [name] is a 82 y.o. female  Last seen on 04/01/2016. Patient states that her Sjogren's is doing relatively well. She uses Restasis for her eyes and it helps her a lot. Her dry mouth issues are not as bad and so she is decided not to use Biotene because she doesn't feel like it helps as much.  Raynaud's is doing well except when: Whether occurs. A long time ago she ended up having dark and fingers as a result of exposure to cold weather. She's taken precautions against that. She wears gloves on a regular basis especially when she goes to the frozen food section.  Her other complaint today involves her [facility] joint pain. On the last visit in May, she asked Dr. Estanislado Pandy about this and right [facility] brace was given. Patient is getting significant benefit from that. She is requesting a Stuckey brace for the opposite hand now.  She is also wondering about her tendinitis. She has a history of right lateral tendinitis in the past. When she exercises at the gym with weights of 5 pounds, she exacerbates her right elbow joint.  She is getting her bone density done through her OB/GYN's office. Her last bone density was done in June 2017 and it was noted in Epic that her T score is -1.7 by her OB/GYN. The repeat the bone density again in 2 years. She does not need any medications for this according to the patient. They're only giving her hormone meds that are helping. She is getting her vitamin D's and calcium levels checked by them as well.     Activities of Daily Living:  Patient reports morning stiffness for 15 minutes.   Patient Denies nocturnal pain.  Difficulty  dressing/grooming: Denies Difficulty climbing stairs: Denies Difficulty getting out of chair: Denies Difficulty using hands for taps, buttons, cutlery, and/or writing: Reports   No Rheumatology ROS completed.   PMFS History:  Patient Active Problem List   Diagnosis Date Noted  . Balance disorder 10/26/2016  . Primary osteoarthritis of both feet 10/26/2016  . HTN (hypertension) 03/13/2015  . Anemia 03/13/2015  . Raynauds syndrome 10/21/2014  . Recurrent cold sores 10/21/2014  . Edema 06/29/2011  . [place] SYNDROME 08/20/2010  . Primary osteoarthritis of both hands 08/21/2009  . PVC (premature ventricular contraction) 06/04/2009  . Mitral valve prolapse 12/25/2008  . PAROXYSMAL ATRIAL FIBRILLATION 09/05/2008  . Osteopenia 09/05/2008  . GERD 09/04/2008  . Irritable bowel syndrome 09/04/2008  . ALLERGY 09/04/2008    Past Medical History:  Diagnosis Date  . Allergy    seasonal  . Allergy, unspecified not elsewhere classified   . Anemia, unspecified   . Benign neoplasm of colon   . Cancer [facility])    basal/sqamous/transitional melanoma  . COLONIC POLYPS 09/04/2008   Was told by Gi does not need anymore.    . Diverticulosis of colon (without mention of hemorrhage)   . Dry eye syndrome   . Dysrhythmia    PVCs  . Esophageal reflux   . Hepatitis 1950s  infectious from food  . History of atrial fibrillation   . Hypertension   . Irritable bowel syndrome   . Mitral valve disorders(424.0)   . Osteopenia 06/2016   T score -1.7 FRAX FRAX 11%/2.5% stable from prior DEXA  . Palpitations   . Premature ventricular contractions   . Rheumatoid arthritis(714.0)   . Sicca syndrome ([place])   . Ulcer ([place]) 1992  . Vaginal delivery 1959, 1960, 1962, 1964    Family History  Problem Relation Age of Onset  . Stroke Mother   . Hypertension Mother   . Ulcerative colitis Father   . Heart attack Father   . Breast cancer Maternal Grandmother     Age 81's  . Uterine cancer Maternal  Grandmother   . Hypertension Maternal Grandfather   . Colon cancer Paternal Grandmother    Past Surgical History:  Procedure Laterality Date  . APPENDECTOMY    . CATARACT EXTRACTION    . CHOLECYSTECTOMY    . colon tumor  1969   Benign rectal tumor excised  . DILATATION & CURETTAGE/HYSTEROSCOPY WITH MYOSURE N/A 01/21/2015   Procedure: DILATATION & CURETTAGE/HYSTEROSCOPY WITH MYOSURE;  Surgeon: [name], MD;  Location: [place] [facility];  Service: Gynecology;  Laterality: N/A;  . HIATAL HERNIA REPAIR    . REFRACTIVE SURGERY    . ROTATOR CUFF REPAIR  2009   right by Dr. French Ana  . squamous cell removed  2013   Dr. Sherrye Payor --removed from her right cheek  . TUBAL LIGATION     Social History   Social History Narrative   Married 1958. 4 children. 10 grandkids (1 trying to go to medical school). No greatgrandkids.       Retired from nursing (medical and surgical, most recently in cardiology)   Degree in history and english after finished nursing- 2 mo before 72th birthday      Hobbies: education, travel, reading, piano      Objective: Vital Signs: BP (!) 122/50 (BP Location: Left Arm, Patient Position: Sitting, Cuff Size: Large)   Pulse 69   Resp 14   Ht 5\' 2"  (1.575 m)   Wt 117 lb (53.1 kg)   BMI 21.40 kg/m    Physical Exam   Musculoskeletal Exam:  Full range of motion of all joints Grip strength is equal and strong bilaterally Fiber myalgia tender points are all absent  CDAI Exam: No CDAI exam completed.    Investigation: Findings:  Sjogren syndrome with sicca symptoms.  Her symptoms are tolerable with over-the-counter products for right now.  Her Raynaud's is not active.  She has positive rheumatoid factor has had no synovitis on the examination in the past.  She does have osteoarthritis in her hands and feet.  She has some stiffness in her feet, and bilateral CMCs of her hands have been painful.  CBC CMP from 04/02/16 are normal.     Imaging: No results  found.  Speciality Comments: No specialty comments available.    Procedures:  No procedures performed Allergies: Diamox [acetazolamide]; Epinephrine-lidocaine-na metabisulfite [lidocaine-epinephrine]; Erythromycin; Penicillins; Sulfonamide derivatives; and Tape   Assessment / Plan:     Visit Diagnoses: Essential hypertension  SJOGREN'S SYNDROME - with +RF - Plan: CBC with Differential/Platelet, COMPLETE METABOLIC PANEL WITH GFR, Rheumatoid factor, Protein electrophoresis, serum, Urinalysis, Routine w reflex microscopic (not at [facility])  Raynaud's disease  Primary osteoarthritis of both hands  Primary osteoarthritis of both feet  Osteopenia of multiple sites  Balance disorder - has been referred to Physical therapy  Patient was given physical therapy for lower extremity muscle strengthening and fall prevention. She states that she got busy with her husband who had multiple surgeries and she was unable to go but she wants a new physical therapy note for that. I'll be happy to provide that for her. I'll also add that she has shoulder pain elbow pain hand and wrist pain hips knees pain and for the physical therapist evaluated treat.  We'll also give her [place] brace prescription  We'll do labs today. Please to order below for full details  Patient was advised to use over-the-counter medications like Biotene for dry mouth,  or ACT for dry mouth,  or xylimelt patches.  She was also instructed that her rheumatoid factor was positive once before butters there was no synovitis on examination but there is an association with lymphoma. As a result we will monitor rheumatoid factor again today. She does not have any lymphadenopathy on examination.  There is no synovitis on examination. Her Raynaud's is stable. It is not active at this time. However in the past, she did have fingers that turned dark when she was exposed seeing them to cold. We discussed possible strategies to minimize this  risk.     Orders: Orders Placed This Encounter  Procedures  . CBC with Differential/Platelet  . COMPLETE METABOLIC PANEL WITH GFR  . Rheumatoid factor  . Protein electrophoresis, serum  . Urinalysis, Routine w reflex microscopic (not at [facility])   No orders of the defined types were placed in this encounter.   Face-to-face time spent with patient was 40 minutes. 50% of time was spent in counseling and coordination of care.  Follow-Up Instructions: Return in about 6 months (around 04/26/2017) for sjogrens, oa hands & Feet, Raynauds, [place], balance issues.   Eliezer Lofts, PA-C   I examined and evaluated the patient with Eliezer Lofts PA. The plan of care was discussed as noted above.  Bo Merino, MD

## 2016-10-27 ENCOUNTER — Ambulatory Visit (INDEPENDENT_AMBULATORY_CARE_PROVIDER_SITE_OTHER): Payer: Medicare Other | Admitting: Rheumatology

## 2016-10-27 ENCOUNTER — Ambulatory Visit: Payer: Medicare Other | Admitting: Rheumatology

## 2016-10-27 ENCOUNTER — Encounter: Payer: Self-pay | Admitting: Rheumatology

## 2016-10-27 VITALS — BP 122/50 | HR 69 | Resp 14 | Ht 62.0 in | Wt 117.0 lb

## 2016-10-27 DIAGNOSIS — M19072 Primary osteoarthritis, left ankle and foot: Secondary | ICD-10-CM

## 2016-10-27 DIAGNOSIS — M8589 Other specified disorders of bone density and structure, multiple sites: Secondary | ICD-10-CM | POA: Diagnosis not present

## 2016-10-27 DIAGNOSIS — M19042 Primary osteoarthritis, left hand: Secondary | ICD-10-CM

## 2016-10-27 DIAGNOSIS — M3500 Sicca syndrome, unspecified: Secondary | ICD-10-CM

## 2016-10-27 DIAGNOSIS — I7300 Raynaud's syndrome without gangrene: Secondary | ICD-10-CM | POA: Diagnosis not present

## 2016-10-27 DIAGNOSIS — M19071 Primary osteoarthritis, right ankle and foot: Secondary | ICD-10-CM | POA: Diagnosis not present

## 2016-10-27 DIAGNOSIS — I10 Essential (primary) hypertension: Secondary | ICD-10-CM

## 2016-10-27 DIAGNOSIS — M19041 Primary osteoarthritis, right hand: Secondary | ICD-10-CM

## 2016-10-27 DIAGNOSIS — R2689 Other abnormalities of gait and mobility: Secondary | ICD-10-CM | POA: Diagnosis not present

## 2016-10-27 LAB — CBC WITH DIFFERENTIAL/PLATELET
BASOS ABS: 0 {cells}/uL (ref 0–200)
Basophils Relative: 0 %
EOS ABS: 186 {cells}/uL (ref 15–500)
Eosinophils Relative: 2 %
HCT: 35.9 % (ref 35.0–45.0)
Hemoglobin: 11.7 g/dL (ref 11.7–15.5)
LYMPHS ABS: 2232 {cells}/uL (ref 850–3900)
LYMPHS PCT: 24 %
MCH: 30.2 pg (ref 27.0–33.0)
MCHC: 32.6 g/dL (ref 32.0–36.0)
MCV: 92.5 fL (ref 80.0–100.0)
MONO ABS: 1023 {cells}/uL — AB (ref 200–950)
MPV: 10.5 fL (ref 7.5–12.5)
Monocytes Relative: 11 %
NEUTROS PCT: 63 %
Neutro Abs: 5859 cells/uL (ref 1500–7800)
PLATELETS: 283 10*3/uL (ref 140–400)
RBC: 3.88 MIL/uL (ref 3.80–5.10)
RDW: 13.1 % (ref 11.0–15.0)
WBC: 9.3 10*3/uL (ref 3.8–10.8)

## 2016-10-27 LAB — COMPLETE METABOLIC PANEL WITH GFR
ALT: 9 U/L (ref 6–29)
AST: 14 U/L (ref 10–35)
Albumin: 3.7 g/dL (ref 3.6–5.1)
Alkaline Phosphatase: 44 U/L (ref 33–130)
BILIRUBIN TOTAL: 0.4 mg/dL (ref 0.2–1.2)
BUN: 22 mg/dL (ref 7–25)
CO2: 28 mmol/L (ref 20–31)
CREATININE: 0.96 mg/dL — AB (ref 0.60–0.88)
Calcium: 9.5 mg/dL (ref 8.6–10.4)
Chloride: 104 mmol/L (ref 98–110)
GFR, EST NON AFRICAN AMERICAN: 56 mL/min — AB (ref >=60)
GFR, Est African American: 64 mL/min (ref >=60)
GLUCOSE: 121 mg/dL — AB (ref 65–99)
Potassium: 4.4 mmol/L (ref 3.5–5.3)
SODIUM: 138 mmol/L (ref 135–146)
TOTAL PROTEIN: 6.5 g/dL (ref 6.1–8.1)

## 2016-10-28 LAB — URINALYSIS, MICROSCOPIC ONLY
CRYSTALS: NONE SEEN [HPF]
Casts: NONE SEEN [LPF]
Yeast: NONE SEEN [HPF]

## 2016-10-28 LAB — URINALYSIS, ROUTINE W REFLEX MICROSCOPIC
Bilirubin Urine: NEGATIVE
Glucose, UA: NEGATIVE
Hgb urine dipstick: NEGATIVE
Leukocytes, UA: NEGATIVE
NITRITE: NEGATIVE
PH: 5 (ref 5.0–8.0)
Protein, ur: NEGATIVE
SPECIFIC GRAVITY, URINE: 1.027 (ref 1.001–1.035)

## 2016-10-28 LAB — RHEUMATOID FACTOR: Rhuematoid fact SerPl-aCnc: <14 IU/mL (ref <14)

## 2016-10-29 LAB — PROTEIN ELECTROPHORESIS, SERUM
ALPHA-1-GLOBULIN: 0.4 g/dL — AB (ref 0.2–0.3)
ALPHA-2-GLOBULIN: 0.6 g/dL (ref 0.5–0.9)
Albumin ELP: 3.6 g/dL — ABNORMAL LOW (ref 3.8–4.8)
Beta 2: 0.3 g/dL (ref 0.2–0.5)
Beta Globulin: 0.5 g/dL (ref 0.4–0.6)
GAMMA GLOBULIN: 1.1 g/dL (ref 0.8–1.7)
Total Protein, Serum Electrophoresis: 6.5 g/dL (ref 6.1–8.1)

## 2016-10-31 NOTE — Progress Notes (Signed)
Tell patient#1: SPEP is negative#2: Rheumatoid factor negative#3: Urinalysis shows bacteria: Please see PCP for further evaluation and possible treatment if symptomatic for urine infection#4: CMP with GFR is normal except for slight abnormality in kidney function with increasing creatinine and decrease in GFR. Drink adequate amount of water and we will monitor on next lab#5: CBC with differential is within normal limits. Except absolute lymphocytes are slightly elevated which we will monitor.  No change in treatmentSend copy of labs to PCP

## 2016-11-01 ENCOUNTER — Telehealth: Payer: Self-pay | Admitting: Rheumatology

## 2016-11-01 NOTE — Telephone Encounter (Signed)
Patient returned Andrea's phone call.

## 2016-11-02 NOTE — Telephone Encounter (Signed)
Returned patient's call and reviewed lab results with her.

## 2016-11-04 DIAGNOSIS — L821 Other seborrheic keratosis: Secondary | ICD-10-CM | POA: Diagnosis not present

## 2016-11-04 DIAGNOSIS — D1801 Hemangioma of skin and subcutaneous tissue: Secondary | ICD-10-CM | POA: Diagnosis not present

## 2016-11-04 DIAGNOSIS — Z85828 Personal history of other malignant neoplasm of skin: Secondary | ICD-10-CM | POA: Diagnosis not present

## 2016-11-04 DIAGNOSIS — L570 Actinic keratosis: Secondary | ICD-10-CM | POA: Diagnosis not present

## 2016-11-09 ENCOUNTER — Encounter: Payer: Self-pay | Admitting: Family Medicine

## 2016-11-09 ENCOUNTER — Ambulatory Visit (INDEPENDENT_AMBULATORY_CARE_PROVIDER_SITE_OTHER): Payer: Medicare Other | Admitting: Family Medicine

## 2016-11-09 DIAGNOSIS — I480 Paroxysmal atrial fibrillation: Secondary | ICD-10-CM | POA: Diagnosis not present

## 2016-11-09 DIAGNOSIS — I10 Essential (primary) hypertension: Secondary | ICD-10-CM | POA: Diagnosis not present

## 2016-11-09 NOTE — Progress Notes (Signed)
Pre visit review using our clinic review tool, if applicable. No additional management support is needed unless otherwise documented below in the visit note. 

## 2016-11-09 NOTE — Patient Instructions (Signed)
No changes today

## 2016-11-09 NOTE — Progress Notes (Signed)
Subjective:  Bailey [name] is a 82 y.o. year old very pleasant female patient who presents for/with See problem oriented charting ROS- occasional chest pain. No chest pain or shortness of breath. No headache or blurry vision.   Past Medical History-  Patient Active Problem List   Diagnosis Date Noted  . PAROXYSMAL ATRIAL FIBRILLATION 09/05/2008    Priority: High  . HTN (hypertension) 03/13/2015    Priority: Medium  . Anemia 03/13/2015    Priority: Medium  . Raynauds syndrome 10/21/2014    Priority: Medium  . [place] SYNDROME 08/20/2010    Priority: Medium  . Primary osteoarthritis of both hands 08/21/2009    Priority: Medium  . PVC (premature ventricular contraction) 06/04/2009    Priority: Medium  . Mitral valve prolapse 12/25/2008    Priority: Medium  . 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 Edema 06/29/2011    Priority: Low  . Osteopenia 09/05/2008    Priority: Low  . GERD 09/04/2008    Priority: Low  . Irritable bowel syndrome 09/04/2008    Priority: Low  . ALLERGY 09/04/2008    Priority: Low  . Balance disorder 10/26/2016  . Primary osteoarthritis of both feet 10/26/2016    Medications- reviewed and updated Current Outpatient Prescriptions  Medication Sig Dispense Refill  . acyclovir (ZOVIRAX) 800 MG tablet Take 1 tablet daily for 5 days as needed for outbreak.    Marland Kitchen aspirin 81 MG tablet Take 81 mg by mouth daily.    . carvedilol (COREG) 3.125 MG tablet Take 1 tablet (3.125 mg total) by mouth 2 (two) times daily with a meal. 180 tablet 3  . Cholecalciferol (VITAMIN D) 400 UNITS capsule Take 2 capsules by mouth daily     . cycloSPORINE (RESTASIS) 0.05 % ophthalmic emulsion Place 1 drop into both eyes 2 (two) times daily.     . fish oil-omega-3 fatty acids 1000 MG capsule Take one capsule by mouth  daily    . losartan (COZAAR) 25 MG tablet TAKE 1 TABLET BY MOUTH EVERY DAY 90 tablet 2  . naproxen sodium (ANAPROX) 220 MG tablet Take 220 mg by mouth daily.      Marland Kitchen PREMPRO 0.45-1.5 MG tablet Take 1 tablet by mouth daily. 30 tablet 12   No current facility-administered medications for this visit.     Objective: BP 110/78 (BP Location: Left Arm, Patient Position: Sitting, Cuff Size: Normal)   Pulse 78   Temp 98.9 F (37.2 C) (Oral)   Wt 117 lb 9.6 oz (53.3 kg)   SpO2 97%   BMI 21.51 kg/m  Gen: NAD, resting comfortably CV: RRR no murmurs rubs or gallops Lungs: CTAB no crackles, wheeze, rhonchi Ext: no edema under compression stockings Skin: warm, dry Neuro: grossly normal, moves all extremities  Assessment/Plan:  HTN (hypertension) S: controlled on losartan 25, coreg 3.25mg  BID. At home had been well controlled and has not checked regularly as a result of last few checks in office being normal BP Readings from Last 3 Encounters:  11/09/16 110/78  10/27/16 (!) 122/50  09/17/16 130/60  A/P:Continue current meds:  Doing well  PAROXYSMAL ATRIAL FIBRILLATION S: appears in sinus- no a fib in years over 25. On coreg for rate control if needed. On aspirin in case goes back into a fib (also covers PVCs and PACs). Also follows with Dr. Johnsie Cancel. No anticoagulation with no a fib in sometime despite leevated chasvascscore. Occasional palpitations have been determined to be PACs and PVCs due  to mitral valve prolapse.  A/P: continue current meds and cardiology follow up    Return in about 6 months (around 05/10/2017) for follow up- or sooner if needed.  Return precautions advised.  Garret Reddish, MD

## 2016-11-09 NOTE — Assessment & Plan Note (Signed)
S: controlled on losartan 25, coreg 3.25mg  BID. At home had been well controlled and has not checked regularly as a result of last few checks in office being normal BP Readings from Last 3 Encounters:  11/09/16 110/78  10/27/16 (!) 122/50  09/17/16 130/60  A/P:Continue current meds:  Doing well

## 2016-11-09 NOTE — Assessment & Plan Note (Signed)
S: appears in sinus- no a fib in years over 25. On coreg for rate control if needed. On aspirin in case goes back into a fib (also covers PVCs and PACs). Also follows with Dr. Johnsie Cancel. No anticoagulation with no a fib in sometime despite leevated chasvascscore. Occasional palpitations have been determined to be PACs and PVCs due to mitral valve prolapse.  A/P: continue current meds and cardiology follow up

## 2016-11-16 ENCOUNTER — Telehealth: Payer: Self-pay | Admitting: Family Medicine

## 2016-11-16 NOTE — Telephone Encounter (Signed)
Noted  

## 2016-11-16 NOTE — Telephone Encounter (Signed)
[facility] - Client [place] [facility]  Patient Name: Bailey [name]  DOB: December 17, 1932    Initial Comment Caller states she was seen on the 12th for a check up. She's coughing up greenish, gray color. Fatigue.    Nurse Assessment  Nurse: Wynetta Emery, RN, Baker Janus Date/Time Eilene Ghazi Time): 11/16/2016 11:06:01 AM  Confirm and document reason for call. If symptomatic, describe symptoms. ---Webb Silversmith developed aches, pain and fatigue with congestion in head coughing and sinus fever on the 13th every thing is better but cough present and coughing up gray/greenish sputum and fatigue remains  Does the patient have any new or worsening symptoms? ---Yes  Will a triage be completed? ---Yes  Related visit to physician within the last 2 weeks? ---No  Does the PT have any chronic conditions? (i.e. diabetes, asthma, etc.) ---No  Is this a behavioral health or substance abuse call? ---No     Guidelines    Guideline Title Affirmed Question Affirmed Notes  Cough - Acute Productive Cough (all triage questions negative)    Final Disposition User   See Physician within 24 Hours Wynetta Emery, RN, Baker Janus    Comments  NOTE NO APPTS WITH PCP 11/17/2016 300pm arrival 245pm Dr. Carolann Littler constant cough/green/gray sputum   Referrals  REFERRED TO PCP OFFICE   Disagree/Comply: Comply

## 2016-11-17 ENCOUNTER — Ambulatory Visit (INDEPENDENT_AMBULATORY_CARE_PROVIDER_SITE_OTHER): Payer: Medicare Other | Admitting: Family Medicine

## 2016-11-17 VITALS — BP 160/70 | HR 77 | Temp 98.2°F | Ht 62.0 in | Wt 116.3 lb

## 2016-11-17 DIAGNOSIS — J069 Acute upper respiratory infection, unspecified: Secondary | ICD-10-CM

## 2016-11-17 DIAGNOSIS — B9789 Other viral agents as the cause of diseases classified elsewhere: Secondary | ICD-10-CM | POA: Diagnosis not present

## 2016-11-17 MED ORDER — AZITHROMYCIN 250 MG PO TABS: ORAL_TABLET | ORAL | 0 refills | Status: DC

## 2016-11-17 NOTE — Progress Notes (Signed)
Subjective:     Patient ID: Bailey [name], female   DOB: 09/02/1933, 82 y.o.   MRN: [id number]  HPI Patient seen with upper respiratory infection which started about a week ago. Last Wednesday she started with body aches, chills, sore throat. By Thursday she had fever 101. She noticed some laryngitis symptoms and nasal congestion. Denies any nausea, vomiting, or diarrhea. She developed mild cough past couple days productive of green sputum. No dysuria. No further fever since last Thursday. No dyspnea. Increased general malaise.  Past Medical History:  Diagnosis Date  . Allergy    seasonal  . Allergy, unspecified not elsewhere classified   . Anemia, unspecified   . Benign neoplasm of colon   . Cancer [facility])    basal/sqamous/transitional melanoma  . COLONIC POLYPS 09/04/2008   Was told by Gi does not need anymore.    . Diverticulosis of colon (without mention of hemorrhage)   . Dry eye syndrome   . Dysrhythmia    PVCs  . Esophageal reflux   . Hepatitis 1950s   infectious from food  . History of atrial fibrillation   . Hypertension   . Irritable bowel syndrome   . Mitral valve disorders(424.0)   . Osteopenia 06/2016   T score -1.7 FRAX FRAX 11%/2.5% stable from prior DEXA  . Palpitations   . Premature ventricular contractions   . Rheumatoid arthritis(714.0)   . Sicca syndrome ([place])   . Ulcer ([place]) 1992  . Vaginal delivery 1959, 1960, 1962, 1964   Past Surgical History:  Procedure Laterality Date  . APPENDECTOMY    . CATARACT EXTRACTION    . CHOLECYSTECTOMY    . colon tumor  1969   Benign rectal tumor excised  . DILATATION & CURETTAGE/HYSTEROSCOPY WITH MYOSURE N/A 01/21/2015   Procedure: DILATATION & CURETTAGE/HYSTEROSCOPY WITH MYOSURE;  Surgeon: [name], MD;  Location: [facility];  Service: Gynecology;  Laterality: N/A;  . HIATAL HERNIA REPAIR    . REFRACTIVE SURGERY    . ROTATOR CUFF REPAIR  2009   right by Dr. French Ana  . squamous cell removed  2013   Dr. Sherrye Payor  --removed from her right cheek  . TUBAL LIGATION      reports that she quit smoking about 53 years ago. Her smoking use included Cigarettes. She has a 9.00 pack-year smoking history. She has never used smokeless tobacco. She reports that she drinks about 4.2 oz of alcohol per week . She reports that she does not use drugs. family history includes Breast cancer in her maternal grandmother; Colon cancer in her paternal grandmother; Heart attack in her father; Hypertension in her maternal grandfather and mother; Stroke in her mother; Ulcerative colitis in her father; Uterine cancer in her maternal grandmother. Allergies  Allergen Reactions  . Diamox [Acetazolamide] Nausea Only  . Epinephrine-Lidocaine-Na Metabisulfite [Lidocaine-Epinephrine] Other (See Comments)    Atrial fibrillation  . Erythromycin Nausea Only  . Penicillins Other (See Comments)    Unsure of reaction  . Sulfonamide Derivatives Nausea Only and Other (See Comments)    Causes fever, kidney problems  . Tape Rash    Sensitive to skin     Review of Systems  Constitutional: Positive for fatigue. Negative for chills and fever.  HENT: Positive for congestion.   Respiratory: Positive for cough.   Cardiovascular: Negative for chest pain.  Genitourinary: Negative for dysuria.       Objective:   Physical Exam  Constitutional: She appears well-developed and well-nourished.  HENT:  Right  Ear: External ear normal.  Left Ear: External ear normal.  Mouth/Throat: Oropharynx is clear and moist.  Neck: Neck supple.  Cardiovascular: Normal rate and regular rhythm.   Pulmonary/Chest: Effort normal and breath sounds normal. No respiratory distress. She has no wheezes. She has no rales.  Lymphadenopathy:    She has no cervical adenopathy.       Assessment:     Cough probably secondary to recent viral URI    Plan:     -Patient requesting antibiotic. We explained difference viral and bacterial infections. He did agree to  printing out prescription for Zithromax to start if she has any recurrent fever or worsening symptoms. Otherwise try over-the-counter plain Mucinex and increased hydration and observe  Eulas Post MD [facility]

## 2016-11-17 NOTE — Progress Notes (Signed)
Pre visit review using our clinic review tool, if applicable. No additional management support is needed unless otherwise documented below in the visit note. 

## 2016-11-17 NOTE — Patient Instructions (Addendum)
-  Consider over-the-counter plain Mucinex twice daily -Follow-up promptly for any fever, increased shortness of breath, or other concerns -Stay well-hydrated

## 2016-11-24 ENCOUNTER — Other Ambulatory Visit: Payer: Self-pay | Admitting: Cardiovascular Disease

## 2016-12-08 NOTE — Progress Notes (Signed)
Patient ID: ESBEIDY PIGG, female   DOB: 1933/04/24, 83 y.o.   MRN: [id number]  Bailey [name] is seen today in followup for palpitations and PVCs.  Her palpitations have been well under control and she has not had any significant chest pain or syncope. She is happy to continue with her beta blocker. Her tests at the end of 2009 showed no evidence of structural heart disease. She continues to see Dr. Gershon Crane for her eye care. She does have a history of mitral valve prolapse and mild aortic insufficiency. She had some dyspnea at the beach was was unsettling along with some LE edema. No excess salt. Edema has happened in past due to standing and ankle varicosities  Last echo 2012 only trivial MR and mild AR  Asked if it was ok to have a glass of brandy in late afternoon and I said yes   Echo 2012 Mild AR  EF 60-65%    01/21/15 had surgery with general anesthesia.  HTN and increased PVC;s    03/13/15  Cozaar added for BP control  Seems to have more fatigue/ anxiety   She is anxious still about caring for him and out living him   Having some exertional chest pain. No nitro used  F/U Myovue normal  03/2016   The left ventricular ejection fraction is hyperdynamic (>65%).  Nuclear stress EF: 77%.  No T wave inversion was noted during stress.  There was no ST segment deviation noted during stress.  This is a low risk study.  Blood pressure demonstrated a hypertensive response to exercise.  No significant reversible ischemia. LVEF 77% with normal wall motion. Hypertensive response to exercise - unable to achieve target HR, therefore switched to lexiscan. This is a low risk study  She sees rheumatology and has Sjogren and Raynaud's some pain in fingers with cold weather  ROS: Denies fever, malais, weight loss, blurry vision, decreased visual acuity, cough, sputum, SOB, hemoptysis, pleuritic pain, palpitaitons, heartburn, abdominal pain, melena, lower extremity edema, claudication, or rash.  All other  systems reviewed and negative  General: Affect appropriate Healthy:  appears stated age 83: normal Neck supple with no adenopathy JVP normal no bruits no thyromegaly Lungs clear with no wheezing and good diaphragmatic motion Heart:  S1/S2 no murmur, no rub, gallop or click PMI normal Abdomen: benighn, BS positve, no tenderness, no AAA no bruit.  No HSM or HJR Distal pulses intact with no bruits No edema Neuro non-focal Skin warm and dry No muscular weakness   Current Outpatient Prescriptions  Medication Sig Dispense Refill  . acyclovir (ZOVIRAX) 800 MG tablet Take 1 tablet daily for 5 days as needed for outbreak.    Marland Kitchen aspirin 81 MG tablet Take 81 mg by mouth daily.    . carvedilol (COREG) 3.125 MG tablet Take 1 tablet (3.125 mg total) by mouth 2 (two) times daily with a meal. 180 tablet 3  . Cholecalciferol (VITAMIN D) 400 UNITS capsule Take 2 capsules by mouth daily     . cycloSPORINE (RESTASIS) 0.05 % ophthalmic emulsion Place 1 drop into both eyes 2 (two) times daily.     . fish oil-omega-3 fatty acids 1000 MG capsule Take one capsule by mouth  daily    . losartan (COZAAR) 25 MG tablet TAKE 1 TABLET BY MOUTH EVERY DAY 90 tablet 0  . naproxen sodium (ANAPROX) 220 MG tablet Take 220 mg by mouth daily.     Marland Kitchen PREMPRO 0.45-1.5 MG tablet Take 1 tablet by mouth daily.  30 tablet 12   No current facility-administered medications for this visit.     Allergies  Diamox [acetazolamide]; Epinephrine-lidocaine-na metabisulfite [lidocaine-epinephrine]; Erythromycin; Penicillins; Sulfonamide derivatives; and Tape  Electrocardiogram:  09/04/14  SR rate 70  PVC otherwise normal  10/01/15  SR rate 69  Normal ECG  05/06/16 SR rate 62 LAD otherwise normal   Assessment and Plan Palpitations:  PAC;s /PVC;s  Stable on beta blocker  AR:  Mild by echo 2012  F/u echo Pulse pressure not wide and soft murmur HTN:  White coat component home readings fine continue current dose ARB Edema:  Venous  wearing compression stockings improved f/u dermatology  Chest pain:  Improved normal myovue observe  Anxiety:  Seems more easily stressed about things Encouraged her to as primary about SSRI  F/U  6 months   Jenkins Rouge

## 2016-12-09 ENCOUNTER — Encounter: Payer: Self-pay | Admitting: Cardiovascular Disease

## 2016-12-09 ENCOUNTER — Ambulatory Visit (INDEPENDENT_AMBULATORY_CARE_PROVIDER_SITE_OTHER): Payer: Medicare Other | Admitting: Cardiovascular Disease

## 2016-12-09 VITALS — BP 150/56 | HR 78 | Ht 62.0 in | Wt 117.2 lb

## 2016-12-09 DIAGNOSIS — I480 Paroxysmal atrial fibrillation: Secondary | ICD-10-CM

## 2016-12-09 DIAGNOSIS — R011 Cardiac murmur, unspecified: Secondary | ICD-10-CM

## 2016-12-09 DIAGNOSIS — I341 Nonrheumatic mitral (valve) prolapse: Secondary | ICD-10-CM

## 2016-12-09 DIAGNOSIS — I351 Nonrheumatic aortic (valve) insufficiency: Secondary | ICD-10-CM

## 2016-12-09 NOTE — Patient Instructions (Addendum)

## 2016-12-28 ENCOUNTER — Other Ambulatory Visit: Payer: Self-pay

## 2016-12-28 ENCOUNTER — Ambulatory Visit (HOSPITAL_COMMUNITY): Payer: Medicare Other | Attending: Cardiovascular Disease

## 2016-12-28 DIAGNOSIS — I341 Nonrheumatic mitral (valve) prolapse: Secondary | ICD-10-CM | POA: Insufficient documentation

## 2016-12-28 DIAGNOSIS — R011 Cardiac murmur, unspecified: Secondary | ICD-10-CM | POA: Diagnosis not present

## 2016-12-28 DIAGNOSIS — I4891 Unspecified atrial fibrillation: Secondary | ICD-10-CM | POA: Insufficient documentation

## 2016-12-28 DIAGNOSIS — D649 Anemia, unspecified: Secondary | ICD-10-CM | POA: Insufficient documentation

## 2016-12-28 DIAGNOSIS — I082 Rheumatic disorders of both aortic and tricuspid valves: Secondary | ICD-10-CM | POA: Insufficient documentation

## 2016-12-28 DIAGNOSIS — I10 Essential (primary) hypertension: Secondary | ICD-10-CM | POA: Insufficient documentation

## 2016-12-28 DIAGNOSIS — I351 Nonrheumatic aortic (valve) insufficiency: Secondary | ICD-10-CM | POA: Diagnosis not present

## 2016-12-28 LAB — ECHOCARDIOGRAM COMPLETE
AOASC: 32 cm
AVPHT: 381 ms
CHL CUP MV DEC (S): 165
CHL CUP RV SYS PRESS: 39 mmHg
CHL CUP TV REG PEAK VELOCITY: 279 cm/s
E decel time: 165 msec
E/e' ratio: 8.35
FS: 48 % — AB (ref 28–44)
IV/PV OW: 0.88
LA vol index: 16.1 mL/m2
LA vol: 24.4 mL
LADIAMINDEX: 1.84 cm/m2
LASIZE: 28 mm
LAVOLA4C: 22.5 mL
LDCA: 3.14 cm2
LEFT ATRIUM END SYS DIAM: 28 mm
LV E/e' medial: 8.35
LV E/e'average: 8.35
LV PW d: 8.3 mm — AB (ref 0.6–1.1)
LV e' LATERAL: 10.4 cm/s
LVOT SV: 70 mL
LVOT VTI: 22.4 cm
LVOT peak vel: 104 cm/s
LVOTD: 20 mm
MV Peak grad: 3 mmHg
MVPKAVEL: 92.6 m/s
MVPKEVEL: 86.8 m/s
RV LATERAL S' VELOCITY: 11.3 cm/s
TAPSE: 22.2 mm
TDI e' lateral: 10.4
TDI e' medial: 9.46
TRMAXVEL: 279 cm/s

## 2016-12-30 ENCOUNTER — Telehealth: Payer: Self-pay | Admitting: Cardiovascular Disease

## 2016-12-30 NOTE — Telephone Encounter (Signed)
New message    Pt calling returning your call about her results. Please call her back. Thank you.

## 2016-12-30 NOTE — Telephone Encounter (Signed)
Left message for patient call back

## 2016-12-31 NOTE — Telephone Encounter (Signed)
Notes Recorded by Josue Hector, MD on 12/30/2016 at 7:36 AM EST Minimal prolapse and trivial MR EF normal overall good   Patient aware of results.

## 2016-12-31 NOTE — Telephone Encounter (Signed)
Follow up     Returning your call about the test results you called her on Thursday

## 2017-01-05 DIAGNOSIS — Z85828 Personal history of other malignant neoplasm of skin: Secondary | ICD-10-CM | POA: Diagnosis not present

## 2017-01-05 DIAGNOSIS — M720 Palmar fascial fibromatosis [Dupuytren]: Secondary | ICD-10-CM | POA: Diagnosis not present

## 2017-01-05 DIAGNOSIS — D692 Other nonthrombocytopenic purpura: Secondary | ICD-10-CM | POA: Diagnosis not present

## 2017-01-20 DIAGNOSIS — H04123 Dry eye syndrome of bilateral lacrimal glands: Secondary | ICD-10-CM | POA: Diagnosis not present

## 2017-02-01 ENCOUNTER — Other Ambulatory Visit: Payer: Self-pay | Admitting: Cardiovascular Disease

## 2017-04-26 ENCOUNTER — Ambulatory Visit: Payer: Medicare Other | Admitting: Rheumatology

## 2017-04-28 ENCOUNTER — Other Ambulatory Visit: Payer: Self-pay | Admitting: Cardiovascular Disease

## 2017-05-03 ENCOUNTER — Other Ambulatory Visit: Payer: Self-pay | Admitting: Cardiovascular Disease

## 2017-05-10 ENCOUNTER — Ambulatory Visit: Payer: Medicare Other | Admitting: Family Medicine

## 2017-05-12 DIAGNOSIS — Z961 Presence of intraocular lens: Secondary | ICD-10-CM | POA: Diagnosis not present

## 2017-05-12 DIAGNOSIS — M3501 Sicca syndrome with keratoconjunctivitis: Secondary | ICD-10-CM | POA: Insufficient documentation

## 2017-05-12 DIAGNOSIS — H04123 Dry eye syndrome of bilateral lacrimal glands: Secondary | ICD-10-CM | POA: Diagnosis not present

## 2017-05-12 NOTE — Progress Notes (Signed)
Office Visit Note  Patient: Bailey [name]             Date of Birth: [birth year]           MRN: [id number]             PCP: Marin Olp, MD Referring: Marin Olp, MD Visit Date: 05/16/2017 Occupation: @GUAROCC @    Subjective:  Pain in multiple joints.   History of Present Illness: Bailey [name] is a 83 y.o. female with history of Sjogren's syndrome, Raynaud's and osteoarthritis. She states she's been having discomfort in her both hands and both feet. She went to a trip in [place] and walked on cobblestones. At the time she experienced some lower back pain. Which is gradually getting better. She is taking aleve and aspirin for pain. She continues to have some sicca symptoms.    Activities of Daily Living:  Patient reports morning stiffness for 10 minutes.   Patient Denies nocturnal pain.  Difficulty dressing/grooming: Denies Difficulty climbing stairs: Denies Difficulty getting out of chair: Denies Difficulty using hands for taps, buttons, cutlery, and/or writing: Denies   Review of Systems  Constitutional: Positive for fatigue. Negative for night sweats, weight gain, weight loss and weakness.  HENT: Positive for mouth dryness. Negative for mouth sores, trouble swallowing, trouble swallowing and nose dryness.   Eyes: Positive for dryness. Negative for pain, redness and visual disturbance.  Respiratory: Negative for cough, shortness of breath and difficulty breathing.   Cardiovascular: Positive for palpitations. Negative for chest pain, hypertension, irregular heartbeat and swelling in legs/feet.       H/o MVP  Gastrointestinal: Negative for blood in stool, constipation and diarrhea.  Endocrine: Negative for increased urination.  Genitourinary: Negative for vaginal dryness.  Musculoskeletal: Positive for arthralgias, joint pain and morning stiffness. Negative for joint swelling, myalgias, muscle weakness, muscle tenderness and myalgias.  Skin: Negative for color change,  rash, hair loss, skin tightness, ulcers and sensitivity to sunlight.  Allergic/Immunologic: Negative for susceptible to infections.  Neurological: Negative for dizziness, memory loss and night sweats.  Hematological: Negative for swollen glands.  Psychiatric/Behavioral: Negative for depressed mood and sleep disturbance. The patient is nervous/anxious.     PMFS History:  Patient Active Problem List   Diagnosis Date Noted  . Sjogren's syndrome with keratoconjunctivitis sicca ([place]) 05/12/2017  . Balance disorder 10/26/2016  . Primary osteoarthritis of both feet 10/26/2016  . HTN (hypertension) 03/13/2015  . Anemia 03/13/2015  . Raynauds syndrome 10/21/2014  . Recurrent cold sores 10/21/2014  . Edema 06/29/2011  . Primary osteoarthritis of both hands 08/21/2009  . PVC (premature ventricular contraction) 06/04/2009  . Mitral valve prolapse 12/25/2008  . PAROXYSMAL ATRIAL FIBRILLATION 09/05/2008  . Osteopenia 09/05/2008  . GERD 09/04/2008  . Irritable bowel syndrome 09/04/2008  . ALLERGY 09/04/2008    Past Medical History:  Diagnosis Date  . Allergy    seasonal  . Allergy, unspecified not elsewhere classified   . Anemia, unspecified   . Benign neoplasm of colon   . Cancer [facility])    basal/sqamous/transitional melanoma  . COLONIC POLYPS 09/04/2008   Was told by Gi does not need anymore.    . Diverticulosis of colon (without mention of hemorrhage)   . Dry eye syndrome   . Dysrhythmia    PVCs  . Esophageal reflux   . Hepatitis 1950s   infectious from food  . History of atrial fibrillation   . Hypertension   . Irritable bowel syndrome   .  Mitral valve disorders(424.0)   . Osteopenia 06/2016   T score -1.7 FRAX FRAX 11%/2.5% stable from prior DEXA  . Palpitations   . Premature ventricular contractions   . Rheumatoid arthritis(714.0)   . Sicca syndrome ([place])   . Ulcer 1992  . Vaginal delivery 1959, 1960, 1962, 1964    Family History  Problem Relation Age of Onset  .  Stroke Mother   . Hypertension Mother   . Ulcerative colitis Father   . Heart attack Father   . Breast cancer Maternal Grandmother        Age 54's  . Uterine cancer Maternal Grandmother   . Hypertension Maternal Grandfather   . Colon cancer Paternal Grandmother    Past Surgical History:  Procedure Laterality Date  . APPENDECTOMY    . CATARACT EXTRACTION    . CHOLECYSTECTOMY    . colon tumor  1969   Benign rectal tumor excised  . DILATATION & CURETTAGE/HYSTEROSCOPY WITH MYOSURE N/A 01/21/2015   Procedure: DILATATION & CURETTAGE/HYSTEROSCOPY WITH MYOSURE;  Surgeon: [name], MD;  Location: [facility];  Service: Gynecology;  Laterality: N/A;  . HIATAL HERNIA REPAIR    . REFRACTIVE SURGERY    . ROTATOR CUFF REPAIR  2009   right by Dr. French Ana  . squamous cell removed  2013   Dr. Sherrye Payor --removed from her right cheek  . TUBAL LIGATION     Social History   Social History Narrative   Married 1958. 4 children. 10 grandkids (1 trying to go to medical school). No greatgrandkids.       Retired from nursing (medical and surgical, most recently in cardiology)   Degree in history and english after finished nursing- 2 mo before 72th birthday      Hobbies: education, travel, reading, piano      Objective: Vital Signs: BP 124/70   Pulse 70   Resp 14   Ht 5\' 2"  (1.575 m)   Wt 115 lb (52.2 kg)   BMI 21.03 kg/m    Physical Exam  Constitutional: She is oriented to person, place, and time. She appears well-developed and well-nourished.  HENT:  Head: Normocephalic and atraumatic.  Eyes: Conjunctivae and EOM are normal.  Neck: Normal range of motion.  Cardiovascular: Normal rate, regular rhythm, normal heart sounds and intact distal pulses.   Pulmonary/Chest: Effort normal and breath sounds normal.  Abdominal: Soft. Bowel sounds are normal.  Lymphadenopathy:    She has no cervical adenopathy.  Neurological: She is alert and oriented to person, place, and time.  Skin: Skin is  warm and dry. Capillary refill takes less than 2 seconds.  Psychiatric: She has a normal mood and affect. Her behavior is normal.  Nursing note and vitals reviewed.    Musculoskeletal Exam: C-spine and thoracic lumbar spine good range of motion. Shoulder joints although joints wrist joint MCPs PIPs DIPs are good range of motion. No synovitis was noted. Hip joints knee joints ankles MTPs PIPs with good range of motion with no synovitis.  CDAI Exam: No CDAI exam completed.    Investigation: No additional findings. CBC Latest Ref Rng & Units 10/27/2016 04/01/2016 03/13/2015  WBC 3.8 - 10.8 K/uL 9.3 9.8 9.8  Hemoglobin 11.7 - 15.5 g/dL 11.7 - 12.8  Hematocrit 35.0 - 45.0 % 35.9 - 38.0  Platelets 140 - 400 K/uL 283 335 293.0    CMP Latest Ref Rng & Units 10/27/2016 04/01/2016 03/13/2015  Glucose 65 - 99 mg/dL 121(H) - 98  BUN 7 -  25 mg/dL 22 17 21   Creatinine 0.60 - 0.88 mg/dL 0.96(H) 0.9 0.98  Sodium 135 - 146 mmol/L 138 137 137  Potassium 3.5 - 5.3 mmol/L 4.4 4.0 4.3  Chloride 98 - 110 mmol/L 104 - 103  CO2 20 - 31 mmol/L 28 - 27  Calcium 8.6 - 10.4 mg/dL 9.5 - 9.4  Total Protein 6.1 - 8.1 g/dL 6.5 - -  Total Bilirubin 0.2 - 1.2 mg/dL 0.4 - -  Alkaline Phos 33 - 130 U/L 44 45 -  AST 10 - 35 U/L 14 15 -  ALT 6 - 29 U/L 9 11 -   11/17 RF-,SPEP-  Imaging: No results found.  Speciality Comments: No specialty comments available.    Procedures:  No procedures performed Allergies: Diamox [acetazolamide]; Epinephrine-lidocaine-na metabisulfite [lidocaine-epinephrine]; Erythromycin; Penicillins; Sulfonamide derivatives; and Tape   Assessment / Plan:     Visit Diagnoses: Sjogren's syndrome with keratoconjunctivitis sicca ([place]) - -RF,-SPEP 11/17. She continues to have some sicca symptoms. - Plan: CBC with Differential/Platelet, COMPLETE METABOLIC PANEL WITH GFR, Urinalysis, Routine w reflex microscopic  Raynaud's disease without gangrene: Warm clothing and keeping core temperature warm  was discussed. She had no active Raynauds in the office today.  Primary osteoarthritis of both hands: Joint protection and muscle strengthening discussed.  Primary osteoarthritis of both feet: Proper fitting shoes were discussed.  Impingement syndrome of right shoulder: She's minimal discomfort  Patient had some lower back pain which is resolved it appears to be muscular.  Osteopenia of multiple sites: Use of calcium and vitamin D and resistive exercises were discussed.  History of anemia  History of hypertension: Well controlled  History of IBS  History of gastroesophageal reflux (GERD)  Balance disorder  Personal history of atrial fibrillation / PVCs and Mitral valve prolapse     Orders: Orders Placed This Encounter  Procedures  . CBC with Differential/Platelet  . COMPLETE METABOLIC PANEL WITH GFR  . Urinalysis, Routine w reflex microscopic   No orders of the defined types were placed in this encounter.   Face-to-face time spent with patient was 30 minutes. 50% of time was spent in counseling and coordination of care.  Follow-Up Instructions: Return in about 6 months (around 11/15/2017) for Sjogren's.   Bo Merino, MD  Note - This record has been created using Editor, commissioning.  Chart creation errors have been sought, but may not always  have been located. Such creation errors do not reflect on  the standard of medical care.

## 2017-05-13 ENCOUNTER — Ambulatory Visit: Payer: Medicare Other | Admitting: Family Medicine

## 2017-05-16 ENCOUNTER — Ambulatory Visit (INDEPENDENT_AMBULATORY_CARE_PROVIDER_SITE_OTHER): Payer: Medicare Other | Admitting: Rheumatology

## 2017-05-16 ENCOUNTER — Encounter: Payer: Self-pay | Admitting: Rheumatology

## 2017-05-16 VITALS — BP 124/70 | HR 70 | Resp 14 | Ht 62.0 in | Wt 115.0 lb

## 2017-05-16 DIAGNOSIS — M7541 Impingement syndrome of right shoulder: Secondary | ICD-10-CM

## 2017-05-16 DIAGNOSIS — Z8719 Personal history of other diseases of the digestive system: Secondary | ICD-10-CM

## 2017-05-16 DIAGNOSIS — M19072 Primary osteoarthritis, left ankle and foot: Secondary | ICD-10-CM

## 2017-05-16 DIAGNOSIS — G5710 Meralgia paresthetica, unspecified lower limb: Secondary | ICD-10-CM

## 2017-05-16 DIAGNOSIS — M19071 Primary osteoarthritis, right ankle and foot: Secondary | ICD-10-CM | POA: Diagnosis not present

## 2017-05-16 DIAGNOSIS — M19041 Primary osteoarthritis, right hand: Secondary | ICD-10-CM

## 2017-05-16 DIAGNOSIS — M8589 Other specified disorders of bone density and structure, multiple sites: Secondary | ICD-10-CM | POA: Diagnosis not present

## 2017-05-16 DIAGNOSIS — M19042 Primary osteoarthritis, left hand: Secondary | ICD-10-CM

## 2017-05-16 DIAGNOSIS — Z8679 Personal history of other diseases of the circulatory system: Secondary | ICD-10-CM

## 2017-05-16 DIAGNOSIS — R2689 Other abnormalities of gait and mobility: Secondary | ICD-10-CM | POA: Diagnosis not present

## 2017-05-16 DIAGNOSIS — Z862 Personal history of diseases of the blood and blood-forming organs and certain disorders involving the immune mechanism: Secondary | ICD-10-CM | POA: Diagnosis not present

## 2017-05-16 DIAGNOSIS — M3501 Sicca syndrome with keratoconjunctivitis: Secondary | ICD-10-CM

## 2017-05-16 DIAGNOSIS — I7300 Raynaud's syndrome without gangrene: Secondary | ICD-10-CM | POA: Diagnosis not present

## 2017-05-16 LAB — CBC WITH DIFFERENTIAL/PLATELET
BASOS ABS: 0 {cells}/uL (ref 0–200)
Basophils Relative: 0 %
EOS ABS: 312 {cells}/uL (ref 15–500)
EOS PCT: 3 %
HCT: 37.6 % (ref 35.0–45.0)
Hemoglobin: 12.2 g/dL (ref 11.7–15.5)
LYMPHS PCT: 22 %
Lymphs Abs: 2288 cells/uL (ref 850–3900)
MCH: 30.5 pg (ref 27.0–33.0)
MCHC: 32.4 g/dL (ref 32.0–36.0)
MCV: 94 fL (ref 80.0–100.0)
MPV: 10.5 fL (ref 7.5–12.5)
Monocytes Absolute: 1040 cells/uL — ABNORMAL HIGH (ref 200–950)
Monocytes Relative: 10 %
NEUTROS PCT: 65 %
Neutro Abs: 6760 cells/uL (ref 1500–7800)
PLATELETS: 341 10*3/uL (ref 140–400)
RBC: 4 MIL/uL (ref 3.80–5.10)
RDW: 13.1 % (ref 11.0–15.0)
WBC: 10.4 10*3/uL (ref 3.8–10.8)

## 2017-05-16 LAB — COMPLETE METABOLIC PANEL WITH GFR
ALK PHOS: 52 U/L (ref 33–130)
ALT: 11 U/L (ref 6–29)
AST: 16 U/L (ref 10–35)
Albumin: 3.8 g/dL (ref 3.6–5.1)
BILIRUBIN TOTAL: 0.4 mg/dL (ref 0.2–1.2)
BUN: 19 mg/dL (ref 7–25)
CO2: 27 mmol/L (ref 20–31)
Calcium: 9.7 mg/dL (ref 8.6–10.4)
Chloride: 103 mmol/L (ref 98–110)
Creat: 0.97 mg/dL — ABNORMAL HIGH (ref 0.60–0.88)
GFR, EST AFRICAN AMERICAN: 63 mL/min (ref >=60)
GFR, EST NON AFRICAN AMERICAN: 55 mL/min — AB (ref >=60)
GLUCOSE: 89 mg/dL (ref 65–99)
POTASSIUM: 4.7 mmol/L (ref 3.5–5.3)
SODIUM: 139 mmol/L (ref 135–146)
TOTAL PROTEIN: 7.1 g/dL (ref 6.1–8.1)

## 2017-05-17 LAB — URINALYSIS, ROUTINE W REFLEX MICROSCOPIC
Bilirubin Urine: NEGATIVE
Glucose, UA: NEGATIVE
Hgb urine dipstick: NEGATIVE
Ketones, ur: NEGATIVE
LEUKOCYTES UA: NEGATIVE
NITRITE: NEGATIVE
PROTEIN: NEGATIVE
Specific Gravity, Urine: 1.022 (ref 1.001–1.035)
pH: 5.5 (ref 5.0–8.0)

## 2017-05-17 NOTE — Progress Notes (Signed)
Labs are stable.

## 2017-05-19 ENCOUNTER — Encounter: Payer: Self-pay | Admitting: Cardiovascular Disease

## 2017-05-20 ENCOUNTER — Ambulatory Visit (INDEPENDENT_AMBULATORY_CARE_PROVIDER_SITE_OTHER): Payer: Medicare Other | Admitting: Family Medicine

## 2017-05-20 ENCOUNTER — Encounter: Payer: Self-pay | Admitting: Family Medicine

## 2017-05-20 DIAGNOSIS — I10 Essential (primary) hypertension: Secondary | ICD-10-CM

## 2017-05-20 DIAGNOSIS — I480 Paroxysmal atrial fibrillation: Secondary | ICD-10-CM

## 2017-05-20 NOTE — Progress Notes (Signed)
Subjective:  Bailey [name] is a 83 y.o. year old very pleasant female patient who presents for/with See problem oriented charting ROS- No chest pain or shortness of breath. No headache or blurry vision.    Past Medical History-  Patient Active Problem List   Diagnosis Date Noted  . PAROXYSMAL ATRIAL FIBRILLATION 09/05/2008    Priority: High  . HTN (hypertension) 03/13/2015    Priority: Medium  . Anemia 03/13/2015    Priority: Medium  . Raynauds syndrome 10/21/2014    Priority: Medium  . Primary osteoarthritis of both hands 08/21/2009    Priority: Medium  . PVC (premature ventricular contraction) 06/04/2009    Priority: Medium  . Mitral valve prolapse 12/25/2008    Priority: Medium  . 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 Edema 06/29/2011    Priority: Low  . Osteopenia 09/05/2008    Priority: Low  . GERD 09/04/2008    Priority: Low  . Irritable bowel syndrome 09/04/2008    Priority: Low  . ALLERGY 09/04/2008    Priority: Low  . Sjogren's syndrome with keratoconjunctivitis sicca ([place]) 05/12/2017  . Balance disorder 10/26/2016  . Primary osteoarthritis of both feet 10/26/2016    Medications- reviewed and updated Current Outpatient Prescriptions  Medication Sig Dispense Refill  . acyclovir (ZOVIRAX) 800 MG tablet Take 1 tablet daily for 5 days as needed for outbreak.    Marland Kitchen aspirin 81 MG tablet Take 81 mg by mouth daily.    . carvedilol (COREG) 3.125 MG tablet TAKE 1 TABLET (3.125 MG TOTAL) BY MOUTH 2 (TWO) TIMES DAILY WITH A MEAL. 180 tablet 1  . Cholecalciferol (VITAMIN D) 400 UNITS capsule Take 2 capsules by mouth daily     . cycloSPORINE (RESTASIS) 0.05 % ophthalmic emulsion Place 1 drop into both eyes 2 (two) times daily.     . fish oil-omega-3 fatty acids 1000 MG capsule Take one capsule by mouth  daily    . losartan (COZAAR) 25 MG tablet TAKE 1 TABLET BY MOUTH EVERY DAY 90 tablet 1  . naproxen sodium (ANAPROX) 220 MG tablet Take 220 mg by mouth daily.      Marland Kitchen PREMPRO 0.45-1.5 MG tablet Take 1 tablet by mouth daily. 30 tablet 12   No current facility-administered medications for this visit.     Objective: BP 140/70 (BP Location: Left Arm, Patient Position: Sitting, Cuff Size: Normal)   Pulse 68   Temp 98.8 F (37.1 C) (Oral)   Ht 5\' 2"  (1.575 m)   Wt 116 lb (52.6 kg)   SpO2 97%   BMI 21.22 kg/m  Gen: NAD, resting comfortably CV: RRR no murmurs rubs or gallops Lungs: CTAB no crackles, wheeze, rhonchi Abdomen: soft/nontender/nondistended/normal bowel sounds. No rebound or guarding.  Ext: no edema under compression stockings Skin: warm, dry Neuro: grossly normal, moves all extremities  Assessment/Plan:  More anxious as doing more caretaking for her husband.   HTN (hypertension) S: controlled on losartan 25mg , coreg 3.25mg  BID. Mild fatigue on meds- recent labs reassuring through rheumatology. At rheumatology BP was very well controlled ASCVD 10 year risk calculation if age 22-79: outside of age bracket BP Readings from Last 3 Encounters:  05/20/17 140/70  05/16/17 124/70  12/09/16 (!) 150/56  A/P: We discussed blood pressure goal of <140/90 preferred- she is at that goal today but BP seems to run up in this office as well. Continue current meds given excellent control outside this office  PAROXYSMAL ATRIAL FIBRILLATION S: appears  to be in sinus. Coreg for rate controlled if needed (also history of PVCs and PACs- rare palpitations). On aspirin in case goes back into atrial fibrillation. No further anticoagulation unless goes back into atrial fib per Dr. Johnsie Cancel.  A/P: continue current medicines. Hopeful she remains in sinus   Return in about 6 months (around 11/19/2017) for follow up- or sooner if needed.  Return precautions advised.  Garret Reddish, MD

## 2017-05-20 NOTE — Assessment & Plan Note (Signed)
S: appears to be in sinus. Coreg for rate controlled if needed (also history of PVCs and PACs- rare palpitations). On aspirin in case goes back into atrial fibrillation. No further anticoagulation unless goes back into atrial fib per Dr. Johnsie Cancel.  A/P: continue current medicines. Hopeful she remains in sinus

## 2017-05-20 NOTE — Assessment & Plan Note (Signed)
S: controlled on losartan 25mg , coreg 3.25mg  BID. Mild fatigue on meds- recent labs reassuring through rheumatology. At rheumatology BP was very well controlled ASCVD 10 year risk calculation if age 81-79: outside of age bracket BP Readings from Last 3 Encounters:  05/20/17 140/70  05/16/17 124/70  12/09/16 (!) 150/56  A/P: We discussed blood pressure goal of <140/90 preferred- she is at that goal today but BP seems to run up in this office as well. Continue current meds given excellent control outside this office

## 2017-05-20 NOTE — Patient Instructions (Signed)
No changes today   ______________________________________________________________________  Starting October 1st 2018, I will be transferring to our new location: [facility] (corner of [address] [employer]) [place], [place] Phone: [phone]  I would love to have you remain my patient at this new location as long as it remains convenient for you. I am excited about the opportunity to have x-ray and sports medicine in the new building but will really miss the awesome staff and physicians at [facility]. Continue to schedule appointments at [facility] and we will automatically transfer them to the [place] location starting October 1st.

## 2017-05-31 NOTE — Progress Notes (Signed)
Patient ID: Bailey [name], female   DOB: Jun 25, 1933, 83 y.o.   MRN: [id number]  Kabella is seen today in followup for palpitations and PVCs.  Her palpitations have been well under control and she has not had any significant chest pain or syncope. She is happy to continue with her beta blocker. She continues to see Dr. Gershon Crane for her eye care. She does have a history of mitral valve prolapse and moderate aortic insufficiency. Peripheral  Edema has happened in past due to standing and ankle varicosities  Likes to have a glass of brandy at night  01/21/15 had surgery with general anesthesia.  HTN and increased PVC;s    03/13/15  Cozaar added for BP control  Seems to have more fatigue/ anxiety  She sees rheumatology and has Sjogren and Raynaud's some pain in fingers with cold weather  Exertional chest pain. No nitro used  F/U Myovue normal  03/2016   The left ventricular ejection fraction is hyperdynamic (>65%).  Nuclear stress EF: 77%.  No T wave inversion was noted during stress.  There was no ST segment deviation noted during stress.  This is a low risk study.  Blood pressure demonstrated a hypertensive response to exercise.  No significant reversible ischemia. LVEF 77% with normal wall motion. Hypertensive response to exercise - unable to achieve target HR, therefore switched to lexiscan. This is a low risk study  Last echo reviewed 12/28/16 Study Conclusions  - Left ventricle: The cavity size was normal. Systolic function was   normal. The estimated ejection fraction was in the range of 60%   to 65%. Wall motion was normal; there were no regional wall   motion abnormalities. Doppler parameters are consistent with   abnormal left ventricular relaxation (grade 1 diastolic   dysfunction). Doppler parameters are consistent with   indeterminate ventricular filling pressure. - Aortic valve: Transvalvular velocity was within the normal range.   There was no stenosis. There was moderate  regurgitation. - Mitral valve: Very mild prolapse, involving the anterior leaflet. - Right ventricle: The cavity size was normal. Wall thickness was   normal. Systolic function was normal. - Atrial septum: No defect or patent foramen ovale was identified   by color flow Dopper. - Tricuspid valve: There was moderate regurgitation. - Pulmonary arteries: Systolic pressure was within the normal   range. PA peak pressure: 34 mm Hg (S).  ROS: Denies fever, malais, weight loss, blurry vision, decreased visual acuity, cough, sputum, SOB, hemoptysis, pleuritic pain, palpitaitons, heartburn, abdominal pain, melena, lower extremity edema, claudication, or rash.  All other systems reviewed and negative  General: BP 116/74   Pulse 66   Ht 5\' 2"  (1.575 m)   Wt 114 lb 6.4 oz (51.9 kg)   LMP  (LMP Unknown)   BMI 20.92 kg/m  Affect appropriate Healthy:  appears stated age 69: normal Neck supple with no adenopathy JVP normal no bruits no thyromegaly Lungs clear with no wheezing and good diaphragmatic motion Heart:  S1/S2  SEM and AR  murmur, no rub, gallop or click PMI normal Abdomen: benighn, BS positve, no tenderness, no AAA no bruit.  No HSM or HJR Distal pulses intact with no bruits No edema Neuro non-focal Skin warm and dry No muscular weakness    Current Outpatient Prescriptions  Medication Sig Dispense Refill  . acyclovir (ZOVIRAX) 800 MG tablet Take 1 tablet daily for 5 days as needed for outbreak.    Marland Kitchen aspirin 81 MG tablet Take 81 mg by mouth  daily.    . carvedilol (COREG) 3.125 MG tablet TAKE 1 TABLET (3.125 MG TOTAL) BY MOUTH 2 (TWO) TIMES DAILY WITH A MEAL. 180 tablet 1  . Cholecalciferol (VITAMIN D) 400 UNITS capsule Take 2 capsules by mouth daily     . cycloSPORINE (RESTASIS) 0.05 % ophthalmic emulsion Place 1 drop into both eyes 2 (two) times daily.     . fish oil-omega-3 fatty acids 1000 MG capsule Take one capsule by mouth  daily    . losartan (COZAAR) 25 MG tablet  TAKE 1 TABLET BY MOUTH EVERY DAY 90 tablet 1  . naproxen sodium (ANAPROX) 220 MG tablet Take 220 mg by mouth daily.     Marland Kitchen PREMPRO 0.45-1.5 MG tablet Take 1 tablet by mouth daily. 30 tablet 12   No current facility-administered medications for this visit.     Allergies  Diamox [acetazolamide]; Epinephrine-lidocaine-na metabisulfite [lidocaine-epinephrine]; Erythromycin; Penicillins; Sulfonamide derivatives; and Tape  Electrocardiogram:  09/04/14  SR rate 70  PVC otherwise normal  10/01/15  SR rate 69  Normal ECG  05/06/16 SR rate 62 LAD otherwise normal  06/06/17  NSR rate 67 nonspecific inferior ST changes   Assessment and Plan Palpitations:  PAC;s /PVC;s  Stable on beta blocker  AR:   Moderate by echo 11/2016 LV compensated on ARB  HTN:  White coat component home readings fine continue current dose ARB Edema:  Venous wearing compression stockings improved f/u dermatology  Chest pain:  Improved normal myovue observe  Anxiety:  Seems more easily stressed about things Encouraged her to as primary about SSRI Husbands health is biggest stress   F/U  6 months   Jenkins Rouge

## 2017-06-06 ENCOUNTER — Encounter: Payer: Self-pay | Admitting: Cardiovascular Disease

## 2017-06-06 ENCOUNTER — Ambulatory Visit (INDEPENDENT_AMBULATORY_CARE_PROVIDER_SITE_OTHER): Payer: Medicare Other | Admitting: Cardiovascular Disease

## 2017-06-06 VITALS — BP 116/74 | HR 66 | Ht 62.0 in | Wt 114.4 lb

## 2017-06-06 DIAGNOSIS — I351 Nonrheumatic aortic (valve) insufficiency: Secondary | ICD-10-CM

## 2017-06-06 NOTE — Patient Instructions (Addendum)
Medication Instructions:  Your physician recommends that you continue on your current medications as directed. Please refer to the Current Medication list given to you today.  Labwork: NONE  Testing/Procedures: Your physician has requested that you have an echocardiogram in January. Echocardiography is a painless test that uses sound waves to create images of your heart. It provides your doctor with information about the size and shape of your heart and how well your heart's chambers and valves are working. This procedure takes approximately one hour. There are no restrictions for this procedure.  Follow-Up: Your physician wants you to follow-up in: January with Dr. Nishan. You will receive a reminder letter in the mail two months in advance. If you don't receive a letter, please call our office to schedule the follow-up appointment.   If you need a refill on your cardiac medications before your next appointment, please call your pharmacy.    

## 2017-06-20 ENCOUNTER — Other Ambulatory Visit (HOSPITAL_COMMUNITY): Payer: Medicare Other

## 2017-07-14 ENCOUNTER — Ambulatory Visit (INDEPENDENT_AMBULATORY_CARE_PROVIDER_SITE_OTHER): Payer: Medicare Other | Admitting: Family Medicine

## 2017-07-14 ENCOUNTER — Encounter: Payer: Self-pay | Admitting: Family Medicine

## 2017-07-14 VITALS — BP 132/74 | HR 66 | Temp 98.2°F | Ht 62.0 in | Wt 115.4 lb

## 2017-07-14 DIAGNOSIS — K580 Irritable bowel syndrome with diarrhea: Secondary | ICD-10-CM

## 2017-07-14 DIAGNOSIS — R197 Diarrhea, unspecified: Secondary | ICD-10-CM | POA: Diagnosis not present

## 2017-07-14 NOTE — Progress Notes (Signed)
Subjective:  Bailey [name] is a 83 y.o. year old very pleasant female patient who presents for/with See problem oriented charting ROS- no fever or chills. No blood or mucus in the stools. No vomiting   Past Medical History-  Patient Active Problem List   Diagnosis Date Noted  . PAROXYSMAL ATRIAL FIBRILLATION 09/05/2008    Priority: High  . HTN (hypertension) 03/13/2015    Priority: Medium  . Anemia 03/13/2015    Priority: Medium  . Raynauds syndrome 10/21/2014    Priority: Medium  . Primary osteoarthritis of both hands 08/21/2009    Priority: Medium  . PVC (premature ventricular contraction) 06/04/2009    Priority: Medium  . Mitral valve prolapse 12/25/2008    Priority: Medium  . 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 Edema 06/29/2011    Priority: Low  . Osteopenia 09/05/2008    Priority: Low  . GERD 09/04/2008    Priority: Low  . Irritable bowel syndrome 09/04/2008    Priority: Low  . ALLERGY 09/04/2008    Priority: Low  . Sjogren's syndrome with keratoconjunctivitis sicca ([place]) 05/12/2017  . Balance disorder 10/26/2016  . Primary osteoarthritis of both feet 10/26/2016    Medications- reviewed and updated Current Outpatient Prescriptions  Medication Sig Dispense Refill  . acyclovir (ZOVIRAX) 800 MG tablet Take 1 tablet daily for 5 days as needed for outbreak.    Marland Kitchen aspirin 81 MG tablet Take 81 mg by mouth daily.    . carvedilol (COREG) 3.125 MG tablet TAKE 1 TABLET (3.125 MG TOTAL) BY MOUTH 2 (TWO) TIMES DAILY WITH A MEAL. 180 tablet 1  . Cholecalciferol (VITAMIN D) 400 UNITS capsule Take 2 capsules by mouth daily     . cycloSPORINE (RESTASIS) 0.05 % ophthalmic emulsion Place 1 drop into both eyes 2 (two) times daily.     . fish oil-omega-3 fatty acids 1000 MG capsule Take one capsule by mouth  daily    . losartan (COZAAR) 25 MG tablet TAKE 1 TABLET BY MOUTH EVERY DAY 90 tablet 1  . naproxen sodium (ANAPROX) 220 MG tablet Take 220 mg by mouth daily.     Marland Kitchen  PREMPRO 0.45-1.5 MG tablet Take 1 tablet by mouth daily. 30 tablet 12   No current facility-administered medications for this visit.     Objective: BP 132/74 (BP Location: Left Arm, Patient Position: Sitting, Cuff Size: Normal)   Pulse 66   Temp 98.2 F (36.8 C) (Oral)   Ht 5\' 2"  (1.575 m)   Wt 115 lb 6.4 oz (52.3 kg)   LMP  (LMP Unknown)   SpO2 97%   BMI 21.11 kg/m  Gen: NAD, resting comfortably CV: RRR no murmurs rubs or gallops Lungs: CTAB no crackles, wheeze, rhonchi Abdomen: soft/mild tenderness in lower abdomen, otherwise nontender/nondistended/normal bowel sounds. No rebound or guarding.  Ext: no edema Skin: warm, dry Neuro: grossly normal, moves all extremities  Assessment/Plan:  Irritable bowel syndrome with diarrhea  Intermittent diarrhea S: Patient has a long-term history of irritable bowel syndrome. sHe states she has never had the frequency/type of diarrhea episodes that she has had in the last few weeks though. At baseline she has a low appetite in the morning but usually able to eat despite some nausea. With the events below she has not really been able to eat in the morning.  At 5:30 AM she woke up today and was incontinent of stool. She rushed to the bathroom and over the next hour had  at least 3 more bowel movements. Starts out sludgelike and then watery by the end of the bowel movements. After this she is usually able to drink some tea and eats some Ritz crackers to be able to take her medications. She feels fatigued with these episodes  She had a very similar occurrence 2 weeks ago as well as 5 weeks ago. She does usually stool each morning but the volume, looseness, recurrence is atypical for her. She denies fatty meals-states that type of meal bothers her more after her cholecystectomy so she avoids.  She gets very concerned because her dad had ulcerative colitis. She has been cleared from having a repeat colonoscopy with last colonoscopy 07/14/12. Her weight  has been stable  She does not take probiotics other than occasional activitia yogurt such as twice a week maximum A/P: Patient Instructions  Unclear what has caused this shift in your bowel habits with stressors improving but diarrhea worsening. Does not sound infectious.   Lets try a "reset" of colon bacteria by taking activia daily for next 3 weeks. If still having issues- can change to probiotic like florastor or align (can get over the counter- [facility] also has a brand).   The fact there is no blood, significant pain and you are up to date on colonoscopy is very reassuring.   Could try imodium right after first bowel movement if recurs  We also considered  Cbc, cmp, tsh under fatigue/diarrhea. I did not order at time of visit and I called patient back but she ultimately would just prefer to hold off on labs for now and trial above therapies. I do not think we need to get a repeat colonoscopy at this time especially with no red flags.  Return precautions advised.  Garret Reddish, MD

## 2017-07-14 NOTE — Patient Instructions (Addendum)
Unclear what has caused this shift in your bowel habits with stressors improving but diarrhea worsening. Does not sound infectious.   Lets try a "reset" of colon bacteria by taking activia daily for next 3 weeks. If still having issues- can change to probiotic like florastor or align (can get over the counter- [facility] also has a brand).   The fact there is no blood, significant pain and you are up to date on colonoscopy is very reassuring.   Could try imodium right after first bowel movement if recurs

## 2017-07-20 DIAGNOSIS — Z1231 Encounter for screening mammogram for malignant neoplasm of breast: Secondary | ICD-10-CM | POA: Diagnosis not present

## 2017-07-20 DIAGNOSIS — Z803 Family history of malignant neoplasm of breast: Secondary | ICD-10-CM | POA: Diagnosis not present

## 2017-07-27 ENCOUNTER — Encounter: Payer: Medicare Other | Admitting: Gynecology

## 2017-08-01 ENCOUNTER — Other Ambulatory Visit: Payer: Self-pay | Admitting: Gynecology

## 2017-08-22 ENCOUNTER — Other Ambulatory Visit: Payer: Self-pay

## 2017-08-23 ENCOUNTER — Other Ambulatory Visit: Payer: Self-pay

## 2017-08-23 NOTE — Telephone Encounter (Signed)
This encounter not needed. Already handled.

## 2017-08-24 ENCOUNTER — Ambulatory Visit (INDEPENDENT_AMBULATORY_CARE_PROVIDER_SITE_OTHER): Payer: Medicare Other | Admitting: Gynecology

## 2017-08-24 ENCOUNTER — Encounter: Payer: Self-pay | Admitting: Gynecology

## 2017-08-24 VITALS — BP 118/74 | Ht 62.0 in | Wt 117.0 lb

## 2017-08-24 DIAGNOSIS — Z01411 Encounter for gynecological examination (general) (routine) with abnormal findings: Secondary | ICD-10-CM

## 2017-08-24 DIAGNOSIS — M8580 Other specified disorders of bone density and structure, unspecified site: Secondary | ICD-10-CM

## 2017-08-24 DIAGNOSIS — N816 Rectocele: Secondary | ICD-10-CM

## 2017-08-24 DIAGNOSIS — Z79890 Hormone replacement therapy (postmenopausal): Secondary | ICD-10-CM

## 2017-08-24 MED ORDER — CONJ ESTROG-MEDROXYPROGEST ACE 0.45-1.5 MG PO TABS: 1.0000 | ORAL_TABLET | Freq: Every day | ORAL | 12 refills | Status: DC

## 2017-08-24 NOTE — Progress Notes (Signed)
    Bailey [name] 1933/09/03 [id number]        83 y.o.  [id number] for breast and pelvic exam.  Past medical history,surgical history, problem list, medications, allergies, family history and social history were all reviewed and documented as reviewed in the EPIC chart.  ROS:  Performed with pertinent positives and negatives included in the history, assessment and plan.   Additional significant findings :  None   Exam: Bailey [name] assistant Vitals:   08/24/17 1501  BP: 118/74  Weight: 117 lb (53.1 kg)  Height: 5\' 2"  (1.575 m)   Body mass index is 21.4 kg/m.  General appearance:  Normal affect, orientation and appearance. Skin: Grossly normal HEENT: Without gross lesions.  No cervical or supraclavicular adenopathy. Thyroid normal.  Lungs:  Clear without wheezing, rales or rhonchi Cardiac: RR, without RMG Abdominal:  Soft, nontender, without masses, guarding, rebound, organomegaly or hernia Breasts:  Examined lying and sitting without masses, retractions, discharge or axillary adenopathy. Pelvic:  Ext, BUS, Vagina: With atrophic changes. Mild rectocele noted  Cervix: With atrophic changes  Uterus: Anteverted, normal size, shape and contour, midline and mobile nontender   Adnexa: Without masses or tenderness    Anus and perineum: Normal   Rectovaginal: Normal sphincter tone without palpated masses or tenderness.    Assessment/Plan:  83 y.o. [name] female for breast and pelvic exam  1. Postmenopausal/atrophic genital changes/HRT. Continues on Prempro 0.45/1.5. Feeling well and wants to continue. I again reviewed the issues of HRT to include risks particularly with advancing age of stroke heart attack DVT. I also reviewed the breast cancer issue. We discussed the benefits to include symptom relief as well as cardiovascular bone health. At this point patient wants to continue. She's done no bleeding. Refill 1 year provided. She'll call if any issues or bleeding. 2. Osteopenia. DEXA  2017 T score -1.7 stable from prior DEXA. Plan repeat DEXA next year at 2 year interval. 3. Mild rectocele. Patient is asymptomatic. I discussed what a rectocele is and what to look out for. Follow up in one year for reexamination, sooner if develops symptoms. 4. Mammography 07/2017. Continue with annual mammography next year. Breast exam normal today. 5. Pap smear 2012. No Pap smear done today. No history of significant abnormal Pap smears. We both agree to stop screening per current screening guidelines based on age. 6. Colonoscopy 2013. Repeat at their recommended interval. 7. Health maintenance. No routine lab work done as patient does this elsewhere. Follow up 1 year, sooner as needed.   Bailey Auerbach MD, 3:33 PM 08/24/2017

## 2017-08-24 NOTE — Patient Instructions (Signed)
Follow up in one year, sooner as needed. 

## 2017-09-05 ENCOUNTER — Ambulatory Visit (INDEPENDENT_AMBULATORY_CARE_PROVIDER_SITE_OTHER): Payer: Medicare Other

## 2017-09-05 DIAGNOSIS — Z23 Encounter for immunization: Secondary | ICD-10-CM | POA: Diagnosis not present

## 2017-10-12 ENCOUNTER — Telehealth: Payer: Self-pay

## 2017-10-12 NOTE — Telephone Encounter (Signed)
Patient called concerning an earlier appt.

## 2017-10-25 ENCOUNTER — Ambulatory Visit (INDEPENDENT_AMBULATORY_CARE_PROVIDER_SITE_OTHER): Payer: Medicare Other | Admitting: Family Medicine

## 2017-10-25 ENCOUNTER — Encounter: Payer: Self-pay | Admitting: Family Medicine

## 2017-10-25 VITALS — BP 104/70 | HR 56 | Temp 98.1°F | Ht 62.0 in | Wt 116.2 lb

## 2017-10-25 DIAGNOSIS — B9689 Other specified bacterial agents as the cause of diseases classified elsewhere: Secondary | ICD-10-CM | POA: Diagnosis not present

## 2017-10-25 DIAGNOSIS — J329 Chronic sinusitis, unspecified: Secondary | ICD-10-CM | POA: Diagnosis not present

## 2017-10-25 DIAGNOSIS — M25552 Pain in left hip: Secondary | ICD-10-CM

## 2017-10-25 MED ORDER — AZITHROMYCIN 250 MG PO TABS: ORAL_TABLET | ORAL | 0 refills | Status: AC

## 2017-10-25 NOTE — Patient Instructions (Signed)
Sinsusitis (sinus infection) Bacterial based on: Symptoms >10 days  Treatment: -considered steroid: we opted out for now (prednisone) -other symptomatic care with plain mucinex -Antibiotic indicated: yes  Finally, we reviewed reasons to return to care including if symptoms worsen or persist or new concerns arise (particularly fever or shortness of breath)  Meds ordered this encounter  Medications  . azithromycin (ZITHROMAX Z-PAK) 250 MG tablet    Sig: Take 2 tablets (500 mg) on  Day 1,  followed by 1 tablet (250 mg) once daily on Days 2 through 5.    Dispense:  6 each    Refill:  0

## 2017-10-25 NOTE — Progress Notes (Signed)
PCP: Marin Olp, MD  Subjective:  Bailey [name] is a 83 y.o. year old very pleasant female patient who presents with sinusitis symptoms including nasal congestion with thick yellow, green discharge; sinus tenderness- particularly maxillary sinuses -other symptoms include: Sore throat at first but  has gotten better, hoarseness  -day of illness:13 days- only slightly better now -previous treatments: rest, hydration, aleve for sinus pressure -sick contacts/travel/risks: denies flu exposure.   Went to [place] for [employer]- saw whole family but was sick prior to this  ROS-denies fever, SOB, NVD, tooth pain. Has had a few chills  Pertinent Past Medical History-  Patient Active Problem List   Diagnosis Date Noted  . PAROXYSMAL ATRIAL FIBRILLATION 09/05/2008    Priority: High  . HTN (hypertension) 03/13/2015    Priority: Medium  . Anemia 03/13/2015    Priority: Medium  . Raynauds syndrome 10/21/2014    Priority: Medium  . Primary osteoarthritis of both hands 08/21/2009    Priority: Medium  . PVC (premature ventricular contraction) 06/04/2009    Priority: Medium  . Mitral valve prolapse 12/25/2008    Priority: Medium  . 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 Edema 06/29/2011    Priority: Low  . Osteopenia 09/05/2008    Priority: Low  . GERD 09/04/2008    Priority: Low  . Irritable bowel syndrome 09/04/2008    Priority: Low  . ALLERGY 09/04/2008    Priority: Low  . Sjogren's syndrome with keratoconjunctivitis sicca ([place]) 05/12/2017  . Balance disorder 10/26/2016  . Primary osteoarthritis of both feet 10/26/2016    Medications- reviewed  Current Outpatient Medications  Medication Sig Dispense Refill  . acyclovir (ZOVIRAX) 800 MG tablet Take 1 tablet daily for 5 days as needed for outbreak.    Marland Kitchen aspirin 81 MG tablet Take 81 mg by mouth daily.    . carvedilol (COREG) 3.125 MG tablet TAKE 1 TABLET (3.125 MG TOTAL) BY MOUTH 2 (TWO) TIMES DAILY WITH A  MEAL. 180 tablet 1  . Cholecalciferol (VITAMIN D) 400 UNITS capsule Take 2 capsules by mouth daily     . cycloSPORINE (RESTASIS) 0.05 % ophthalmic emulsion Place 1 drop into both eyes 2 (two) times daily.     Marland Kitchen estrogen, conjugated,-medroxyprogesterone (PREMPRO) 0.45-1.5 MG tablet Take 1 tablet by mouth daily. 28 tablet 12  . fish oil-omega-3 fatty acids 1000 MG capsule Take one capsule by mouth  daily    . losartan (COZAAR) 25 MG tablet TAKE 1 TABLET BY MOUTH EVERY DAY 90 tablet 1  . naproxen sodium (ANAPROX) 220 MG tablet Take 220 mg by mouth daily.     Marland Kitchen azithromycin (ZITHROMAX Z-PAK) 250 MG tablet Take 2 tablets (500 mg) on  Day 1,  followed by 1 tablet (250 mg) once daily on Days 2 through 5. 6 each 0   No current facility-administered medications for this visit.     Objective: BP 104/70 (BP Location: Left Arm, Patient Position: Sitting, Cuff Size: Large)   Pulse (!) 56   Temp 98.1 F (36.7 C) (Oral)   Ht 5\' 2"  (1.575 m)   Wt 116 lb 3.2 oz (52.7 kg)   LMP  (LMP Unknown)   SpO2 99%   BMI 21.25 kg/m  Gen: NAD, resting comfortably HEENT: Turbinates erythematous with yellow drainage, TM normal, pharynx mildly erythematous with no tonsilar exudate or edema, bilateral R>L sinus tenderness CV: RRR no murmurs rubs or gallops Lungs: CTAB no crackles, wheeze, rhonchi Ext: no  edema Skin: warm, dry, no rash  Assessment/Plan:  Sinsusitis Bacterial based on: Symptoms >10 days  Treatment: -considered steroid: we opted out for now (prednisone) -other symptomatic care with mucinex -Antibiotic indicated: yes  Finally, we reviewed reasons to return to care including if symptoms worsen or persist or new concerns arise (particularly fever or shortness of breath) - going out of town to [place] leaving in 5 days so hopeful she improves before then- discussed could use urgent care if out of town  Also having some left hip pain S: states worse in winter months. Can radiate into upper legs.  Pushes over SI joint as source of pain. Can be up to moderate. Heat helps. Aleve in Am helps O: pain directly over SI joint, good ROM hip, stable gait A/P: discussed conservative care with heat, icing, prn nsaids. Could give SI joint handout in future or do PT referral if worsening.   Meds ordered this encounter  Medications  . azithromycin (ZITHROMAX Z-PAK) 250 MG tablet    Sig: Take 2 tablets (500 mg) on  Day 1,  followed by 1 tablet (250 mg) once daily on Days 2 through 5.    Dispense:  6 each    Refill:  0    Garret Reddish, MD

## 2017-11-02 NOTE — Progress Notes (Signed)
Office Visit Note  Patient: Bailey [name]             Date of Birth: 1933/09/08           MRN: [id number]             PCP: Marin Olp, MD Referring: Marin Olp, MD Visit Date: 11/15/2017 Occupation: @GUAROCC @    Subjective:  Low back pain    History of Present Illness: Bailey [name] is a 83 y.o. female with a history of Sjogren's syndrome, Raynaud's and osteoarthritis.  Patient states her Sjogren's has been fairly well managed.  She continues to use Biotene products and Restasis eye drops.  She states she follows up with her dentist every 6 months, and she saw her dentist a few weeks ago and denies any dental caries recently.  Patient states her Raynaud's has also improved and she wears gloves frequently. She wears gloves frequently.  She states she uses lotion to prevent drying and ulcerations.  Patient denies any hand or feet pain.  Denies joint swelling.  She states her right shoulder is doing well and good ROM.    She states she has been having left sided SI joint pain that is radiating down her left thigh.  She states the pain is worse after sitting for long periods of time and standing for long periods of time.  She states heat and Aleve help the pain.  She states she has had sciatica in the past, which flared up when she was pregnant.      Activities of Daily Living:  Patient reports morning stiffness for 30 minutes.   Patient Reports nocturnal pain.  Difficulty dressing/grooming: Denies Difficulty climbing stairs: Reports Difficulty getting out of chair: Denies Difficulty using hands for taps, buttons, cutlery, and/or writing: Denies   Review of Systems  Constitutional: Positive for fatigue. Negative for weakness.  HENT: Positive for mouth dryness. Negative for mouth sores and nose dryness.   Eyes: Positive for dryness. Negative for redness.  Respiratory: Negative for cough, hemoptysis, shortness of breath and difficulty breathing.   Cardiovascular: Positive for  hypertension. Negative for chest pain, palpitations and swelling in legs/feet.  Gastrointestinal: Positive for diarrhea. Negative for blood in stool and constipation.  Endocrine: Negative for increased urination.  Genitourinary: Negative for painful urination.  Musculoskeletal: Positive for arthralgias, joint pain and morning stiffness. Negative for joint swelling, myalgias, muscle weakness, muscle tenderness and myalgias.  Skin: Negative for color change, pallor, rash, hair loss, nodules/bumps, redness, skin tightness, ulcers and sensitivity to sunlight.  Neurological: Negative for dizziness, numbness and headaches.  Hematological: Negative for swollen glands.  Psychiatric/Behavioral: Negative for depressed mood and sleep disturbance. The patient is nervous/anxious.     PMFS History:  Patient Active Problem List   Diagnosis Date Noted  . Sjogren's syndrome with keratoconjunctivitis sicca ([place]) 05/12/2017  . Balance disorder 10/26/2016  . Primary osteoarthritis of both feet 10/26/2016  . HTN (hypertension) 03/13/2015  . Anemia 03/13/2015  . Raynauds syndrome 10/21/2014  . Recurrent cold sores 10/21/2014  . Edema 06/29/2011  . Primary osteoarthritis of both hands 08/21/2009  . PVC (premature ventricular contraction) 06/04/2009  . Mitral valve prolapse 12/25/2008  . PAROXYSMAL ATRIAL FIBRILLATION 09/05/2008  . Osteopenia 09/05/2008  . GERD 09/04/2008  . Irritable bowel syndrome 09/04/2008  . ALLERGY 09/04/2008    Past Medical History:  Diagnosis Date  . Allergy    seasonal  . Allergy, unspecified not elsewhere classified   . Anemia, unspecified   .  Benign neoplasm of colon   . Cancer [facility])    basal/sqamous/transitional melanoma  . COLONIC POLYPS 09/04/2008   Was told by Gi does not need anymore.    . Diverticulosis of colon (without mention of hemorrhage)   . Dry eye syndrome   . Dysrhythmia    PVCs  . Esophageal reflux   . Hepatitis 1950s   infectious from food  .  History of atrial fibrillation   . Hypertension   . Irritable bowel syndrome   . Mitral valve disorders(424.0)   . Osteopenia 06/2016   T score -1.7 FRAX FRAX 11%/2.5% stable from prior DEXA  . Palpitations   . Premature ventricular contractions   . Rheumatoid arthritis(714.0)   . Sicca syndrome ([place])   . Ulcer 1992  . Vaginal delivery 1959, 1960, 1962, 1964    Family History  Problem Relation Age of Onset  . Stroke Mother   . Hypertension Mother   . Ulcerative colitis Father   . Heart attack Father   . Breast cancer Maternal Grandmother        Age 20's  . Uterine cancer Maternal Grandmother   . Hypertension Maternal Grandfather   . Colon cancer Paternal Grandmother    Past Surgical History:  Procedure Laterality Date  . APPENDECTOMY    . CATARACT EXTRACTION    . CHOLECYSTECTOMY    . colon tumor  1969   Benign rectal tumor excised  . DILATATION & CURETTAGE/HYSTEROSCOPY WITH MYOSURE N/A 01/21/2015   Procedure: DILATATION & CURETTAGE/HYSTEROSCOPY WITH MYOSURE;  Surgeon: [name], MD;  Location: [facility];  Service: Gynecology;  Laterality: N/A;  . HIATAL HERNIA REPAIR    . REFRACTIVE SURGERY    . ROTATOR CUFF REPAIR  2009   right by Dr. French Ana  . squamous cell removed  2013   Dr. Sherrye Payor --removed from her right cheek  . TUBAL LIGATION     Social History   Social History Narrative   Married 1958. 4 children. 10 grandkids (1 trying to go to medical school). No greatgrandkids.       Retired from nursing (medical and surgical, most recently in cardiology)   Degree in history and english after finished nursing- 2 mo before 72th birthday      Hobbies: education, travel, reading, piano      Objective: Vital Signs: BP (!) 150/67 (BP Location: Left Arm, Patient Position: Sitting, Cuff Size: Normal)   Pulse 60   Resp 17   Ht 5\' 2"  (1.575 m)   Wt 119 lb (54 kg)   LMP  (LMP Unknown)   BMI 21.77 kg/m    Physical Exam  Constitutional: She is oriented to  person, place, and time. She appears well-developed and well-nourished.  HENT:  Head: Normocephalic and atraumatic.  Eyes: Conjunctivae and EOM are normal.  Neck: Normal range of motion.  Cardiovascular: Normal rate, regular rhythm, normal heart sounds and intact distal pulses.  Pulmonary/Chest: Effort normal and breath sounds normal.  Abdominal: Soft. Bowel sounds are normal.  Lymphadenopathy:    She has no cervical adenopathy.  Neurological: She is alert and oriented to person, place, and time.  Skin: Skin is warm and dry. Capillary refill takes less than 2 seconds.  Psychiatric: She has a normal mood and affect. Her behavior is normal.  Nursing note and vitals reviewed.    Musculoskeletal Exam: C-spine, thoracic, and lumbar spine good ROM.  Shoulder joints, elbow joints, and wrist joints good ROM.  MCPs, PIPs and DIPs good  ROM with no synovitis.  Complete fist formation.  Hip joints, knee joints, and ankle joints good ROM with no synovitis. MTPs, PIPs, and DIPs good ROM with no synovitis.  Tenderness over left SI joint.  No midline spinal tenderness.  No trochanteric bursitis.    CDAI Exam: No CDAI exam completed.    Investigation: No additional findings. CBC Latest Ref Rng & Units 05/16/2017 10/27/2016 04/01/2016  WBC 3.8 - 10.8 K/uL 10.4 9.3 9.8  Hemoglobin 11.7 - 15.5 g/dL 12.2 11.7 -  Hematocrit 35.0 - 45.0 % 37.6 35.9 -  Platelets 140 - 400 K/uL 341 283 335   CMP Latest Ref Rng & Units 05/16/2017 10/27/2016 04/01/2016  Glucose 65 - 99 mg/dL 89 121(H) -  BUN 7 - 25 mg/dL 19 22 17   Creatinine 0.60 - 0.88 mg/dL 0.97(H) 0.96(H) 0.9  Sodium 135 - 146 mmol/L 139 138 137  Potassium 3.5 - 5.3 mmol/L 4.7 4.4 4.0  Chloride 98 - 110 mmol/L 103 104 -  CO2 20 - 31 mmol/L 27 28 -  Calcium 8.6 - 10.4 mg/dL 9.7 9.5 -  Total Protein 6.1 - 8.1 g/dL 7.1 6.5 -  Total Bilirubin 0.2 - 1.2 mg/dL 0.4 0.4 -  Alkaline Phos 33 - 130 U/L 52 44 45  AST 10 - 35 U/L 16 14 15   ALT 6 - 29 U/L 11 9 11      Imaging: No results found.  Speciality Comments: No specialty comments available.    Procedures:  No procedures performed Allergies: Diamox [acetazolamide]; Epinephrine-lidocaine-na metabisulfite [lidocaine-epinephrine]; Erythromycin; Penicillins; Sulfonamide derivatives; and Tape   Assessment / Plan:     Visit Diagnoses: Sjogren's syndrome with keratoconjunctivitis sicca ([place]) -  -RF,-SPEP 11/17. She reports improved sicca symptoms.  She is using her biotene products daily.  She also continues to use Restasis.  She follows up with her dentist every 6 months and reports no recent dental caries.    Raynaud's disease without gangrene: Symptoms have improved since last visit.  She continues to use gloves and keeps her hands moisturized.    Primary osteoarthritis of both hands: No synovitis on exam.  Joint protection and muscle strengthening were discussed.    Primary osteoarthritis of both feet: No discomfort or synovitis.  Discussed the importance of proper fitting shoes.    Impingement syndrome of right shoulder: Doing very well.  Great ROM with no discomfort.    Chronic left SI joint pain - Patient has tenderness over left SI joint and radiation of pain down her left thigh.  Patient declined a cortisone injection at this time.  A referral was placed for her to go to [facility] physical therapy for evaluation and treatment. Plan: Ambulatory referral to Physical Therapy   Other medical conditions are listed as follows:   Osteopenia of multiple sites  History of hypertension  History of anemia  History of IBS: Patient has recently started taking Probiotics daily, which has helped her diarrhea significantly.    History of gastroesophageal reflux (GERD)  History of atrial fibrillation - PVCs and Mitral valve prolapse    Balance disorder   Orders: Orders Placed This Encounter  Procedures  . Ambulatory referral to Physical Therapy   No orders of the defined  types were placed in this encounter.   Face-to-face time spent with patient was 30  Minutes. Greater than 50% of time was spent in counseling and coordination of care.  Follow-Up Instructions: Return in about 6 months (around 05/16/2018) for Sjogren's syndrome, Osteoarthritis.  Note - This record has been created using [employer].  Chart creation errors have been sought, but may not always  have been located. Such creation errors do not reflect on  the standard of medical care.

## 2017-11-04 ENCOUNTER — Other Ambulatory Visit: Payer: Self-pay | Admitting: Cardiovascular Disease

## 2017-11-05 ENCOUNTER — Other Ambulatory Visit: Payer: Self-pay | Admitting: Cardiovascular Disease

## 2017-11-10 DIAGNOSIS — Z85828 Personal history of other malignant neoplasm of skin: Secondary | ICD-10-CM | POA: Diagnosis not present

## 2017-11-10 DIAGNOSIS — L814 Other melanin hyperpigmentation: Secondary | ICD-10-CM | POA: Diagnosis not present

## 2017-11-10 DIAGNOSIS — D2261 Melanocytic nevi of right upper limb, including shoulder: Secondary | ICD-10-CM | POA: Diagnosis not present

## 2017-11-10 DIAGNOSIS — L821 Other seborrheic keratosis: Secondary | ICD-10-CM | POA: Diagnosis not present

## 2017-11-10 DIAGNOSIS — D225 Melanocytic nevi of trunk: Secondary | ICD-10-CM | POA: Diagnosis not present

## 2017-11-10 DIAGNOSIS — B0089 Other herpesviral infection: Secondary | ICD-10-CM | POA: Diagnosis not present

## 2017-11-10 DIAGNOSIS — L570 Actinic keratosis: Secondary | ICD-10-CM | POA: Diagnosis not present

## 2017-11-10 DIAGNOSIS — D2262 Melanocytic nevi of left upper limb, including shoulder: Secondary | ICD-10-CM | POA: Diagnosis not present

## 2017-11-10 DIAGNOSIS — D2271 Melanocytic nevi of right lower limb, including hip: Secondary | ICD-10-CM | POA: Diagnosis not present

## 2017-11-10 DIAGNOSIS — D1801 Hemangioma of skin and subcutaneous tissue: Secondary | ICD-10-CM | POA: Diagnosis not present

## 2017-11-15 ENCOUNTER — Encounter: Payer: Self-pay | Admitting: Rheumatology

## 2017-11-15 ENCOUNTER — Ambulatory Visit (INDEPENDENT_AMBULATORY_CARE_PROVIDER_SITE_OTHER): Payer: Medicare Other | Admitting: Rheumatology

## 2017-11-15 VITALS — BP 150/67 | HR 60 | Resp 17 | Ht 62.0 in | Wt 119.0 lb

## 2017-11-15 DIAGNOSIS — M8589 Other specified disorders of bone density and structure, multiple sites: Secondary | ICD-10-CM | POA: Diagnosis not present

## 2017-11-15 DIAGNOSIS — I7300 Raynaud's syndrome without gangrene: Secondary | ICD-10-CM | POA: Diagnosis not present

## 2017-11-15 DIAGNOSIS — Z8719 Personal history of other diseases of the digestive system: Secondary | ICD-10-CM

## 2017-11-15 DIAGNOSIS — M19041 Primary osteoarthritis, right hand: Secondary | ICD-10-CM | POA: Diagnosis not present

## 2017-11-15 DIAGNOSIS — Z862 Personal history of diseases of the blood and blood-forming organs and certain disorders involving the immune mechanism: Secondary | ICD-10-CM | POA: Diagnosis not present

## 2017-11-15 DIAGNOSIS — M533 Sacrococcygeal disorders, not elsewhere classified: Secondary | ICD-10-CM | POA: Diagnosis not present

## 2017-11-15 DIAGNOSIS — M7541 Impingement syndrome of right shoulder: Secondary | ICD-10-CM | POA: Diagnosis not present

## 2017-11-15 DIAGNOSIS — R2689 Other abnormalities of gait and mobility: Secondary | ICD-10-CM | POA: Diagnosis not present

## 2017-11-15 DIAGNOSIS — M3501 Sicca syndrome with keratoconjunctivitis: Secondary | ICD-10-CM

## 2017-11-15 DIAGNOSIS — Z8679 Personal history of other diseases of the circulatory system: Secondary | ICD-10-CM

## 2017-11-15 DIAGNOSIS — M19042 Primary osteoarthritis, left hand: Secondary | ICD-10-CM | POA: Diagnosis not present

## 2017-11-15 DIAGNOSIS — G8929 Other chronic pain: Secondary | ICD-10-CM

## 2017-11-15 DIAGNOSIS — M19071 Primary osteoarthritis, right ankle and foot: Secondary | ICD-10-CM | POA: Diagnosis not present

## 2017-11-15 DIAGNOSIS — M19072 Primary osteoarthritis, left ankle and foot: Secondary | ICD-10-CM

## 2017-11-18 ENCOUNTER — Ambulatory Visit: Payer: Medicare Other | Admitting: Family Medicine

## 2017-11-23 DIAGNOSIS — H04129 Dry eye syndrome of unspecified lacrimal gland: Secondary | ICD-10-CM | POA: Insufficient documentation

## 2017-11-23 DIAGNOSIS — I499 Cardiac arrhythmia, unspecified: Secondary | ICD-10-CM | POA: Insufficient documentation

## 2017-11-23 DIAGNOSIS — Z85828 Personal history of other malignant neoplasm of skin: Secondary | ICD-10-CM | POA: Insufficient documentation

## 2017-11-23 DIAGNOSIS — K759 Inflammatory liver disease, unspecified: Secondary | ICD-10-CM | POA: Insufficient documentation

## 2017-11-23 DIAGNOSIS — Z8679 Personal history of other diseases of the circulatory system: Secondary | ICD-10-CM | POA: Insufficient documentation

## 2017-11-23 DIAGNOSIS — T7840XA Allergy, unspecified, initial encounter: Secondary | ICD-10-CM | POA: Insufficient documentation

## 2017-11-23 DIAGNOSIS — M3500 Sicca syndrome, unspecified: Secondary | ICD-10-CM | POA: Insufficient documentation

## 2017-11-23 DIAGNOSIS — I493 Ventricular premature depolarization: Secondary | ICD-10-CM | POA: Insufficient documentation

## 2017-11-23 DIAGNOSIS — R002 Palpitations: Secondary | ICD-10-CM | POA: Insufficient documentation

## 2017-11-23 DIAGNOSIS — I10 Essential (primary) hypertension: Secondary | ICD-10-CM | POA: Insufficient documentation

## 2017-11-23 DIAGNOSIS — D649 Anemia, unspecified: Secondary | ICD-10-CM | POA: Insufficient documentation

## 2017-12-06 ENCOUNTER — Other Ambulatory Visit: Payer: Self-pay

## 2017-12-06 ENCOUNTER — Ambulatory Visit (HOSPITAL_COMMUNITY): Payer: Medicare Other | Attending: Cardiology

## 2017-12-06 DIAGNOSIS — I351 Nonrheumatic aortic (valve) insufficiency: Secondary | ICD-10-CM

## 2017-12-06 NOTE — Progress Notes (Signed)
Patient ID: Bailey [name], female   DOB: 1933-03-20, 84 y.o.   MRN: [id number]   84 y.o. with history of palpitations , PVC;s controlled with beta blocker. HTN , AR and MVP. She had a normal myovue In May of 2017 for atypical chest pain EF 77%  She sees rheumatology and has Sjogren and Raynaud's some pain in fingers with cold weather She thought she had a kidney issue and Dr Yong Channel wanted her off NSAI's that she uses for left Leg sciatica But her Cr 12/08/17 was normal at .96   Last echo reviewed 12/06/17  Study Conclusions  - Left ventricle: The cavity size was normal. Systolic function was   normal. The estimated ejection fraction was in the range of 60%   to 65%. Wall motion was normal; there were no regional wall   motion abnormalities. Doppler parameters are consistent with   abnormal left ventricular relaxation (grade 1 diastolic   dysfunction). Doppler parameters are consistent with   indeterminate ventricular filling pressure. - Aortic valve: Transvalvular velocity was within the normal range.   There was no stenosis. There was moderate regurgitation. - Mitral valve: Very mild prolapse, involving the anterior leaflet. - Right ventricle: The cavity size was normal. Wall thickness was   normal. Systolic function was normal. - Atrial septum: No defect or patent foramen ovale was identified   by color flow Dopper. - Tricuspid valve: There was moderate regurgitation. - Pulmonary arteries: Systolic pressure was within the normal   range. PA peak pressure: 34 mm Hg (S).  ROS: Denies fever, malais, weight loss, blurry vision, decreased visual acuity, cough, sputum, SOB, hemoptysis, pleuritic pain, palpitaitons, heartburn, abdominal pain, melena, lower extremity edema, claudication, or rash.  All other systems reviewed and negative  General: Vitals:   12/09/17 1036  BP: (!) 144/72  Pulse: 65  SpO2: 98%   Affect appropriate Healthy:  appears stated age 84: normal Neck supple  with no adenopathy JVP normal no bruits no thyromegaly Lungs clear with no wheezing and good diaphragmatic motion Heart:  S1/S2 SEM AR  murmur, no rub, gallop or click PMI normal Abdomen: benighn, BS positve, no tenderness, no AAA no bruit.  No HSM or HJR Distal pulses intact with no bruits Plus one bilateral edema with varicose veins  Neuro non-focal Skin warm and dry No muscular weakness     Current Outpatient Medications  Medication Sig Dispense Refill  . acyclovir (ZOVIRAX) 800 MG tablet Take 1 tablet daily for 5 days as needed for outbreak.    Marland Kitchen aspirin 81 MG tablet Take 81 mg by mouth daily.    . carvedilol (COREG) 3.125 MG tablet TAKE 1 TABLET (3.125 MG TOTAL) BY MOUTH 2 (TWO) TIMES DAILY WITH A MEAL. 180 tablet 2  . Cholecalciferol (VITAMIN D) 400 UNITS capsule Take 2 capsules by mouth daily     . cycloSPORINE (RESTASIS) 0.05 % ophthalmic emulsion Place 1 drop into both eyes 2 (two) times daily.     Marland Kitchen estrogen, conjugated,-medroxyprogesterone (PREMPRO) 0.45-1.5 MG tablet Take 1 tablet by mouth daily. 28 tablet 12  . fish oil-omega-3 fatty acids 1000 MG capsule Take one capsule by mouth  daily    . losartan (COZAAR) 25 MG tablet TAKE 1 TABLET BY MOUTH EVERY DAY 90 tablet 1  . naproxen sodium (ALEVE) 220 MG tablet Take 220 mg by mouth 2 (two) times daily as needed. FOR GENERAL ARTHRITIS PAIN; PCP IS SLOWLY DECREASING IN HOPES TO STOP COMPLETELY     No  current facility-administered medications for this visit.     Allergies  Diamox [acetazolamide]; Epinephrine-lidocaine-na metabisulfite [lidocaine-epinephrine]; Erythromycin; Penicillins; Sulfonamide derivatives; and Tape  Electrocardiogram:  09/04/14  SR rate 70  PVC otherwise normal  10/01/15  SR rate 69  Normal ECG  05/06/16 SR rate 62 LAD otherwise normal  06/06/17  NSR rate 67 nonspecific inferior ST changes   Assessment and Plan Palpitations:  PAC;s /PVC;s  Stable on beta blocker  AR:   Moderate by echo 12/06/17  LV  compensated on ARB  HTN:  White coat component home readings fine continue current dose ARB Edema:  Venous wearing compression stockings improved f/u dermatology  Chest pain:  Improved normal myovue observe  Anxiety:  Seems more easily stressed about things Encouraged her to as primary about SSRI Husbands health is biggest stress   F/U  6 months   Jenkins Rouge

## 2017-12-07 NOTE — Progress Notes (Signed)
Subjective:   Bailey [name] is a 84 y.o. female who presents for Medicare Annual (Subsequent) preventive examination.  Reports health as Seeing Dr. Estanislado Pandy, shali  Dr. Yong Channel 11/27 as needed  Here today to be evaluated for pain in back by Dr .Yong Channel Will start therapy   C.o of being more tried;  Checking heart valves and will see cardiologist tomorrow   Wondering if [facility] but just seen Dr. Estanislado Pandy - ordered PT    Lives with spouse Loves to read    Diet Lipids in 2014; chol/hdl 2 BMI 24    Exercise Not over the holidays. Was travelling Caregiver stress   There are no preventive care reminders to display for this patient. Colonoscopy 06/2012 - aged out  Mammogram 06/2016 - still gets these every year Had one in 2018 Dexa 06/2016 -1.80 - states her bone density has improved    ETOH 2 drinks a week   Tobacco - quit 65' with 9 pack years Started hormones; GYN placed her on this originally Bone density improved    Cardiac Risk Factors include: advanced age (>72men, >106 women);family history of premature cardiovascular disease;hypertension;microalbuminuria    Objective:     Vitals: BP 138/76   Ht 5\' 2"  (1.575 m)   Wt 117 lb (53.1 kg)   LMP  (LMP Unknown)   SpO2 (!) 52%   BMI 21.40 kg/m   Body mass index is 21.4 kg/m.  Advanced Directives 12/09/2017 09/17/2016 01/21/2015 01/09/2015  Does Patient Have a Medical Advance Directive? Yes Yes Yes -  Type of Advance Directive - - - [place]  Does patient want to make changes to medical advance directive? - - Yes - Spiritual care consult ordered -  Copy of [place] in Chart? - - No - copy requested -    Tobacco Social History   Tobacco Use  Smoking Status Former Smoker  . Packs/day: 1.00  . Years: 9.00  . Pack years: 9.00  . Types: Cigarettes  . Last attempt to quit: 11/30/1963  . Years since quitting: 54.0  Smokeless Tobacco Never Used     Counseling given:  Yes   Clinical Intake:    Past Medical History:  Diagnosis Date  . Allergy    seasonal  . Allergy, unspecified not elsewhere classified   . Anemia, unspecified   . Benign neoplasm of colon   . Cancer [facility])    basal/sqamous/transitional melanoma  . COLONIC POLYPS 09/04/2008   Was told by Gi does not need anymore.    . Diverticulosis of colon (without mention of hemorrhage)   . Dry eye syndrome   . Dysrhythmia    PVCs  . Esophageal reflux   . Hepatitis 1950s   infectious from food  . History of atrial fibrillation   . Hypertension   . Irritable bowel syndrome   . Mitral valve disorders(424.0)   . Osteopenia 06/2016   T score -1.7 FRAX FRAX 11%/2.5% stable from prior DEXA  . Palpitations   . Premature ventricular contractions   . Rheumatoid arthritis(714.0)   . Sicca syndrome ([place])   . Ulcer 1992  . Vaginal delivery 1959, 1960, 1962, 1964   Past Surgical History:  Procedure Laterality Date  . APPENDECTOMY    . CATARACT EXTRACTION    . CHOLECYSTECTOMY    . colon tumor  1969   Benign rectal tumor excised  . DILATATION & CURETTAGE/HYSTEROSCOPY WITH MYOSURE N/A 01/21/2015   Procedure: DILATATION & CURETTAGE/HYSTEROSCOPY WITH  Jacklynn Barnacle;  Surgeon: [name], MD;  Location: [facility];  Service: Gynecology;  Laterality: N/A;  . HIATAL HERNIA REPAIR    . REFRACTIVE SURGERY    . ROTATOR CUFF REPAIR  2009   right by Dr. French Ana  . squamous cell removed  2013   Dr. Sherrye Payor --removed from her right cheek  . TUBAL LIGATION     Family History  Problem Relation Age of Onset  . Stroke Mother   . Hypertension Mother   . Ulcerative colitis Father   . Heart attack Father   . Breast cancer Maternal Grandmother        Age 70's  . Uterine cancer Maternal Grandmother   . Hypertension Maternal Grandfather   . Colon cancer Paternal Grandmother    Social History   Socioeconomic History  . Marital status: Married    Spouse name: Doren Custard  . Number of children: 4  . Years of  education: Not on file  . Highest education level: Not on file  Social Needs  . Financial resource strain: Not on file  . Food insecurity - worry: Not on file  . Food insecurity - inability: Not on file  . Transportation needs - medical: Not on file  . Transportation needs - non-medical: Not on file  Occupational History  . Not on file  Tobacco Use  . Smoking status: Former Smoker    Packs/day: 1.00    Years: 9.00    Pack years: 9.00    Types: Cigarettes    Last attempt to quit: 11/30/1963    Years since quitting: 54.0  . Smokeless tobacco: Never Used  Substance and Sexual Activity  . Alcohol use: Yes    Alcohol/week: 4.2 oz    Types: 7 Glasses of wine per week    Comment: generally drinks before dinner on the weekend   . Drug use: No  . Sexual activity: Not Currently    Birth control/protection: Post-menopausal    Comment: 1st intercourse 64 yo-1 partner  Other Topics Concern  . Not on file  Social History Narrative   Married 1958. 4 children. 10 grandkids (1 trying to go to medical school). No greatgrandkids.       Retired from nursing (medical and surgical, most recently in cardiology)   Degree in history and english after finished nursing- 2 mo before 72th birthday      Hobbies: education, travel, reading, piano     Outpatient Encounter Medications as of 12/08/2017  Medication Sig  . acyclovir (ZOVIRAX) 800 MG tablet Take 1 tablet daily for 5 days as needed for outbreak.  Marland Kitchen aspirin 81 MG tablet Take 81 mg by mouth daily.  . carvedilol (COREG) 3.125 MG tablet TAKE 1 TABLET (3.125 MG TOTAL) BY MOUTH 2 (TWO) TIMES DAILY WITH A MEAL.  Marland Kitchen Cholecalciferol (VITAMIN D) 400 UNITS capsule Take 2 capsules by mouth daily   . cycloSPORINE (RESTASIS) 0.05 % ophthalmic emulsion Place 1 drop into both eyes 2 (two) times daily.   Marland Kitchen estrogen, conjugated,-medroxyprogesterone (PREMPRO) 0.45-1.5 MG tablet Take 1 tablet by mouth daily.  . fish oil-omega-3 fatty acids 1000 MG capsule Take one  capsule by mouth  daily  . losartan (COZAAR) 25 MG tablet TAKE 1 TABLET BY MOUTH EVERY DAY  . naproxen sodium (ANAPROX) 220 MG tablet Take 220 mg by mouth 2 (two) times daily as needed.    No facility-administered encounter medications on file as of 12/08/2017.     Activities of Daily Living In your  present state of health, do you have any difficulty performing the following activities: 12/09/2017  Hearing? N  Vision? N  Difficulty concentrating or making decisions? N  Walking or climbing stairs? N  Dressing or bathing? N  Doing errands, shopping? N  Preparing Food and eating ? N  Using the Toilet? N  In the past six months, have you accidently leaked urine? N  Do you have problems with loss of bowel control? N  Managing your Medications? N  Managing your Finances? N  Housekeeping or managing your Housekeeping? N  Some recent data might be hidden    Patient Care Team: Marin Olp, MD as PCP - General (Family Medicine)    Assessment:   This is a routine wellness examination for [place].  Exercise Activities and Dietary recommendations Current Exercise Habits: Home exercise routine, Type of exercise: walking(is very busy  most days; has some health issues presently )  Goals    . Patient Stated     Monitor caregiver fatigue Caregiver support groups may be helpful  [employer]  [employer]; [phone] Management consultant; Information regarding Long Term Care List of caregiver groups as well as senior options in the community   Explore caregiver fatigue https://caregiver.com/articles/fighting_caregiver_fatigue/       Fall Risk Fall Risk  12/09/2017 10/25/2017 09/17/2016 11/10/2015 10/21/2014  Falls in the past year? No No No No No     Depression Screen PHQ 2/9 Scores 12/09/2017 10/25/2017 09/17/2016 11/10/2015  PHQ - 2 Score 0 0 0 0    Discussed;  Agreed her fatigue may be related to caregiver stress Spouse is fairly dependent on  her  Home is large and souse thinking of scaling down or moving to retirement home but she is not interested  Cognitive Function MMSE - Mini Mental State Exam 12/09/2017 09/17/2016  Not completed: (No Data) (No Data)    no issues; very independent     Immunization History  Administered Date(s) Administered  . H1N1 11/26/2008  . Influenza Split 08/30/2011, 10/31/2012  . Influenza Whole 09/05/2008, 08/27/2009, 08/20/2010  . Influenza, High Dose Seasonal PF 08/31/2016, 09/05/2017  . Influenza,inj,Quad PF,6+ Mos 09/13/2013, 10/04/2014, 10/01/2015  . Pneumococcal Conjugate-13 10/21/2014  . Pneumococcal Polysaccharide-23 10/11/2015  . Td 02/17/2010  . Zoster 11/29/2008      Screening Tests Health Maintenance  Topic Date Due  . TETANUS/TDAP  02/18/2020  . INFLUENZA VACCINE  Completed  . DEXA SCAN  Completed  . PNA vac Low Risk Adult  Completed         Plan:      PCP Notes   Health Maintenance No screens due. Continues with mammogram every year Reports bone density improved originally with estrogen  Abnormal Screens  None   Referrals  none  Patient concerns; C/o of fatigue. Discussed her health issues and her caregiver role.  Is in process of dealing with spouse increasing disability and spouse wants to sale the home and move to retirement which she opposes.  Denies depression but agrees fatigue may be somewhat related to caregiver fatigue and her current pain issues. Sleeps well; to fup with cardiologist 12/09/17  Recommended she consider care giving group   Nurse Concerns; As noted   Next PCP apt Was today       I have personally reviewed and noted the following in the patient's chart:   . Medical and social history . Use of alcohol, tobacco or illicit drugs  . Current medications and supplements . Functional ability and  status . Nutritional status . Physical activity . Advanced directives . List of other physicians . Hospitalizations,  surgeries, and ER visits in previous 12 months . Vitals . Screenings to include cognitive, depression, and falls . Referrals and appointments  In addition, I have reviewed and discussed with patient certain preventive protocols, quality metrics, and best practice recommendations. A written personalized care plan for preventive services as well as general preventive health recommendations were provided to patient.     Wynetta Fines, RN  12/09/2017

## 2017-12-08 ENCOUNTER — Encounter: Payer: Self-pay | Admitting: *Deleted

## 2017-12-08 ENCOUNTER — Ambulatory Visit (INDEPENDENT_AMBULATORY_CARE_PROVIDER_SITE_OTHER): Payer: Medicare Other | Admitting: Family Medicine

## 2017-12-08 ENCOUNTER — Ambulatory Visit (INDEPENDENT_AMBULATORY_CARE_PROVIDER_SITE_OTHER): Payer: Medicare Other | Admitting: *Deleted

## 2017-12-08 ENCOUNTER — Encounter: Payer: Self-pay | Admitting: Family Medicine

## 2017-12-08 VITALS — BP 138/76 | HR 70 | Temp 98.0°F | Ht 62.0 in | Wt 117.0 lb

## 2017-12-08 VITALS — BP 138/76 | Ht 62.0 in | Wt 117.0 lb

## 2017-12-08 DIAGNOSIS — I10 Essential (primary) hypertension: Secondary | ICD-10-CM

## 2017-12-08 DIAGNOSIS — Z0000 Encounter for general adult medical examination without abnormal findings: Secondary | ICD-10-CM

## 2017-12-08 DIAGNOSIS — M3500 Sicca syndrome, unspecified: Secondary | ICD-10-CM | POA: Diagnosis not present

## 2017-12-08 DIAGNOSIS — N183 Chronic kidney disease, stage 3 unspecified: Secondary | ICD-10-CM | POA: Insufficient documentation

## 2017-12-08 DIAGNOSIS — M3501 Sicca syndrome with keratoconjunctivitis: Secondary | ICD-10-CM

## 2017-12-08 DIAGNOSIS — I359 Nonrheumatic aortic valve disorder, unspecified: Secondary | ICD-10-CM

## 2017-12-08 DIAGNOSIS — Z8679 Personal history of other diseases of the circulatory system: Secondary | ICD-10-CM | POA: Diagnosis not present

## 2017-12-08 DIAGNOSIS — I351 Nonrheumatic aortic (valve) insufficiency: Secondary | ICD-10-CM

## 2017-12-08 HISTORY — DX: Nonrheumatic aortic (valve) insufficiency: I35.1

## 2017-12-08 HISTORY — DX: Chronic kidney disease, stage 3 unspecified: N18.30

## 2017-12-08 NOTE — Assessment & Plan Note (Addendum)
S: Patient with recent echocardiogram showing moderate aortic regurgitation- stable over the last 1-2 years. Was noted as mild back to 2012 A/P: Patient asks me about risks of progression. Reviewed with her stability over last year as well as only slight progression from echo 2012. Encouraged her to have this conversation with her cardiologist who she sees  Tomorrow. She asks me if her fatigue/sleepiness in afternoon is from the regurgitation- I told her I thought this was more likely from her multiple trips/events/visitors over the last 6 weeks. Congratulated her again on her 62th anniversary back in 09/2017. Will also get CBC, cmp to evaluate the fatigue.

## 2017-12-08 NOTE — Assessment & Plan Note (Signed)
coreg for rate control if needed (also has history of PVCs and PACs and has rare palpitatoins). On aspirin in case she goes back into a fib- no further anticoagulation unless she goes back in to a fib per Dr. Johnsie Cancel

## 2017-12-08 NOTE — Assessment & Plan Note (Signed)
S: we reviewed last few GFR #s which have been in the 50s. She has been using aleve twice a day for her back but prior to that had been using once a day for years. She is not only having pain across her back but also into left hip and into her left leg- seems to be worse in winter months.  A/P: Discussed trying to avoid nsaids- she gets a lot of benefit from aleve. I told her could finish 1 week course of BID but then would try to cut this out. She is going to see PT as ordered by her rheumatologist for her left hip pain radiating into her left leg. I would also consider sports medicine referral.

## 2017-12-08 NOTE — Patient Instructions (Addendum)
Please stop by lab before you go  [facility] for short term aleve but want you to try to get off if able due to mild decrease in kidney function.   Hope the PT helps for your back- happy to see you back if not helping to consider other options  Blood pressure looks better today than last visit BP Readings from Last 3 Encounters:  12/08/17 138/76  11/15/17 (!) 150/67  10/25/17 104/70

## 2017-12-08 NOTE — Assessment & Plan Note (Signed)
S: controlled on losartan 25mg , coreg 3.25mg  BID BP Readings from Last 3 Encounters:  12/08/17 138/76  11/15/17 (!) 150/67  10/25/17 104/70  A/P:  blood pressure goal of <140/90. Continue current meds

## 2017-12-08 NOTE — Patient Instructions (Addendum)
Bailey [name] , Thank you for taking time to come for your Medicare Wellness Visit. I appreciate your ongoing commitment to your health goals. Please review the following plan we discussed and let me know if I can assist you in the future.   Recommend care-giving group if time permits!  You had your "shingles vaccine" in 2010. As of 2018 there is another vaccine called the shingrix. This is covered under your part d benefit and taken at the pharmacy. For now, there is a waiting list. Please check with your benefits regarding applicable copays or out of pocket expenses. The Shingrix is given in 2 vaccines approx 8 weeks apart. You must receive the 2nd dose prior to 6 months from receipt of the first.   Your Tetanus is due in 2021. You will need to take the Tdap, which has pertussis vaccine in it instead of a regular TD.  Happy New Year, It was a pleasure to meet and talk with you!   These are the goals we discussed: Goals    . Patient Stated     Monitor caregiver fatigue Caregiver support groups may be helpful  [employer]  [employer]; [phone] Management consultant; Information regarding Long Term Care List of caregiver groups as well as senior options in the community   Explore caregiver fatigue https://caregiver.com/articles/fighting_caregiver_fatigue/       This is a list of the screening recommended for you and due dates:  Health Maintenance  Topic Date Due  . Tetanus Vaccine  02/18/2020  . Flu Shot  Completed  . DEXA scan (bone density measurement)  Completed  . Pneumonia vaccines  Completed      Fall Prevention in the Home Falls can cause injuries. They can happen to people of all ages. There are many things you can do to make your home safe and to help prevent falls. What can I do on the outside of my home?  Regularly fix the edges of walkways and driveways and fix any cracks.  Remove anything that might make you trip as you walk  through a door, such as a raised step or threshold.  Trim any bushes or trees on the path to your home.  Use bright outdoor lighting.  Clear any walking paths of anything that might make someone trip, such as rocks or tools.  Regularly check to see if handrails are loose or broken. Make sure that both sides of any steps have handrails.  Any raised decks and porches should have guardrails on the edges.  Have any leaves, snow, or ice cleared regularly.  Use sand or salt on walking paths during winter.  Clean up any spills in your garage right away. This includes oil or grease spills. What can I do in the bathroom?  Use night lights.  Install grab bars by the toilet and in the tub and shower. Do not use towel bars as grab bars.  Use non-skid mats or decals in the tub or shower.  If you need to sit down in the shower, use a plastic, non-slip stool.  Keep the floor dry. Clean up any water that spills on the floor as soon as it happens.  Remove soap buildup in the tub or shower regularly.  Attach bath mats securely with double-sided non-slip rug tape.  Do not have throw rugs and other things on the floor that can make you trip. What can I do in the bedroom?  Use night lights.  Make sure that you have a  light by your bed that is easy to reach.  Do not use any sheets or blankets that are too big for your bed. They should not hang down onto the floor.  Have a firm chair that has side arms. You can use this for support while you get dressed.  Do not have throw rugs and other things on the floor that can make you trip. What can I do in the kitchen?  Clean up any spills right away.  Avoid walking on wet floors.  Keep items that you use a lot in easy-to-reach places.  If you need to reach something above you, use a strong step stool that has a grab bar.  Keep electrical cords out of the way.  Do not use floor polish or wax that makes floors slippery. If you must use wax,  use non-skid floor wax.  Do not have throw rugs and other things on the floor that can make you trip. What can I do with my stairs?  Do not leave any items on the stairs.  Make sure that there are handrails on both sides of the stairs and use them. Fix handrails that are broken or loose. Make sure that handrails are as long as the stairways.  Check any carpeting to make sure that it is firmly attached to the stairs. Fix any carpet that is loose or worn.  Avoid having throw rugs at the top or bottom of the stairs. If you do have throw rugs, attach them to the floor with carpet tape.  Make sure that you have a light switch at the top of the stairs and the bottom of the stairs. If you do not have them, ask someone to add them for you. What else can I do to help prevent falls?  Wear shoes that: ? Do not have high heels. ? Have rubber bottoms. ? Are comfortable and fit you well. ? Are closed at the toe. Do not wear sandals.  If you use a stepladder: ? Make sure that it is fully opened. Do not climb a closed stepladder. ? Make sure that both sides of the stepladder are locked into place. ? Ask someone to hold it for you, if possible.  Clearly mark and make sure that you can see: ? Any grab bars or handrails. ? First and last steps. ? Where the edge of each step is.  Use tools that help you move around (mobility aids) if they are needed. These include: ? Canes. ? Walkers. ? Scooters. ? Crutches.  Turn on the lights when you go into a dark area. Replace any light bulbs as soon as they burn out.  Set up your furniture so you have a clear path. Avoid moving your furniture around.  If any of your floors are uneven, fix them.  If there are any pets around you, be aware of where they are.  Review your medicines with your doctor. Some medicines can make you feel dizzy. This can increase your chance of falling. Ask your doctor what other things that you can do to help prevent  falls. This information is not intended to replace advice given to you by your health care provider. Make sure you discuss any questions you have with your health care provider. Document Released: 09/11/2009 Document Revised: 04/22/2016 Document Reviewed: 12/20/2014 Elsevier Interactive Patient Education  2018 [place] Maintenance, Female Adopting a healthy lifestyle and getting preventive care can go a long way to promote health and wellness.  Talk with your health care provider about what schedule of regular examinations is right for you. This is a good chance for you to check in with your provider about disease prevention and staying healthy. In between checkups, there are plenty of things you can do on your own. Experts have done a lot of research about which lifestyle changes and preventive measures are most likely to keep you healthy. Ask your health care provider for more information. Weight and diet Eat a healthy diet  Be sure to include plenty of vegetables, fruits, low-fat dairy products, and lean protein.  Do not eat a lot of foods high in solid fats, added sugars, or salt.  Get regular exercise. This is one of the most important things you can do for your health. ? Most adults should exercise for at least 150 minutes each week. The exercise should increase your heart rate and make you sweat (moderate-intensity exercise). ? Most adults should also do strengthening exercises at least twice a week. This is in addition to the moderate-intensity exercise.  Maintain a healthy weight  Body mass index (BMI) is a measurement that can be used to identify possible weight problems. It estimates body fat based on height and weight. Your health care provider can help determine your BMI and help you achieve or maintain a healthy weight.  For females 21 years of age and older: ? A BMI below 18.5 is considered underweight. ? A BMI of 18.5 to 24.9 is normal. ? A BMI of 25 to 29.9 is  considered overweight. ? A BMI of 30 and above is considered obese.  Watch levels of cholesterol and blood lipids  You should start having your blood tested for lipids and cholesterol at 82 years of age, then have this test every 5 years.  You may need to have your cholesterol levels checked more often if: ? Your lipid or cholesterol levels are high. ? You are older than 82 years of age. ? You are at high risk for heart disease.  Cancer screening Lung Cancer  Lung cancer screening is recommended for adults 51-60 years old who are at high risk for lung cancer because of a history of smoking.  A yearly low-dose CT scan of the lungs is recommended for people who: ? Currently smoke. ? Have quit within the past 15 years. ? Have at least a 30-pack-year history of smoking. A pack year is smoking an average of one pack of cigarettes a day for 1 year.  Yearly screening should continue until it has been 15 years since you quit.  Yearly screening should stop if you develop a health problem that would prevent you from having lung cancer treatment.  Breast Cancer  Practice breast self-awareness. This means understanding how your breasts normally appear and feel.  It also means doing regular breast self-exams. Let your health care provider know about any changes, no matter how small.  If you are in your 20s or 30s, you should have a clinical breast exam (CBE) by a health care provider every 1-3 years as part of a regular health exam.  If you are 73 or older, have a CBE every year. Also consider having a breast X-ray (mammogram) every year.  If you have a family history of breast cancer, talk to your health care provider about genetic screening.  If you are at high risk for breast cancer, talk to your health care provider about having an MRI and a mammogram every year.  Breast cancer  gene (BRCA) assessment is recommended for women who have family members with BRCA-related cancers.  BRCA-related cancers include: ? Breast. ? Ovarian. ? Tubal. ? Peritoneal cancers.  Results of the assessment will determine the need for genetic counseling and BRCA1 and BRCA2 testing.  Cervical Cancer Your health care provider may recommend that you be screened regularly for cancer of the pelvic organs (ovaries, uterus, and vagina). This screening involves a pelvic examination, including checking for microscopic changes to the surface of your cervix (Pap test). You may be encouraged to have this screening done every 3 years, beginning at age 55.  For women ages 27-65, health care providers may recommend pelvic exams and Pap testing every 3 years, or they may recommend the Pap and pelvic exam, combined with testing for human papilloma virus (HPV), every 5 years. Some types of HPV increase your risk of cervical cancer. Testing for HPV may also be done on women of any age with unclear Pap test results.  Other health care providers may not recommend any screening for nonpregnant women who are considered low risk for pelvic cancer and who do not have symptoms. Ask your health care provider if a screening pelvic exam is right for you.  If you have had past treatment for cervical cancer or a condition that could lead to cancer, you need Pap tests and screening for cancer for at least 20 years after your treatment. If Pap tests have been discontinued, your risk factors (such as having a new sexual partner) need to be reassessed to determine if screening should resume. Some women have medical problems that increase the chance of getting cervical cancer. In these cases, your health care provider may recommend more frequent screening and Pap tests.  Colorectal Cancer  This type of cancer can be detected and often prevented.  Routine colorectal cancer screening usually begins at 82 years of age and continues through 82 years of age.  Your health care provider may recommend screening at an earlier age if  you have risk factors for colon cancer.  Your health care provider may also recommend using home test kits to check for hidden blood in the stool.  A small camera at the end of a tube can be used to examine your colon directly (sigmoidoscopy or colonoscopy). This is done to check for the earliest forms of colorectal cancer.  Routine screening usually begins at age 29.  Direct examination of the colon should be repeated every 5-10 years through 82 years of age. However, you may need to be screened more often if early forms of precancerous polyps or small growths are found.  Skin Cancer  Check your skin from head to toe regularly.  Tell your health care provider about any new moles or changes in moles, especially if there is a change in a mole's shape or color.  Also tell your health care provider if you have a mole that is larger than the size of a pencil eraser.  Always use sunscreen. Apply sunscreen liberally and repeatedly throughout the day.  Protect yourself by wearing long sleeves, pants, a wide-brimmed hat, and sunglasses whenever you are outside.  Heart disease, diabetes, and high blood pressure  High blood pressure causes heart disease and increases the risk of stroke. High blood pressure is more likely to develop in: ? People who have blood pressure in the high end of the normal range (130-139/85-89 mm Hg). ? People who are overweight or obese. ? People who are African American.  If  you are 43-37 years of age, have your blood pressure checked every 3-5 years. If you are 42 years of age or older, have your blood pressure checked every year. You should have your blood pressure measured twice-once when you are at a hospital or clinic, and once when you are not at a hospital or clinic. Record the average of the two measurements. To check your blood pressure when you are not at a hospital or clinic, you can use: ? An automated blood pressure machine at a pharmacy. ? A home blood  pressure monitor.  If you are between 55 years and 51 years old, ask your health care provider if you should take aspirin to prevent strokes.  Have regular diabetes screenings. This involves taking a blood sample to check your fasting blood sugar level. ? If you are at a normal weight and have a low risk for diabetes, have this test once every three years after 82 years of age. ? If you are overweight and have a high risk for diabetes, consider being tested at a younger age or more often. Preventing infection Hepatitis B  If you have a higher risk for hepatitis B, you should be screened for this virus. You are considered at high risk for hepatitis B if: ? You were born in a country where hepatitis B is common. Ask your health care provider which countries are considered high risk. ? Your parents were born in a high-risk country, and you have not been immunized against hepatitis B (hepatitis B vaccine). ? You have HIV or AIDS. ? You use needles to inject street drugs. ? You live with someone who has hepatitis B. ? You have had sex with someone who has hepatitis B. ? You get hemodialysis treatment. ? You take certain medicines for conditions, including cancer, organ transplantation, and autoimmune conditions.  Hepatitis C  Blood testing is recommended for: ? Everyone born from [birth year] through 1963. ? Anyone with known risk factors for hepatitis C.  Sexually transmitted infections (STIs)  You should be screened for sexually transmitted infections (STIs) including gonorrhea and chlamydia if: ? You are sexually active and are younger than 82 years of age. ? You are older than 82 years of age and your health care provider tells you that you are at risk for this type of infection. ? Your sexual activity has changed since you were last screened and you are at an increased risk for chlamydia or gonorrhea. Ask your health care provider if you are at risk.  If you do not have HIV, but are at risk,  it may be recommended that you take a prescription medicine daily to prevent HIV infection. This is called pre-exposure prophylaxis (PrEP). You are considered at risk if: ? You are sexually active and do not regularly use condoms or know the HIV status of your partner(s). ? You take drugs by injection. ? You are sexually active with a partner who has HIV.  Talk with your health care provider about whether you are at high risk of being infected with HIV. If you choose to begin PrEP, you should first be tested for HIV. You should then be tested every 3 months for as long as you are taking PrEP. Pregnancy  If you are premenopausal and you may become pregnant, ask your health care provider about preconception counseling.  If you may become pregnant, take 400 to 800 micrograms (mcg) of folic acid every day.  If you want to prevent pregnancy, talk to  your health care provider about birth control (contraception). Osteoporosis and menopause  Osteoporosis is a disease in which the bones lose minerals and strength with aging. This can result in serious bone fractures. Your risk for osteoporosis can be identified using a bone density scan.  If you are 40 years of age or older, or if you are at risk for osteoporosis and fractures, ask your health care provider if you should be screened.  Ask your health care provider whether you should take a calcium or vitamin D supplement to lower your risk for osteoporosis.  Menopause may have certain physical symptoms and risks.  Hormone replacement therapy may reduce some of these symptoms and risks. Talk to your health care provider about whether hormone replacement therapy is right for you. Follow these instructions at home:  Schedule regular health, dental, and eye exams.  Stay current with your immunizations.  Do not use any tobacco products including cigarettes, chewing tobacco, or electronic cigarettes.  If you are pregnant, do not drink  alcohol.  If you are breastfeeding, limit how much and how often you drink alcohol.  Limit alcohol intake to no more than 1 drink per day for nonpregnant women. One drink equals 12 ounces of beer, 5 ounces of wine, or 1 ounces of hard liquor.  Do not use street drugs.  Do not share needles.  Ask your health care provider for help if you need support or information about quitting drugs.  Tell your health care provider if you often feel depressed.  Tell your health care provider if you have ever been abused or do not feel safe at home. This information is not intended to replace advice given to you by your health care provider. Make sure you discuss any questions you have with your health care provider. Document Released: 05/31/2011 Document Revised: 04/22/2016 Document Reviewed: 08/19/2015 Elsevier Interactive Patient Education  [employer].

## 2017-12-08 NOTE — Assessment & Plan Note (Signed)
Patient continues follow up with Dr. Estanislado Pandy who has been very helpful/informative to patient.

## 2017-12-08 NOTE — Progress Notes (Signed)
Subjective:  Bailey [name] is a 84 y.o. year old very pleasant female patient who presents for/with See problem oriented charting ROS- fatigue/feels run down from the holidays   Past Medical History-  Patient Active Problem List   Diagnosis Date Noted  . Sjogren's syndrome with keratoconjunctivitis sicca ([place]) 05/12/2017    Priority: High  . History of atrial fibrillation 09/05/2008    Priority: High  . CKD (chronic kidney disease), stage III ([place]) 12/08/2017    Priority: Medium  . HTN (hypertension) 03/13/2015    Priority: Medium  . Raynauds syndrome 10/21/2014    Priority: Medium  . Primary osteoarthritis of both hands 08/21/2009    Priority: Medium  . PVC (premature ventricular contraction) 06/04/2009    Priority: Medium  . Mitral valve prolapse 12/25/2008    Priority: Medium  . Irritable bowel syndrome 09/04/2008    Priority: Medium  . Premature ventricular contractions     Priority: Low  . Hepatitis     Priority: Low  . History of skin cancer     Priority: Low  . Primary osteoarthritis of both feet 10/26/2016    Priority: Low  . Anemia 03/13/2015    Priority: Low  . 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 Edema 06/29/2011    Priority: Low  . Osteopenia 09/05/2008    Priority: Low  . GERD 09/04/2008    Priority: Low  . ALLERGY 09/04/2008    Priority: Low  . COLONIC POLYPS 09/04/2008    Priority: Low  . Moderate aortic regurgitation 12/08/2017  . Balance disorder 10/26/2016    Medications- reviewed and updated Current Outpatient Medications  Medication Sig Dispense Refill  . acyclovir (ZOVIRAX) 800 MG tablet Take 1 tablet daily for 5 days as needed for outbreak.    Marland Kitchen aspirin 81 MG tablet Take 81 mg by mouth daily.    . carvedilol (COREG) 3.125 MG tablet TAKE 1 TABLET (3.125 MG TOTAL) BY MOUTH 2 (TWO) TIMES DAILY WITH A MEAL. 180 tablet 2  . Cholecalciferol (VITAMIN D) 400 UNITS capsule Take 2 capsules by mouth daily     . cycloSPORINE (RESTASIS)  0.05 % ophthalmic emulsion Place 1 drop into both eyes 2 (two) times daily.     Marland Kitchen estrogen, conjugated,-medroxyprogesterone (PREMPRO) 0.45-1.5 MG tablet Take 1 tablet by mouth daily. 28 tablet 12  . fish oil-omega-3 fatty acids 1000 MG capsule Take one capsule by mouth  daily    . losartan (COZAAR) 25 MG tablet TAKE 1 TABLET BY MOUTH EVERY DAY 90 tablet 1  . naproxen sodium (ANAPROX) 220 MG tablet Take 220 mg by mouth 2 (two) times daily as needed.      No current facility-administered medications for this visit.     Objective: BP 138/76 (BP Location: Left Arm, Patient Position: Sitting, Cuff Size: Large)   Pulse 70   Temp 98 F (36.7 C) (Oral)   Ht 5\' 2"  (1.575 m)   Wt 117 lb (53.1 kg)   LMP  (LMP Unknown)   SpO2 95%   BMI 21.40 kg/m  Gen: NAD, resting comfortably CV: RRR no rubs or gallops Lungs: CTAB no crackles, wheeze, rhonchi Abdomen: soft/nontender/nondistended/normal bowel sounds.  Ext: no edema Skin: warm, dry, no rash  Assessment/Plan:  HTN (hypertension) S: controlled on losartan 25mg , coreg 3.25mg  BID BP Readings from Last 3 Encounters:  12/08/17 138/76  11/15/17 (!) 150/67  10/25/17 104/70  A/P:  blood pressure goal of <140/90. Continue current meds  CKD (  chronic kidney disease), stage III ([place]) S: we reviewed last few GFR #s which have been in the 45s. She has been using aleve twice a day for her back but prior to that had been using once a day for years. She is not only having pain across her back but also into left hip and into her left leg- seems to be worse in winter months.  A/P: Discussed trying to avoid nsaids- she gets a lot of benefit from aleve. I told her could finish 1 week course of BID but then would try to cut this out. She is going to see PT as ordered by her rheumatologist for her left hip pain radiating into her left leg. I would also consider sports medicine referral.    History of atrial fibrillation coreg for rate control if needed (also  has history of PVCs and PACs and has rare palpitatoins). On aspirin in case she goes back into a fib- no further anticoagulation unless she goes back in to a fib per Dr. Johnsie Cancel  Moderate aortic regurgitation S: Patient with recent echocardiogram showing moderate aortic regurgitation- stable over the last 1-2 years. Was noted as mild back to 2012 A/P: Patient asks me about risks of progression. Reviewed with her stability over last year as well as only slight progression from echo 2012. Encouraged her to have this conversation with her cardiologist who she sees  Tomorrow. She asks me if her fatigue/sleepiness in afternoon is from the regurgitation- I told her I thought this was more likely from her multiple trips/events/visitors over the last 6 weeks. Congratulated her again on her 62th anniversary back in 09/2017. Will also get CBC, cmp to evaluate the fatigue.   Sjogren's syndrome with keratoconjunctivitis sicca ([place]) Patient continues follow up with Dr. Estanislado Pandy who has been very helpful/informative to patient.    Future Appointments  Date Time Provider [place]  12/09/2017 10:30 AM Josue Hector, MD [facility]  05/16/2018  1:30 PM Bo Merino, MD [facility] None  06/06/2018 10:00 AM Marin Olp, MD [facility]  follow up  Order associations Essential hypertension - Plan: CBC, Comprehensive metabolic panel, LDL cholesterol, direct  Return precautions advised.  Garret Reddish, MD

## 2017-12-09 ENCOUNTER — Ambulatory Visit (INDEPENDENT_AMBULATORY_CARE_PROVIDER_SITE_OTHER): Payer: Medicare Other | Admitting: Cardiovascular Disease

## 2017-12-09 ENCOUNTER — Encounter: Payer: Self-pay | Admitting: Cardiovascular Disease

## 2017-12-09 VITALS — BP 144/72 | HR 65 | Ht 62.0 in | Wt 116.5 lb

## 2017-12-09 DIAGNOSIS — I480 Paroxysmal atrial fibrillation: Secondary | ICD-10-CM | POA: Diagnosis not present

## 2017-12-09 DIAGNOSIS — I351 Nonrheumatic aortic (valve) insufficiency: Secondary | ICD-10-CM

## 2017-12-09 DIAGNOSIS — I341 Nonrheumatic mitral (valve) prolapse: Secondary | ICD-10-CM | POA: Diagnosis not present

## 2017-12-09 DIAGNOSIS — T7840XA Allergy, unspecified, initial encounter: Secondary | ICD-10-CM | POA: Insufficient documentation

## 2017-12-09 LAB — CBC
HCT: 37.1 % (ref 36.0–46.0)
Hemoglobin: 12.2 g/dL (ref 12.0–15.0)
MCHC: 32.9 g/dL (ref 30.0–36.0)
MCV: 94 fl (ref 78.0–100.0)
PLATELETS: 330 10*3/uL (ref 150.0–400.0)
RBC: 3.95 Mil/uL (ref 3.87–5.11)
RDW: 13.1 % (ref 11.5–15.5)
WBC: 10.1 10*3/uL (ref 4.0–10.5)

## 2017-12-09 LAB — COMPREHENSIVE METABOLIC PANEL
ALBUMIN: 4 g/dL (ref 3.5–5.2)
ALK PHOS: 47 U/L (ref 39–117)
ALT: 12 U/L (ref 0–35)
AST: 17 U/L (ref 0–37)
BUN: 20 mg/dL (ref 6–23)
CALCIUM: 9.3 mg/dL (ref 8.4–10.5)
CO2: 28 mEq/L (ref 19–32)
CREATININE: 0.94 mg/dL (ref 0.40–1.20)
Chloride: 102 mEq/L (ref 96–112)
GFR: 60.54 mL/min (ref >60.00)
Glucose, Bld: 106 mg/dL — ABNORMAL HIGH (ref 70–99)
POTASSIUM: 4.5 meq/L (ref 3.5–5.1)
SODIUM: 138 meq/L (ref 135–145)
TOTAL PROTEIN: 7.2 g/dL (ref 6.0–8.3)
Total Bilirubin: 0.3 mg/dL (ref 0.2–1.2)

## 2017-12-09 LAB — LDL CHOLESTEROL, DIRECT: LDL DIRECT: 75 mg/dL

## 2017-12-09 NOTE — Patient Instructions (Addendum)

## 2017-12-10 NOTE — Progress Notes (Signed)
I have reviewed and agree with note, evaluation, plan.  See my separate note from today  Lasharon Dunivan, MD  

## 2017-12-12 DIAGNOSIS — M5416 Radiculopathy, lumbar region: Secondary | ICD-10-CM | POA: Insufficient documentation

## 2017-12-15 DIAGNOSIS — M5416 Radiculopathy, lumbar region: Secondary | ICD-10-CM | POA: Diagnosis not present

## 2017-12-20 DIAGNOSIS — M5416 Radiculopathy, lumbar region: Secondary | ICD-10-CM | POA: Diagnosis not present

## 2017-12-23 DIAGNOSIS — M5416 Radiculopathy, lumbar region: Secondary | ICD-10-CM | POA: Diagnosis not present

## 2017-12-28 DIAGNOSIS — M5416 Radiculopathy, lumbar region: Secondary | ICD-10-CM | POA: Diagnosis not present

## 2017-12-30 DIAGNOSIS — M5416 Radiculopathy, lumbar region: Secondary | ICD-10-CM | POA: Diagnosis not present

## 2018-01-03 DIAGNOSIS — M5416 Radiculopathy, lumbar region: Secondary | ICD-10-CM | POA: Diagnosis not present

## 2018-01-05 DIAGNOSIS — M5416 Radiculopathy, lumbar region: Secondary | ICD-10-CM | POA: Diagnosis not present

## 2018-01-10 DIAGNOSIS — M5416 Radiculopathy, lumbar region: Secondary | ICD-10-CM | POA: Diagnosis not present

## 2018-01-12 DIAGNOSIS — M5416 Radiculopathy, lumbar region: Secondary | ICD-10-CM | POA: Diagnosis not present

## 2018-01-17 DIAGNOSIS — M5416 Radiculopathy, lumbar region: Secondary | ICD-10-CM | POA: Diagnosis not present

## 2018-01-20 DIAGNOSIS — M5416 Radiculopathy, lumbar region: Secondary | ICD-10-CM | POA: Diagnosis not present

## 2018-01-24 DIAGNOSIS — M5416 Radiculopathy, lumbar region: Secondary | ICD-10-CM | POA: Diagnosis not present

## 2018-01-26 DIAGNOSIS — M5416 Radiculopathy, lumbar region: Secondary | ICD-10-CM | POA: Diagnosis not present

## 2018-01-28 ENCOUNTER — Encounter (HOSPITAL_COMMUNITY): Payer: Self-pay | Admitting: Emergency Medicine

## 2018-01-28 ENCOUNTER — Emergency Department (HOSPITAL_COMMUNITY)
Admission: EM | Admit: 2018-01-28 | Discharge: 2018-01-28 | Disposition: A | Discharge status: Home / Self Care | Payer: Medicare Other | Attending: Emergency Medicine | Admitting: Emergency Medicine

## 2018-01-28 ENCOUNTER — Other Ambulatory Visit: Payer: Self-pay

## 2018-01-28 DIAGNOSIS — I10 Essential (primary) hypertension: Secondary | ICD-10-CM

## 2018-01-28 DIAGNOSIS — Z79899 Other long term (current) drug therapy: Secondary | ICD-10-CM | POA: Insufficient documentation

## 2018-01-28 DIAGNOSIS — N183 Chronic kidney disease, stage 3 (moderate): Secondary | ICD-10-CM | POA: Diagnosis not present

## 2018-01-28 DIAGNOSIS — Z9049 Acquired absence of other specified parts of digestive tract: Secondary | ICD-10-CM | POA: Insufficient documentation

## 2018-01-28 DIAGNOSIS — I129 Hypertensive chronic kidney disease with stage 1 through stage 4 chronic kidney disease, or unspecified chronic kidney disease: Secondary | ICD-10-CM | POA: Diagnosis not present

## 2018-01-28 DIAGNOSIS — R42 Dizziness and giddiness: Secondary | ICD-10-CM | POA: Diagnosis not present

## 2018-01-28 DIAGNOSIS — Z7982 Long term (current) use of aspirin: Secondary | ICD-10-CM | POA: Insufficient documentation

## 2018-01-28 DIAGNOSIS — Z85828 Personal history of other malignant neoplasm of skin: Secondary | ICD-10-CM | POA: Insufficient documentation

## 2018-01-28 DIAGNOSIS — Z87891 Personal history of nicotine dependence: Secondary | ICD-10-CM | POA: Insufficient documentation

## 2018-01-28 DIAGNOSIS — R5383 Other fatigue: Secondary | ICD-10-CM | POA: Diagnosis not present

## 2018-01-28 LAB — CBC
HCT: 37.6 % (ref 36.0–46.0)
Hemoglobin: 12 g/dL (ref 12.0–15.0)
MCH: 30.4 pg (ref 26.0–34.0)
MCHC: 31.9 g/dL (ref 30.0–36.0)
MCV: 95.2 fL (ref 78.0–100.0)
PLATELETS: 297 10*3/uL (ref 150–400)
RBC: 3.95 MIL/uL (ref 3.87–5.11)
RDW: 13.1 % (ref 11.5–15.5)
WBC: 9 10*3/uL (ref 4.0–10.5)

## 2018-01-28 LAB — COMPREHENSIVE METABOLIC PANEL
ALT: 15 U/L (ref 14–54)
AST: 18 U/L (ref 15–41)
Albumin: 3.8 g/dL (ref 3.5–5.0)
Alkaline Phosphatase: 51 U/L (ref 38–126)
Anion gap: 12 (ref 5–15)
BUN: 19 mg/dL (ref 6–20)
CHLORIDE: 100 mmol/L — AB (ref 101–111)
CO2: 24 mmol/L (ref 22–32)
Calcium: 9.4 mg/dL (ref 8.9–10.3)
Creatinine, Ser: 0.95 mg/dL (ref 0.44–1.00)
GFR, EST NON AFRICAN AMERICAN: 54 mL/min — AB (ref >60)
Glucose, Bld: 105 mg/dL — ABNORMAL HIGH (ref 65–99)
POTASSIUM: 3.8 mmol/L (ref 3.5–5.1)
Sodium: 136 mmol/L (ref 135–145)
Total Bilirubin: 0.6 mg/dL (ref 0.3–1.2)
Total Protein: 7.4 g/dL (ref 6.5–8.1)

## 2018-01-28 MED ORDER — METOPROLOL TARTRATE 25 MG PO TABS
25.0000 mg | ORAL_TABLET | Freq: Once | ORAL | Status: AC
  Administered 2018-01-28: 25 mg via ORAL
  Filled 2018-01-28: qty 1

## 2018-01-28 MED ORDER — METOPROLOL TARTRATE 5 MG/5ML IV SOLN: 5.0000 mg | Freq: Once | INTRAVENOUS | Status: DC

## 2018-01-28 MED ORDER — CARVEDILOL 3.125 MG PO TABS: 3.1250 mg | ORAL_TABLET | Freq: Two times a day (BID) | ORAL | 2 refills | Status: DC

## 2018-01-28 NOTE — ED Provider Notes (Signed)
[place] [facility] EMERGENCY DEPARTMENT Provider Note   CSN: [id number] Arrival date & time: 01/28/18  1716     History   Chief Complaint Chief Complaint  Patient presents with  . Hypertension    HPI Bailey [name] is a 84 y.o. female.staes tired today and fell asleep.  Contined to feel groggy had breakfast and took carvedilol.  Took bp and it was 159,160, 148.  States she continued to feel tired. At dinner, took losaetan at noon.  At 445 185/97. Denies headache but some [ulsing in head.  Some lightheadedness.  No lateralized weakness, no vision changes, no difficulty walking.  Took pm carvedilol 2.5   HPI  Past Medical History:  Diagnosis Date  . Allergy, unspecified not elsewhere classified   . Anemia, unspecified   . Benign neoplasm of colon   . Cancer [facility])    basal/sqamous/transitional melanoma  . COLONIC POLYPS 09/04/2008   Was told by Gi does not need anymore.    . Diverticulosis of colon (without mention of hemorrhage)   . Dry eye syndrome   . Dysrhythmia    PVCs  . Esophageal reflux   . Hepatitis 1950s   infectious from food  . History of atrial fibrillation   . Hypertension   . Irritable bowel syndrome   . Mitral valve disorders(424.0)   . Osteopenia 06/2016   T score -1.7 FRAX FRAX 11%/2.5% stable from prior DEXA  . Palpitations   . Premature ventricular contractions   . Rheumatoid arthritis(714.0)   . Sicca syndrome ([place])   . Ulcer 1992  . Vaginal delivery 1959, 1960, 1962, 1964    Patient Active Problem List   Diagnosis Date Noted  . Allergy   . CKD (chronic kidney disease), stage III ([place]) 12/08/2017  . Moderate aortic regurgitation 12/08/2017  . Premature ventricular contractions   . Hepatitis   . History of skin cancer   . Sjogren's syndrome with keratoconjunctivitis sicca ([place]) 05/12/2017  . Balance disorder 10/26/2016  . Primary osteoarthritis of both feet 10/26/2016  . HTN (hypertension) 03/13/2015  . Anemia 03/13/2015  .  Raynauds syndrome 10/21/2014  . Recurrent cold sores 10/21/2014  . Edema 06/29/2011  . Primary osteoarthritis of both hands 08/21/2009  . PVC (premature ventricular contraction) 06/04/2009  . Mitral valve prolapse 12/25/2008  . History of atrial fibrillation 09/05/2008  . Osteopenia 09/05/2008  . GERD 09/04/2008  . Irritable bowel syndrome 09/04/2008  . ALLERGY 09/04/2008  . COLONIC POLYPS 09/04/2008    Past Surgical History:  Procedure Laterality Date  . APPENDECTOMY    . CATARACT EXTRACTION    . CHOLECYSTECTOMY    . colon tumor  1969   Benign rectal tumor excised  . DILATATION & CURETTAGE/HYSTEROSCOPY WITH MYOSURE N/A 01/21/2015   Procedure: DILATATION & CURETTAGE/HYSTEROSCOPY WITH MYOSURE;  Surgeon: [name], MD;  Location: [facility];  Service: Gynecology;  Laterality: N/A;  . HIATAL HERNIA REPAIR    . REFRACTIVE SURGERY    . ROTATOR CUFF REPAIR  2009   right by Dr. French Ana  . squamous cell removed  2013   Dr. Sherrye Payor --removed from her right cheek  . TUBAL LIGATION      OB History    Gravida Para Term Preterm AB Living   5 4 4   1 4    SAB TAB Ectopic Multiple Live Births                   Home Medications    Prior  to Admission medications   Medication Sig Start Date End Date Taking? Authorizing Provider  acyclovir (ZOVIRAX) 800 MG tablet Take 1 tablet daily for 5 days as needed for outbreak.    [provider]  aspirin 81 MG tablet Take 81 mg by mouth daily.    [provider]  carvedilol (COREG) 3.125 MG tablet TAKE 1 TABLET (3.125 MG TOTAL) BY MOUTH 2 (TWO) TIMES DAILY WITH A MEAL. 11/04/17   Josue Hector, MD  Cholecalciferol (VITAMIN D) 400 UNITS capsule Take 2 capsules by mouth daily     [provider]  cycloSPORINE (RESTASIS) 0.05 % ophthalmic emulsion Place 1 drop into both eyes 2 (two) times daily.     [provider]  estrogen, conjugated,-medroxyprogesterone (PREMPRO) 0.45-1.5 MG tablet Take 1 tablet by mouth  daily. 08/24/17   Fontaine, Belinda Block, MD  fish oil-omega-3 fatty acids 1000 MG capsule Take one capsule by mouth  daily    [provider]  losartan (COZAAR) 25 MG tablet TAKE 1 TABLET BY MOUTH EVERY DAY 11/08/17   Josue Hector, MD  naproxen sodium (ALEVE) 220 MG tablet Take 220 mg by mouth 2 (two) times daily as needed. FOR GENERAL ARTHRITIS PAIN; PCP IS SLOWLY DECREASING IN HOPES TO STOP COMPLETELY    [provider]    Family History Family History  Problem Relation Age of Onset  . Stroke Mother   . Hypertension Mother   . Ulcerative colitis Father   . Heart attack Father   . Breast cancer Maternal Grandmother        Age 16's  . Uterine cancer Maternal Grandmother   . Hypertension Maternal Grandfather   . Colon cancer Paternal Grandmother     Social History Social History   Tobacco Use  . Smoking status: Former Smoker    Packs/day: 1.00    Years: 9.00    Pack years: 9.00    Types: Cigarettes    Last attempt to quit: 11/30/1963    Years since quitting: 54.2  . Smokeless tobacco: Never Used  Substance Use Topics  . Alcohol use: Yes    Alcohol/week: 4.2 oz    Types: 7 Glasses of wine per week    Comment: generally drinks before dinner on the weekend   . Drug use: No     Allergies   Diamox [acetazolamide]; Epinephrine-lidocaine-na metabisulfite [lidocaine-epinephrine]; Erythromycin; Penicillins; Sulfonamide derivatives; and Tape   Review of Systems Review of Systems  Constitutional: Positive for fatigue. Negative for chills and fever.  HENT: Negative.   Eyes: Negative.   Respiratory: Negative.   Cardiovascular: Negative.   Gastrointestinal: Negative.   Endocrine: Negative.   Genitourinary: Negative.   Musculoskeletal: Negative.   Skin: Negative.   Allergic/Immunologic: Negative.   Neurological: Positive for dizziness.  Hematological: Negative.   Psychiatric/Behavioral: Negative.      Physical Exam Updated Vital Signs BP (!) 189/78  (BP Location: Right Arm)   Pulse 67   Temp 98.5 F (36.9 C) (Oral)   Resp 16   LMP  (LMP Unknown)   SpO2 99%   Physical Exam  Constitutional: She is oriented to person, place, and time. She appears well-developed and well-nourished.  HENT:  Head: Normocephalic and atraumatic.  Right Ear: External ear normal.  Left Ear: External ear normal.  Nose: Nose normal.  Mouth/Throat: Oropharynx is clear and moist.  Eyes: EOM are normal. Pupils are equal, round, and reactive to light.  Neck: Normal range of motion. Neck supple.  Cardiovascular:  Normal rate, regular rhythm, normal heart sounds and intact distal pulses.  Pulmonary/Chest: Effort normal and breath sounds normal.  Abdominal: Soft. Bowel sounds are normal.  Musculoskeletal: Normal range of motion.  Neurological: She is alert and oriented to person, place, and time. She displays normal reflexes. No cranial nerve deficit or sensory deficit. She exhibits normal muscle tone. Coordination normal.  Skin: Skin is warm and dry. Capillary refill takes less than 2 seconds.  Psychiatric: She has a normal mood and affect. Her behavior is normal.  Nursing note and vitals reviewed.    ED Treatments / Results  Labs (all labs ordered are listed, but only abnormal results are displayed) Labs Reviewed  CBC  COMPREHENSIVE METABOLIC PANEL    EKG  EKG Interpretation  Date/Time:  Saturday January 28 2018 17:31:36 EST Ventricular Rate:  84 PR Interval:  144 QRS Duration: 68 QT Interval:  352 QTC Calculation: 415 R Axis:   -34 Text Interpretation:  Normal sinus rhythm Possible Left atrial enlargement Left axis deviation Nonspecific ST and T wave abnormality Abnormal ECG Confirmed by Pattricia Boss [phone]) on 01/28/2018 7:30:02 PM       Radiology No results found.  Procedures Procedures (including critical care time)  Medications Ordered in ED Medications - No data to display   Initial Impression / Assessment and Plan / ED Course  I  have reviewed the triage vital signs and the nursing notes.  Pertinent labs & imaging results that were available during my care of the patient were reviewed by me and considered in my medical decision making (see chart for details). 84 year old female with known hypertension who has had some generalized dialyzed today.  She took her blood pressure and noted to be significantly elevated and came in secondary to this.  Here she has no specific symptoms related to this.  She has a normal neurological exam.  Blood pressure has decreased to systolically 765.  She is given a dose of Lopressor here.  She is normally on carvedilol  Plan increase carvedilol to 6.5 in am.  Recheck with her doctor in 2-3 days.   Final Clinical Impressions(s) / ED Diagnoses   Final diagnoses:  Hypertension, unspecified type    ED Discharge Orders    None       Pattricia Boss, MD 01/28/18 [phone]

## 2018-01-28 NOTE — ED Notes (Signed)
MD in room at this time.

## 2018-01-28 NOTE — Discharge Instructions (Signed)
Increase your carvedilol to two tablets in the morning and recheck with your doctor 2-3 days.

## 2018-01-28 NOTE — ED Notes (Signed)
Katie - RN aware of pt's BP.

## 2018-01-28 NOTE — ED Triage Notes (Signed)
Patient presents to ED for assessment of hypertension.  States she has had episodes where she reads 160's, but tonight she noted to be 190s.  Patient 725'D systolic in triage.  C/o fatigue, generalized weakness, and intermittent dizziness.  No neuro deficits noted at triage.

## 2018-01-29 ENCOUNTER — Encounter: Payer: Self-pay | Admitting: Family Medicine

## 2018-01-30 ENCOUNTER — Ambulatory Visit (INDEPENDENT_AMBULATORY_CARE_PROVIDER_SITE_OTHER): Payer: Medicare Other | Admitting: Family Medicine

## 2018-01-30 VITALS — BP 136/74 | HR 67 | Temp 97.7°F | Ht 62.0 in | Wt 117.0 lb

## 2018-01-30 DIAGNOSIS — I10 Essential (primary) hypertension: Secondary | ICD-10-CM | POA: Diagnosis not present

## 2018-01-30 DIAGNOSIS — I493 Ventricular premature depolarization: Secondary | ICD-10-CM

## 2018-01-30 DIAGNOSIS — I341 Nonrheumatic mitral (valve) prolapse: Secondary | ICD-10-CM | POA: Diagnosis not present

## 2018-01-30 NOTE — Patient Instructions (Signed)
Continue your current medication regimen.  We will work to get you into see your cardiologist as soon as possible.

## 2018-01-30 NOTE — Progress Notes (Signed)
Bailey [name] is a 84 y.o. female is here for ER follow up.  History of Present Illness:  Bailey [name] CMA acting as scribe for Dr. Juleen China.  HPI: Patient comes in today for follow up from from ED for Hypertension. She went to ED after taking blood pressure multiple times at home and being over 694 systolic. She denies any chest pain, blurred vision, slurred speech, and head ache. She has been a little fatigued since before she went to the ED.   Patient of Dr. Johnsie Cancel with cardiology.  She has a history of mitral valve prolapse and PVCs with her hypertension.  She has been on carvedilol 3.125 mg p.o. twice daily with losartan 25 mg p.o. q. afternoon for quite some time.  Over the weekend, she felt fatigued and had a slight headache.  She did check her blood pressure and systolic blood pressures went to the 180s and low 90s.  She went to the emergency room and had a documented systolic blood pressure 854 per her report.  She was monitored and given Lopressor.  She was able to leave when her blood pressure went back down to 165/75.  Patient's heart rate has been maintained in the 60s-70s this entire time.  Patient was encouraged to start taking Coreg 6.25 mg in the morning, losartan 25 mg p.o. q. afternoon, and continue with Coreg 3.125 mg at night.  She has continue with this regimen for the last few days and monitor her blood pressures.  They have been labile, but range from 108/73 to 160/71 at the highest.  Her heart rate continues to stay in the 60s-70s.  She denies chest pain, shortness of breath, dizziness, headaches, edema.  She denies any other medication or diet changes.  She does take an Aleve in the morning for pain associated with Sjogren's syndrome.  This is not new.  She does have a recent history of a bulging disc diagnosis and has been undergoing physical therapy.  She will have a small amount of bourbon prior to dinner on occasion.  Review of Systems  Constitutional: Negative for chills  and fever.  HENT: Negative for congestion and ear pain.   Eyes: Negative for blurred vision and double vision.  Respiratory: Negative for cough.   Cardiovascular: Negative for chest pain and palpitations.  Gastrointestinal: Negative for nausea and vomiting.  Neurological: Negative for headaches.  Psychiatric/Behavioral: Negative for depression and suicidal ideas.   There are no preventive care reminders to display for this patient.   Depression screen [facility] 2/9 12/09/2017 10/25/2017 09/17/2016  Decreased Interest 0 0 0  Down, Depressed, Hopeless 0 0 0  PHQ - 2 Score 0 0 0   PMHx, SurgHx, SocialHx, FamHx, Medications, and Allergies were reviewed in the Visit Navigator and updated as appropriate.   Patient Active Problem List   Diagnosis Date Noted  . Allergy   . CKD (chronic kidney disease), stage III ([place]) 12/08/2017  . Moderate aortic regurgitation 12/08/2017  . Premature ventricular contractions   . Hepatitis   . History of skin cancer   . Sjogren's syndrome with keratoconjunctivitis sicca ([place]) 05/12/2017  . Balance disorder 10/26/2016  . Primary osteoarthritis of both feet 10/26/2016  . HTN (hypertension) 03/13/2015  . Anemia 03/13/2015  . Raynauds syndrome 10/21/2014  . Recurrent cold sores 10/21/2014  . Edema 06/29/2011  . Primary osteoarthritis of both hands 08/21/2009  . PVC (premature ventricular contraction) 06/04/2009  . Mitral valve prolapse 12/25/2008  . History of atrial fibrillation  09/05/2008  . Osteopenia 09/05/2008  . GERD 09/04/2008  . Irritable bowel syndrome 09/04/2008  . ALLERGY 09/04/2008  . COLONIC POLYPS 09/04/2008   Social History   Tobacco Use  . Smoking status: Former Smoker    Packs/day: 1.00    Years: 9.00    Pack years: 9.00    Types: Cigarettes    Last attempt to quit: 11/30/1963    Years since quitting: 54.2  . Smokeless tobacco: Never Used  Substance Use Topics  . Alcohol use: Yes    Alcohol/week: 4.2 oz    Types: 7 Glasses of  wine per week    Comment: generally drinks before dinner on the weekend   . Drug use: No   Current Medications and Allergies:   .  acyclovir (ZOVIRAX) 800 MG tablet, Take 1 tablet daily for 5 days as needed for outbreak., Disp: , Rfl:  .  aspirin 81 MG tablet, Take 81 mg by mouth daily., Disp: , Rfl:  .  carvedilol (COREG) 3.125 MG tablet, Take 1 tablet (3.125 mg total) by mouth 2 (two) times daily with a meal. Increase to two tablets in the morning, Disp: 180 tablet, Rfl: 2 .  Cholecalciferol (VITAMIN D) 400 UNITS capsule, Take 2 capsules by mouth daily , Disp: , Rfl:  .  cycloSPORINE (RESTASIS) 0.05 % ophthalmic emulsion, Place 1 drop into both eyes 2 (two) times daily. , Disp: , Rfl:  .  estrogen, conjugated,-medroxyprogesterone (PREMPRO) 0.45-1.5 MG tablet, Take 1 tablet by mouth daily., Disp: 28 tablet, Rfl: 12 .  fish oil-omega-3 fatty acids 1000 MG capsule, Take one capsule by mouth  daily, Disp: , Rfl:  .  losartan (COZAAR) 25 MG tablet, TAKE 1 TABLET BY MOUTH EVERY DAY, Disp: 90 tablet, Rfl: 1 .  naproxen sodium (ALEVE) 220 MG tablet, Take 220 mg by mouth 2 (two) times daily as needed. FOR GENERAL ARTHRITIS PAIN; PCP IS SLOWLY DECREASING IN HOPES TO STOP COMPLETELY, Disp: , Rfl:   Allergies  Allergen Reactions  . Diamox [Acetazolamide] Nausea Only  . Epinephrine-Lidocaine-Na Metabisulfite [Lidocaine-Epinephrine] Other (See Comments)    Atrial fibrillation  . Erythromycin Nausea Only  . Penicillins Other (See Comments)    Unsure of reaction  . Sulfonamide Derivatives Nausea Only and Other (See Comments)    Causes fever, kidney problems  . Tape Rash    Sensitive to skin   Review of Systems   Pertinent items are noted in the HPI. Otherwise, ROS is negative.  Vitals:   Vitals:   01/30/18 1519  BP: 136/74  Pulse: 67  Temp: 97.7 F (36.5 C)  TempSrc: Oral  SpO2: 98%  Weight: 117 lb (53.1 kg)  Height: 5\' 2"  (1.575 m)     Body mass index is 21.4 kg/m.  Physical Exam:    Physical Exam  Constitutional: She appears well-nourished.  HENT:  Head: Normocephalic and atraumatic.  Eyes: EOM are normal. Pupils are equal, round, and reactive to light.  Neck: Normal range of motion. Neck supple.  Cardiovascular: Normal rate, regular rhythm, normal heart sounds and intact distal pulses.  Pulmonary/Chest: Effort normal.  Abdominal: Soft.  Skin: Skin is warm.  Psychiatric: She has a normal mood and affect. Her behavior is normal.  Nursing note and vitals reviewed.   Results for orders placed or performed during the hospital encounter of 01/28/18  CBC  Result Value Ref Range   WBC 9.0 4.0 - 10.5 K/uL   RBC 3.95 3.87 - 5.11 MIL/uL   Hemoglobin 12.0  12.0 - 15.0 g/dL   HCT 37.6 36.0 - 46.0 %   MCV 95.2 78.0 - 100.0 fL   MCH 30.4 26.0 - 34.0 pg   MCHC 31.9 30.0 - 36.0 g/dL   RDW 13.1 11.5 - 15.5 %   Platelets 297 150 - 400 K/uL  Comprehensive metabolic panel  Result Value Ref Range   Sodium 136 135 - 145 mmol/L   Potassium 3.8 3.5 - 5.1 mmol/L   Chloride 100 (L) 101 - 111 mmol/L   CO2 24 22 - 32 mmol/L   Glucose, Bld 105 (H) 65 - 99 mg/dL   BUN 19 6 - 20 mg/dL   Creatinine, Ser 0.95 0.44 - 1.00 mg/dL   Calcium 9.4 8.9 - 10.3 mg/dL   Total Protein 7.4 6.5 - 8.1 g/dL   Albumin 3.8 3.5 - 5.0 g/dL   AST 18 15 - 41 U/L   ALT 15 14 - 54 U/L   Alkaline Phosphatase 51 38 - 126 U/L   Total Bilirubin 0.6 0.3 - 1.2 mg/dL   GFR calc non Af Amer 54 (L) >60 mL/min   GFR calc Af Amer >60 >60 mL/min   Anion gap 12 5 - 15   Assessment and Plan:   1. Essential hypertension 2. Premature ventricular contractions 3. Mitral valve prolapse  Okay to continue regimen for now since it is working.  We will see if we can get her in to see her cardiologist to see if this should be continued.  Red flags reviewed.  . Reviewed expectations re: course of current medical issues. . Discussed self-management of symptoms. . Outlined signs and symptoms indicating need for more  acute intervention. . Patient verbalized understanding and all questions were answered. Marland Kitchen Health Maintenance issues including appropriate healthy diet, exercise, and smoking avoidance were discussed with patient. . See orders for this visit as documented in the electronic medical record. . Patient received an After Visit Summary.    Briscoe Deutscher, DO [place], [facility] [place] 01/31/2018

## 2018-01-31 ENCOUNTER — Encounter: Payer: Self-pay | Admitting: Family Medicine

## 2018-01-31 ENCOUNTER — Telehealth: Payer: Self-pay | Admitting: Family Medicine

## 2018-01-31 NOTE — Telephone Encounter (Signed)
See note.   Copied from [place] [phone]. Topic: Quick Communication - See Telephone Encounter >> Jan 31, 2018  1:26 PM Bailey [name] wrote: Pt hasn't heard back from referral for Dr. Meredith Leeds. Please call pt back asap.  If pt isn't at home you can leave a message w/ her husband.

## 2018-02-01 ENCOUNTER — Telehealth: Payer: Self-pay

## 2018-02-01 MED ORDER — LOSARTAN POTASSIUM 50 MG PO TABS: 50.0000 mg | ORAL_TABLET | Freq: Every day | ORAL | 3 refills | Status: DC

## 2018-02-01 NOTE — Telephone Encounter (Signed)
-----   Message from Josue Hector, MD sent at 02/01/2018  3:42 PM EST ----- Seen in ER with increased BP increased losartan to 50 mg and coreg to 625 in am and 3.125 PM f/u HTN clinic next week and f/u with me in 4-6 weeks.

## 2018-02-01 NOTE — Telephone Encounter (Signed)
Called patient and made medication changes. Patient already has an appointment tomorrow with Dr. Johnsie Cancel. Per Dr. Johnsie Cancel, okay to keep appointment tomorrow.

## 2018-02-02 ENCOUNTER — Ambulatory Visit (INDEPENDENT_AMBULATORY_CARE_PROVIDER_SITE_OTHER): Payer: Medicare Other | Admitting: Cardiovascular Disease

## 2018-02-02 ENCOUNTER — Encounter: Payer: Self-pay | Admitting: Cardiovascular Disease

## 2018-02-02 VITALS — BP 152/78 | HR 68 | Ht 62.5 in | Wt 115.5 lb

## 2018-02-02 DIAGNOSIS — I10 Essential (primary) hypertension: Secondary | ICD-10-CM

## 2018-02-02 DIAGNOSIS — I351 Nonrheumatic aortic (valve) insufficiency: Secondary | ICD-10-CM

## 2018-02-02 DIAGNOSIS — I493 Ventricular premature depolarization: Secondary | ICD-10-CM

## 2018-02-02 NOTE — Patient Instructions (Signed)

## 2018-02-02 NOTE — Progress Notes (Signed)
Patient ID: KRISTEL DURKEE, female   DOB: [birth year], 84 y.o.   MRN: [id number]  84 y.o. history of palpitations with PVC;s, HTN, AR moderate no CAD with normal myovue 2017 EF 77%. Seen in ER with HTN urgency 02/01/18.  ARB and beta blocker increased Raynaud's' and Sjogren worse in cold weather   Last echo reviewed 12/06/17  Study Conclusions  - Left ventricle: The cavity size was normal. Systolic function was   normal. The estimated ejection fraction was in the range of 60%   to 65%. Wall motion was normal; there were no regional wall   motion abnormalities. Doppler parameters are consistent with   abnormal left ventricular relaxation (grade 1 diastolic   dysfunction). Doppler parameters are consistent with   indeterminate ventricular filling pressure. - Aortic valve: Transvalvular velocity was within the normal range.   There was no stenosis. There was moderate regurgitation. - Mitral valve: Very mild prolapse, involving the anterior leaflet. - Right ventricle: The cavity size was normal. Wall thickness was   normal. Systolic function was normal. - Atrial septum: No defect or patent foramen ovale was identified   by color flow Dopper. - Tricuspid valve: There was moderate regurgitation. - Pulmonary arteries: Systolic pressure was within the normal   range. PA peak pressure: 34 mm Hg (S).  ROS: Denies fever, malais, weight loss, blurry vision, decreased visual acuity, cough, sputum, SOB, hemoptysis, pleuritic pain, palpitaitons, heartburn, abdominal pain, melena, lower extremity edema, claudication, or rash.  All other systems reviewed and negative  General: Vitals:   02/02/18 1511  BP: (!) 152/78  Pulse: 68   Affect appropriate Healthy:  appears stated age 19: normal Neck supple with no adenopathy JVP normal no bruits no thyromegaly Lungs clear with no wheezing and good diaphragmatic motion Heart:  S1/S2 SEM and AR  murmur, no rub, gallop or click PMI normal Abdomen: benighn,  BS positve, no tenderness, no AAA no bruit.  No HSM or HJR Distal pulses intact with no bruits No edema Neuro non-focal Skin warm and dry No muscular weakness      Current Outpatient Medications  Medication Sig Dispense Refill  . acyclovir (ZOVIRAX) 800 MG tablet Take 1 tablet daily for 5 days as needed for outbreak.    Marland Kitchen aspirin 81 MG tablet Take 81 mg by mouth daily.    . carvedilol (COREG) 3.125 MG tablet Take 1 tablet (3.125 mg total) by mouth 2 (two) times daily with a meal. Increase to two tablets in the morning 180 tablet 2  . Cholecalciferol (VITAMIN D) 400 UNITS capsule Take 2 capsules by mouth daily     . cycloSPORINE (RESTASIS) 0.05 % ophthalmic emulsion Place 1 drop into both eyes 2 (two) times daily.     Marland Kitchen estrogen, conjugated,-medroxyprogesterone (PREMPRO) 0.45-1.5 MG tablet Take 1 tablet by mouth daily. 28 tablet 12  . fish oil-omega-3 fatty acids 1000 MG capsule Take one capsule by mouth  daily    . losartan (COZAAR) 50 MG tablet Take 1 tablet (50 mg total) by mouth daily. 90 tablet 3  . naproxen sodium (ALEVE) 220 MG tablet Take 220 mg by mouth 2 (two) times daily as needed. FOR GENERAL ARTHRITIS PAIN; PCP IS SLOWLY DECREASING IN HOPES TO STOP COMPLETELY     No current facility-administered medications for this visit.     Allergies  Diamox [acetazolamide]; Epinephrine-lidocaine-na metabisulfite [lidocaine-epinephrine]; Erythromycin; Penicillins; Sulfonamide derivatives; and Tape  Electrocardiogram:  09/04/14  SR rate 70  PVC otherwise normal  10/01/15  SR rate 69  Normal ECG  05/06/16 SR rate 62 LAD otherwise normal  06/06/17  NSR rate 67 nonspecific inferior ST changes   Assessment and Plan Palpitations:  PAC;s /PVC;s  Stable on beta blocker  AR:   Moderate by echo 12/06/17  LV compensated on ARB  HTN:  ARB and beta blocker increased home monitoring seems anxious can consider Adding procardia in future given Raynauds Edema:  Venous wearing compression stockings  improved f/u dermatology  Chest pain:  Improved normal myovue observe  Anxiety:  Seems more easily stressed about things Encouraged her to as primary about SSRI Husbands health is biggest stress   F/U  3 months   Jenkins Rouge

## 2018-02-03 DIAGNOSIS — M5416 Radiculopathy, lumbar region: Secondary | ICD-10-CM | POA: Diagnosis not present

## 2018-02-03 NOTE — Telephone Encounter (Signed)
Pt was seen by Dr. Hattie Perch office on 02/02/18

## 2018-02-07 DIAGNOSIS — M5416 Radiculopathy, lumbar region: Secondary | ICD-10-CM | POA: Diagnosis not present

## 2018-02-14 DIAGNOSIS — M5416 Radiculopathy, lumbar region: Secondary | ICD-10-CM | POA: Diagnosis not present

## 2018-02-18 IMAGING — NM NM MISC PROCEDURE
6 series · 36 of 36 positions shown · non-contrast
Comparison: none

[Series 1: wbr_s-proj_st stress-gsp · 6.40mm/px · 6 of 512 frames shown]
[frame 43/512]
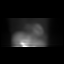
[frame 128/512]
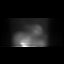
[frame 214/512]
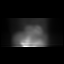
[frame 299/512]
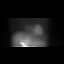
[frame 384/512]
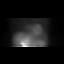
[frame 470/512]
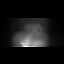

[Series 1: rest · 6.40mm/px · 6 of 64 frames shown]
[frame 6/64]
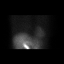
[frame 16/64]
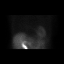
[frame 27/64]
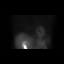
[frame 38/64]
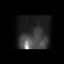
[frame 48/64]
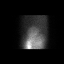
[frame 59/64]
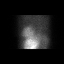

[Series 1: stress-gsp · 6.40mm/px · 6 of 512 frames shown]
[frame 43/512]
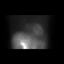
[frame 128/512]
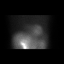
[frame 214/512]
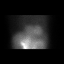
[frame 299/512]
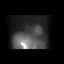
[frame 384/512]
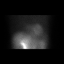
[frame 470/512]
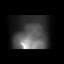

[Series 1: stress-sum-em · 6.40mm/px · 6 of 64 frames shown]
[frame 6/64]
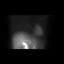
[frame 16/64]
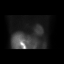
[frame 27/64]
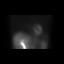
[frame 38/64]
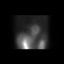
[frame 48/64]
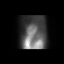
[frame 59/64]
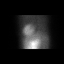

[Series 1: wbr_s-proj_st stress-sum-em · 6.40mm/px · 6 of 64 frames shown]
[frame 6/64]
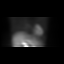
[frame 16/64]
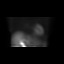
[frame 27/64]
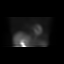
[frame 38/64]
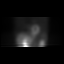
[frame 48/64]
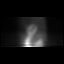
[frame 59/64]
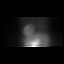

[Series 1: wbr_r-proj_st rest · 6.40mm/px · 6 of 64 frames shown]
[frame 6/64]
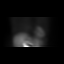
[frame 16/64]
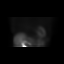
[frame 27/64]
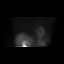
[frame 38/64]
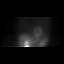
[frame 48/64]
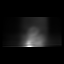
[frame 59/64]
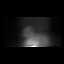

[36 of 36 positions shown; findings below may reference images not displayed]

Canned report from images found in remote index.

Refer to host system for actual result text.

## 2018-03-02 ENCOUNTER — Other Ambulatory Visit: Payer: Self-pay | Admitting: *Deleted

## 2018-03-02 MED ORDER — CARVEDILOL 3.125 MG PO TABS: ORAL_TABLET | ORAL | 3 refills | Status: DC

## 2018-03-02 NOTE — Telephone Encounter (Signed)
Patient called and stated that she is now supposed to be taking a total of three tablets of the carvedilol qd but her pharmacy will not refill it as it is to soon per insurance. [facility] informed her that they need a new rx reflecting the current sig. Please advise on how this should be ordered. Should it be sent in as two tablets in the AM and one tablet in the PM? Thanks, MI

## 2018-03-08 DIAGNOSIS — M5416 Radiculopathy, lumbar region: Secondary | ICD-10-CM | POA: Diagnosis not present

## 2018-04-17 DIAGNOSIS — L0109 Other impetigo: Secondary | ICD-10-CM | POA: Diagnosis not present

## 2018-04-17 DIAGNOSIS — L0101 Non-bullous impetigo: Secondary | ICD-10-CM | POA: Diagnosis not present

## 2018-04-17 DIAGNOSIS — Z85828 Personal history of other malignant neoplasm of skin: Secondary | ICD-10-CM | POA: Diagnosis not present

## 2018-05-02 NOTE — Progress Notes (Signed)
Office Visit Note  Patient: Bailey [name]             Date of Birth: 1933/10/16           MRN: [id number]             PCP: Marin Olp, MD Referring: Marin Olp, MD Visit Date: 05/16/2018 Occupation: @GUAROCC @    Subjective:  Lower back pain and dry mouth.   History of Present Illness: Bailey [name] is a 84 y.o. female history of Sjogren's and osteoarthritis.  She continues to have some sicca symptoms for which she has been using over-the-counter products and Restasis.  She has been having some discomfort in her bilateral shoulders.  She has history of right shoulder joint impingement syndrome.  She also has some arthritis in her hands and feet which causes chronic discomfort.  Her Raynauds is not as active during the summertime.  She states she had some lower back pain for which she was going to physical therapy and it helped her.  She was taking Aleve on a regular basis which she is using on PRN basis now.  She also had a lesion on her ankle for which she was seen a dermatologist.  She states she had a skin biopsy which showed lichen sclerosis.  She states that her dermatologist was not so convinced about the pathology report.  She tried cortisone cream which was helpful.  Activities of Daily Living:  Patient reports morning stiffness for 30 minutes.   Patient Reports nocturnal pain.  Difficulty dressing/grooming: Denies Difficulty climbing stairs: Denies Difficulty getting out of chair: Denies Difficulty using hands for taps, buttons, cutlery, and/or writing: Denies   Review of Systems  Constitutional: Positive for fatigue. Negative for night sweats, weight gain and weight loss.  HENT: Positive for mouth dryness. Negative for mouth sores, trouble swallowing, trouble swallowing and nose dryness.   Eyes: Positive for dryness. Negative for pain, redness and visual disturbance.  Respiratory: Negative for cough, shortness of breath and difficulty breathing.   Cardiovascular:  Negative for chest pain, palpitations, hypertension, irregular heartbeat and swelling in legs/feet.  Gastrointestinal: Negative for abdominal pain, blood in stool, constipation and diarrhea.  Endocrine: Negative for increased urination.  Genitourinary: Negative for pelvic pain and vaginal dryness.  Musculoskeletal: Positive for morning stiffness. Negative for arthralgias, joint pain, joint swelling, myalgias, muscle weakness, muscle tenderness and myalgias.  Skin: Positive for rash. Negative for color change, hair loss, skin tightness, ulcers and sensitivity to sunlight.  Allergic/Immunologic: Negative for susceptible to infections.  Neurological: Negative for dizziness, memory loss, night sweats and weakness.  Hematological: Positive for bruising/bleeding tendency. Negative for swollen glands.  Psychiatric/Behavioral: Negative for depressed mood and sleep disturbance. The patient is not nervous/anxious.     PMFS History:  Patient Active Problem List   Diagnosis Date Noted  . Allergy   . CKD (chronic kidney disease), stage III ([place]) 12/08/2017  . Moderate aortic regurgitation 12/08/2017  . Premature ventricular contractions   . Hepatitis   . History of skin cancer   . Sjogren's syndrome with keratoconjunctivitis sicca ([place]) 05/12/2017  . Balance disorder 10/26/2016  . Primary osteoarthritis of both feet 10/26/2016  . HTN (hypertension) 03/13/2015  . Anemia 03/13/2015  . Raynauds syndrome 10/21/2014  . Recurrent cold sores 10/21/2014  . Edema 06/29/2011  . Primary osteoarthritis of both hands 08/21/2009  . PVC (premature ventricular contraction) 06/04/2009  . Mitral valve prolapse 12/25/2008  . History of atrial fibrillation 09/05/2008  .  Osteopenia 09/05/2008  . GERD 09/04/2008  . Irritable bowel syndrome 09/04/2008  . ALLERGY 09/04/2008  . COLONIC POLYPS 09/04/2008    Past Medical History:  Diagnosis Date  . Allergy, unspecified not elsewhere classified   . Anemia,  unspecified   . Benign neoplasm of colon   . Cancer [facility])    basal/sqamous/transitional melanoma  . CKD (chronic kidney disease), stage III ([place]) 12/08/2017  . COLONIC POLYPS 09/04/2008   Was told by Gi does not need anymore.    . Diverticulosis of colon (without mention of hemorrhage)   . Dry eye syndrome   . Dysrhythmia    PVCs  . Esophageal reflux   . Hepatitis 1950s   infectious from food  . History of atrial fibrillation   . Hypertension   . Irritable bowel syndrome   . Mitral valve disorders(424.0)   . Moderate aortic regurgitation 12/08/2017  . Osteopenia 06/2016   T score -1.7 FRAX FRAX 11%/2.5% stable from prior DEXA  . Palpitations   . Premature ventricular contractions   . Rheumatoid arthritis(714.0)   . Sicca syndrome ([place])   . Ulcer 1992  . Vaginal delivery 1959, 1960, 1962, 1964    Family History  Problem Relation Age of Onset  . Stroke Mother   . Hypertension Mother   . Ulcerative colitis Father   . Heart attack Father   . Breast cancer Maternal Grandmother        Age 69's  . Uterine cancer Maternal Grandmother   . Hypertension Maternal Grandfather   . Colon cancer Paternal Grandmother    Past Surgical History:  Procedure Laterality Date  . APPENDECTOMY    . CATARACT EXTRACTION    . CHOLECYSTECTOMY    . colon tumor  1969   Benign rectal tumor excised  . DILATATION & CURETTAGE/HYSTEROSCOPY WITH MYOSURE N/A 01/21/2015   Procedure: DILATATION & CURETTAGE/HYSTEROSCOPY WITH MYOSURE;  Surgeon: [name], MD;  Location: [facility];  Service: Gynecology;  Laterality: N/A;  . HIATAL HERNIA REPAIR    . REFRACTIVE SURGERY    . ROTATOR CUFF REPAIR  2009   right by Dr. French Ana  . squamous cell removed  2013   Dr. Sherrye Payor --removed from her right cheek  . TUBAL LIGATION     Social History   Social History Narrative   Married 1958. 4 children. 10 grandkids (1 trying to go to medical school). No greatgrandkids.       Retired from nursing (medical and  surgical, most recently in cardiology)   Degree in history and english after finished nursing- 2 mo before 72th birthday      Hobbies: education, travel, reading, piano      Objective: Vital Signs: BP (!) 143/70 (BP Location: Left Arm, Patient Position: Sitting, Cuff Size: Normal)   Pulse 71   Resp 14   Ht 5\' 2"  (1.575 m)   Wt 116 lb (52.6 kg)   LMP  (LMP Unknown)   BMI 21.22 kg/m    Physical Exam  Constitutional: She is oriented to person, place, and time. She appears well-developed and well-nourished.  HENT:  Head: Normocephalic and atraumatic.  Eyes: Conjunctivae and EOM are normal.  Neck: Normal range of motion.  Cardiovascular: Normal rate, regular rhythm, normal heart sounds and intact distal pulses.  Pulmonary/Chest: Effort normal and breath sounds normal.  Abdominal: Soft. Bowel sounds are normal.  Lymphadenopathy:    She has no cervical adenopathy.  Neurological: She is alert and oriented to person, place, and time.  Skin: Skin is warm and dry. Capillary refill takes less than 2 seconds.  Psychiatric: She has a normal mood and affect. Her behavior is normal.  Nursing note and vitals reviewed.    Musculoskeletal Exam: C-spine thoracic spine good range of motion.  She has minimal discomfort range of motion of the lumbar spine.  Shoulder joints elbow joints wrist joints are good range of motion.  She has DIP PIP and CMC thickening bilaterally.  Hip joints and knee joints in good range of motion.  She has no tenderness on palpation over the joints.  CDAI Exam: No CDAI exam completed.    Investigation: No additional findings. CBC Latest Ref Rng & Units 01/28/2018 12/08/2017 05/16/2017  WBC 4.0 - 10.5 K/uL 9.0 10.1 10.4  Hemoglobin 12.0 - 15.0 g/dL 12.0 12.2 12.2  Hematocrit 36.0 - 46.0 % 37.6 37.1 37.6  Platelets 150 - 400 K/uL 297 330.0 341   CMP Latest Ref Rng & Units 01/28/2018 12/08/2017 05/16/2017  Glucose 65 - 99 mg/dL 105(H) 106(H) 89  BUN 6 - 20 mg/dL 19 20 19     Creatinine 0.44 - 1.00 mg/dL 0.95 0.94 0.97(H)  Sodium 135 - 145 mmol/L 136 138 139  Potassium 3.5 - 5.1 mmol/L 3.8 4.5 4.7  Chloride 101 - 111 mmol/L 100(L) 102 103  CO2 22 - 32 mmol/L 24 28 27   Calcium 8.9 - 10.3 mg/dL 9.4 9.3 9.7  Total Protein 6.5 - 8.1 g/dL 7.4 7.2 7.1  Total Bilirubin 0.3 - 1.2 mg/dL 0.6 0.3 0.4  Alkaline Phos 38 - 126 U/L 51 47 52  AST 15 - 41 U/L 18 17 16   ALT 14 - 54 U/L 15 12 11     Imaging: No results found.  Speciality Comments: No specialty comments available.    Procedures:  No procedures performed Allergies: Diamox [acetazolamide]; Epinephrine-lidocaine-na metabisulfite [lidocaine-epinephrine]; Erythromycin; Penicillins; Sulfonamide derivatives; and Tape   Assessment / Plan:     Visit Diagnoses: Sjogren's syndrome with keratoconjunctivitis sicca ([place]) -  -RF,-SPEP 11/17.  She has sicca symptoms which are tolerable with Restasis and over-the-counter products.  Raynaud's disease without gangrene-her Raynauds is not active currently.  Chronic midline low back pain without sciatica-she did much better after the physical therapy.  She still have some discomfort off and on in her lower back.  She takes Aleve on PRN basis.  Primary osteoarthritis of both hands-she has osteoarthritic changes in her hands with DIP and CMC thickening.  Joint protection muscle strengthening was discussed.  Primary osteoarthritis of both feet-she has been complaining of bilateral heel pain.  She had no tendinitis or fasciitis on examination.  She has no discomfort during the daytime she states her heels hurt only at bedtime.  Have advised her to use a pillow underneath her heels.  If her symptoms persist she should notify us.  Impingement syndrome of right shoulder-she has chronic discomfort.  Osteopenia of multiple sites-she is on calcium and vitamin D.  Chronic left SI joint pain -doing better after the physical therapy.  Balance disorder  History of  hypertension  History of atrial fibrillation - PVCs and Mitral valve prolapse    History of anemia  History of gastroesophageal reflux (GERD)  History of IBS    Orders: No orders of the defined types were placed in this encounter.  No orders of the defined types were placed in this encounter.   Face-to-face time spent with patient was 30 minutes. >50% of time was spent in counseling and coordination of care.  Follow-Up Instructions: Return in about 6 months (around 11/15/2018) for Sjogren's.   Bo Merino, MD  Note - This record has been created using Editor, commissioning.  Chart creation errors have been sought, but may not always  have been located. Such creation errors do not reflect on  the standard of medical care.

## 2018-05-03 DIAGNOSIS — Z85828 Personal history of other malignant neoplasm of skin: Secondary | ICD-10-CM | POA: Diagnosis not present

## 2018-05-03 DIAGNOSIS — D485 Neoplasm of uncertain behavior of skin: Secondary | ICD-10-CM | POA: Diagnosis not present

## 2018-05-03 DIAGNOSIS — L900 Lichen sclerosus et atrophicus: Secondary | ICD-10-CM | POA: Diagnosis not present

## 2018-05-04 NOTE — Progress Notes (Signed)
Patient ID: Bailey [name], female   DOB: [birth year], 84 y.o.   MRN: [id number]   84 y.o. history of palpitations , PVCls, HTN, moderate AR, atypical chest pain, Raynaud's and Sjogren's  TTE done 12/06/17 EF 60-65% moderate AR estimated PA pressure 34 mmHg Labile BP with ARB and  Beta blocker increased March 2019. Atypical chest pain with normal myovue in May 2017  Home BP readings fine. Has had dermatology issues with ? Lichen sclerosis on ankle Seeing Dr Durward Fortes Some blistering and exudative drainage after Rx with cortisone and antibiotics   ROS: Denies fever, malais, weight loss, blurry vision, decreased visual acuity, cough, sputum, SOB, hemoptysis, pleuritic pain, palpitaitons, heartburn, abdominal pain, melena, lower extremity edema, claudication, or rash.  All other systems reviewed and negative  General: Vitals:   05/15/18 0851  BP: (!) 142/64  Pulse: 67  SpO2: 98%   Affect appropriate Healthy:  appears stated age 63: normal Neck supple with no adenopathy JVP normal no bruits no thyromegaly Lungs clear with no wheezing and good diaphragmatic motion Heart:  S1/S2 SEM and AR  murmur, no rub, gallop or click PMI normal Abdomen: benighn, BS positve, no tenderness, no AAA no bruit.  No HSM or HJR Distal pulses intact with no bruits No edema Neuro non-focal Right ankle lichen sclerosis lesion  No muscular weakness      Current Outpatient Medications  Medication Sig Dispense Refill  . aspirin 81 MG tablet Take 81 mg by mouth daily.    Marland Kitchen augmented betamethasone dipropionate (DIPROLENE-AF) 0.05 % ointment Apply topically as directed.  0  . carvedilol (COREG) 3.125 MG tablet Take 6.25 mg (2 tablets) by mouth in the morning and take 3.125 mg (1 tablet) by mouth in the evening 270 tablet 3  . Cholecalciferol (VITAMIN D) 400 UNITS capsule Take 2 capsules by mouth daily     . cycloSPORINE (RESTASIS) 0.05 % ophthalmic emulsion Place 1 drop into both eyes 2 (two) times daily.      Marland Kitchen estrogen, conjugated,-medroxyprogesterone (PREMPRO) 0.45-1.5 MG tablet Take 1 tablet by mouth daily. 28 tablet 12  . fish oil-omega-3 fatty acids 1000 MG capsule Take one capsule by mouth  daily    . losartan (COZAAR) 50 MG tablet Take 1 tablet (50 mg total) by mouth daily. 90 tablet 3  . mupirocin ointment (BACTROBAN) 2 % Apply topically as directed.  0  . naproxen sodium (ALEVE) 220 MG tablet Take 220 mg by mouth 2 (two) times daily as needed. FOR GENERAL ARTHRITIS PAIN; PCP IS SLOWLY DECREASING IN HOPES TO STOP COMPLETELY     No current facility-administered medications for this visit.     Allergies  Diamox [acetazolamide]; Epinephrine-lidocaine-na metabisulfite [lidocaine-epinephrine]; Erythromycin; Penicillins; Sulfonamide derivatives; and Tape  Electrocardiogram:  09/04/14  SR rate 70  PVC otherwise normal  10/01/15  SR rate 69  Normal ECG  05/06/16 SR rate 62 LAD otherwise normal  06/06/17  NSR rate 67 nonspecific inferior ST changes   Assessment and Plan Palpitations:  PAC;s /PVC;s  Stable on beta blocker  AR:   Moderate by echo 12/06/17  LV compensated on ARB  HTN:  ARB and beta blocker increased March 2019  Edema:  Venous wearing compression stockings improved f/u dermatology  Chest pain:  Improved normal myovue 04/05/16  observe  Anxiety:  Seems more easily stressed about things Encouraged her to as primary about SSRI Husbands health is biggest stress  Derm:  Consider 2nd opinion regarding non healing lichen lesion on right ankle  F/U  6 months   Jenkins Rouge

## 2018-05-15 ENCOUNTER — Ambulatory Visit (INDEPENDENT_AMBULATORY_CARE_PROVIDER_SITE_OTHER): Payer: Medicare Other | Admitting: Cardiovascular Disease

## 2018-05-15 ENCOUNTER — Encounter: Payer: Self-pay | Admitting: Cardiovascular Disease

## 2018-05-15 VITALS — BP 142/64 | HR 67 | Ht 62.5 in | Wt 112.2 lb

## 2018-05-15 DIAGNOSIS — I351 Nonrheumatic aortic (valve) insufficiency: Secondary | ICD-10-CM

## 2018-05-15 DIAGNOSIS — Z961 Presence of intraocular lens: Secondary | ICD-10-CM | POA: Diagnosis not present

## 2018-05-15 DIAGNOSIS — H04123 Dry eye syndrome of bilateral lacrimal glands: Secondary | ICD-10-CM | POA: Diagnosis not present

## 2018-05-15 DIAGNOSIS — R002 Palpitations: Secondary | ICD-10-CM

## 2018-05-15 DIAGNOSIS — I10 Essential (primary) hypertension: Secondary | ICD-10-CM | POA: Diagnosis not present

## 2018-05-15 NOTE — Patient Instructions (Addendum)
Medication Instructions:  Your physician recommends that you continue on your current medications as directed. Please refer to the Current Medication list given to you today.  Labwork: NONE  Testing/Procedures: Your physician has requested that you have an echocardiogram in January. Echocardiography is a painless test that uses sound waves to create images of your heart. It provides your doctor with information about the size and shape of your heart and how well your heart's chambers and valves are working. This procedure takes approximately one hour. There are no restrictions for this procedure.  Follow-Up: Your physician wants you to follow-up in: January with Dr. Johnsie Cancel. You will receive a reminder letter in the mail two months in advance. If you don't receive a letter, please call our office to schedule the follow-up appointment.   If you need a refill on your cardiac medications before your next appointment, please call your pharmacy.

## 2018-05-16 ENCOUNTER — Ambulatory Visit (INDEPENDENT_AMBULATORY_CARE_PROVIDER_SITE_OTHER): Payer: Medicare Other | Admitting: Rheumatology

## 2018-05-16 ENCOUNTER — Encounter: Payer: Self-pay | Admitting: Rheumatology

## 2018-05-16 VITALS — BP 143/70 | HR 71 | Resp 14 | Ht 62.0 in | Wt 116.0 lb

## 2018-05-16 DIAGNOSIS — M7541 Impingement syndrome of right shoulder: Secondary | ICD-10-CM | POA: Diagnosis not present

## 2018-05-16 DIAGNOSIS — Z8679 Personal history of other diseases of the circulatory system: Secondary | ICD-10-CM | POA: Diagnosis not present

## 2018-05-16 DIAGNOSIS — M545 Low back pain, unspecified: Secondary | ICD-10-CM

## 2018-05-16 DIAGNOSIS — G8929 Other chronic pain: Secondary | ICD-10-CM

## 2018-05-16 DIAGNOSIS — M19071 Primary osteoarthritis, right ankle and foot: Secondary | ICD-10-CM

## 2018-05-16 DIAGNOSIS — M8589 Other specified disorders of bone density and structure, multiple sites: Secondary | ICD-10-CM | POA: Diagnosis not present

## 2018-05-16 DIAGNOSIS — M19072 Primary osteoarthritis, left ankle and foot: Secondary | ICD-10-CM

## 2018-05-16 DIAGNOSIS — Z8719 Personal history of other diseases of the digestive system: Secondary | ICD-10-CM | POA: Diagnosis not present

## 2018-05-16 DIAGNOSIS — R2689 Other abnormalities of gait and mobility: Secondary | ICD-10-CM | POA: Diagnosis not present

## 2018-05-16 DIAGNOSIS — M19041 Primary osteoarthritis, right hand: Secondary | ICD-10-CM | POA: Diagnosis not present

## 2018-05-16 DIAGNOSIS — M533 Sacrococcygeal disorders, not elsewhere classified: Secondary | ICD-10-CM

## 2018-05-16 DIAGNOSIS — M3501 Sicca syndrome with keratoconjunctivitis: Secondary | ICD-10-CM

## 2018-05-16 DIAGNOSIS — Z862 Personal history of diseases of the blood and blood-forming organs and certain disorders involving the immune mechanism: Secondary | ICD-10-CM

## 2018-05-16 DIAGNOSIS — I7300 Raynaud's syndrome without gangrene: Secondary | ICD-10-CM | POA: Diagnosis not present

## 2018-05-16 DIAGNOSIS — M19042 Primary osteoarthritis, left hand: Secondary | ICD-10-CM

## 2018-06-06 ENCOUNTER — Ambulatory Visit (INDEPENDENT_AMBULATORY_CARE_PROVIDER_SITE_OTHER): Payer: Medicare Other | Admitting: Family Medicine

## 2018-06-06 ENCOUNTER — Encounter: Payer: Self-pay | Admitting: Family Medicine

## 2018-06-06 DIAGNOSIS — N183 Chronic kidney disease, stage 3 unspecified: Secondary | ICD-10-CM

## 2018-06-06 DIAGNOSIS — I10 Essential (primary) hypertension: Secondary | ICD-10-CM | POA: Diagnosis not present

## 2018-06-06 DIAGNOSIS — K580 Irritable bowel syndrome with diarrhea: Secondary | ICD-10-CM | POA: Diagnosis not present

## 2018-06-06 LAB — BASIC METABOLIC PANEL
BUN: 16 mg/dL (ref 6–23)
CO2: 30 meq/L (ref 19–32)
Calcium: 9.8 mg/dL (ref 8.4–10.5)
Chloride: 103 mEq/L (ref 96–112)
Creatinine, Ser: 0.91 mg/dL (ref 0.40–1.20)
GFR: 62.78 mL/min (ref >60.00)
GLUCOSE: 116 mg/dL — AB (ref 70–99)
POTASSIUM: 4.7 meq/L (ref 3.5–5.1)
SODIUM: 139 meq/L (ref 135–145)

## 2018-06-06 NOTE — Patient Instructions (Addendum)
See fodmap handout. Try eliminating or cutting down on 1 group at a time to see if it helps your symptoms. Could also try as need imodium in the morning since that seems to be your toughest time with the IBS  No other changes today- blood pressure looks great!   Try to stick with the tylenol if you can- very sparing aleve if absolutely needed  Please stop by lab before you go

## 2018-06-06 NOTE — Progress Notes (Signed)
. Subjective:  Bailey [name] is a 84 y.o. year old very pleasant female patient who presents for/with See problem oriented charting ROS- No chest pain. No increased shortness of breath from baseline. No headache or blurry vision.  Still with fatigue  Past Medical History-  Patient Active Problem List   Diagnosis Date Noted  . Sjogren's syndrome with keratoconjunctivitis sicca ([place]) 05/12/2017    Priority: High  . History of atrial fibrillation 09/05/2008    Priority: High  . CKD (chronic kidney disease), stage III ([place]) 12/08/2017    Priority: Medium  . HTN (hypertension) 03/13/2015    Priority: Medium  . Raynauds syndrome 10/21/2014    Priority: Medium  . Primary osteoarthritis of both hands 08/21/2009    Priority: Medium  . PVC (premature ventricular contraction) 06/04/2009    Priority: Medium  . Mitral valve prolapse 12/25/2008    Priority: Medium  . Irritable bowel syndrome 09/04/2008    Priority: Medium  . Premature ventricular contractions     Priority: Low  . Hepatitis     Priority: Low  . History of skin cancer     Priority: Low  . Primary osteoarthritis of both feet 10/26/2016    Priority: Low  . Anemia 03/13/2015    Priority: Low  . 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 Edema 06/29/2011    Priority: Low  . Osteopenia 09/05/2008    Priority: Low  . GERD 09/04/2008    Priority: Low  . ALLERGY 09/04/2008    Priority: Low  . COLONIC POLYPS 09/04/2008    Priority: Low  . Allergy   . Moderate aortic regurgitation 12/08/2017  . Balance disorder 10/26/2016    Medications- reviewed and updated Current Outpatient Medications  Medication Sig Dispense Refill  . aspirin 81 MG tablet Take 81 mg by mouth daily.    Marland Kitchen augmented betamethasone dipropionate (DIPROLENE-AF) 0.05 % ointment Apply topically as directed.  0  . carvedilol (COREG) 3.125 MG tablet Take 6.25 mg (2 tablets) by mouth in the morning and take 3.125 mg (1 tablet) by mouth in the evening  270 tablet 3  . Cholecalciferol (VITAMIN D) 400 UNITS capsule Take 2 capsules by mouth daily     . cycloSPORINE (RESTASIS) 0.05 % ophthalmic emulsion Place 1 drop into both eyes 2 (two) times daily.     Marland Kitchen estrogen, conjugated,-medroxyprogesterone (PREMPRO) 0.45-1.5 MG tablet Take 1 tablet by mouth daily. 28 tablet 12  . fish oil-omega-3 fatty acids 1000 MG capsule Take one capsule by mouth  daily    . losartan (COZAAR) 50 MG tablet Take 1 tablet (50 mg total) by mouth daily. 90 tablet 3  . mupirocin ointment (BACTROBAN) 2 % Apply topically as directed.  0  . naproxen sodium (ALEVE) 220 MG tablet Take 220 mg by mouth 2 (two) times daily as needed. FOR GENERAL ARTHRITIS PAIN; PCP IS SLOWLY DECREASING IN HOPES TO STOP COMPLETELY     No current facility-administered medications for this visit.     Objective: BP 118/60 (BP Location: Left Arm, Patient Position: Sitting, Cuff Size: Normal)   Pulse 68   Temp 98.4 F (36.9 C) (Oral)   Ht 5\' 2"  (1.575 m)   Wt 113 lb 6.4 oz (51.4 kg)   LMP  (LMP Unknown)   SpO2 97%   BMI 20.74 kg/m  Gen: NAD, resting comfortably CV: RRR no murmurs rubs or gallops Lungs: CTAB no crackles, wheeze, rhonchi Abdomen: soft/nontender/nondistended/normal bowel sounds.  Ext: no  edema Skin: warm, dry GU: small papule in left groin that has scab on top  Assessment/Plan:  Other notes: 1.lichen sclerosus per biopsy right ankle- saw derm- they are not sure that it is accurate since it is just one lesion and not in groin lesion  2. Appears to be a healed hair follicle infection left groin  HTN (hypertension) S: controlled on losartan 50mg , coreg 6.5 mg in AM and 3.25 mg PM BP Readings from Last 3 Encounters:  06/06/18 118/60  05/16/18 (!) 143/70  05/15/18 (!) 142/64  A/P: We discussed blood pressure goal of <140/90. Continue current meds  Irritable bowel syndrome S:  a lot of issues with stomach recently. Has had issues since she was a child with sensitive  stomach. Easily irritated bowels. Worse with travel- day after stomach will not feel well- mild nausea and cramping in stomach in AM- has BM and improves after 1 or 2 BMs.  A/P: discussed imodium would be an option for her diarrhea predominant IBS- she will consider. She also just most of the time prefers to tolerate symptoms instead of taking new meds.    CKD (chronic kidney disease), stage III ([place]) S: have advised her to avoid nsaids- she gets relief for her back with aleve so this is hard for her. She had been on once a day for years and years. Did PT through rheumatology- this has helped her some  She had a hard time coming off this initially and had to use BID for a period, then when back to daily and now off the aleve. She is trying to use tylenol- some but not as significant of relief. Reports side effects with topical voltaren.   Last GFR 54 four months ago.  A/P: update GFR and try to continue to avoid aleve/other nsiads. GFR stays right around 60.    Future Appointments  Date Time Provider [place]  08/25/2018  2:30 PM Fontaine, Belinda Block, MD [facility]  11/14/2018  2:00 PM Bo Merino, MD [facility] None  12/04/2018 10:30 AM [id number]   Return in about 6 months (around 12/07/2018) for follow up- or sooner if needed.  Lab/Order associations: Essential hypertension - Plan: Basic metabolic panel  Irritable bowel syndrome with diarrhea  CKD (chronic kidney disease), stage III ([place])  Return precautions advised.  Garret Reddish, MD

## 2018-06-06 NOTE — Assessment & Plan Note (Signed)
S: controlled on losartan 50mg , coreg 6.5 mg in AM and 3.25 mg PM BP Readings from Last 3 Encounters:  06/06/18 118/60  05/16/18 (!) 143/70  05/15/18 (!) 142/64  A/P: We discussed blood pressure goal of <140/90. Continue current meds

## 2018-06-06 NOTE — Assessment & Plan Note (Signed)
S:  a lot of issues with stomach recently. Has had issues since she was a child with sensitive stomach. Easily irritated bowels. Worse with travel- day after stomach will not feel well- mild nausea and cramping in stomach in AM- has BM and improves after 1 or 2 BMs.  A/P: discussed imodium would be an option for her diarrhea predominant IBS- she will consider. She also just most of the time prefers to tolerate symptoms instead of taking new meds.

## 2018-06-06 NOTE — Assessment & Plan Note (Signed)
S: have advised her to avoid nsaids- she gets relief for her back with aleve so this is hard for her. She had been on once a day for years and years. Did PT through rheumatology- this has helped her some  She had a hard time coming off this initially and had to use BID for a period, then when back to daily and now off the aleve. She is trying to use tylenol- some but not as significant of relief. Reports side effects with topical voltaren.   Last GFR 54 four months ago.  A/P: update GFR and try to continue to avoid aleve/other nsiads. GFR stays right around 60.

## 2018-07-23 ENCOUNTER — Other Ambulatory Visit: Payer: Self-pay | Admitting: Gynecology

## 2018-07-24 ENCOUNTER — Encounter: Payer: Self-pay | Admitting: Family Medicine

## 2018-07-24 DIAGNOSIS — Z1231 Encounter for screening mammogram for malignant neoplasm of breast: Secondary | ICD-10-CM | POA: Diagnosis not present

## 2018-07-24 DIAGNOSIS — M8589 Other specified disorders of bone density and structure, multiple sites: Secondary | ICD-10-CM | POA: Diagnosis not present

## 2018-07-24 LAB — HM DEXA SCAN

## 2018-07-24 LAB — HM MAMMOGRAPHY

## 2018-07-24 NOTE — Telephone Encounter (Signed)
Annual scheduled on 08/25/18

## 2018-07-26 ENCOUNTER — Encounter: Payer: Self-pay | Admitting: Gynecology

## 2018-07-27 ENCOUNTER — Encounter: Payer: Self-pay | Admitting: Gynecology

## 2018-07-27 ENCOUNTER — Encounter: Payer: Self-pay | Admitting: Family Medicine

## 2018-08-13 ENCOUNTER — Other Ambulatory Visit: Payer: Self-pay | Admitting: Gynecology

## 2018-08-14 NOTE — Telephone Encounter (Signed)
Annual exam scheduled on 08/25/18

## 2018-08-24 ENCOUNTER — Other Ambulatory Visit: Payer: Self-pay | Admitting: Cardiovascular Disease

## 2018-08-25 ENCOUNTER — Ambulatory Visit (INDEPENDENT_AMBULATORY_CARE_PROVIDER_SITE_OTHER): Payer: Medicare Other | Admitting: Gynecology

## 2018-08-25 ENCOUNTER — Encounter: Payer: Self-pay | Admitting: Gynecology

## 2018-08-25 VITALS — BP 124/74 | Ht 62.0 in | Wt 115.0 lb

## 2018-08-25 DIAGNOSIS — N952 Postmenopausal atrophic vaginitis: Secondary | ICD-10-CM | POA: Diagnosis not present

## 2018-08-25 DIAGNOSIS — N898 Other specified noninflammatory disorders of vagina: Secondary | ICD-10-CM

## 2018-08-25 DIAGNOSIS — Z01411 Encounter for gynecological examination (general) (routine) with abnormal findings: Secondary | ICD-10-CM

## 2018-08-25 DIAGNOSIS — M8580 Other specified disorders of bone density and structure, unspecified site: Secondary | ICD-10-CM | POA: Diagnosis not present

## 2018-08-25 LAB — WET PREP FOR TRICH, YEAST, CLUE

## 2018-08-25 NOTE — Progress Notes (Signed)
    Bailey [name] 08/01/1933 [id number]        85 y.o.  [id number] for breast and pelvic exam.  Notes a slight yellow discharge.  Not sure if rectal or vaginal.  No bleeding.  No pelvic discomfort cramping or any pain at all.  Is having issues with fecal incontinence.  No urinary symptoms such as frequency dysuria urgency low back pain fever or chills.  Past medical history,surgical history, problem list, medications, allergies, family history and social history were all reviewed and documented as reviewed in the EPIC chart.  ROS:  Performed with pertinent positives and negatives included in the history, assessment and plan.   Additional significant findings : None   Exam: [name] assistant Vitals:   08/25/18 1422  BP: 124/74  Weight: 115 lb (52.2 kg)  Height: 5\' 2"  (1.575 m)   Body mass index is 21.03 kg/m.  General appearance:  Normal affect, orientation and appearance. Skin: Grossly normal HEENT: Without gross lesions.  No cervical or supraclavicular adenopathy. Thyroid normal.  Lungs:  Clear without wheezing, rales or rhonchi Cardiac: RR, without RMG Abdominal:  Soft, nontender, without masses, guarding, rebound, organomegaly or hernia Breasts:  Examined lying and sitting without masses, retractions, discharge or axillary adenopathy. Pelvic:  Ext, BUS, Vagina: With atrophic changes.  Scant white discharge  Cervix: With atrophic changes  Uterus: Anteverted, normal size, shape and contour, midline and mobile nontender   Adnexa: Without masses or tenderness    Anus and perineum: Normal   Rectovaginal: Normal sphincter tone without palpated masses or tenderness.    Assessment/Plan:  84 y.o. [id number] female for breast and pelvic exam.   1. Postmenopausal/atrophic genital changes.  Continues on Prempro 0.45/1.5.  Feels well and wants to continue.  We have discussed the risks particularly with advancing age to include stroke heart attack DVT in the breast cancer issue.  The patient  feels very strongly that she wants to continue from a quality of life standpoint.  Refill x1 year provided. 2. Complaints of slight vaginal discharge although on questioning unclear whether it is from the rectum.  Regardless wet prep was negative.  She has no evidence of irritation or inflammatory changes.  She is otherwise asymptomatic I recommended just to observe for now she is comfortable with this.  She knows to call if ever bloody or other symptoms develop such as itching or irritation. 3. Osteopenia.  DEXA 2019 T score -1.8 stable from prior DEXA. 4. Pap smear 2012.  No Pap smear done today.  No history of significant abnormal Pap smear.  We both agree to stop screening per current screening guidelines based on age. 5. Colonoscopy 2013.  Repeat at their recommended interval. 6. Mammography 06/2018.  Continue with annual mammography next year.  Breast exam normal today. 7. Health maintenance.  No routine lab work done as patient does this elsewhere.  Follow-up 1 year, sooner as needed.   [name] MD, 3:12 PM 08/25/2018

## 2018-08-25 NOTE — Patient Instructions (Signed)
Follow-up in 1 year for annual exam, sooner as needed. 

## 2018-08-25 NOTE — Addendum Note (Signed)
Addended by: Nelva Nay on: 08/25/2018 03:36 PM   Modules accepted: Orders

## 2018-08-30 ENCOUNTER — Ambulatory Visit (INDEPENDENT_AMBULATORY_CARE_PROVIDER_SITE_OTHER): Payer: Medicare Other

## 2018-08-30 DIAGNOSIS — Z23 Encounter for immunization: Secondary | ICD-10-CM | POA: Diagnosis not present

## 2018-08-30 NOTE — Patient Instructions (Signed)
There are no preventive care reminders to display for this patient.  Depression screen [facility] 2/9 12/09/2017 10/25/2017 09/17/2016  Decreased Interest 0 0 0  Down, Depressed, Hopeless 0 0 0  PHQ - 2 Score 0 0 0

## 2018-08-30 NOTE — Progress Notes (Signed)
Patient here for Flu Vaccine. VIS given. Administered in left arm. Patient tolerated well

## 2018-08-31 ENCOUNTER — Other Ambulatory Visit: Payer: Self-pay | Admitting: Cardiovascular Disease

## 2018-09-04 ENCOUNTER — Other Ambulatory Visit: Payer: Self-pay | Admitting: Gynecology

## 2018-10-30 ENCOUNTER — Other Ambulatory Visit: Payer: Self-pay | Admitting: Cardiovascular Disease

## 2018-10-30 MED ORDER — CARVEDILOL 3.125 MG PO TABS: ORAL_TABLET | ORAL | 1 refills | Status: DC

## 2018-10-31 NOTE — Progress Notes (Signed)
Office Visit Note  Patient: Bailey [name]             Date of Birth: [birth year]           MRN: [id number]             PCP: Marin Olp, MD Referring: Marin Olp, MD Visit Date: 11/14/2018 Occupation: @GUAROCC @  Subjective:  Fatigue   History of Present Illness: Bailey [name] is a 84 y.o. female with history of Sjogren's and osteoarthritis.  She continues to have sicca symptoms.  She uses Restasis eyedrops twice daily as well as Biotene products twice daily.  She continues to go to the dentist every 6 months and has not had any recent dental caries.  She has chronic lower back pain.  She went to physical therapy in the past for 6 weeks which provided significant relief.  She continues to perform the stretching exercises on a regular basis.  She states that she is no longer taking Aleve.  She denies any rating pain or numbness at this time.  She takes Tylenol occasionally  She reports that she has been having worsening fatigue recently.  She states that she has been sleeping well at night.  She is concerned about her fatigue.  She reports that she continues to have intermittent symptoms of Raynaud's in the right middle and ring finger.  She denies any digital ulcerations.  She wears gloves on a regular basis.   Activities of Daily Living:  Patient reports morning stiffness for 30  minutes.   Patient Reports nocturnal pain.  Difficulty dressing/grooming: Denies Difficulty climbing stairs: Denies Difficulty getting out of chair: Denies Difficulty using hands for taps, buttons, cutlery, and/or writing: Denies  Review of Systems  Constitutional: Positive for fatigue.  HENT: Positive for mouth dryness. Negative for mouth sores and nose dryness.   Eyes: Positive for dryness. Negative for pain and visual disturbance.  Respiratory: Negative for cough, hemoptysis, shortness of breath and difficulty breathing.   Cardiovascular: Positive for palpitations (Hx of MVP). Negative for chest  pain, hypertension and swelling in legs/feet.  Gastrointestinal: Positive for constipation (Hx of IBS) and diarrhea. Negative for blood in stool.  Endocrine: Negative for increased urination.  Genitourinary: Negative for painful urination.  Musculoskeletal: Positive for arthralgias, joint pain and morning stiffness. Negative for joint swelling, myalgias, muscle weakness, muscle tenderness and myalgias.  Skin: Negative for color change, pallor, rash, hair loss, nodules/bumps, skin tightness, ulcers and sensitivity to sunlight.  Allergic/Immunologic: Negative for susceptible to infections.  Neurological: Negative for dizziness, numbness, headaches and weakness.  Hematological: Negative for swollen glands.  Psychiatric/Behavioral: Negative for depressed mood and sleep disturbance. The patient is not nervous/anxious.     PMFS History:  Patient Active Problem List   Diagnosis Date Noted  . Allergy   . CKD (chronic kidney disease), stage III ([place]) 12/08/2017  . Moderate aortic regurgitation 12/08/2017  . Premature ventricular contractions   . Hepatitis   . History of skin cancer   . Sjogren's syndrome with keratoconjunctivitis sicca ([place]) 05/12/2017  . Balance disorder 10/26/2016  . Primary osteoarthritis of both feet 10/26/2016  . HTN (hypertension) 03/13/2015  . Anemia 03/13/2015  . Raynauds syndrome 10/21/2014  . Recurrent cold sores 10/21/2014  . Edema 06/29/2011  . Primary osteoarthritis of both hands 08/21/2009  . PVC (premature ventricular contraction) 06/04/2009  . Mitral valve prolapse 12/25/2008  . History of atrial fibrillation 09/05/2008  . Osteopenia 09/05/2008  . GERD 09/04/2008  .  Irritable bowel syndrome 09/04/2008  . ALLERGY 09/04/2008  . COLONIC POLYPS 09/04/2008    Past Medical History:  Diagnosis Date  . Allergy, unspecified not elsewhere classified   . Anemia, unspecified   . Benign neoplasm of colon   . Cancer [facility])    basal/sqamous/transitional melanoma    . CKD (chronic kidney disease), stage III ([place]) 12/08/2017  . COLONIC POLYPS 09/04/2008   Was told by Gi does not need anymore.    . Diverticulosis of colon (without mention of hemorrhage)   . Dry eye syndrome   . Dysrhythmia    PVCs  . Esophageal reflux   . Hepatitis 1950s   infectious from food  . History of atrial fibrillation   . Hypertension   . Irritable bowel syndrome   . Mitral valve disorders(424.0)   . Moderate aortic regurgitation 12/08/2017  . Osteopenia 06/2018   T score -1.8 FRAX 10% / 2.5%  . Palpitations   . Premature ventricular contractions   . Rheumatoid arthritis(714.0)   . Sicca syndrome ([place])   . Ulcer 1992  . Vaginal delivery 1959, 1960, 1962, 1964    Family History  Problem Relation Age of Onset  . Stroke Mother   . Hypertension Mother   . Ulcerative colitis Father   . Heart attack Father   . Breast cancer Maternal Grandmother        Age 26's  . Uterine cancer Maternal Grandmother   . Hypertension Maternal Grandfather   . Colon cancer Paternal Grandmother    Past Surgical History:  Procedure Laterality Date  . APPENDECTOMY    . CATARACT EXTRACTION    . CHOLECYSTECTOMY    . colon tumor  1969   Benign rectal tumor excised  . DILATATION & CURETTAGE/HYSTEROSCOPY WITH MYOSURE N/A 01/21/2015   Procedure: DILATATION & CURETTAGE/HYSTEROSCOPY WITH MYOSURE;  Surgeon: [name], MD;  Location: [facility];  Service: Gynecology;  Laterality: N/A;  . HIATAL HERNIA REPAIR    . REFRACTIVE SURGERY    . ROTATOR CUFF REPAIR  2009   right by Dr. French Ana  . squamous cell removed  2013   Dr. Sherrye Payor --removed from her right cheek  . TUBAL LIGATION     Social History   Social History Narrative   Married 1958. 4 children. 10 grandkids (1 trying to go to medical school). No greatgrandkids.       Retired from nursing (medical and surgical, most recently in cardiology)   Degree in history and english after finished nursing- 2 mo before 72th birthday       Hobbies: education, travel, reading, piano     Objective: Vital Signs: BP (!) 148/69 (BP Location: Left Arm, Patient Position: Sitting, Cuff Size: Normal)   Pulse 68   Resp 12   Ht 5\' 2"  (1.575 m)   Wt 117 lb (53.1 kg)   LMP  (LMP Unknown)   BMI 21.40 kg/m    Physical Exam Vitals signs and nursing note reviewed.  Constitutional:      Appearance: She is well-developed.  HENT:     Head: Normocephalic and atraumatic.  Eyes:     Conjunctiva/sclera: Conjunctivae normal.  Neck:     Musculoskeletal: Normal range of motion.  Cardiovascular:     Rate and Rhythm: Normal rate and regular rhythm.     Heart sounds: Normal heart sounds.  Pulmonary:     Effort: Pulmonary effort is normal.     Breath sounds: Normal breath sounds.  Abdominal:  General: Bowel sounds are normal.     Palpations: Abdomen is soft.  Lymphadenopathy:     Cervical: No cervical adenopathy.  Skin:    General: Skin is warm and dry.     Capillary Refill: Capillary refill takes less than 2 seconds.  Neurological:     Mental Status: She is alert and oriented to person, place, and time.  Psychiatric:        Behavior: Behavior normal.      Musculoskeletal Exam: C-spine good range of motion.  She is able to range of motion of the lumbar spine with some discomfort.  She has midline spinal tenderness in the lumbar region.  Has bilateral SI joint tenderness.  Shoulder joints, elbow joints, wrist joints, MCPs and PIPs and DIPs good range of motion with no synovitis.  She has bilateral CMC joint synovial thickening consistent with osteoarthritis of bilateral hands.  She has PIP and DIP synovial thickening.  Hip joints, knee joints, ankle joints, MTPs and PIPs, DIPs good range of motion with no synovitis.  No warmth or effusion of bilateral knee joints.  No tenderness or swelling of ankle joints.  No tenderness of trochanter bursa bilaterally.  CDAI Exam: CDAI Score: Not documented Patient Global Assessment: Not  documented; Provider Global Assessment: Not documented Swollen: Not documented; Tender: Not documented Joint Exam   Not documented   There is currently no information documented on the homunculus. Go to the Rheumatology activity and complete the homunculus joint exam.  Investigation: No additional findings.  Imaging: No results found.  Recent Labs: Lab Results  Component Value Date   WBC 9.0 01/28/2018   HGB 12.0 01/28/2018   PLT 297 01/28/2018   NA 139 06/06/2018   K 4.7 06/06/2018   CL 103 06/06/2018   CO2 30 06/06/2018   GLUCOSE 116 (H) 06/06/2018   BUN 16 06/06/2018   CREATININE 0.91 06/06/2018   BILITOT 0.6 01/28/2018   ALKPHOS 51 01/28/2018   AST 18 01/28/2018   ALT 15 01/28/2018   PROT 7.4 01/28/2018   ALBUMIN 3.8 01/28/2018   CALCIUM 9.8 06/06/2018   GFRAA >60 01/28/2018    Speciality Comments: No specialty comments available.  Procedures:  No procedures performed Allergies: Diamox [acetazolamide]; Epinephrine-lidocaine-na metabisulfite [lidocaine-epinephrine]; Erythromycin; Penicillins; Sulfonamide derivatives; and Tape   Assessment / Plan:     Visit Diagnoses: Sjogren's syndrome with keratoconjunctivitis sicca ([place]) - RF,-SPEP 11/17: She continues to have sicca symptoms.  She uses Biotene products twice daily as well as Restasis eyedrops twice daily.  She has no parotid swelling on exam today.  She continues to go to the dentist every 6 months and has not had any recent dental caries.  CBC, CMP, and UA will be checked today.  She will continue using over-the-counter products.   - Plan: COMPLETE METABOLIC PANEL WITH GFR, CBC with Differential/Platelet, Urinalysis, Routine w reflex microscopic  Raynaud's disease without gangrene: She has intermittent symptoms of Raynaud's in her right middle and ring finger.  No digital ulcerations or signs of gangrene were noted.  She wears gloves on a regular basis  Chronic midline low back pain without sciatica -She has  midline spinal tenderness in lumbar region as well as bilateral SI joints.  She previously went to physical therapy for 6 weeks which provided significant relief.  She continues to perform stretching exercises on a regular basis.  She is no longer taking Aleve.   Primary osteoarthritis of both hands: She has PIP, DIP, and bilateral CMC joint synovial  thickening consistent with osteoarthritis of bilateral hands.  She has no synovitis on exam.  She has complete fist formation bilaterally.  Joint protection and muscle strengthening were discussed.  Primary osteoarthritis of both feet: She has PIP and DIP synovial thickening consistent with osteoarthritis of bilateral feet.  She has no tenderness on exam.  She wears proper fitting shoes.  Impingement syndrome of right shoulder: She has good range of motion on exam with no discomfort.  Osteopenia of multiple sites: DEXA 07/26/18 L femoral neck T-score -1.8.  She takes a vitamin D supplement on a daily basis.  We will check a vitamin D level today.  Chronic left SI joint pain: She has bilateral SI joint tenderness on exam today.  She declined a cortisone injection.  She is planning on performing stretching exercises on a daily basis.  Other fatigue -She has been having worsening fatigue recently.  We will check the following labs today.  Plan: TSH, Vitamin B12, COMPLETE METABOLIC PANEL WITH GFR, CBC with Differential/Platelet, CK, VITAMIN D 25 Hydroxy (Vit-D Deficiency, Fractures)   Other medical conditions are listed as follows:  Balance disorder  History of hypertension  History of atrial fibrillation  History of anemia  History of gastroesophageal reflux (GERD)  History of IBS  Orders: Orders Placed This Encounter  Procedures  . TSH  . Vitamin B12  . COMPLETE METABOLIC PANEL WITH GFR  . CBC with Differential/Platelet  . Urinalysis, Routine w reflex microscopic  . CK  . VITAMIN D 25 Hydroxy (Vit-D Deficiency, Fractures)   No orders  of the defined types were placed in this encounter.   Face-to-face time spent with patient was 30 minutes. Greater than 50% of time was spent in counseling and coordination of care.  Follow-Up Instructions: No follow-ups on file.   Ofilia Neas, PA-C  Note - This record has been created using Dragon software.  Chart creation errors have been sought, but may not always  have been located. Such creation errors do not reflect on  the standard of medical care.

## 2018-11-13 DIAGNOSIS — D1801 Hemangioma of skin and subcutaneous tissue: Secondary | ICD-10-CM | POA: Diagnosis not present

## 2018-11-13 DIAGNOSIS — Z85828 Personal history of other malignant neoplasm of skin: Secondary | ICD-10-CM | POA: Diagnosis not present

## 2018-11-13 DIAGNOSIS — L821 Other seborrheic keratosis: Secondary | ICD-10-CM | POA: Diagnosis not present

## 2018-11-13 DIAGNOSIS — D2272 Melanocytic nevi of left lower limb, including hip: Secondary | ICD-10-CM | POA: Diagnosis not present

## 2018-11-13 DIAGNOSIS — L570 Actinic keratosis: Secondary | ICD-10-CM | POA: Diagnosis not present

## 2018-11-13 DIAGNOSIS — D225 Melanocytic nevi of trunk: Secondary | ICD-10-CM | POA: Diagnosis not present

## 2018-11-13 DIAGNOSIS — L603 Nail dystrophy: Secondary | ICD-10-CM | POA: Diagnosis not present

## 2018-11-13 DIAGNOSIS — D2271 Melanocytic nevi of right lower limb, including hip: Secondary | ICD-10-CM | POA: Diagnosis not present

## 2018-11-14 ENCOUNTER — Ambulatory Visit: Payer: Medicare Other | Admitting: Rheumatology

## 2018-11-14 ENCOUNTER — Encounter: Payer: Self-pay | Admitting: Physician Assistant

## 2018-11-14 ENCOUNTER — Ambulatory Visit (INDEPENDENT_AMBULATORY_CARE_PROVIDER_SITE_OTHER): Payer: Medicare Other | Admitting: Physician Assistant

## 2018-11-14 VITALS — BP 148/69 | HR 68 | Resp 12 | Ht 62.0 in | Wt 117.0 lb

## 2018-11-14 DIAGNOSIS — Z862 Personal history of diseases of the blood and blood-forming organs and certain disorders involving the immune mechanism: Secondary | ICD-10-CM | POA: Diagnosis not present

## 2018-11-14 DIAGNOSIS — R5383 Other fatigue: Secondary | ICD-10-CM | POA: Diagnosis not present

## 2018-11-14 DIAGNOSIS — M19072 Primary osteoarthritis, left ankle and foot: Secondary | ICD-10-CM

## 2018-11-14 DIAGNOSIS — M545 Low back pain, unspecified: Secondary | ICD-10-CM

## 2018-11-14 DIAGNOSIS — I7300 Raynaud's syndrome without gangrene: Secondary | ICD-10-CM

## 2018-11-14 DIAGNOSIS — Z8679 Personal history of other diseases of the circulatory system: Secondary | ICD-10-CM

## 2018-11-14 DIAGNOSIS — M7541 Impingement syndrome of right shoulder: Secondary | ICD-10-CM

## 2018-11-14 DIAGNOSIS — R2689 Other abnormalities of gait and mobility: Secondary | ICD-10-CM | POA: Diagnosis not present

## 2018-11-14 DIAGNOSIS — Z8719 Personal history of other diseases of the digestive system: Secondary | ICD-10-CM

## 2018-11-14 DIAGNOSIS — M533 Sacrococcygeal disorders, not elsewhere classified: Secondary | ICD-10-CM | POA: Diagnosis not present

## 2018-11-14 DIAGNOSIS — G8929 Other chronic pain: Secondary | ICD-10-CM

## 2018-11-14 DIAGNOSIS — M19071 Primary osteoarthritis, right ankle and foot: Secondary | ICD-10-CM | POA: Diagnosis not present

## 2018-11-14 DIAGNOSIS — M19041 Primary osteoarthritis, right hand: Secondary | ICD-10-CM | POA: Diagnosis not present

## 2018-11-14 DIAGNOSIS — M3501 Sicca syndrome with keratoconjunctivitis: Secondary | ICD-10-CM

## 2018-11-14 DIAGNOSIS — M8589 Other specified disorders of bone density and structure, multiple sites: Secondary | ICD-10-CM | POA: Diagnosis not present

## 2018-11-14 DIAGNOSIS — M19042 Primary osteoarthritis, left hand: Secondary | ICD-10-CM

## 2018-11-15 LAB — VITAMIN D 25 HYDROXY (VIT D DEFICIENCY, FRACTURES): VIT D 25 HYDROXY: 42 ng/mL (ref 30–100)

## 2018-11-15 LAB — URINALYSIS, ROUTINE W REFLEX MICROSCOPIC
Bilirubin Urine: NEGATIVE
Glucose, UA: NEGATIVE
Hgb urine dipstick: NEGATIVE
KETONES UR: NEGATIVE
Leukocytes, UA: NEGATIVE
NITRITE: NEGATIVE
Protein, ur: NEGATIVE
SPECIFIC GRAVITY, URINE: 1.024 (ref 1.001–1.03)

## 2018-11-15 LAB — CBC WITH DIFFERENTIAL/PLATELET
ABSOLUTE MONOCYTES: 1092 {cells}/uL — AB (ref 200–950)
Basophils Absolute: 62 cells/uL (ref 0–200)
Basophils Relative: 0.6 %
Eosinophils Absolute: 175 cells/uL (ref 15–500)
Eosinophils Relative: 1.7 %
HEMATOCRIT: 35.8 % (ref 35.0–45.0)
Hemoglobin: 12 g/dL (ref 11.7–15.5)
LYMPHS ABS: 2843 {cells}/uL (ref 850–3900)
MCH: 30.6 pg (ref 27.0–33.0)
MCHC: 33.5 g/dL (ref 32.0–36.0)
MCV: 91.3 fL (ref 80.0–100.0)
MPV: 10.8 fL (ref 7.5–12.5)
Monocytes Relative: 10.6 %
Neutro Abs: 6129 cells/uL (ref 1500–7800)
Neutrophils Relative %: 59.5 %
PLATELETS: 361 10*3/uL (ref 140–400)
RBC: 3.92 10*6/uL (ref 3.80–5.10)
RDW: 11.6 % (ref 11.0–15.0)
TOTAL LYMPHOCYTE: 27.6 %
WBC: 10.3 10*3/uL (ref 3.8–10.8)

## 2018-11-15 LAB — COMPLETE METABOLIC PANEL WITH GFR
AG Ratio: 1.3 (calc) (ref 1.0–2.5)
ALT: 11 U/L (ref 6–29)
AST: 14 U/L (ref 10–35)
Albumin: 4 g/dL (ref 3.6–5.1)
Alkaline phosphatase (APISO): 58 U/L (ref 33–130)
BUN/Creatinine Ratio: 17 (calc) (ref 6–22)
BUN: 17 mg/dL (ref 7–25)
CALCIUM: 9.9 mg/dL (ref 8.6–10.4)
CO2: 31 mmol/L (ref 20–32)
CREATININE: 0.99 mg/dL — AB (ref 0.60–0.88)
Chloride: 102 mmol/L (ref 98–110)
GFR, EST NON AFRICAN AMERICAN: 53 mL/min/{1.73_m2} — AB (ref >=60)
GFR, Est African American: 61 mL/min/{1.73_m2} (ref >=60)
GLOBULIN: 3.2 g/dL (ref 1.9–3.7)
Glucose, Bld: 82 mg/dL (ref 65–99)
Potassium: 3.7 mmol/L (ref 3.5–5.3)
SODIUM: 140 mmol/L (ref 135–146)
TOTAL PROTEIN: 7.2 g/dL (ref 6.1–8.1)
Total Bilirubin: 0.3 mg/dL (ref 0.2–1.2)

## 2018-11-15 LAB — CK: Total CK: 56 U/L (ref 29–143)

## 2018-11-15 LAB — TSH: TSH: 3.75 m[IU]/L (ref 0.40–4.50)

## 2018-11-15 LAB — VITAMIN B12: Vitamin B-12: 601 pg/mL (ref 200–1100)

## 2018-11-15 NOTE — Progress Notes (Signed)
Creatinine is elevated and GFR is low. Please advise patient to avoid NSAIDs at this time.  Vitamin B12 WNL. Vitamin D within desirable range. TSH WNL.  CK WNL. CBC WNL. UA normal.

## 2018-12-04 ENCOUNTER — Ambulatory Visit (HOSPITAL_COMMUNITY): Payer: Medicare Other | Attending: Cardiology

## 2018-12-04 ENCOUNTER — Other Ambulatory Visit: Payer: Self-pay

## 2018-12-04 DIAGNOSIS — I351 Nonrheumatic aortic (valve) insufficiency: Secondary | ICD-10-CM | POA: Diagnosis not present

## 2018-12-07 ENCOUNTER — Encounter: Payer: Self-pay | Admitting: Family Medicine

## 2018-12-07 ENCOUNTER — Ambulatory Visit (INDEPENDENT_AMBULATORY_CARE_PROVIDER_SITE_OTHER): Payer: Medicare Other | Admitting: Family Medicine

## 2018-12-07 VITALS — BP 140/64 | HR 60 | Temp 98.6°F | Ht 62.0 in | Wt 114.4 lb

## 2018-12-07 DIAGNOSIS — N183 Chronic kidney disease, stage 3 unspecified: Secondary | ICD-10-CM

## 2018-12-07 DIAGNOSIS — I10 Essential (primary) hypertension: Secondary | ICD-10-CM | POA: Diagnosis not present

## 2018-12-07 NOTE — Patient Instructions (Addendum)
blood pressure goal of <140/90 with CKD III. Continue current meds for now. She is going to check some home #s perhaps 4x a week and let me know through mychart and she also has a cardiology visit next week where we can get another data point.

## 2018-12-07 NOTE — Progress Notes (Signed)
Subjective:  Bailey [name] is a 85 y.o. year old very pleasant female patient who presents for/with See problem oriented charting ROS- some fatigue. Occasional mild shortness of breath- had recent echocardiogram which was stable. No edema- wears compression stockings. Occasional joint pain particularly in the cold.  Past Medical History-  Patient Active Problem List   Diagnosis Date Noted  . Sjogren's syndrome with keratoconjunctivitis sicca ([place]) 05/12/2017    Priority: High  . History of atrial fibrillation 09/05/2008    Priority: High  . CKD (chronic kidney disease), stage III ([place]) 12/08/2017    Priority: Medium  . Moderate aortic regurgitation 12/08/2017    Priority: Medium  . HTN (hypertension) 03/13/2015    Priority: Medium  . Raynauds syndrome 10/21/2014    Priority: Medium  . Primary osteoarthritis of both hands 08/21/2009    Priority: Medium  . PVC (premature ventricular contraction) 06/04/2009    Priority: Medium  . Mitral valve prolapse 12/25/2008    Priority: Medium  . Irritable bowel syndrome 09/04/2008    Priority: Medium  . Premature ventricular contractions     Priority: Low  . Hepatitis     Priority: Low  . History of skin cancer     Priority: Low  . Primary osteoarthritis of both feet 10/26/2016    Priority: Low  . Anemia 03/13/2015    Priority: Low  . 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 Edema 06/29/2011    Priority: Low  . Osteopenia 09/05/2008    Priority: Low  . GERD 09/04/2008    Priority: Low  . ALLERGY 09/04/2008    Priority: Low  . COLONIC POLYPS 09/04/2008    Priority: Low  . Allergy   . Balance disorder 10/26/2016    Medications- reviewed and updated Current Outpatient Medications  Medication Sig Dispense Refill  . aspirin 81 MG tablet Take 81 mg by mouth daily.    Marland Kitchen augmented betamethasone dipropionate (DIPROLENE-AF) 0.05 % ointment Apply topically as directed.  0  . carvedilol (COREG) 3.125 MG tablet Take 2 tablets  (6.25 mg total) in morning and 1 tablet in evening. 270 tablet 1  . Cholecalciferol (VITAMIN D) 400 UNITS capsule Take 2 capsules by mouth daily     . cycloSPORINE (RESTASIS) 0.05 % ophthalmic emulsion Place 1 drop into both eyes 2 (two) times daily.     . fish oil-omega-3 fatty acids 1000 MG capsule Take one capsule by mouth  daily    . losartan (COZAAR) 50 MG tablet Take 1 tablet (50 mg total) by mouth daily. 90 tablet 3  . PREMPRO 0.45-1.5 MG tablet TAKE 1 TABLET BY MOUTH EVERY DAY 28 tablet 11   No current facility-administered medications for this visit.     Objective: BP 140/64 (BP Location: Left Arm, Patient Position: Sitting, Cuff Size: Large)   Pulse 60   Temp 98.6 F (37 C) (Oral)   Ht 5\' 2"  (1.575 m)   Wt 114 lb 6.4 oz (51.9 kg)   LMP  (LMP Unknown)   SpO2 98%   BMI 20.92 kg/m  Gen: NAD, resting comfortably CV: RRR no murmurs rubs or gallops Lungs: CTAB no crackles, wheeze, rhonchi Ext: no edema Skin: warm, dry Neuro: speech normal, moves all extremities  Assessment/Plan:  Other notes: 1.podiatry visit tomorrow for her toe  2. IBS type symptoms have been better 3. Last full lipid panel was 2014 but we checked bad cholesterol last year and was normal-will not recheck at this time.  4. Some fatigue- had good bloodwork with rheum last month- she may have some caretaker burden as husband has been sicker  Hypertension S: controlled in the past on losartan 50 mg, Coreg 6.5 mg in the morning and 3.25 mg in the evening. No recent home blood pressure checks. Today blood pressure is slightly high and was in December as well. At last gyn visit in September it looked excellent thoguh BP Readings from Last 3 Encounters:  12/07/18 140/64  11/14/18 (!) 148/69  08/25/18 124/74  A/P: blood pressure goal of <140/90 with CKD III. Continue current meds for now. She is going to check some home #s and let me know through mychart and she also has a cardiology visit next week where we  can get another data point.    CKD III S: Patient with CKD stage III with filtration rate typically in the 50s or low 60s-she knows to avoid NSAIDs if possible but she gets relief with her back pain from this- used to take twice a day then down to once a day and at most recent visit was able to come off- she has stayed off.  She tries to use Tylenol but does not get as significant relief.  A/P: kidneys were stable on bmp last month with filtration rate at 53 and creatinine at 0.99  Future Appointments  Date Time Provider [place]  12/08/2018 10:00 AM Evelina Bucy, DPM [facility]  12/12/2018 10:00 AM Josue Hector, MD [facility]  05/17/2019  1:45 PM Bo Merino, MD [facility] None   Return in about 6 months (around 06/07/2019) for follow up- or sooner if needed.  Lab/Order associations: Essential hypertension  CKD (chronic kidney disease), stage III ([place])  Return precautions advised.  Garret Reddish, MD

## 2018-12-08 ENCOUNTER — Ambulatory Visit (INDEPENDENT_AMBULATORY_CARE_PROVIDER_SITE_OTHER): Payer: Medicare Other | Admitting: Podiatry

## 2018-12-08 VITALS — BP 150/74 | HR 65

## 2018-12-08 DIAGNOSIS — L600 Ingrowing nail: Secondary | ICD-10-CM | POA: Diagnosis not present

## 2018-12-08 DIAGNOSIS — B351 Tinea unguium: Secondary | ICD-10-CM | POA: Diagnosis not present

## 2018-12-08 DIAGNOSIS — M79676 Pain in unspecified toe(s): Secondary | ICD-10-CM | POA: Diagnosis not present

## 2018-12-08 DIAGNOSIS — M79609 Pain in unspecified limb: Principal | ICD-10-CM

## 2018-12-11 NOTE — Progress Notes (Signed)
Patient ID: Bailey [name], female   DOB: 1933/11/03, 85 y.o.   MRN: [id number]    85 y.o. history of PVC;s, HTN and moderate AR, Raynaud's and Sjorgrens Sees dermatology for chronic lichen sclerosis on right ankle   Last TTE done 12/04/18 reviewed EF 65-70% normal GLS -18.3 normal LV size Stable moderate AR   She brought her BP cuff in today and it is accurate compared to ours Compliant with meds Some fatigue   ROS: Denies fever, malais, weight loss, blurry vision, decreased visual acuity, cough, sputum, SOB, hemoptysis, pleuritic pain, palpitaitons, heartburn, abdominal pain, melena, lower extremity edema, claudication, or rash.  All other systems reviewed and negative  General: Vitals:   12/12/18 0931  BP: 118/60  Pulse: 73  SpO2: 99%   Affect appropriate Healthy:  appears stated age HEENT: normal Neck supple with no adenopathy JVP normal no bruits no thyromegaly Lungs clear with no wheezing and good diaphragmatic motion Heart:  S1/S2 SEM and AR  murmur, no rub, gallop or click PMI normal Abdomen: benighn, BS positve, no tenderness, no AAA no bruit.  No HSM or HJR Distal pulses intact with no bruits No edema Neuro non-focal Right ankle lichen sclerosis lesion  No muscular weakness      Current Outpatient Medications  Medication Sig Dispense Refill  . aspirin 81 MG tablet Take 81 mg by mouth daily.    Marland Kitchen augmented betamethasone dipropionate (DIPROLENE-AF) 0.05 % ointment Apply topically as directed.  0  . carvedilol (COREG) 3.125 MG tablet Take 2 tablets (6.25 mg total) in morning and 1 tablet in evening. 270 tablet 1  . Cholecalciferol (VITAMIN D) 400 UNITS capsule Take 2 capsules by mouth daily     . cycloSPORINE (RESTASIS) 0.05 % ophthalmic emulsion Place 1 drop into both eyes 2 (two) times daily.     . fish oil-omega-3 fatty acids 1000 MG capsule Take one capsule by mouth  daily    . losartan (COZAAR) 50 MG tablet Take 1 tablet (50 mg total) by mouth daily. 90 tablet  3  . PREMPRO 0.45-1.5 MG tablet TAKE 1 TABLET BY MOUTH EVERY DAY 28 tablet 11   No current facility-administered medications for this visit.     Allergies  Diamox [acetazolamide]; Epinephrine-lidocaine-na metabisulfite [lidocaine-epinephrine]; Erythromycin; Penicillins; Sulfonamide derivatives; and Tape  Electrocardiogram:  09/04/14  SR rate 70  PVC otherwise normal  10/01/15  SR rate 69  Normal ECG  05/06/16 SR rate 62 LAD otherwise normal  06/06/17  NSR rate 67 nonspecific inferior ST changes   Assessment and Plan Palpitations:  PAC;s /PVC;s  Stable on beta blocker  AR:   Moderate by echo 12/04/18  LV compensated on ARB  HTN:  ARB and beta blocker increased March 2019  Edema:  Venous wearing compression stockings improved f/u dermatology  Chest pain:  Improved normal myovue 04/05/16  observe  Anxiety:  Seems more easily stressed about things Encouraged her to as primary about SSRI Husbands health is biggest stress  Derm:  Consider 2nd opinion regarding non healing lichen lesion on right ankle   F/U  6 months   Jenkins Rouge

## 2018-12-11 NOTE — Progress Notes (Deleted)
Patient ID: Bailey [name], female   DOB: [birth year], 85 y.o.   MRN: [id number]     85 y.o. history of palpitations , PVCls, HTN, moderate AR, atypical chest pain, Raynaud's and Sjogren's  TTE done 12/06/17 EF 60-65% moderate AR estimated PA pressure 34 mmHg Labile BP with ARB and  Beta blocker increased March 2019. Atypical chest pain with normal myovue in May 2017  Home BP readings fine. Has had dermatology issues with ? Lichen sclerosis on ankle Seeing Dr Durward Fortes Some blistering and exudative drainage after Rx with cortisone and antibiotics  ***   ROS: Denies fever, malais, weight loss, blurry vision, decreased visual acuity, cough, sputum, SOB, hemoptysis, pleuritic pain, palpitaitons, heartburn, abdominal pain, melena, lower extremity edema, claudication, or rash.  All other systems reviewed and negative  General: There were no vitals filed for this visit. Affect appropriate Healthy:  appears stated age 85: normal Neck supple with no adenopathy JVP normal no bruits no thyromegaly Lungs clear with no wheezing and good diaphragmatic motion Heart:  S1/S2 SEM and AR  murmur, no rub, gallop or click PMI normal Abdomen: benighn, BS positve, no tenderness, no AAA no bruit.  No HSM or HJR Distal pulses intact with no bruits No edema Neuro non-focal Right ankle lichen sclerosis lesion  No muscular weakness      Current Outpatient Medications  Medication Sig Dispense Refill  . aspirin 81 MG tablet Take 81 mg by mouth daily.    Marland Kitchen augmented betamethasone dipropionate (DIPROLENE-AF) 0.05 % ointment Apply topically as directed.  0  . carvedilol (COREG) 3.125 MG tablet Take 2 tablets (6.25 mg total) in morning and 1 tablet in evening. 270 tablet 1  . Cholecalciferol (VITAMIN D) 400 UNITS capsule Take 2 capsules by mouth daily     . cycloSPORINE (RESTASIS) 0.05 % ophthalmic emulsion Place 1 drop into both eyes 2 (two) times daily.     . fish oil-omega-3 fatty acids 1000 MG capsule  Take one capsule by mouth  daily    . losartan (COZAAR) 50 MG tablet Take 1 tablet (50 mg total) by mouth daily. 90 tablet 3  . PREMPRO 0.45-1.5 MG tablet TAKE 1 TABLET BY MOUTH EVERY DAY 28 tablet 11   No current facility-administered medications for this visit.     Allergies  Diamox [acetazolamide]; Epinephrine-lidocaine-na metabisulfite [lidocaine-epinephrine]; Erythromycin; Penicillins; Sulfonamide derivatives; and Tape  Electrocardiogram:  09/04/14  SR rate 70  PVC otherwise normal  10/01/15  SR rate 69  Normal ECG  05/06/16 SR rate 62 LAD otherwise normal  06/06/17  NSR rate 67 nonspecific inferior ST changes   Assessment and Plan Palpitations:  PAC;s /PVC;s  Stable on beta blocker  AR:   Moderate by echo 12/06/17  LV compensated on ARB  F/u TTE ordered  HTN:  ARB and beta blocker increased March 2019  Edema:  Venous wearing compression stockings improved f/u dermatology  Chest pain:  Improved normal myovue 04/05/16  observe  Anxiety:  Seems more easily stressed about things Encouraged her to as primary about SSRI Husbands health is biggest stress  Derm:  Consider 2nd opinion regarding non healing lichen lesion on right ankle   F/U  6 months   Jenkins Rouge

## 2018-12-12 ENCOUNTER — Ambulatory Visit: Payer: Medicare Other | Admitting: Cardiovascular Disease

## 2018-12-12 ENCOUNTER — Ambulatory Visit (INDEPENDENT_AMBULATORY_CARE_PROVIDER_SITE_OTHER): Payer: Medicare Other | Admitting: Cardiovascular Disease

## 2018-12-12 ENCOUNTER — Encounter: Payer: Self-pay | Admitting: Cardiovascular Disease

## 2018-12-12 VITALS — BP 118/60 | HR 73 | Ht 62.0 in | Wt 114.0 lb

## 2018-12-12 DIAGNOSIS — R002 Palpitations: Secondary | ICD-10-CM

## 2018-12-12 DIAGNOSIS — I351 Nonrheumatic aortic (valve) insufficiency: Secondary | ICD-10-CM

## 2018-12-12 NOTE — Patient Instructions (Signed)
Medication Instructions:   If you need a refill on your cardiac medications before your next appointment, please call your pharmacy.   Lab work:  If you have labs (blood work) drawn today and your tests are completely normal, you will receive your results only by: . MyChart Message (if you have MyChart) OR . A paper copy in the mail If you have any lab test that is abnormal or we need to change your treatment, we will call you to review the results.  Testing/Procedures: Your physician has requested that you have an echocardiogram in one year. Echocardiography is a painless test that uses sound waves to create images of your heart. It provides your doctor with information about the size and shape of your heart and how well your heart's chambers and valves are working. This procedure takes approximately one hour. There are no restrictions for this procedure.  Follow-Up: At [facility], you and your health needs are our priority.  As part of our continuing mission to provide you with exceptional heart care, we have created designated Provider Care Teams.  These Care Teams include your primary Cardiologist (physician) and Advanced Practice Providers (APPs -  Physician Assistants and Nurse Practitioners) who all work together to provide you with the care you need, when you need it. You will need a follow up appointment in 1 years.  Please call our office 2 months in advance to schedule this appointment.  You may see Peter Nishan, MD or one of the following Advanced Practice Providers on your designated Care Team:   Lori Gerhardt, NP Laura Ingold, NP . Jill McDaniel, NP   

## 2018-12-17 NOTE — Progress Notes (Signed)
Subjective:  Patient ID: Bailey [name], female    DOB: [birth year],  MRN: [id number]  Chief Complaint  Patient presents with  . Nail Problem    Right 1st toe pain, particularly lateral nail, no known injury. Pt denies fever/vomiting/nausea/chills.    85 y.o. female presents with the above complaint.  Reports painfully elongated nails to both feet.  Review of Systems: Negative except as noted in the HPI. Denies N/V/F/Ch.  Past Medical History:  Diagnosis Date  . Allergy, unspecified not elsewhere classified   . Anemia, unspecified   . Benign neoplasm of colon   . Cancer [facility])    basal/sqamous/transitional melanoma  . CKD (chronic kidney disease), stage III ([place]) 12/08/2017  . COLONIC POLYPS 09/04/2008   Was told by Gi does not need anymore.    . Diverticulosis of colon (without mention of hemorrhage)   . Dry eye syndrome   . Dysrhythmia    PVCs  . Esophageal reflux   . Hepatitis 1950s   infectious from food  . History of atrial fibrillation   . Hypertension   . Irritable bowel syndrome   . Mitral valve disorders(424.0)   . Moderate aortic regurgitation 12/08/2017  . Osteopenia 06/2018   T score -1.8 FRAX 10% / 2.5%  . Palpitations   . Premature ventricular contractions   . Rheumatoid arthritis(714.0)   . Sicca syndrome ([place])   . Ulcer 1992  . Vaginal delivery [place]    Current Outpatient Medications:  .  aspirin 81 MG tablet, Take 81 mg by mouth daily., Disp: , Rfl:  .  augmented betamethasone dipropionate (DIPROLENE-AF) 0.05 % ointment, Apply topically as directed., Disp: , Rfl: 0 .  carvedilol (COREG) 3.125 MG tablet, Take 2 tablets (6.25 mg total) in morning and 1 tablet in evening., Disp: 270 tablet, Rfl: 1 .  Cholecalciferol (VITAMIN D) 400 UNITS capsule, Take 2 capsules by mouth daily , Disp: , Rfl:  .  cycloSPORINE (RESTASIS) 0.05 % ophthalmic emulsion, Place 1 drop into both eyes 2 (two) times daily. , Disp: , Rfl:  .  fish oil-omega-3 fatty acids  1000 MG capsule, Take one capsule by mouth  daily, Disp: , Rfl:  .  losartan (COZAAR) 50 MG tablet, Take 1 tablet (50 mg total) by mouth daily., Disp: 90 tablet, Rfl: 3 .  PREMPRO 0.45-1.5 MG tablet, TAKE 1 TABLET BY MOUTH EVERY DAY, Disp: 28 tablet, Rfl: 11  Social History   Tobacco Use  Smoking Status Former Smoker  . Packs/day: 1.00  . Years: 9.00  . Pack years: 9.00  . Types: Cigarettes  . Last attempt to quit: 11/30/1963  . Years since quitting: 55.0  Smokeless Tobacco Never Used    Allergies  Allergen Reactions  . Diamox [Acetazolamide] Nausea Only  . Epinephrine-Lidocaine-Na Metabisulfite [Lidocaine-Epinephrine] Other (See Comments)    Atrial fibrillation  . Erythromycin Nausea Only  . Penicillins Other (See Comments)    Unsure of reaction  . Sulfonamide Derivatives Nausea Only and Other (See Comments)    Causes fever, kidney problems  . Tape Rash    Sensitive to skin   Objective:   Vitals:   12/08/18 1033  BP: (!) 150/74  Pulse: 65   There is no height or weight on file to calculate BMI. Constitutional Well developed. Well nourished.  Vascular Dorsalis pedis pulses palpable bilaterally. Posterior tibial pulses palpable bilaterally. Capillary refill normal to all digits.  No cyanosis or clubbing noted. Pedal hair growth normal.  Neurologic Normal  speech. Oriented to person, place, and time. Epicritic sensation to light touch grossly present bilaterally.  Dermatologic Nails elongated dystrophic pain to palpation No open wounds. No skin lesions.  Orthopedic: Normal joint ROM without pain or crepitus bilaterally. No visible deformities. No bony tenderness.   Radiographs: None Assessment:   1. Pain due to onychomycosis of nail   2. Ingrown nail    Plan:  Patient was evaluated and treated and all questions answered.  Onychomycosis with pain -Nails palliatively debridement as below. Debrided in slant back fashion to remove all ingrown nails -Educated  on self-care  Procedure: Nail Debridement Rationale: Pain Type of Debridement: manual, sharp debridement. Instrumentation: Nail nipper, rotary burr. Number of Nails: 10    No follow-ups on file.

## 2019-01-21 ENCOUNTER — Other Ambulatory Visit: Payer: Self-pay | Admitting: Cardiovascular Disease

## 2019-02-06 ENCOUNTER — Telehealth: Payer: Self-pay | Admitting: Rheumatology

## 2019-02-06 NOTE — Telephone Encounter (Signed)
Patient called stating she received a bill from [place] for $362.00.  Patient states she called both [facility] and Medicare and found out the bill was for the Vitamin D and Vitamin B12 tests that were included with her bloodwork on 11/14/18.  Patient states Medicare denied the charges stating they were "unnecessary blood tests."  Patient states Medicare told her to contact the office and have them submit explanation why medically necessary and send to Medicare.  Please advise.

## 2019-02-08 NOTE — Telephone Encounter (Signed)
Patient states she got a bill from [place]. Patient states the bill was $362. Patient states she has contacted [facility]. Patient advised that we have sent the diagnosis codes that we have and can associate with those test. Patient verbalized understanding.

## 2019-05-03 NOTE — Progress Notes (Signed)
Office Visit Note  Patient: Bailey [name]             Date of Birth: [birth year]           MRN: [id number]             PCP: Marin Olp, MD Referring: Marin Olp, MD Visit Date: 05/17/2019 Occupation: @GUAROCC @  Subjective:  Pain in both elbows   History of Present Illness: Bailey [name] is a 85 y.o. female with history of Sjogren's and osteoarthritis.  She states she continues to have sicca symptoms for which she has been using over-the-counter products.  Recently she was having some lower back pain for which she has been doing some back exercises and is improving.  She continues to have some stiffness in her hands and feet due to underlying osteoarthritis.  She denies any joint swelling.  She states she is been under a lot of stress and not been able to exercise as her husband's health is not good.  Activities of Daily Living:  Patient reports morning stiffness for 10-15 minutes.   Patient Denies nocturnal pain.  Difficulty dressing/grooming: Denies Difficulty climbing stairs: Denies Difficulty getting out of chair: Denies Difficulty using hands for taps, buttons, cutlery, and/or writing: Denies  Review of Systems  Constitutional: Positive for fatigue.  HENT: Positive for mouth dryness. Negative for mouth sores.   Eyes: Positive for dryness. Negative for itching.  Respiratory: Negative for shortness of breath and wheezing.   Cardiovascular: Negative for chest pain and swelling in legs/feet.  Gastrointestinal: Negative for constipation and diarrhea.  Endocrine: Negative for increased urination.  Genitourinary: Negative for painful urination and pelvic pain.  Musculoskeletal: Positive for arthralgias, joint pain and morning stiffness. Negative for joint swelling.  Skin: Positive for rash.  Allergic/Immunologic: Negative for susceptible to infections.  Neurological: Positive for dizziness. Negative for light-headedness, headaches, memory loss and weakness.   Hematological: Negative for swollen glands.  Psychiatric/Behavioral: Negative for confusion. The patient is not nervous/anxious.     PMFS History:  Patient Active Problem List   Diagnosis Date Noted  . Allergy   . CKD (chronic kidney disease), stage III ([place]) 12/08/2017  . Moderate aortic regurgitation 12/08/2017  . Premature ventricular contractions   . Hepatitis   . History of skin cancer   . Sjogren's syndrome with keratoconjunctivitis sicca ([place]) 05/12/2017  . Balance disorder 10/26/2016  . Primary osteoarthritis of both feet 10/26/2016  . HTN (hypertension) 03/13/2015  . Anemia 03/13/2015  . Raynauds syndrome 10/21/2014  . Recurrent cold sores 10/21/2014  . Edema 06/29/2011  . Primary osteoarthritis of both hands 08/21/2009  . PVC (premature ventricular contraction) 06/04/2009  . Mitral valve prolapse 12/25/2008  . History of atrial fibrillation 09/05/2008  . Osteopenia 09/05/2008  . GERD 09/04/2008  . Irritable bowel syndrome 09/04/2008  . ALLERGY 09/04/2008  . COLONIC POLYPS 09/04/2008    Past Medical History:  Diagnosis Date  . Allergy, unspecified not elsewhere classified   . Anemia, unspecified   . Benign neoplasm of colon   . Cancer [facility])    basal/sqamous/transitional melanoma  . CKD (chronic kidney disease), stage III ([place]) 12/08/2017  . COLONIC POLYPS 09/04/2008   Was told by Gi does not need anymore.    . Diverticulosis of colon (without mention of hemorrhage)   . Dry eye syndrome   . Dysrhythmia    PVCs  . Esophageal reflux   . Hepatitis 1950s   infectious from food  . History of  atrial fibrillation   . Hypertension   . Irritable bowel syndrome   . Mitral valve disorders(424.0)   . Moderate aortic regurgitation 12/08/2017  . Osteopenia 06/2018   T score -1.8 FRAX 10% / 2.5%  . Palpitations   . Premature ventricular contractions   . Rheumatoid arthritis(714.0)   . Sicca syndrome ([place])   . Ulcer 1992  . Vaginal delivery 1959, 1960, 1962, 1964     Family History  Problem Relation Age of Onset  . Stroke Mother   . Hypertension Mother   . Ulcerative colitis Father   . Heart attack Father   . Breast cancer Maternal Grandmother        Age 78's  . Uterine cancer Maternal Grandmother   . Hypertension Maternal Grandfather   . Colon cancer Paternal Grandmother    Past Surgical History:  Procedure Laterality Date  . APPENDECTOMY    . CATARACT EXTRACTION    . CHOLECYSTECTOMY    . colon tumor  1969   Benign rectal tumor excised  . DILATATION & CURETTAGE/HYSTEROSCOPY WITH MYOSURE N/A 01/21/2015   Procedure: DILATATION & CURETTAGE/HYSTEROSCOPY WITH MYOSURE;  Surgeon: [name], MD;  Location: [facility];  Service: Gynecology;  Laterality: N/A;  . HIATAL HERNIA REPAIR    . REFRACTIVE SURGERY    . ROTATOR CUFF REPAIR  2009   right by Dr. French Ana  . squamous cell removed  2013   Dr. Sherrye Payor --removed from her right cheek  . TUBAL LIGATION     Social History   Social History Narrative   Married 1958. 4 children. 10 grandkids (1 trying to go to medical school). No greatgrandkids.       Retired from nursing (medical and surgical, most recently in cardiology)   Degree in history and english after finished nursing- 2 mo before 72th birthday      Hobbies: education, travel, reading, piano    Immunization History  Administered Date(s) Administered  . H1N1 11/26/2008  . Influenza Split 08/30/2011, 10/31/2012  . Influenza Whole 09/05/2008, 08/27/2009, 08/20/2010  . Influenza, High Dose Seasonal PF 08/31/2016, 09/05/2017, 08/30/2018  . Influenza,inj,Quad PF,6+ Mos 09/13/2013, 10/04/2014, 10/01/2015  . Pneumococcal Conjugate-13 10/21/2014  . Pneumococcal Polysaccharide-23 10/11/2015  . Td 02/17/2010  . Zoster 11/29/2008     Objective: Vital Signs: BP (!) 141/71 (BP Location: Left Arm, Patient Position: Sitting, Cuff Size: Normal)   Pulse 70   Resp 13   Ht 5\' 2"  (1.575 m)   Wt 115 lb 12.8 oz (52.5 kg)   LMP  (LMP Unknown)    BMI 21.18 kg/m    Physical Exam Vitals signs and nursing note reviewed.  Constitutional:      Appearance: She is well-developed.  HENT:     Head: Normocephalic and atraumatic.  Eyes:     Conjunctiva/sclera: Conjunctivae normal.  Neck:     Musculoskeletal: Normal range of motion.  Cardiovascular:     Rate and Rhythm: Normal rate and regular rhythm.     Heart sounds: Normal heart sounds.  Pulmonary:     Effort: Pulmonary effort is normal.     Breath sounds: Normal breath sounds.  Abdominal:     General: Bowel sounds are normal.     Palpations: Abdomen is soft.  Lymphadenopathy:     Cervical: No cervical adenopathy.  Skin:    General: Skin is warm and dry.     Capillary Refill: Capillary refill takes less than 2 seconds.  Neurological:     Mental Status: She  is alert and oriented to person, place, and time.  Psychiatric:        Behavior: Behavior normal.      Musculoskeletal Exam: Good range of motion of cervical thoracic and lumbar spine.  Shoulder joints elbow joints wrist joints with good range of motion.  She has DIP and PIP thickening in her hands and feet consistent with osteoarthritis.  No synovitis was noted.  She had tenderness over bilateral lateral epicondyle consistent with lateral epicondylitis.  CDAI Exam: CDAI Score: - Patient Global: -; Provider Global: - Swollen: -; Tender: - Joint Exam   No joint exam has been documented for this visit   There is currently no information documented on the homunculus. Go to the Rheumatology activity and complete the homunculus joint exam.  Investigation: No additional findings.  Imaging: No results found.  Recent Labs: Lab Results  Component Value Date   WBC 10.3 11/14/2018   HGB 12.0 11/14/2018   PLT 361 11/14/2018   NA 140 11/14/2018   K 3.7 11/14/2018   CL 102 11/14/2018   CO2 31 11/14/2018   GLUCOSE 82 11/14/2018   BUN 17 11/14/2018   CREATININE 0.99 (H) 11/14/2018   BILITOT 0.3 11/14/2018   ALKPHOS  51 01/28/2018   AST 14 11/14/2018   ALT 11 11/14/2018   PROT 7.2 11/14/2018   ALBUMIN 3.8 01/28/2018   CALCIUM 9.9 11/14/2018   GFRAA 61 11/14/2018    Speciality Comments: No specialty comments available.  Procedures:  No procedures performed Allergies: Diamox [acetazolamide], Epinephrine-lidocaine-na metabisulfite [lidocaine-epinephrine], Erythromycin, Penicillins, Sulfonamide derivatives, and Tape   Assessment / Plan:     Visit Diagnoses: Sjogren's syndrome with keratoconjunctivitis sicca ([place]) - RF,-SPEP 11/17 -patient continues to have some sicca symptoms.  Over-the-counter products were reviewed.    Raynaud's disease without gangrene - Plan: Currently not active.  Chronic midline low back pain without sciatica - Plan: She has been doing some exercises at home.  Some of the exercises were demonstrated in the office.  Lateral epicondylitis, left elbow - Plan: She has bilateral lateral epicondylitis.  She states she has been weaving recently which could be causing the problem.  I have discussed some elbow exercises.  Use of over-the-counter Voltaren gel was discussed.  Side effects were reviewed.  Primary osteoarthritis of both hands - Plan: Joint protection muscle strengthening was discussed.  Primary osteoarthritis of both feet - Plan: Proper fitting shoes were discussed.  Osteopenia of multiple sites - DEXA 07/26/18 L femoral neck T-score -1.8 - Plan: Use of calcium vitamin D and resistive exercises were discussed.  Other fatigue - Plan: Related to fibromyalgia.  Other medical problems are listed as follows:  History of IBS -  History of anemia   History of atrial fibrillation  History of hypertension - - Plan: Blood pressure is elevated.  Have advised her to monitor her blood pressure closely.  Balance disorder - Plan: Exercises were demonstrated and discussed.  History of gastroesophageal reflux (GERD)  Orders: No orders of the defined types were placed in this  encounter.  No orders of the defined types were placed in this encounter.   Face-to-face time spent with patient was 30 minutes. Greater than 50% of time was spent in counseling and coordination of care.  Follow-Up Instructions: Return in about 6 months (around 11/16/2019) for Osteoarthritis, sicca.   Bo Merino, MD  Note - This record has been created using Editor, commissioning.  Chart creation errors have been sought, but may not always  have  been located. Such creation errors do not reflect on  the standard of medical care.

## 2019-05-04 ENCOUNTER — Other Ambulatory Visit: Payer: Self-pay | Admitting: Gynecology

## 2019-05-16 DIAGNOSIS — Z961 Presence of intraocular lens: Secondary | ICD-10-CM | POA: Diagnosis not present

## 2019-05-16 DIAGNOSIS — H04123 Dry eye syndrome of bilateral lacrimal glands: Secondary | ICD-10-CM | POA: Diagnosis not present

## 2019-05-17 ENCOUNTER — Other Ambulatory Visit: Payer: Self-pay

## 2019-05-17 ENCOUNTER — Ambulatory Visit (INDEPENDENT_AMBULATORY_CARE_PROVIDER_SITE_OTHER): Payer: Medicare Other | Admitting: Rheumatology

## 2019-05-17 ENCOUNTER — Encounter: Payer: Self-pay | Admitting: Rheumatology

## 2019-05-17 VITALS — BP 141/71 | HR 70 | Resp 13 | Ht 62.0 in | Wt 115.8 lb

## 2019-05-17 DIAGNOSIS — M19041 Primary osteoarthritis, right hand: Secondary | ICD-10-CM | POA: Diagnosis not present

## 2019-05-17 DIAGNOSIS — M7712 Lateral epicondylitis, left elbow: Secondary | ICD-10-CM

## 2019-05-17 DIAGNOSIS — I7300 Raynaud's syndrome without gangrene: Secondary | ICD-10-CM | POA: Diagnosis not present

## 2019-05-17 DIAGNOSIS — R2689 Other abnormalities of gait and mobility: Secondary | ICD-10-CM

## 2019-05-17 DIAGNOSIS — Z8719 Personal history of other diseases of the digestive system: Secondary | ICD-10-CM

## 2019-05-17 DIAGNOSIS — M19042 Primary osteoarthritis, left hand: Secondary | ICD-10-CM

## 2019-05-17 DIAGNOSIS — M8589 Other specified disorders of bone density and structure, multiple sites: Secondary | ICD-10-CM

## 2019-05-17 DIAGNOSIS — R5383 Other fatigue: Secondary | ICD-10-CM | POA: Diagnosis not present

## 2019-05-17 DIAGNOSIS — M3501 Sicca syndrome with keratoconjunctivitis: Secondary | ICD-10-CM | POA: Diagnosis not present

## 2019-05-17 DIAGNOSIS — Z8679 Personal history of other diseases of the circulatory system: Secondary | ICD-10-CM | POA: Diagnosis not present

## 2019-05-17 DIAGNOSIS — M19072 Primary osteoarthritis, left ankle and foot: Secondary | ICD-10-CM

## 2019-05-17 DIAGNOSIS — M545 Low back pain, unspecified: Secondary | ICD-10-CM

## 2019-05-17 DIAGNOSIS — Z862 Personal history of diseases of the blood and blood-forming organs and certain disorders involving the immune mechanism: Secondary | ICD-10-CM

## 2019-05-17 DIAGNOSIS — G8929 Other chronic pain: Secondary | ICD-10-CM

## 2019-05-17 DIAGNOSIS — M19071 Primary osteoarthritis, right ankle and foot: Secondary | ICD-10-CM | POA: Diagnosis not present

## 2019-06-12 ENCOUNTER — Ambulatory Visit: Payer: Medicare Other | Admitting: Family Medicine

## 2019-06-19 ENCOUNTER — Ambulatory Visit: Payer: Medicare Other | Admitting: Family Medicine

## 2019-06-22 ENCOUNTER — Encounter: Payer: Self-pay | Admitting: Family Medicine

## 2019-06-22 ENCOUNTER — Ambulatory Visit (INDEPENDENT_AMBULATORY_CARE_PROVIDER_SITE_OTHER): Payer: Medicare Other | Admitting: Family Medicine

## 2019-06-22 VITALS — BP 134/69 | HR 71 | Temp 98.1°F | Ht 62.0 in | Wt 113.4 lb

## 2019-06-22 DIAGNOSIS — I10 Essential (primary) hypertension: Secondary | ICD-10-CM

## 2019-06-22 DIAGNOSIS — N183 Chronic kidney disease, stage 3 unspecified: Secondary | ICD-10-CM

## 2019-06-22 DIAGNOSIS — I480 Paroxysmal atrial fibrillation: Secondary | ICD-10-CM | POA: Diagnosis not present

## 2019-06-22 NOTE — Patient Instructions (Addendum)
No changes today  Lets have you monitor blood pressure at home once a day and send me results after about 14 days on mychart- hoping #s are better at home but if not we may adjust your medicine such as increasing losartan to 100mg . Really want <140/90 regularly

## 2019-06-22 NOTE — Progress Notes (Addendum)
Phone [phone]   Subjective:  Bailey [name] is a 85 y.o. year old very pleasant female patient who presents for/with See problem oriented charting Chief Complaint  Patient presents with  . Hypertension   ROS- some fatigue . No chest pain or shortness of breath. No headache or blurry vision. Low appetite  Past Medical History-  Patient Active Problem List   Diagnosis Date Noted  . Sjogren's syndrome with keratoconjunctivitis sicca ([place]) 05/12/2017    Priority: High  . History of atrial fibrillation 09/05/2008    Priority: High  . CKD (chronic kidney disease), stage III ([place]) 12/08/2017    Priority: Medium  . Moderate aortic regurgitation 12/08/2017    Priority: Medium  . HTN (hypertension) 03/13/2015    Priority: Medium  . Raynauds syndrome 10/21/2014    Priority: Medium  . Primary osteoarthritis of both hands 08/21/2009    Priority: Medium  . PVC (premature ventricular contraction) 06/04/2009    Priority: Medium  . Mitral valve prolapse 12/25/2008    Priority: Medium  . Irritable bowel syndrome 09/04/2008    Priority: Medium  . Premature ventricular contractions     Priority: Low  . Hepatitis     Priority: Low  . History of skin cancer     Priority: Low  . Primary osteoarthritis of both feet 10/26/2016    Priority: Low  . Anemia 03/13/2015    Priority: Low  . 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 Edema 06/29/2011    Priority: Low  . Osteopenia 09/05/2008    Priority: Low  . GERD 09/04/2008    Priority: Low  . ALLERGY 09/04/2008    Priority: Low  . COLONIC POLYPS 09/04/2008    Priority: Low  . Paroxysmal atrial fibrillation ([place]) 06/23/2019  . Allergy   . Balance disorder 10/26/2016    Medications- reviewed and updated Current Outpatient Medications  Medication Sig Dispense Refill  . aspirin 81 MG tablet Take 81 mg by mouth daily.    . carvedilol (COREG) 3.125 MG tablet Take 2 tablets (6.25 mg total) in morning and 1 tablet in evening.  270 tablet 1  . Cholecalciferol (VITAMIN D) 400 UNITS capsule Take 2 capsules by mouth daily     . cycloSPORINE (RESTASIS) 0.05 % ophthalmic emulsion Place 1 drop into both eyes 2 (two) times daily.     . fish oil-omega-3 fatty acids 1000 MG capsule Take one capsule by mouth  daily    . losartan (COZAAR) 50 MG tablet TAKE 1 TABLET BY MOUTH EVERY DAY 90 tablet 3  . PREMPRO 0.45-1.5 MG tablet TAKE 1 TABLET BY MOUTH EVERY DAY 84 tablet 0   No current facility-administered medications for this visit.      Objective:  BP (!) 150/70   Pulse 71   Temp 98.1 F (36.7 C) (Oral)   Ht 5\' 2"  (1.575 m)   Wt 113 lb 6.4 oz (51.4 kg)   LMP  (LMP Unknown)   SpO2 98%   BMI 20.74 kg/m  Gen: NAD, resting comfortably CV: RRR no  rubs or gallops.  History of aortic regurg murmur Lungs: CTAB no crackles, wheeze, rhonchi Abdomen: soft/nontender/nondistended Ext: trace edema under compression stockings Skin: warm, dry    Assessment and Plan   Other notes: 1.still following closely with Dr. Estanislado Pandy- feels like sjogren's has been better 2. IBS stable- doesn't seem to change much with what she eats or doesn't eat.   3. A lot of stress with caring  for husband. Very busy week this week and he had fall earlier today- walker just in AM but needs to use more likely.   #hypertension/A Fib S: Blood pressure controlled typically on Carvedilol 3.125 2 tablets in the morning and 1 tablet in the evening and Losartan 50 mg daily.   She currently appears to be in sinus rhythm-no anticoagulation iunless she goes back into a fib per cardiology .  Carvedilol would offer rate control if she goes back into A. fib as well  Came back last Saturday from beach- admits to saltier foods lately.  Admits to a lot of stress in caring for her husband as well BP Readings from Last 3 Encounters:  06/22/19 (!) 150/70  05/17/19 (!) 141/71  12/12/18 118/60  A/P: Blood pressure with poor control today- she is going to do some home  monitoring and we will get a 10-14 day average in about 2 weeks.  - try to watch salt intake at home.   (Addendum on 07/09/2019- 134/69 is average over about 2 weeks)   Paroxysmal atrial fibrillation-appears to remain in sinus rhythm-continue current medications with aspirin only for anticoagulation  # CKD III S:GFR has been stable in 38s. Knows to avoid nsaids  A/P: Stable on labs today  Recommended follow up: 6 months-may delay depending on severity of cold/flu/COVID season Future Appointments  Date Time Provider [place]  08/27/2019  2:00 PM Fontaine, Belinda Block, MD [facility]  11/13/2019  1:45 PM Bo Merino, MD [facility] None  12/25/2019  2:40 PM Yong Channel Brayton Mars, MD [facility]    Lab/Order associations:   ICD-10-CM   1. CKD (chronic kidney disease), stage III (HCC)  R48.5 Basic metabolic panel    Basic metabolic panel    CANCELED: Basic metabolic panel  2. Paroxysmal atrial fibrillation (HCC) Chronic I48.0   3. Essential hypertension  I10    Return precautions advised.  Garret Reddish, MD

## 2019-06-23 DIAGNOSIS — I480 Paroxysmal atrial fibrillation: Secondary | ICD-10-CM | POA: Insufficient documentation

## 2019-06-23 LAB — BASIC METABOLIC PANEL
BUN/Creatinine Ratio: 22 (calc) (ref 6–22)
BUN: 20 mg/dL (ref 7–25)
CO2: 30 mmol/L (ref 20–32)
Calcium: 9.6 mg/dL (ref 8.6–10.4)
Chloride: 103 mmol/L (ref 98–110)
Creat: 0.93 mg/dL — ABNORMAL HIGH (ref 0.60–0.88)
Glucose, Bld: 119 mg/dL — ABNORMAL HIGH (ref 65–99)
Potassium: 3.9 mmol/L (ref 3.5–5.3)
Sodium: 140 mmol/L (ref 135–146)

## 2019-07-07 ENCOUNTER — Encounter: Payer: Self-pay | Admitting: Family Medicine

## 2019-07-15 ENCOUNTER — Other Ambulatory Visit: Payer: Self-pay | Admitting: Cardiovascular Disease

## 2019-07-24 ENCOUNTER — Other Ambulatory Visit: Payer: Self-pay | Admitting: Gynecology

## 2019-07-30 ENCOUNTER — Encounter: Payer: Self-pay | Admitting: Gynecology

## 2019-07-30 DIAGNOSIS — Z803 Family history of malignant neoplasm of breast: Secondary | ICD-10-CM | POA: Diagnosis not present

## 2019-07-30 DIAGNOSIS — Z1231 Encounter for screening mammogram for malignant neoplasm of breast: Secondary | ICD-10-CM | POA: Diagnosis not present

## 2019-08-21 ENCOUNTER — Ambulatory Visit: Payer: Medicare Other

## 2019-08-21 ENCOUNTER — Ambulatory Visit (INDEPENDENT_AMBULATORY_CARE_PROVIDER_SITE_OTHER): Payer: Medicare Other

## 2019-08-21 ENCOUNTER — Encounter: Payer: Self-pay | Admitting: Family Medicine

## 2019-08-21 ENCOUNTER — Other Ambulatory Visit: Payer: Self-pay

## 2019-08-21 DIAGNOSIS — Z23 Encounter for immunization: Secondary | ICD-10-CM

## 2019-08-22 ENCOUNTER — Ambulatory Visit: Payer: Medicare Other

## 2019-08-24 ENCOUNTER — Other Ambulatory Visit: Payer: Self-pay

## 2019-08-27 ENCOUNTER — Encounter: Payer: Self-pay | Admitting: Gynecology

## 2019-08-27 ENCOUNTER — Other Ambulatory Visit: Payer: Self-pay

## 2019-08-27 ENCOUNTER — Ambulatory Visit (INDEPENDENT_AMBULATORY_CARE_PROVIDER_SITE_OTHER): Payer: Medicare Other | Admitting: Gynecology

## 2019-08-27 VITALS — BP 122/78 | Ht 62.0 in | Wt 114.0 lb

## 2019-08-27 DIAGNOSIS — N952 Postmenopausal atrophic vaginitis: Secondary | ICD-10-CM | POA: Diagnosis not present

## 2019-08-27 DIAGNOSIS — M8580 Other specified disorders of bone density and structure, unspecified site: Secondary | ICD-10-CM

## 2019-08-27 DIAGNOSIS — Z01419 Encounter for gynecological examination (general) (routine) without abnormal findings: Secondary | ICD-10-CM | POA: Diagnosis not present

## 2019-08-27 DIAGNOSIS — Z79890 Hormone replacement therapy (postmenopausal): Secondary | ICD-10-CM | POA: Diagnosis not present

## 2019-08-27 NOTE — Progress Notes (Signed)
    OTTILIE HUTNIK 1933-03-27 [id number]        85 y.o.  [id number] for breast and pelvic exam.  Without gynecologic complaints  Past medical history,surgical history, problem list, medications, allergies, family history and social history were all reviewed and documented as reviewed in the EPIC chart.  ROS:  Performed with pertinent positives and negatives included in the history, assessment and plan.   Additional significant findings : None   Exam: [name] assistant Vitals:   08/27/19 1358  BP: 122/78  Weight: 114 lb (51.7 kg)  Height: 5\' 2"  ([id number] m)   Body mass index is 20.85 kg/m.  General appearance:  Normal affect, orientation and appearance. Skin: Grossly normal HEENT: Without gross lesions.  No cervical or supraclavicular adenopathy. Thyroid normal.  Lungs:  Clear without wheezing, rales or rhonchi Cardiac: RR, without RMG Abdominal:  Soft, nontender, without masses, guarding, rebound, organomegaly or hernia Breasts:  Examined lying and sitting without masses, retractions, discharge or axillary adenopathy. Pelvic:  Ext, BUS, Vagina: With atrophic changes  Cervix: With atrophic changes  Uterus: Anteverted, normal size, shape and contour, midline and mobile nontender   Adnexa: Without masses or tenderness    Anus and perineum: Normal   Rectovaginal: Normal sphincter tone without palpated masses or tenderness.    Assessment/Plan:  85 y.o. [id number] female for breast and pelvic exam  1. HRT.  Continues on Prempro 0.45/1.5.  Would like to continue.  We discussed the risks multiple times to include stroke heart attack DVT in the breast cancer issue.  At this point the patient wants to continue understanding and accepting the risks.  No bleeding.  Refill x1 year provided.   2. Osteopenia.  DEXA 2019 T score -1.8 stable from prior DEXA.  Plan follow-up DEXA next year at 2-year interval. 3. Mammography last week.  Breast exam normal today. 4. Colonoscopy 2013.  Repeat at their  recommended interval. 5. Pap smear 2012.  No Pap smear done today.  No history of significant abnormal Pap smears.  We both agree to stop screening per current screening guidelines. 6. Health maintenance.  No routine lab work done as patient does this elsewhere.  Follow-up 1 year, sooner as needed.   [name] MD, 2:24 PM 08/27/2019

## 2019-08-27 NOTE — Patient Instructions (Signed)
Follow-up in 1 year for annual exam 

## 2019-08-29 ENCOUNTER — Telehealth: Payer: Self-pay | Admitting: Family Medicine

## 2019-08-29 NOTE — Telephone Encounter (Signed)
I called the patient to schedule AWV with Courtney, but there was no answer and no option to leave a message. VDM (Dee-Dee) °

## 2019-09-03 ENCOUNTER — Telehealth: Payer: Self-pay | Admitting: *Deleted

## 2019-09-03 MED ORDER — PREMPRO 0.45-1.5 MG PO TABS: 1.0000 | ORAL_TABLET | Freq: Every day | ORAL | 3 refills | Status: DC

## 2019-09-03 NOTE — Telephone Encounter (Signed)
Patient called stating Prempro 0.45-1.5 mg tablet was never sent to pharmacy from [facility] on 08/27/19. Rx sent,. Patient husband informed.

## 2019-09-06 ENCOUNTER — Encounter: Payer: Self-pay | Admitting: Gynecology

## 2019-09-14 ENCOUNTER — Other Ambulatory Visit: Payer: Self-pay

## 2019-09-14 ENCOUNTER — Ambulatory Visit (INDEPENDENT_AMBULATORY_CARE_PROVIDER_SITE_OTHER): Payer: Medicare Other

## 2019-09-14 VITALS — BP 128/62 | Temp 97.7°F | Ht 62.0 in | Wt 113.6 lb

## 2019-09-14 DIAGNOSIS — Z0000 Encounter for general adult medical examination without abnormal findings: Secondary | ICD-10-CM | POA: Diagnosis not present

## 2019-09-14 NOTE — Patient Instructions (Signed)
Bailey [name] , Thank you for taking time to come for your Medicare Wellness Visit. I appreciate your ongoing commitment to your health goals. Please review the following plan we discussed and let me know if I can assist you in the future.   Screening recommendations/referrals: Colorectal Screening: completed 07/14/12; no longer indicated Mammogram: up to date; last 07/30/19 Bone Density: up to date; last 07/26/18  Vision and Dental Exams: Recommended annual ophthalmology exams for early detection of glaucoma and other disorders of the eye Recommended annual dental exams for proper oral hygiene  Vaccinations: Influenza vaccine: completed 08/21/19 Pneumococcal vaccine: up to date; last 10/11/15 Tdap vaccine: up to date; last 02/17/10 Shingles vaccine: Please call your insurance company to determine your out of pocket expense for the Shingrix vaccine. You may receive this vaccine at your local pharmacy.  Advanced directives:Please bring a copy of your POA (Power of Attorney) and/or Living Will to your next appointment.  Goals: Recommend to drink at least 6-8 8oz glasses of water per day.  Next appointment: Please schedule your Annual Wellness Visit with your Nurse Health Advisor in one year.  Preventive Care 17 Years and Older, Female Preventive care refers to lifestyle choices and visits with your health care provider that can promote health and wellness. What does preventive care include?  A yearly physical exam. This is also called an annual well check.  Dental exams once or twice a year.  Routine eye exams. Ask your health care provider how often you should have your eyes checked.  Personal lifestyle choices, including:  Daily care of your teeth and gums.  Regular physical activity.  Eating a healthy diet.  Avoiding tobacco and drug use.  Limiting alcohol use.  Practicing safe sex.  Taking low-dose aspirin every day if recommended by your health care provider.  Taking  vitamin and mineral supplements as recommended by your health care provider. What happens during an annual well check? The services and screenings done by your health care provider during your annual well check will depend on your age, overall health, lifestyle risk factors, and family history of disease. Counseling  Your health care provider may ask you questions about your:  Alcohol use.  Tobacco use.  Drug use.  Emotional well-being.  Home and relationship well-being.  Sexual activity.  Eating habits.  History of falls.  Memory and ability to understand (cognition).  Work and work Statistician.  Reproductive health. Screening  You may have the following tests or measurements:  Height, weight, and BMI.  Blood pressure.  Lipid and cholesterol levels. These may be checked every 5 years, or more frequently if you are over 74 years old.  Skin check.  Lung cancer screening. You may have this screening every year starting at age 91 if you have a 30-pack-year history of smoking and currently smoke or have quit within the past 15 years.  Fecal occult blood test (FOBT) of the stool. You may have this test every year starting at age 68.  Flexible sigmoidoscopy or colonoscopy. You may have a sigmoidoscopy every 5 years or a colonoscopy every 10 years starting at age 21.  Hepatitis C blood test.  Hepatitis B blood test.  Sexually transmitted disease (STD) testing.  Diabetes screening. This is done by checking your blood sugar (glucose) after you have not eaten for a while (fasting). You may have this done every 1-3 years.  Bone density scan. This is done to screen for osteoporosis. You may have this done starting at age  65.  Mammogram. This may be done every 1-2 years. Talk to your health care provider about how often you should have regular mammograms. Talk with your health care provider about your test results, treatment options, and if necessary, the need for more  tests. Vaccines  Your health care provider may recommend certain vaccines, such as:  Influenza vaccine. This is recommended every year.  Tetanus, diphtheria, and acellular pertussis (Tdap, Td) vaccine. You may need a Td booster every 10 years.  Zoster vaccine. You may need this after age 24.  Pneumococcal 13-valent conjugate (PCV13) vaccine. One dose is recommended after age 1.  Pneumococcal polysaccharide (PPSV23) vaccine. One dose is recommended after age 10. Talk to your health care provider about which screenings and vaccines you need and how often you need them. This information is not intended to replace advice given to you by your health care provider. Make sure you discuss any questions you have with your health care provider. Document Released: 12/12/2015 Document Revised: 08/04/2016 Document Reviewed: 09/16/2015 Elsevier Interactive Patient Education  2017 [place] Prevention in the Home Falls can cause injuries. They can happen to people of all ages. There are many things you can do to make your home safe and to help prevent falls. What can I do on the outside of my home?  Regularly fix the edges of walkways and driveways and fix any cracks.  Remove anything that might make you trip as you walk through a door, such as a raised step or threshold.  Trim any bushes or trees on the path to your home.  Use bright outdoor lighting.  Clear any walking paths of anything that might make someone trip, such as rocks or tools.  Regularly check to see if handrails are loose or broken. Make sure that both sides of any steps have handrails.  Any raised decks and porches should have guardrails on the edges.  Have any leaves, snow, or ice cleared regularly.  Use sand or salt on walking paths during winter.  Clean up any spills in your garage right away. This includes oil or grease spills. What can I do in the bathroom?  Use night lights.  Install grab bars by the  toilet and in the tub and shower. Do not use towel bars as grab bars.  Use non-skid mats or decals in the tub or shower.  If you need to sit down in the shower, use a plastic, non-slip stool.  Keep the floor dry. Clean up any water that spills on the floor as soon as it happens.  Remove soap buildup in the tub or shower regularly.  Attach bath mats securely with double-sided non-slip rug tape.  Do not have throw rugs and other things on the floor that can make you trip. What can I do in the bedroom?  Use night lights.  Make sure that you have a light by your bed that is easy to reach.  Do not use any sheets or blankets that are too big for your bed. They should not hang down onto the floor.  Have a firm chair that has side arms. You can use this for support while you get dressed.  Do not have throw rugs and other things on the floor that can make you trip. What can I do in the kitchen?  Clean up any spills right away.  Avoid walking on wet floors.  Keep items that you use a lot in easy-to-reach places.  If you need  to reach something above you, use a strong step stool that has a grab bar.  Keep electrical cords out of the way.  Do not use floor polish or wax that makes floors slippery. If you must use wax, use non-skid floor wax.  Do not have throw rugs and other things on the floor that can make you trip. What can I do with my stairs?  Do not leave any items on the stairs.  Make sure that there are handrails on both sides of the stairs and use them. Fix handrails that are broken or loose. Make sure that handrails are as long as the stairways.  Check any carpeting to make sure that it is firmly attached to the stairs. Fix any carpet that is loose or worn.  Avoid having throw rugs at the top or bottom of the stairs. If you do have throw rugs, attach them to the floor with carpet tape.  Make sure that you have a light switch at the top of the stairs and the bottom of  the stairs. If you do not have them, ask someone to add them for you. What else can I do to help prevent falls?  Wear shoes that:  Do not have high heels.  Have rubber bottoms.  Are comfortable and fit you well.  Are closed at the toe. Do not wear sandals.  If you use a stepladder:  Make sure that it is fully opened. Do not climb a closed stepladder.  Make sure that both sides of the stepladder are locked into place.  Ask someone to hold it for you, if possible.  Clearly mark and make sure that you can see:  Any grab bars or handrails.  First and last steps.  Where the edge of each step is.  Use tools that help you move around (mobility aids) if they are needed. These include:  Canes.  Walkers.  Scooters.  Crutches.  Turn on the lights when you go into a dark area. Replace any light bulbs as soon as they burn out.  Set up your furniture so you have a clear path. Avoid moving your furniture around.  If any of your floors are uneven, fix them.  If there are any pets around you, be aware of where they are.  Review your medicines with your doctor. Some medicines can make you feel dizzy. This can increase your chance of falling. Ask your doctor what other things that you can do to help prevent falls. This information is not intended to replace advice given to you by your health care provider. Make sure you discuss any questions you have with your health care provider. Document Released: 09/11/2009 Document Revised: 04/22/2016 Document Reviewed: 12/20/2014 Elsevier Interactive Patient Education  2017 [employer].

## 2019-09-14 NOTE — Progress Notes (Signed)
Subjective:   Bailey [name] is a 85 y.o. female who presents for Medicare Annual (Subsequent) preventive examination.  Review of Systems:   Cardiac Risk Factors include: advanced age (>27men, >55 women);hypertension     Objective:     Vitals: BP 128/62 (BP Location: Left Arm, Patient Position: Sitting, Cuff Size: Normal)   Temp 97.7 F (36.5 C) (Temporal)   Ht 5\' 2"  (1.575 m)   Wt 113 lb 9.6 oz (51.5 kg)   LMP  (LMP Unknown)   BMI 20.78 kg/m   Body mass index is 20.78 kg/m.  Advanced Directives 09/14/2019 01/28/2018 12/09/2017 09/17/2016 01/21/2015 01/09/2015  Does Patient Have a Medical Advance Directive? Yes No Yes Yes Yes -  Type of Advance Directive Living will;Healthcare Power of [place]  Does patient want to make changes to medical advance directive? No - Patient declined - - - Yes - Spiritual care consult ordered -  Copy of [place] in Chart? No - copy requested - - - No - copy requested -    Tobacco Social History   Tobacco Use  Smoking Status Former Smoker  . Packs/day: 1.00  . Years: 9.00  . Pack years: 9.00  . Types: Cigarettes  . Quit date: 11/30/1963  . Years since quitting: 55.8  Smokeless Tobacco Never Used     Counseling given: Not Answered   Clinical Intake:  Pre-visit preparation completed: Yes  Pain : No/denies pain  Diabetes: No  How often do you need to have someone help you when you read instructions, pamphlets, or other written materials from your doctor or pharmacy?: 1 - Never  Interpreter Needed?: No  Information entered by :: Denman George LPN  Past Medical History:  Diagnosis Date  . Allergy, unspecified not elsewhere classified   . Anemia, unspecified   . Benign neoplasm of colon   . Cancer [facility])    basal/sqamous/transitional melanoma  . CKD (chronic kidney disease), stage III 12/08/2017  . COLONIC POLYPS 09/04/2008   Was told by Gi does not need anymore.    .  Diverticulosis of colon (without mention of hemorrhage)   . Dry eye syndrome   . Dysrhythmia    PVCs  . Esophageal reflux   . Hepatitis 1950s   infectious from food  . History of atrial fibrillation   . Hypertension   . Irritable bowel syndrome   . Mitral valve disorders(424.0)   . Moderate aortic regurgitation 12/08/2017  . Osteopenia 06/2018   T score -1.8 FRAX 10% / 2.5%  . Palpitations   . Premature ventricular contractions   . Rheumatoid arthritis(714.0)   . Sicca syndrome ([place])   . Ulcer 1992  . Vaginal delivery 1959, 1960, 1962, 1964   Past Surgical History:  Procedure Laterality Date  . APPENDECTOMY    . CATARACT EXTRACTION    . CHOLECYSTECTOMY    . colon tumor  1969   Benign rectal tumor excised  . DILATATION & CURETTAGE/HYSTEROSCOPY WITH MYOSURE N/A 01/21/2015   Procedure: DILATATION & CURETTAGE/HYSTEROSCOPY WITH MYOSURE;  Surgeon: [name], MD;  Location: [facility];  Service: Gynecology;  Laterality: N/A;  . HIATAL HERNIA REPAIR    . REFRACTIVE SURGERY    . ROTATOR CUFF REPAIR  2009   right by Dr. French Ana  . squamous cell removed  2013   Dr. Sherrye Payor --removed from her right cheek  . TUBAL LIGATION     Family History  Problem  Relation Age of Onset  . Stroke Mother   . Hypertension Mother   . Ulcerative colitis Father   . Heart attack Father   . Breast cancer Maternal Grandmother        Age 27's  . Uterine cancer Maternal Grandmother   . Hypertension Maternal Grandfather   . Colon cancer Paternal Grandmother    Social History   Socioeconomic History  . Marital status: Married    Spouse name: Doren Custard  . Number of children: 4  . Years of education: Not on file  . Highest education level: Not on file  Occupational History  . Not on file  Social Needs  . Financial resource strain: Not on file  . Food insecurity    Worry: Not on file    Inability: Not on file  . Transportation needs    Medical: Not on file    Non-medical: Not on file   Tobacco Use  . Smoking status: Former Smoker    Packs/day: 1.00    Years: 9.00    Pack years: 9.00    Types: Cigarettes    Quit date: 11/30/1963    Years since quitting: 55.8  . Smokeless tobacco: Never Used  Substance and Sexual Activity  . Alcohol use: Yes    Comment: generally drinks before dinner on the weekend   . Drug use: Never  . Sexual activity: Not Currently    Birth control/protection: Post-menopausal    Comment: 1st intercourse 45 yo-1 partner  Lifestyle  . Physical activity    Days per week: Not on file    Minutes per session: Not on file  . Stress: Not on file  Relationships  . Social Herbalist on phone: Not on file    Gets together: Not on file    Attends religious service: Not on file    Active member of club or organization: Not on file    Attends meetings of clubs or organizations: Not on file    Relationship status: Not on file  Other Topics Concern  . Not on file  Social History Narrative   Married 1958. 4 children. 10 grandkids (1 trying to go to medical school). No greatgrandkids.       Retired from nursing (medical and surgical, most recently in cardiology)   Degree in history and english after finished nursing- 2 mo before 72th birthday      Hobbies: education, travel, reading, piano     Outpatient Encounter Medications as of 09/14/2019  Medication Sig  . aspirin 81 MG tablet Take 81 mg by mouth daily.  . carvedilol (COREG) 3.125 MG tablet TAKE 2 TABLETS (6.25 MG TOTAL) IN MORNING AND 1 TABLET IN EVENING.  Marland Kitchen Cholecalciferol (VITAMIN D) 400 UNITS capsule Take 2 capsules by mouth daily   . cycloSPORINE (RESTASIS) 0.05 % ophthalmic emulsion Place 1 drop into both eyes 2 (two) times daily.   Marland Kitchen estrogen, conjugated,-medroxyprogesterone (PREMPRO) 0.45-1.5 MG tablet Take 1 tablet by mouth daily.  . fish oil-omega-3 fatty acids 1000 MG capsule Take one capsule by mouth  daily  . losartan (COZAAR) 50 MG tablet TAKE 1 TABLET BY MOUTH EVERY DAY    No facility-administered encounter medications on file as of 09/14/2019.     Activities of Daily Living In your present state of health, do you have any difficulty performing the following activities: 09/14/2019  Hearing? N  Vision? N  Difficulty concentrating or making decisions? N  Walking or climbing stairs? N  Dressing  or bathing? N  Doing errands, shopping? N  Preparing Food and eating ? N  Using the Toilet? N  In the past six months, have you accidently leaked urine? N  Do you have problems with loss of bowel control? N  Managing your Medications? N  Managing your Finances? N  Housekeeping or managing your Housekeeping? N  Some recent data might be hidden    Patient Care Team: Marin Olp, MD as PCP - General (Family Medicine) Josue Hector, MD as PCP - Cardiology (Cardiology) Fontaine, Belinda Block, MD as Consulting Physician (Gynecology) Bo Merino, MD as Consulting Physician (Rheumatology) Rutherford Guys, MD as Consulting Physician (Ophthalmology) Harriett Sine, MD as Consulting Physician (Dermatology)    Assessment:   This is a routine wellness examination for [place].  Exercise Activities and Dietary recommendations Current Exercise Habits: Home exercise routine, Type of exercise: walking, Time (Minutes): 30, Frequency (Times/Week): 3, Weekly Exercise (Minutes/Week): 90, Intensity: Mild  Goals    . Exercise 150 minutes per week (moderate activity)     Will get back to exercise !  Helps with balance  Will go back to PT    . Patient Stated     Monitor caregiver fatigue Caregiver support groups may be helpful  [employer]  [employer]; [phone] Management consultant; Information regarding Long Term Care List of caregiver groups as well as senior options in the community   Explore caregiver fatigue https://caregiver.com/articles/fighting_caregiver_fatigue/       Fall Risk Fall Risk  09/14/2019 12/09/2017  10/25/2017 09/17/2016 11/10/2015  Falls in the past year? 0 No No No No  Injury with Fall? 0 - - - -  Follow up Falls evaluation completed;Education provided;Falls prevention discussed - - - -   Is the patient's home free of loose throw rugs in walkways, pet beds, electrical cords, etc?   yes      Grab bars in the bathroom? yes      Handrails on the stairs?   yes      Adequate lighting?   yes  Timed Get Up and Go performed: completed and within normal timeframe; no gait abnormalities noted    Depression Screen PHQ 2/9 Scores 09/14/2019 12/07/2018 12/09/2017 10/25/2017  PHQ - 2 Score 0 0 0 0     Cognitive Function MMSE - Mini Mental State Exam 12/09/2017 09/17/2016  Not completed: (No Data) (No Data)     6CIT Screen 09/14/2019  What Year? 0 points  What month? 0 points  What time? 0 points  Count back from 20 0 points  Months in reverse 0 points  Repeat phrase 0 points  Total Score 0    Immunization History  Administered Date(s) Administered  . Fluad Quad(high Dose 65+) 08/21/2019  . H1N1 11/26/2008  . Influenza Split 08/30/2011, 10/31/2012  . Influenza Whole 09/05/2008, 08/27/2009, 08/20/2010  . Influenza, High Dose Seasonal PF 08/31/2016, 09/05/2017, 08/30/2018  . Influenza,inj,Quad PF,6+ Mos 09/13/2013, 10/04/2014, 10/01/2015  . Pneumococcal Conjugate-13 10/21/2014  . Pneumococcal Polysaccharide-23 10/11/2015  . Td 02/17/2010  . Zoster 11/29/2008    Qualifies for Shingles Vaccine?Discussed and patient will check with pharmacy for coverage.  Patient education handout provided    Screening Tests Health Maintenance  Topic Date Due  . TETANUS/TDAP  02/18/2020  . INFLUENZA VACCINE  Completed  . DEXA SCAN  Completed  . PNA vac Low Risk Adult  Completed    Cancer Screenings: Lung: Low Dose CT Chest recommended if Age 37-80 years, 30 pack-year currently smoking OR  have quit w/in 15years. Patient does not qualify. Breast:  Up to date on Mammogram? Yes   Up to date  of Bone Density/Dexa? Yes Colorectal: colonoscopy 07/14/12; no longer indicated     Plan:  I have personally reviewed and addressed the Medicare Annual Wellness questionnaire and have noted the following in the patient's chart:  A. Medical and social history B. Use of alcohol, tobacco or illicit drugs  C. Current medications and supplements D. Functional ability and status E.  Nutritional status F.  Physical activity G. Advance directives H. List of other physicians I.  Hospitalizations, surgeries, and ER visits in previous 12 months J.  [place] such as hearing and vision if needed, cognitive and depression L. Referrals, records requested, and appointments- none   In addition, I have reviewed and discussed with patient certain preventive protocols, quality metrics, and best practice recommendations. A written personalized care plan for preventive services as well as general preventive health recommendations were provided to patient.   Signed,  Denman George, LPN  Nurse Health Advisor   Nurse Notes: Patient with increased stress due to caregiver role of husband.  Discussed at length coping strategies and possible psychotherapy for her to be able to have an outlet. Patient education handouts provided and patient will be in touch if she would like to pursue referral.

## 2019-09-14 NOTE — Progress Notes (Signed)
I have personally reviewed the Medicare Annual Wellness Visit and agree with the assessment and plan.  Bailey [name]. Jerline Pain, MD 09/14/2019 1:40 PM

## 2019-11-13 ENCOUNTER — Ambulatory Visit: Payer: Medicare Other | Admitting: Rheumatology

## 2019-11-19 ENCOUNTER — Ambulatory Visit: Payer: Medicare Other | Admitting: Rheumatology

## 2019-12-06 ENCOUNTER — Ambulatory Visit: Payer: Medicare Other | Attending: Internal Medicine

## 2019-12-06 DIAGNOSIS — Z20822 Contact with and (suspected) exposure to covid-19: Secondary | ICD-10-CM | POA: Diagnosis not present

## 2019-12-07 ENCOUNTER — Ambulatory Visit (INDEPENDENT_AMBULATORY_CARE_PROVIDER_SITE_OTHER): Payer: Medicare Other | Admitting: Family Medicine

## 2019-12-07 ENCOUNTER — Encounter: Payer: Self-pay | Admitting: Family Medicine

## 2019-12-07 VITALS — BP 122/66 | HR 97 | Temp 96.9°F | Ht 62.0 in | Wt 112.0 lb

## 2019-12-07 DIAGNOSIS — I480 Paroxysmal atrial fibrillation: Secondary | ICD-10-CM

## 2019-12-07 DIAGNOSIS — M3501 Sicca syndrome with keratoconjunctivitis: Secondary | ICD-10-CM

## 2019-12-07 DIAGNOSIS — I10 Essential (primary) hypertension: Secondary | ICD-10-CM | POA: Diagnosis not present

## 2019-12-07 DIAGNOSIS — N183 Chronic kidney disease, stage 3 unspecified: Secondary | ICD-10-CM | POA: Diagnosis not present

## 2019-12-07 LAB — COMPREHENSIVE METABOLIC PANEL
ALT: 11 U/L (ref 0–35)
AST: 14 U/L (ref 0–37)
Albumin: 3.8 g/dL (ref 3.5–5.2)
Alkaline Phosphatase: 49 U/L (ref 39–117)
BUN: 19 mg/dL (ref 6–23)
CO2: 27 mEq/L (ref 19–32)
Calcium: 9.4 mg/dL (ref 8.4–10.5)
Chloride: 103 mEq/L (ref 96–112)
Creatinine, Ser: 1.02 mg/dL (ref 0.40–1.20)
GFR: 51.59 mL/min — ABNORMAL LOW (ref >60.00)
Glucose, Bld: 105 mg/dL — ABNORMAL HIGH (ref 70–99)
Potassium: 4.2 mEq/L (ref 3.5–5.1)
Sodium: 137 mEq/L (ref 135–145)
Total Bilirubin: 0.4 mg/dL (ref 0.2–1.2)
Total Protein: 6.6 g/dL (ref 6.0–8.3)

## 2019-12-07 LAB — CBC WITH DIFFERENTIAL/PLATELET
Basophils Absolute: 0 10*3/uL (ref 0.0–0.1)
Basophils Relative: 0.4 % (ref 0.0–3.0)
Eosinophils Absolute: 0.1 10*3/uL (ref 0.0–0.7)
Eosinophils Relative: 1.2 % (ref 0.0–5.0)
HCT: 36.1 % (ref 36.0–46.0)
Hemoglobin: 12 g/dL (ref 12.0–15.0)
Lymphocytes Relative: 17.2 % (ref 12.0–46.0)
Lymphs Abs: 1.9 10*3/uL (ref 0.7–4.0)
MCHC: 33.1 g/dL (ref 30.0–36.0)
MCV: 93.5 fl (ref 78.0–100.0)
Monocytes Absolute: 1 10*3/uL (ref 0.1–1.0)
Monocytes Relative: 9.2 % (ref 3.0–12.0)
Neutro Abs: 7.8 10*3/uL — ABNORMAL HIGH (ref 1.4–7.7)
Neutrophils Relative %: 72 % (ref 43.0–77.0)
Platelets: 307 10*3/uL (ref 150.0–400.0)
RBC: 3.86 Mil/uL — ABNORMAL LOW (ref 3.87–5.11)
RDW: 13.1 % (ref 11.5–15.5)
WBC: 10.9 10*3/uL — ABNORMAL HIGH (ref 4.0–10.5)

## 2019-12-07 LAB — LIPID PANEL
Cholesterol: 183 mg/dL (ref 0–200)
HDL: 90.4 mg/dL (ref >39.00)
LDL Cholesterol: 74 mg/dL (ref 0–99)
NonHDL: 92.42
Total CHOL/HDL Ratio: 2
Triglycerides: 90 mg/dL (ref 0.0–149.0)
VLDL: 18 mg/dL (ref 0.0–40.0)

## 2019-12-07 NOTE — Progress Notes (Signed)
Phone [phone] In person visit   Subjective:   Bailey [name] is a 86 y.o. year old very pleasant female patient who presents for/with See problem oriented charting Chief Complaint  Patient presents with  . Follow-up    ppw filled out     ROS-no chest pain or shortness of breath or palpitations reported.  This visit occurred during the SARS-CoV-2 public health emergency.  Safety protocols were in place, including screening questions prior to the visit, additional usage of staff PPE, and extensive cleaning of exam room while observing appropriate contact time as indicated for disinfecting solutions.   Past Medical History-  Patient Active Problem List   Diagnosis Date Noted  . Sjogren's syndrome with keratoconjunctivitis sicca ([place]) 05/12/2017    Priority: High  . Paroxysmal atrial fibrillation ([place]) 09/05/2008    Priority: High  . CKD (chronic kidney disease), stage III 12/08/2017    Priority: Medium  . Moderate aortic regurgitation 12/08/2017    Priority: Medium  . HTN (hypertension) 03/13/2015    Priority: Medium  . Raynauds syndrome 10/21/2014    Priority: Medium  . Primary osteoarthritis of both hands 08/21/2009    Priority: Medium  . PVC (premature ventricular contraction) 06/04/2009    Priority: Medium  . Mitral valve prolapse 12/25/2008    Priority: Medium  . Irritable bowel syndrome 09/04/2008    Priority: Medium  . Premature ventricular contractions     Priority: Low  . Hepatitis     Priority: Low  . History of skin cancer     Priority: Low  . Primary osteoarthritis of both feet 10/26/2016    Priority: Low  . Anemia 03/13/2015    Priority: Low  . 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 Edema 06/29/2011    Priority: Low  . Osteopenia 09/05/2008    Priority: Low  . GERD 09/04/2008    Priority: Low  . ALLERGY 09/04/2008    Priority: Low  . COLONIC POLYPS 09/04/2008    Priority: Low  . Allergy   . Balance disorder 10/26/2016     Medications- reviewed and updated Current Outpatient Medications  Medication Sig Dispense Refill  . aspirin 81 MG tablet Take 81 mg by mouth daily.    . carvedilol (COREG) 3.125 MG tablet TAKE 2 TABLETS (6.25 MG TOTAL) IN MORNING AND 1 TABLET IN EVENING. 270 tablet 1  . Cholecalciferol (VITAMIN D) 400 UNITS capsule Take 2 capsules by mouth daily     . cycloSPORINE (RESTASIS) 0.05 % ophthalmic emulsion Place 1 drop into both eyes 2 (two) times daily.     Marland Kitchen estrogen, conjugated,-medroxyprogesterone (PREMPRO) 0.45-1.5 MG tablet Take 1 tablet by mouth daily. 84 tablet 3  . fish oil-omega-3 fatty acids 1000 MG capsule Take one capsule by mouth  daily    . losartan (COZAAR) 50 MG tablet TAKE 1 TABLET BY MOUTH EVERY DAY 90 tablet 3  . clindamycin (CLEOCIN T) 1 % lotion APPLY A THIN LAYER TO SCALP TWICE A DAY AS NEEDED     No current facility-administered medications for this visit.     Objective:  BP 122/66   Pulse 97   Temp (!) 96.9 F (36.1 C) (Temporal)   Ht 5\' 2"  (1.575 m)   Wt 112 lb (50.8 kg)   LMP  (LMP Unknown)   SpO2 97%   BMI 20.49 kg/m  Gen: NAD, resting comfortably CV: RRR no murmurs rubs or gallops.  Appears to be in sinus rhythm Lungs: CTAB  no crackles, wheeze, rhonchi Ext: no edema Skin: warm, dry, no rash on visible skin    Assessment and Plan  #Social update-Patient in for forms to be filled out. Moving into [place] at [facility].  Had COVID-19 test yesterday before admission. Moves in next week- had been in home 46 years. Down from over 2000sq ft down to 900 sq ft. does deal with some anxiety with upcoming move-if had ongoing issues could consider SSRI  #hypertension/CKD stage III S: compliant with losartan 50 mg, carvedilol 6.5 mg in the morning and 3.25 mg in the evening  Creatinine has been stable around 0.9-1 but she is rather thin. Avoid nsaids.  A/P: blood pressure well controlled- continue current meds. CKD III hopefully stable- update cbc/cmp  today   #Paroxysmal atrial fibrillation/palpitations-follows with Dr. Johnsie Cancel #Moderate aortic regurgitation-follows with echocardiograms with cardiology.  Wears compression hose for edema. S: Patient previously with episodes of atrial fibrillation related to epinephrine use during dental procedures.  Has not had a known episode since 2014.  She has been continued on aspirin alone since she has been in sinus rhythm for some time.  She is on carvedilol for blood pressure which would offer some rate control if needed but is primarily used for blood pressure  Currently carvedilol helps with PACs/PVCs.   A/P:  Stable. Continue current medications.     Reviewed cardiology note from December 12, 2018 after visit-they noted a nonhealing lesion on right ankle-dermatology visit was advised-we will need to check in with patient next visit  % Sjogren's S: Follows closely with Dr. Estanislado Pandy.  On Restasis for dry eyes related to this A/P: Stable-continue rheumatology follow-up   Recommended follow up: Return in about 6 months (around 06/05/2020) for follow up- or sooner if needed. Future Appointments  Date Time Provider [place]  12/13/2019 11:20 AM [facility] ECHO 2 [id number]  12/20/2019 10:45 AM Bo Merino, MD [facility] None  12/25/2019  2:40 PM Marin Olp, MD [facility] P[facility]  12/28/2019  9:15 AM Josue Hector, MD [facility]  08/27/2020 10:00 AM Fontaine, Belinda Block, MD [facility]   Lab/Order associations: not fasting   ICD-10-CM   1. Essential hypertension  I10 CBC with Differential    Comprehensive metabolic panel    Lipid panel  2. Paroxysmal atrial fibrillation (HCC)  I48.0   3. Sjogren's syndrome with keratoconjunctivitis sicca (HCC) Chronic M35.01   4. Paroxysmal atrial fibrillation (HCC) Chronic I48.0    Return precautions advised.  Garret Reddish, MD

## 2019-12-07 NOTE — Patient Instructions (Addendum)
No changes today.   You can cancel visit on January 26th.   Please stop by lab before you go If you do not have mychart- we will call you about results within 5 business days of [place] receiving them.  If you have mychart- we will send your results within 3 business days of [place] receiving them.  If abnormal or we want to clarify a result, we will call or mychart you to make sure you receive the message.  If you have questions or concerns or don't hear within 5-7 days, please send [place] a message or call us.     Recommended follow up: No follow-ups on file.

## 2019-12-08 LAB — NOVEL CORONAVIRUS, NAA: SARS-CoV-2, NAA: NOT DETECTED

## 2019-12-13 ENCOUNTER — Other Ambulatory Visit (HOSPITAL_COMMUNITY): Payer: Medicare Other

## 2019-12-20 ENCOUNTER — Ambulatory Visit: Payer: Medicare Other | Admitting: Rheumatology

## 2019-12-20 DIAGNOSIS — Z23 Encounter for immunization: Secondary | ICD-10-CM | POA: Diagnosis not present

## 2019-12-24 NOTE — Progress Notes (Signed)
Patient ID: Bailey [name], female   DOB: 15-Mar-1933, 86 y.o.   MRN: [id number]    Virtual Visit via Video Note   This visit type was conducted due to national recommendations for restrictions regarding the COVID-19 Pandemic (e.g. social distancing) in an effort to limit this patient's exposure and mitigate transmission in our community.  Due to her co-morbid illnesses, this patient is at least at moderate risk for complications without adequate follow up.  This format is felt to be most appropriate for this patient at this time.  All issues noted in this document were discussed and addressed.  A limited physical exam was performed with this format.  Please refer to the patient's chart for her consent to telehealth for [facility].   Patient location: Home Physician Location: Office  86 y.o. history of PVC;s, HTN and moderate AR, Raynaud's and Sjorgrens Sees dermatology for chronic lichen sclerosis on right ankle   Last TTE done 12/04/18 reviewed EF 65-70% normal GLS -18.3 normal LV size Stable moderate AR   Home BP readings good and cuff accurate  Compliant with meds Some fatigue  Discussed f/u echo for degree of AR and LV size and function   Now living at [employer] as she had been in home for 46 years   She was able to get her vaccine last week with no issues TTE done 12/26/19 showed only mild AR and normal LV size and function   ROS: Denies fever, malais, weight loss, blurry vision, decreased visual acuity, cough, sputum, SOB, hemoptysis, pleuritic pain, palpitaitons, heartburn, abdominal pain, melena, lower extremity edema, claudication, or rash.  All other systems reviewed and negative  General: Vitals:   12/28/19 0910  BP: (!) 148/71  Pulse: 67   Video visit No distress No JVP elevation  No tachypnea  Skin warm and dry  No edema   Current Outpatient Medications  Medication Sig Dispense Refill  . aspirin 81 MG tablet Take 81 mg by mouth daily.    . carvedilol  (COREG) 3.125 MG tablet TAKE 2 TABLETS (6.25 MG TOTAL) IN MORNING AND 1 TABLET IN EVENING. 270 tablet 1  . Cholecalciferol (VITAMIN D) 400 UNITS capsule Take 2 capsules by mouth daily     . clindamycin (CLEOCIN T) 1 % lotion APPLY A THIN LAYER TO SCALP TWICE A DAY AS NEEDED    . cycloSPORINE (RESTASIS) 0.05 % ophthalmic emulsion Place 1 drop into both eyes 2 (two) times daily.     Marland Kitchen estrogen, conjugated,-medroxyprogesterone (PREMPRO) 0.45-1.5 MG tablet Take 1 tablet by mouth daily. 84 tablet 3  . fish oil-omega-3 fatty acids 1000 MG capsule Take one capsule by mouth  daily    . losartan (COZAAR) 50 MG tablet TAKE 1 TABLET BY MOUTH EVERY DAY 90 tablet 3   No current facility-administered medications for this visit.    Allergies  Diamox [acetazolamide], Epinephrine-lidocaine-na metabisulfite [lidocaine-epinephrine], Erythromycin, Penicillins, Sulfonamide derivatives, and Tape  Electrocardiogram:  09/04/14  SR rate 70  PVC otherwise normal  10/01/15  SR rate 69  Normal ECG  05/06/16 SR rate 62 LAD otherwise normal  06/06/17  NSR rate 67 nonspecific inferior ST changes   Assessment and Plan Palpitations:  PAC;s /PVC;s  Stable on beta blocker  AR:   Mild  by echo 12/26/19   LV compensated on ARB f/u echo in a year  HTN:  ARB and beta blocker increased March 2019  Edema:  Venous wearing compression stockings improved f/u dermatology  Chest pain:  Improved normal myovue 04/05/16  observe  Anxiety:  Husbands health and recent move to [place] f/u primary consider SSRI Husbands health is biggest stress  Derm:  Consider 2nd opinion regarding non healing lichen lesion on right ankle   F/U  In a year    Time spent on reviewing chart, writing note and direct patient interview via video 20 minutes   Jenkins Rouge

## 2019-12-24 NOTE — Progress Notes (Signed)
Virtual Visit via Telephone Note  I connected with Bailey [name] on 12/31/19 at 10:30 AM EST by telephone and verified that I am speaking with the correct person using two identifiers.  Location: Patient: Home Provider: Clinic  This service was conducted via virtual visit.    The patient was located at home. I was located in my office.  Consent was obtained prior to the virtual visit and is aware of possible charges through their insurance for this visit.  The patient is an established patient.  Dr. Estanislado Pandy, MD conducted the virtual visit and Hazel Sams, PA-C acted as scribe during the service.  Office staff helped with scheduling follow up visits after the service was conducted.     I discussed the limitations, risks, security and privacy concerns of performing an evaluation and management service by telephone and the availability of in person appointments. I also discussed with the patient that there may be a patient responsible charge related to this service. The patient expressed understanding and agreed to proceed.  CC: Lower back pain  History of Present Illness: Patient is a 86 year old female with a past medical history of Sjogren's, Raynaud's, and osteoarthritis.  She has been experiencing increased lower back pain, which she attributes to moving recently.  She denies any symptoms of radiculopathy.  She states her left elbow pain related to lateral epicondylitis has resolved. She has intermittent pain in both feet.  She has no joint swelling at this time. She has intermittent symptoms of Raynaud's in her fingers and toes. She takes aspirin 81 mg po daily.  She has chronic sicca symptoms and uses restasis.   Review of Systems  Constitutional: Positive for malaise/fatigue. Negative for fever.  HENT: Negative for ear pain.        +Dry mouth   Eyes: Negative for photophobia, pain, discharge and redness.       +Dry eyes  Respiratory: Negative for cough, shortness of breath and wheezing.    Cardiovascular: Negative for chest pain and palpitations.  Gastrointestinal: Negative for blood in stool, constipation and diarrhea.  Genitourinary: Negative for dysuria and urgency.  Musculoskeletal: Positive for back pain and joint pain. Negative for myalgias and neck pain.  Skin:       +Raynaud's  Neurological: Negative for dizziness, weakness and headaches.  Psychiatric/Behavioral: Negative for depression. The patient is nervous/anxious. The patient does not have insomnia.       Observations/Objective: Physical Exam  Constitutional: She is oriented to person, place, and time.  Neurological: She is alert and oriented to person, place, and time.  Psychiatric: Mood, memory, affect and judgment normal.   Patient reports morning stiffness for 30-60 minutes.   Patient denies nocturnal pain.  Difficulty dressing/grooming: Denies Difficulty climbing stairs: Denies Difficulty getting out of chair: Denies Difficulty using hands for taps, buttons, cutlery, and/or writing: Denies   Assessment and Plan: Visit Diagnoses: Sjogren's syndrome with keratoconjunctivitis sicca ([place]) - RF,-SPEP 11/17: She has chronic sicca symptoms, which have been stable. She uses restasis eye drops as prescribed and uses OTC products for symptomatic relief.  CBC and CMP were stable on 12/07/19.  She will follow up in 5-6 months.   Raynaud's disease without gangrene - Currently active.  She has not noticed any digital ulcerations or signs of gangrene.  She was encouraged to keep her core body temperature warm, drink warm fluids, and wear gloves/socks.    Chronic midline low back pain without sciatica -She has intermittent lower back, which was  recently exacerbated by moving.  She is not experiencing any symptoms of sciatica at this time.  Lateral epicondylitis, left elbow -Resolved   Primary osteoarthritis of both hands - She is not having any joint or inflammation at this time. She experiences occasional  cramps but no joint discomfort.   Primary osteoarthritis of both feet -She has intermittent pain in both feet.  No joint swelling.   Osteopenia of multiple sites - DEXA 07/26/18 L femoral neck T-score -1.8 Use of calcium vitamin D and resistive exercises were discussed.  She will be due to update DEXA in August 2021.   Other fatigue -Chronic but stable.   Other medical problems are listed as follows:  History of IBS   History of anemia   History of atrial fibrillation  History of hypertension   Balance disorder   History of gastroesophageal reflux (GERD)  Follow Up Instructions: She will follow up in 5-6 months.    I discussed the assessment and treatment plan with the patient. The patient was provided an opportunity to ask questions and all were answered. The patient agreed with the plan and demonstrated an understanding of the instructions.   The patient was advised to call back or seek an in-person evaluation if the symptoms worsen or if the condition fails to improve as anticipated.  I provided 15 minutes of non-face-to-face time during this encounter.   Bo Merino, MD   Scribed by-  Hazel Sams, PA-C

## 2019-12-25 ENCOUNTER — Ambulatory Visit: Payer: Medicare Other | Admitting: Family Medicine

## 2019-12-26 ENCOUNTER — Ambulatory Visit (HOSPITAL_COMMUNITY): Payer: Medicare Other | Attending: Cardiovascular Disease

## 2019-12-26 ENCOUNTER — Telehealth: Payer: Self-pay | Admitting: Cardiovascular Disease

## 2019-12-26 ENCOUNTER — Other Ambulatory Visit: Payer: Self-pay

## 2019-12-26 ENCOUNTER — Telehealth: Payer: Self-pay

## 2019-12-26 DIAGNOSIS — I351 Nonrheumatic aortic (valve) insufficiency: Secondary | ICD-10-CM | POA: Insufficient documentation

## 2019-12-26 NOTE — Telephone Encounter (Signed)
Patient aware of results.

## 2019-12-26 NOTE — Telephone Encounter (Signed)
Patient was returning a call from our office to go over her Echo results. Patient was concerned that something may be wrong because the echo was just this morning. She would like someone to call her today if possible

## 2019-12-28 ENCOUNTER — Encounter: Payer: Self-pay | Admitting: Cardiovascular Disease

## 2019-12-28 ENCOUNTER — Telehealth (INDEPENDENT_AMBULATORY_CARE_PROVIDER_SITE_OTHER): Payer: Medicare Other | Admitting: Cardiovascular Disease

## 2019-12-28 VITALS — BP 148/71 | HR 67 | Ht 62.0 in | Wt 110.6 lb

## 2019-12-28 DIAGNOSIS — I351 Nonrheumatic aortic (valve) insufficiency: Secondary | ICD-10-CM

## 2019-12-28 MED ORDER — CARVEDILOL 3.125 MG PO TABS: ORAL_TABLET | ORAL | 3 refills | Status: DC

## 2019-12-28 MED ORDER — LOSARTAN POTASSIUM 50 MG PO TABS: 50.0000 mg | ORAL_TABLET | Freq: Every day | ORAL | 3 refills | Status: DC

## 2019-12-28 NOTE — Patient Instructions (Addendum)
Medication Instructions:  *If you need a refill on your cardiac medications before your next appointment, please call your pharmacy*  Lab Work: If you have labs (blood work) drawn today and your tests are completely normal, you will receive your results only by: Marland Kitchen MyChart Message (if you have MyChart) OR . A paper copy in the mail If you have any lab test that is abnormal or we need to change your treatment, we will call you to review the results.  Testing/Procedures: Your physician has requested that you have an echocardiogram in 1 year. Echocardiography is a painless test that uses sound waves to create images of your heart. It provides your doctor with information about the size and shape of your heart and how well your heart's chambers and valves are working. This procedure takes approximately one hour. There are no restrictions for this procedure.  Follow-Up: At [facility], you and your health needs are our priority.  As part of our continuing mission to provide you with exceptional heart care, we have created designated Provider Care Teams.  These Care Teams include your primary Cardiologist (physician) and Advanced Practice Providers (APPs -  Physician Assistants and Nurse Practitioners) who all work together to provide you with the care you need, when you need it.  Your next appointment:   12 month(s)  The format for your next appointment:   In Person  Provider:   You may see Jenkins Rouge, MD or one of the following Advanced Practice Providers on your designated Care Team:    Truitt Merle, NP  Cecilie Kicks, NP  Kathyrn Drown, NP

## 2019-12-31 ENCOUNTER — Other Ambulatory Visit: Payer: Self-pay

## 2019-12-31 ENCOUNTER — Encounter: Payer: Self-pay | Admitting: Rheumatology

## 2019-12-31 ENCOUNTER — Telehealth (INDEPENDENT_AMBULATORY_CARE_PROVIDER_SITE_OTHER): Payer: Medicare Other | Admitting: Rheumatology

## 2019-12-31 DIAGNOSIS — M19072 Primary osteoarthritis, left ankle and foot: Secondary | ICD-10-CM

## 2019-12-31 DIAGNOSIS — M7541 Impingement syndrome of right shoulder: Secondary | ICD-10-CM

## 2019-12-31 DIAGNOSIS — M19042 Primary osteoarthritis, left hand: Secondary | ICD-10-CM

## 2019-12-31 DIAGNOSIS — I7300 Raynaud's syndrome without gangrene: Secondary | ICD-10-CM | POA: Diagnosis not present

## 2019-12-31 DIAGNOSIS — M19071 Primary osteoarthritis, right ankle and foot: Secondary | ICD-10-CM | POA: Diagnosis not present

## 2019-12-31 DIAGNOSIS — Z8679 Personal history of other diseases of the circulatory system: Secondary | ICD-10-CM | POA: Diagnosis not present

## 2019-12-31 DIAGNOSIS — M3501 Sicca syndrome with keratoconjunctivitis: Secondary | ICD-10-CM

## 2019-12-31 DIAGNOSIS — Z8719 Personal history of other diseases of the digestive system: Secondary | ICD-10-CM

## 2019-12-31 DIAGNOSIS — M8589 Other specified disorders of bone density and structure, multiple sites: Secondary | ICD-10-CM

## 2019-12-31 DIAGNOSIS — M19041 Primary osteoarthritis, right hand: Secondary | ICD-10-CM

## 2019-12-31 DIAGNOSIS — M533 Sacrococcygeal disorders, not elsewhere classified: Secondary | ICD-10-CM

## 2019-12-31 DIAGNOSIS — Z862 Personal history of diseases of the blood and blood-forming organs and certain disorders involving the immune mechanism: Secondary | ICD-10-CM | POA: Diagnosis not present

## 2019-12-31 DIAGNOSIS — R5383 Other fatigue: Secondary | ICD-10-CM

## 2019-12-31 DIAGNOSIS — G8929 Other chronic pain: Secondary | ICD-10-CM

## 2019-12-31 DIAGNOSIS — M7712 Lateral epicondylitis, left elbow: Secondary | ICD-10-CM

## 2020-01-13 ENCOUNTER — Other Ambulatory Visit: Payer: Self-pay | Admitting: Cardiovascular Disease

## 2020-01-17 DIAGNOSIS — Z23 Encounter for immunization: Secondary | ICD-10-CM | POA: Diagnosis not present

## 2020-05-12 NOTE — Telephone Encounter (Signed)
Sent consent through [place].

## 2020-06-05 NOTE — Progress Notes (Signed)
Phone [phone] In person visit   Subjective:   Bailey [name] is a 86 y.o. year old very pleasant female patient who presents for/with See problem oriented charting Chief Complaint  Patient presents with  . Follow-up  . Hypertension    This visit occurred during the SARS-CoV-2 public health emergency.  Safety protocols were in place, including screening questions prior to the visit, additional usage of staff PPE, and extensive cleaning of exam room while observing appropriate contact time as indicated for disinfecting solutions.   Past Medical History-  Patient Active Problem List   Diagnosis Date Noted  . Sjogren's syndrome with keratoconjunctivitis sicca ([place]) 05/12/2017    Priority: High  . Paroxysmal atrial fibrillation ([place]) 09/05/2008    Priority: High  . CKD (chronic kidney disease), stage III 12/08/2017    Priority: Medium  . Moderate aortic regurgitation 12/08/2017    Priority: Medium  . HTN (hypertension) 03/13/2015    Priority: Medium  . Raynauds syndrome 10/21/2014    Priority: Medium  . Primary osteoarthritis of both hands 08/21/2009    Priority: Medium  . PVC (premature ventricular contraction) 06/04/2009    Priority: Medium  . Mitral valve prolapse 12/25/2008    Priority: Medium  . Irritable bowel syndrome 09/04/2008    Priority: Medium  . Premature ventricular contractions     Priority: Low  . Hepatitis     Priority: Low  . History of skin cancer     Priority: Low  . Primary osteoarthritis of both feet 10/26/2016    Priority: Low  . Anemia 03/13/2015    Priority: Low  . 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 Edema 06/29/2011    Priority: Low  . Osteopenia 09/05/2008    Priority: Low  . GERD 09/04/2008    Priority: Low  . ALLERGY 09/04/2008    Priority: Low  . COLONIC POLYPS 09/04/2008    Priority: Low  . Allergy   . Balance disorder 10/26/2016    Medications- reviewed and updated Current Outpatient Medications    Medication Sig Dispense Refill  . aspirin 81 MG tablet Take 81 mg by mouth daily.    . carvedilol (COREG) 3.125 MG tablet Take 2 tablets by mouth in the am and 1 tablet by mouth in the evening. 270 tablet 3  . Cholecalciferol (VITAMIN D) 400 UNITS capsule Take 2 capsules by mouth daily     . clindamycin (CLEOCIN T) 1 % lotion APPLY A THIN LAYER TO SCALP TWICE A DAY AS NEEDED    . cycloSPORINE (RESTASIS) 0.05 % ophthalmic emulsion Place 1 drop into both eyes 2 (two) times daily.     Marland Kitchen estrogen, conjugated,-medroxyprogesterone (PREMPRO) 0.45-1.5 MG tablet Take 1 tablet by mouth daily. 84 tablet 3  . fish oil-omega-3 fatty acids 1000 MG capsule Take one capsule by mouth  daily    . losartan (COZAAR) 50 MG tablet TAKE 1 TABLET BY MOUTH EVERY DAY 90 tablet 3  . doxycycline (VIBRA-TABS) 100 MG tablet Take 1 tablet (100 mg total) by mouth 2 (two) times daily for 7 days. 14 tablet 0   No current facility-administered medications for this visit.     Objective:  BP 140/60   Pulse 68   Temp 98.6 F (37 C)   Ht 5\' 2"  (1.575 m)   Wt 108 lb 12.8 oz (49.4 kg)   LMP  (LMP Unknown)   SpO2 97%   BMI 19.90 kg/m  Gen: NAD, resting comfortably CV: RRR  no murmurs rubs or gallops Lungs: CTAB no crackles, wheeze, rhonchi Abdomen: soft/nontender/nondistended/normal bowel sounds.  Ext: no edema Skin: warm, dry      Assessment and Plan   #Social update-. Moved into [place] at [facility] early 2021.  - she is doing a lot of care giving- helping husband dress, shower, etc- she is not sure how long she will be able to do that. A lot of stress in this role.   #hypertension/CKD stage III S: compliant with losartan 50 mg, carvedilol 6.5 mg in the morning and 3.25 mg in the evening  Carvedilol helps with PVCs as well BP Readings from Last 3 Encounters:  06/06/20 140/60  12/28/19 (!) 148/71  12/07/19 122/66   Creatinine has been stable around 0.9-1 but she is rather thin. Avoid nsaids.  A/P:  Hypertension-slightly elevated-  Continue current medications for now- asked her to check when she gets home and let us know how blood pressure is doing . She is due for noon losartan so that could contribute  CKD stage III-hopefully stable-update CBC/CMP today   #Paroxysmal atrial fibrillation/palpitations-follows with Dr. Johnsie Cancel #Moderate aortic regurgitation-follows with echocardiograms with cardiology.  Wears compression hose for edema. S: Patient previously with episodes of atrial fibrillation related to epinephrine use during dental procedures.  Has not had a known episode since 2014.  She has been continued on aspirin alone since she has been in sinus rhythm for some time.  She is on carvedilol for blood pressure which would offer some rate control if needed but is primarily used for blood pressure A/P: Appears to still be in sinus rhythm though obviously cannot prevent without EKG.  Continue aspirin.  Continue carvedilol for blood pressure but would also offer some rate control and helps with PVCs  Aortic regurgitation stable on echocardiogram-noted as mild last check Dr. Alison Murray was normal   A/P: a fib and aortic regurgitation are stable- continue cardiology follow up and current meds   % Sjogren's- dry mouth ([place]) and dry eyes S: Follows closely with Dr. Kerry Dory seen in February.  On Restasis for dry eyes related to this A/P: Stable. Continue current medications.    #Leukocytosis-mild elevation in white blood cells last visit-update with labs today  #Osteopenia-worst T score was -1.8 left femur in August 2019.  Compliant with calcium and vitamin D.  Recheck bone density in next year  Fortunately stable at last visit - may get sooner with Dr. Phineas Real  #nasal congestion/rhinorrhea-9 months of issues.  denies any pain or discomfort. Prior dental work on left upper teeth- but has come and go. Dentist did not think related to teeth.  occasional yellow discharge. flonase has  not helped  -doxycycline trial in case sinusitis. PCN allergy.   Recommended follow up: 6 months Future Appointments  Date Time Provider [place]  06/26/2020  2:00 PM Bo Merino, MD [facility] None  08/27/2020 10:00 AM Joseph Pierini, MD [facility]    Lab/Order associations:   ICD-10-CM   1. Essential hypertension  I10 CBC with Differential/Platelet    Comprehensive metabolic panel  2. Osteopenia, unspecified location  M85.80   3. Paroxysmal atrial fibrillation (HCC)  I48.0   4. PVC (premature ventricular contraction)  I49.3   5. Moderate aortic regurgitation  I35.1   6. Stage 3 chronic kidney disease, unspecified whether stage 3a or 3b CKD  N18.30     Meds ordered this encounter  Medications  . doxycycline (VIBRA-TABS) 100 MG tablet    Sig: Take 1 tablet (  100 mg total) by mouth 2 (two) times daily for 7 days.    Dispense:  14 tablet    Refill:  0     Return precautions advised.  Garret Reddish, MD

## 2020-06-05 NOTE — Patient Instructions (Addendum)
Please stop by lab before you go If you have mychart- we will send your results within 3 business days of [place] receiving them.  If you do not have mychart- we will call you about results within 5 business days of [place] receiving them.   Start Doxycycline for 7 days for sinuses.  Check blood pressures at home and let us know readings. Goal <140/90 on average so 138/88 or less  Advised Tdap at pharmacy

## 2020-06-06 ENCOUNTER — Ambulatory Visit (INDEPENDENT_AMBULATORY_CARE_PROVIDER_SITE_OTHER): Payer: Medicare Other | Admitting: Family Medicine

## 2020-06-06 ENCOUNTER — Other Ambulatory Visit: Payer: Self-pay

## 2020-06-06 ENCOUNTER — Encounter: Payer: Self-pay | Admitting: Family Medicine

## 2020-06-06 VITALS — BP 140/60 | HR 68 | Temp 98.6°F | Ht 62.0 in | Wt 108.8 lb

## 2020-06-06 DIAGNOSIS — I10 Essential (primary) hypertension: Secondary | ICD-10-CM

## 2020-06-06 DIAGNOSIS — M8580 Other specified disorders of bone density and structure, unspecified site: Secondary | ICD-10-CM

## 2020-06-06 DIAGNOSIS — I493 Ventricular premature depolarization: Secondary | ICD-10-CM

## 2020-06-06 DIAGNOSIS — I480 Paroxysmal atrial fibrillation: Secondary | ICD-10-CM | POA: Diagnosis not present

## 2020-06-06 DIAGNOSIS — N183 Chronic kidney disease, stage 3 unspecified: Secondary | ICD-10-CM

## 2020-06-06 DIAGNOSIS — I351 Nonrheumatic aortic (valve) insufficiency: Secondary | ICD-10-CM

## 2020-06-06 LAB — COMPREHENSIVE METABOLIC PANEL
ALT: 10 U/L (ref 0–35)
AST: 14 U/L (ref 0–37)
Albumin: 4 g/dL (ref 3.5–5.2)
Alkaline Phosphatase: 49 U/L (ref 39–117)
BUN: 20 mg/dL (ref 6–23)
CO2: 27 mEq/L (ref 19–32)
Calcium: 9.7 mg/dL (ref 8.4–10.5)
Chloride: 102 mEq/L (ref 96–112)
Creatinine, Ser: 0.93 mg/dL (ref 0.40–1.20)
GFR: 57.32 mL/min — ABNORMAL LOW (ref >60.00)
Glucose, Bld: 93 mg/dL (ref 70–99)
Potassium: 4.2 mEq/L (ref 3.5–5.1)
Sodium: 137 mEq/L (ref 135–145)
Total Bilirubin: 0.5 mg/dL (ref 0.2–1.2)
Total Protein: 6.7 g/dL (ref 6.0–8.3)

## 2020-06-06 LAB — CBC WITH DIFFERENTIAL/PLATELET
Basophils Absolute: 0.1 10*3/uL (ref 0.0–0.1)
Basophils Relative: 0.8 % (ref 0.0–3.0)
Eosinophils Absolute: 0.1 10*3/uL (ref 0.0–0.7)
Eosinophils Relative: 1 % (ref 0.0–5.0)
HCT: 34.5 % — ABNORMAL LOW (ref 36.0–46.0)
Hemoglobin: 11.7 g/dL — ABNORMAL LOW (ref 12.0–15.0)
Lymphocytes Relative: 20 % (ref 12.0–46.0)
Lymphs Abs: 2 10*3/uL (ref 0.7–4.0)
MCHC: 34.1 g/dL (ref 30.0–36.0)
MCV: 92.5 fl (ref 78.0–100.0)
Monocytes Absolute: 0.8 10*3/uL (ref 0.1–1.0)
Monocytes Relative: 8.2 % (ref 3.0–12.0)
Neutro Abs: 6.9 10*3/uL (ref 1.4–7.7)
Neutrophils Relative %: 70 % (ref 43.0–77.0)
Platelets: 296 10*3/uL (ref 150.0–400.0)
RBC: 3.73 Mil/uL — ABNORMAL LOW (ref 3.87–5.11)
RDW: 12.7 % (ref 11.5–15.5)
WBC: 9.9 10*3/uL (ref 4.0–10.5)

## 2020-06-06 MED ORDER — DOXYCYCLINE HYCLATE 100 MG PO TABS: 100.0000 mg | ORAL_TABLET | Freq: Two times a day (BID) | ORAL | 0 refills | Status: AC

## 2020-06-11 ENCOUNTER — Encounter: Payer: Self-pay | Admitting: Family Medicine

## 2020-06-11 NOTE — Telephone Encounter (Signed)
fyi

## 2020-06-13 NOTE — Progress Notes (Deleted)
Office Visit Note  Patient: Bailey [name]             Date of Birth: [birth year]           MRN: [id number]             PCP: Marin Olp, MD Referring: Marin Olp, MD Visit Date: 06/26/2020 Occupation: @GUAROCC @  Subjective:  No chief complaint on file.   History of Present Illness: Bailey [name] is a 86 y.o. female ***   Activities of Daily Living:  Patient reports morning stiffness for *** {minute/hour:19697}.   Patient {ACTIONS;DENIES/REPORTS:21021675::"Denies"} nocturnal pain.  Difficulty dressing/grooming: {ACTIONS;DENIES/REPORTS:21021675::"Denies"} Difficulty climbing stairs: {ACTIONS;DENIES/REPORTS:21021675::"Denies"} Difficulty getting out of chair: {ACTIONS;DENIES/REPORTS:21021675::"Denies"} Difficulty using hands for taps, buttons, cutlery, and/or writing: {ACTIONS;DENIES/REPORTS:21021675::"Denies"}  No Rheumatology ROS completed.   PMFS History:  Patient Active Problem List   Diagnosis Date Noted  . Allergy   . CKD (chronic kidney disease), stage III 12/08/2017  . Moderate aortic regurgitation 12/08/2017  . Premature ventricular contractions   . Hepatitis   . History of skin cancer   . Sjogren's syndrome with keratoconjunctivitis sicca ([place]) 05/12/2017  . Balance disorder 10/26/2016  . Primary osteoarthritis of both feet 10/26/2016  . HTN (hypertension) 03/13/2015  . Anemia 03/13/2015  . Raynauds syndrome 10/21/2014  . Recurrent cold sores 10/21/2014  . Edema 06/29/2011  . Primary osteoarthritis of both hands 08/21/2009  . PVC (premature ventricular contraction) 06/04/2009  . Mitral valve prolapse 12/25/2008  . Paroxysmal atrial fibrillation ([place]) 09/05/2008  . Osteopenia 09/05/2008  . GERD 09/04/2008  . Irritable bowel syndrome 09/04/2008  . ALLERGY 09/04/2008  . COLONIC POLYPS 09/04/2008    Past Medical History:  Diagnosis Date  . Allergy, unspecified not elsewhere classified   . Anemia, unspecified   . Benign neoplasm of colon   .  Cancer [facility])    basal/sqamous/transitional melanoma  . CKD (chronic kidney disease), stage III 12/08/2017  . COLONIC POLYPS 09/04/2008   Was told by Gi does not need anymore.    . Diverticulosis of colon (without mention of hemorrhage)   . Dry eye syndrome   . Dysrhythmia    PVCs  . Esophageal reflux   . Hepatitis 1950s   infectious from food  . History of atrial fibrillation   . Hypertension   . Irritable bowel syndrome   . Mitral valve disorders(424.0)   . Moderate aortic regurgitation 12/08/2017  . Osteopenia 06/2018   T score -1.8 FRAX 10% / 2.5%  . Palpitations   . Premature ventricular contractions   . Rheumatoid arthritis(714.0)   . Sicca syndrome ([place])   . Ulcer 1992  . Vaginal delivery 1959, 1960, 1962, 1964    Family History  Problem Relation Age of Onset  . Stroke Mother   . Hypertension Mother   . Ulcerative colitis Father   . Heart attack Father   . Breast cancer Maternal Grandmother        Age 65's  . Uterine cancer Maternal Grandmother   . Hypertension Maternal Grandfather   . Colon cancer Paternal Grandmother    Past Surgical History:  Procedure Laterality Date  . APPENDECTOMY    . CATARACT EXTRACTION    . CHOLECYSTECTOMY    . colon tumor  1969   Benign rectal tumor excised  . DILATATION & CURETTAGE/HYSTEROSCOPY WITH MYOSURE N/A 01/21/2015   Procedure: DILATATION & CURETTAGE/HYSTEROSCOPY WITH MYOSURE;  Surgeon: [name], MD;  Location: [facility];  Service: Gynecology;  Laterality: N/A;  .  HIATAL HERNIA REPAIR    . REFRACTIVE SURGERY    . ROTATOR CUFF REPAIR  2009   right by Dr. French Ana  . squamous cell removed  2013   Dr. Sherrye Payor --removed from her right cheek  . TUBAL LIGATION     Social History   Social History Narrative   Married 1958. 4 children. 10 grandkids (1 trying to go to medical school). No greatgrandkids.       Retired from nursing (medical and surgical, most recently in cardiology)   Degree in history and english after  finished nursing- 2 mo before 72th birthday      Hobbies: education, travel, reading, piano    Immunization History  Administered Date(s) Administered  . Fluad Quad(high Dose 65+) 08/21/2019  . H1N1 11/26/2008  . Influenza Split 08/30/2011, 10/31/2012  . Influenza Whole 09/05/2008, 08/27/2009, 08/20/2010  . Influenza, High Dose Seasonal PF 08/31/2016, 09/05/2017, 08/30/2018  . Influenza,inj,Quad PF,6+ Mos 09/13/2013, 10/04/2014, 10/01/2015  . Moderna SARS-COVID-2 Vaccination 12/20/2019, 01/17/2020  . Pneumococcal Conjugate-13 10/21/2014  . Pneumococcal Polysaccharide-23 10/11/2015  . Td 02/17/2010  . Zoster 11/29/2008     Objective: Vital Signs: LMP  (LMP Unknown)    Physical Exam   Musculoskeletal Exam: ***  CDAI Exam: CDAI Score: -- Patient Global: --; Provider Global: -- Swollen: --; Tender: -- Joint Exam 06/26/2020   No joint exam has been documented for this visit   There is currently no information documented on the homunculus. Go to the Rheumatology activity and complete the homunculus joint exam.  Investigation: No additional findings.  Imaging: No results found.  Recent Labs: Lab Results  Component Value Date   WBC 9.9 06/06/2020   HGB 11.7 (L) 06/06/2020   PLT 296.0 06/06/2020   NA 137 06/06/2020   K 4.2 06/06/2020   CL 102 06/06/2020   CO2 27 06/06/2020   GLUCOSE 93 06/06/2020   BUN 20 06/06/2020   CREATININE 0.93 06/06/2020   BILITOT 0.5 06/06/2020   ALKPHOS 49 06/06/2020   AST 14 06/06/2020   ALT 10 06/06/2020   PROT 6.7 06/06/2020   ALBUMIN 4.0 06/06/2020   CALCIUM 9.7 06/06/2020   GFRAA 61 11/14/2018    Speciality Comments: No specialty comments available.  Procedures:  No procedures performed Allergies: Diamox [acetazolamide], Epinephrine-lidocaine-na metabisulfite [lidocaine-epinephrine], Erythromycin, Penicillins, Sulfonamide derivatives, and Tape   Assessment / Plan:     Visit Diagnoses: No diagnosis found.  Orders: No  orders of the defined types were placed in this encounter.  No orders of the defined types were placed in this encounter.   Face-to-face time spent with patient was *** minutes. Greater than 50% of time was spent in counseling and coordination of care.  Follow-Up Instructions: No follow-ups on file.   Earnestine Mealing, CMA  Note - This record has been created using Editor, commissioning.  Chart creation errors have been sought, but may not always  have been located. Such creation errors do not reflect on  the standard of medical care.

## 2020-06-26 ENCOUNTER — Ambulatory Visit: Payer: Self-pay | Admitting: Rheumatology

## 2020-07-03 NOTE — Progress Notes (Signed)
Office Visit Note  Patient: Bailey [name]             Date of Birth: 1933-01-25           MRN: [id number]             PCP: Marin Olp, MD Referring: Marin Olp, MD Visit Date: 07/15/2020 Occupation: @GUAROCC @  Subjective:  Other (right 5th digit pain/swelling )   History of Present Illness: Bailey [name] is a 86 y.o. female with history of Sjogren's, and osteoarthritis. She states she continues to have dry mouth symptoms. She has been using over-the-counter products which has been helpful. She has osteoarthritis in multiple joints and continues to have discomfort. She states recently she has been having discomfort in her right 5th finger which she describes over the DIP joint. She states she has discomfort in some of the joints would subside after a few days. She is not having much discomfort in her feet. She has been also experiencing some lower back pain. She denies any radiculopathy. She states she has been doing some exercises which are helpful. She has off-and-on epicondylitis. She takes care of her husband and that puts extra stress on her joints and especially her back.  Activities of Daily Living:  Patient reports morning stiffness for  30 minutes.   Patient Denies nocturnal pain.  Difficulty dressing/grooming: Denies Difficulty climbing stairs: Denies Difficulty getting out of chair: Denies Difficulty using hands for taps, buttons, cutlery, and/or writing: Denies  Review of Systems  Constitutional: Positive for fatigue.  HENT: Positive for mouth sores and mouth dryness. Negative for nose dryness.   Eyes: Positive for dryness.  Respiratory: Negative for shortness of breath and difficulty breathing.   Cardiovascular: Negative for chest pain and palpitations.  Gastrointestinal: Negative for blood in stool, constipation and diarrhea.  Endocrine: Negative for increased urination.  Genitourinary: Negative for difficulty urinating.  Musculoskeletal: Positive for  arthralgias, joint pain, joint swelling, myalgias, morning stiffness, muscle tenderness and myalgias.  Skin: Negative for color change, rash and redness.  Allergic/Immunologic: Negative for susceptible to infections.  Neurological: Negative for dizziness, numbness, headaches, memory loss and weakness.  Hematological: Positive for bruising/bleeding tendency.  Psychiatric/Behavioral: Negative for confusion.    PMFS History:  Patient Active Problem List   Diagnosis Date Noted  . Allergy   . CKD (chronic kidney disease), stage III 12/08/2017  . Moderate aortic regurgitation 12/08/2017  . Premature ventricular contractions   . Hepatitis   . History of skin cancer   . Sjogren's syndrome with keratoconjunctivitis sicca ([place]) 05/12/2017  . Balance disorder 10/26/2016  . Primary osteoarthritis of both feet 10/26/2016  . HTN (hypertension) 03/13/2015  . Anemia 03/13/2015  . Raynauds syndrome 10/21/2014  . Recurrent cold sores 10/21/2014  . Edema 06/29/2011  . Primary osteoarthritis of both hands 08/21/2009  . PVC (premature ventricular contraction) 06/04/2009  . Mitral valve prolapse 12/25/2008  . Paroxysmal atrial fibrillation ([place]) 09/05/2008  . Osteopenia 09/05/2008  . GERD 09/04/2008  . Irritable bowel syndrome 09/04/2008  . ALLERGY 09/04/2008  . COLONIC POLYPS 09/04/2008    Past Medical History:  Diagnosis Date  . Allergy, unspecified not elsewhere classified   . Anemia, unspecified   . Benign neoplasm of colon   . Cancer [facility])    basal/sqamous/transitional melanoma  . CKD (chronic kidney disease), stage III 12/08/2017  . COLONIC POLYPS 09/04/2008   Was told by Gi does not need anymore.    . Diverticulosis of colon (without  mention of hemorrhage)   . Dry eye syndrome   . Dysrhythmia    PVCs  . Esophageal reflux   . Hepatitis 1950s   infectious from food  . History of atrial fibrillation   . Hypertension   . Irritable bowel syndrome   . Mitral valve disorders(424.0)     . Moderate aortic regurgitation 12/08/2017  . Osteopenia 06/2018   T score -1.8 FRAX 10% / 2.5%  . Palpitations   . Premature ventricular contractions   . Rheumatoid arthritis(714.0)   . Sicca syndrome ([place])   . Ulcer 1992  . Vaginal delivery 1959, 1960, 1962, 1964    Family History  Problem Relation Age of Onset  . Stroke Mother   . Hypertension Mother   . Ulcerative colitis Father   . Heart attack Father   . Breast cancer Maternal Grandmother        Age 14's  . Uterine cancer Maternal Grandmother   . Hypertension Maternal Grandfather   . Colon cancer Paternal Grandmother    Past Surgical History:  Procedure Laterality Date  . APPENDECTOMY    . CATARACT EXTRACTION    . CHOLECYSTECTOMY    . colon tumor  1969   Benign rectal tumor excised  . DILATATION & CURETTAGE/HYSTEROSCOPY WITH MYOSURE N/A 01/21/2015   Procedure: DILATATION & CURETTAGE/HYSTEROSCOPY WITH MYOSURE;  Surgeon: [name], MD;  Location: [facility];  Service: Gynecology;  Laterality: N/A;  . HIATAL HERNIA REPAIR    . REFRACTIVE SURGERY    . ROTATOR CUFF REPAIR  2009   right by Dr. French Ana  . squamous cell removed  2013   Dr. Sherrye Payor --removed from her right cheek  . TUBAL LIGATION     Social History   Social History Narrative   Married 1958. 4 children. 10 grandkids (1 trying to go to medical school). No greatgrandkids.       Retired from nursing (medical and surgical, most recently in cardiology)   Degree in history and english after finished nursing- 2 mo before 72th birthday      Hobbies: education, travel, reading, piano    Immunization History  Administered Date(s) Administered  . Fluad Quad(high Dose 65+) 08/21/2019  . H1N1 11/26/2008  . Influenza Split 08/30/2011, 10/31/2012  . Influenza Whole 09/05/2008, 08/27/2009, 08/20/2010  . Influenza, High Dose Seasonal PF 08/31/2016, 09/05/2017, 08/30/2018  . Influenza,inj,Quad PF,6+ Mos 09/13/2013, 10/04/2014, 10/01/2015  . Moderna  SARS-COVID-2 Vaccination 12/20/2019, 01/17/2020  . Pneumococcal Conjugate-13 10/21/2014  . Pneumococcal Polysaccharide-23 10/11/2015  . Td 02/17/2010  . Zoster 11/29/2008     Objective: Vital Signs: BP (!) 143/74 (BP Location: Left Arm, Patient Position: Sitting, Cuff Size: Normal)   Pulse 73   Resp 14   Ht 5\' 2"  (1.575 m)   Wt 109 lb 6.4 oz (49.6 kg)   LMP  (LMP Unknown)   BMI 20.01 kg/m    Physical Exam Vitals and nursing note reviewed.  Constitutional:      Appearance: She is well-developed.  HENT:     Head: Normocephalic and atraumatic.  Eyes:     Conjunctiva/sclera: Conjunctivae normal.  Cardiovascular:     Rate and Rhythm: Normal rate and regular rhythm.     Heart sounds: Normal heart sounds.  Pulmonary:     Effort: Pulmonary effort is normal.     Breath sounds: Normal breath sounds.  Abdominal:     General: Bowel sounds are normal.     Palpations: Abdomen is soft.  Musculoskeletal:  Cervical back: Normal range of motion.  Lymphadenopathy:     Cervical: No cervical adenopathy.  Skin:    General: Skin is warm and dry.     Capillary Refill: Capillary refill takes less than 2 seconds.  Neurological:     Mental Status: She is alert and oriented to person, place, and time.  Psychiatric:        Behavior: Behavior normal.      Musculoskeletal Exam: C-spine was in good range of motion. She had no tenderness over thoracic or lumbar spine. Shoulder joints, elbow joints, wrist joints with good range of motion. She had DIP and PIP thickening bilaterally but no synovitis. Right 5th digit DIP was thickened but no synovitis was noted. Hip joints and knee joints in good range of motion. She had no tenderness over MTPs or PIPs.  CDAI Exam: CDAI Score: -- Patient Global: --; Provider Global: -- Swollen: --; Tender: -- Joint Exam 07/15/2020   No joint exam has been documented for this visit   There is currently no information documented on the homunculus. Go to the  Rheumatology activity and complete the homunculus joint exam.  Investigation: No additional findings.  Imaging: No results found.  Recent Labs: Lab Results  Component Value Date   WBC 9.9 06/06/2020   HGB 11.7 (L) 06/06/2020   PLT 296.0 06/06/2020   NA 137 06/06/2020   K 4.2 06/06/2020   CL 102 06/06/2020   CO2 27 06/06/2020   GLUCOSE 93 06/06/2020   BUN 20 06/06/2020   CREATININE 0.93 06/06/2020   BILITOT 0.5 06/06/2020   ALKPHOS 49 06/06/2020   AST 14 06/06/2020   ALT 10 06/06/2020   PROT 6.7 06/06/2020   ALBUMIN 4.0 06/06/2020   CALCIUM 9.7 06/06/2020   GFRAA 61 11/14/2018    Speciality Comments: No specialty comments available.  Procedures:  No procedures performed Allergies: Diamox [acetazolamide], Epinephrine-lidocaine-na metabisulfite [lidocaine-epinephrine], Erythromycin, Penicillins, Sulfonamide derivatives, and Tape   Assessment / Plan:     Visit Diagnoses: Sjogren's syndrome with keratoconjunctivitis sicca ([place]) - -RF,-SPEP 11/17. She continues to have some sicca symptoms. Over-the-counter products were discussed. Her symptoms are manageable.  Raynaud's disease without gangrene-currently Raynaud's is not very active.  Lateral epicondylitis, left elbow -she states she has off-and-on epicondylitis in her bilateral elbows.  Chronic midline low back pain without sciatica-she has been having increased pain in her lower back as she has to assist her husband. Have given her handout on back exercises. Core strengthening was also discussed.  Primary osteoarthritis of both hands-she continues to have some discomfort in her hands due to osteoarthritis. Joint protection was discussed.  Primary osteoarthritis of both feet-use of proper fitting shoes was discussed.  Osteopenia of multiple sites - DEXA 07/26/18 L femoral neck T-score -1.8. She needs repeat bone density. Have advised her to get bone density with her GYN.  Other medical problems are listed as  follows:  Other fatigue  History of anemia  History of IBS  Balance disorder  History of gastroesophageal reflux (GERD)  History of atrial fibrillation  History of hypertension   She is fully vaccinated against COVID-19.  Orders: No orders of the defined types were placed in this encounter.  No orders of the defined types were placed in this encounter.   Follow-Up Instructions: Return in about 6 months (around 01/15/2021) for Sjogren's, Osteoarthritis.   Bo Merino, MD  Note - This record has been created using Editor, commissioning.  Chart creation errors have been sought, but may  not always  have been located. Such creation errors do not reflect on  the standard of medical care.

## 2020-07-15 ENCOUNTER — Other Ambulatory Visit: Payer: Self-pay

## 2020-07-15 ENCOUNTER — Ambulatory Visit (INDEPENDENT_AMBULATORY_CARE_PROVIDER_SITE_OTHER): Payer: Medicare Other | Admitting: Rheumatology

## 2020-07-15 ENCOUNTER — Encounter: Payer: Self-pay | Admitting: Rheumatology

## 2020-07-15 VITALS — BP 143/74 | HR 73 | Resp 14 | Ht 62.0 in | Wt 109.4 lb

## 2020-07-15 DIAGNOSIS — M19041 Primary osteoarthritis, right hand: Secondary | ICD-10-CM | POA: Diagnosis not present

## 2020-07-15 DIAGNOSIS — M19071 Primary osteoarthritis, right ankle and foot: Secondary | ICD-10-CM | POA: Diagnosis not present

## 2020-07-15 DIAGNOSIS — Z862 Personal history of diseases of the blood and blood-forming organs and certain disorders involving the immune mechanism: Secondary | ICD-10-CM | POA: Diagnosis not present

## 2020-07-15 DIAGNOSIS — R5383 Other fatigue: Secondary | ICD-10-CM | POA: Diagnosis not present

## 2020-07-15 DIAGNOSIS — I7300 Raynaud's syndrome without gangrene: Secondary | ICD-10-CM | POA: Diagnosis not present

## 2020-07-15 DIAGNOSIS — M19072 Primary osteoarthritis, left ankle and foot: Secondary | ICD-10-CM

## 2020-07-15 DIAGNOSIS — G8929 Other chronic pain: Secondary | ICD-10-CM

## 2020-07-15 DIAGNOSIS — M545 Low back pain: Secondary | ICD-10-CM | POA: Diagnosis not present

## 2020-07-15 DIAGNOSIS — Z8679 Personal history of other diseases of the circulatory system: Secondary | ICD-10-CM | POA: Diagnosis not present

## 2020-07-15 DIAGNOSIS — M8589 Other specified disorders of bone density and structure, multiple sites: Secondary | ICD-10-CM | POA: Diagnosis not present

## 2020-07-15 DIAGNOSIS — R2689 Other abnormalities of gait and mobility: Secondary | ICD-10-CM | POA: Diagnosis not present

## 2020-07-15 DIAGNOSIS — M7712 Lateral epicondylitis, left elbow: Secondary | ICD-10-CM | POA: Diagnosis not present

## 2020-07-15 DIAGNOSIS — M3501 Sicca syndrome with keratoconjunctivitis: Secondary | ICD-10-CM | POA: Diagnosis not present

## 2020-07-15 DIAGNOSIS — Z8719 Personal history of other diseases of the digestive system: Secondary | ICD-10-CM

## 2020-07-15 DIAGNOSIS — M19042 Primary osteoarthritis, left hand: Secondary | ICD-10-CM

## 2020-07-15 NOTE — Patient Instructions (Signed)

## 2020-08-05 ENCOUNTER — Encounter: Payer: Self-pay | Admitting: Obstetrics and Gynecology

## 2020-08-05 DIAGNOSIS — M85852 Other specified disorders of bone density and structure, left thigh: Secondary | ICD-10-CM | POA: Diagnosis not present

## 2020-08-05 DIAGNOSIS — Z1231 Encounter for screening mammogram for malignant neoplasm of breast: Secondary | ICD-10-CM | POA: Diagnosis not present

## 2020-08-05 DIAGNOSIS — M85851 Other specified disorders of bone density and structure, right thigh: Secondary | ICD-10-CM | POA: Diagnosis not present

## 2020-08-05 LAB — HM DEXA SCAN

## 2020-08-06 ENCOUNTER — Encounter: Payer: Self-pay | Admitting: Family Medicine

## 2020-08-06 ENCOUNTER — Telehealth: Payer: Self-pay

## 2020-08-06 NOTE — Telephone Encounter (Signed)
Tell her still has osteopenia- on this machine dont have prior comparison to see how much change. Id continue calcium and vitamin D as well as weight bearing exercise and repeat in 2-3 years

## 2020-08-06 NOTE — Telephone Encounter (Signed)
Results abstracted from Dexa. Provider requested phone not be sent with following information  Left Femur neck  08/05/2020 T Score -1.60  Results placed in your review folder.

## 2020-08-07 NOTE — Telephone Encounter (Signed)
Called and reviewed below with pt.

## 2020-08-20 ENCOUNTER — Other Ambulatory Visit: Payer: Self-pay | Admitting: *Deleted

## 2020-08-20 MED ORDER — PREMPRO 0.45-1.5 MG PO TABS: 1.0000 | ORAL_TABLET | Freq: Every day | ORAL | 0 refills | Status: DC

## 2020-08-20 NOTE — Telephone Encounter (Signed)
Annual exam scheduled on 08/27/20

## 2020-08-26 DIAGNOSIS — Z23 Encounter for immunization: Secondary | ICD-10-CM | POA: Diagnosis not present

## 2020-08-27 ENCOUNTER — Encounter: Payer: Medicare Other | Admitting: Obstetrics and Gynecology

## 2020-09-11 ENCOUNTER — Encounter: Payer: Self-pay | Admitting: Obstetrics and Gynecology

## 2020-09-11 ENCOUNTER — Other Ambulatory Visit: Payer: Self-pay

## 2020-09-11 ENCOUNTER — Ambulatory Visit (INDEPENDENT_AMBULATORY_CARE_PROVIDER_SITE_OTHER): Payer: Medicare Other | Admitting: Obstetrics and Gynecology

## 2020-09-11 VITALS — BP 130/70 | Ht 61.0 in | Wt 109.0 lb

## 2020-09-11 DIAGNOSIS — M8580 Other specified disorders of bone density and structure, unspecified site: Secondary | ICD-10-CM | POA: Diagnosis not present

## 2020-09-11 DIAGNOSIS — Z79890 Hormone replacement therapy (postmenopausal): Secondary | ICD-10-CM

## 2020-09-11 DIAGNOSIS — Z01419 Encounter for gynecological examination (general) (routine) without abnormal findings: Secondary | ICD-10-CM

## 2020-09-11 MED ORDER — PREMPRO 0.45-1.5 MG PO TABS
1.0000 | ORAL_TABLET | Freq: Every day | ORAL | 4 refills | Status: DC
Start: 2020-09-11 — End: 2021-12-02

## 2020-09-11 NOTE — Progress Notes (Signed)
   Bailey [name] 25-Nov-1933 [id number]  SUBJECTIVE:  86 y.o. [id number] female here for a breast and pelvic exam.  Just transitioned to senior living facility due to husband with Parkinson's Disease, but maintaining in independent living. She has no gynecologic concerns.   Current Outpatient Medications  Medication Sig Dispense Refill  . aspirin 81 MG tablet Take 81 mg by mouth daily.    . carvedilol (COREG) 3.125 MG tablet Take 2 tablets by mouth in the am and 1 tablet by mouth in the evening. 270 tablet 3  . Cholecalciferol (VITAMIN D) 400 UNITS capsule Take 2 capsules by mouth daily     . cycloSPORINE (RESTASIS) 0.05 % ophthalmic emulsion Place 1 drop into both eyes 2 (two) times daily.     Marland Kitchen estrogen, conjugated,-medroxyprogesterone (PREMPRO) 0.45-1.5 MG tablet Take 1 tablet by mouth daily. 84 tablet 0  . fish oil-omega-3 fatty acids 1000 MG capsule Take one capsule by mouth  daily    . losartan (COZAAR) 50 MG tablet TAKE 1 TABLET BY MOUTH EVERY DAY 90 tablet 3   No current facility-administered medications for this visit.   Allergies: Diamox [acetazolamide], Epinephrine-lidocaine-na metabisulfite [lidocaine-epinephrine], Erythromycin, Penicillins, Sulfonamide derivatives, and Tape  No LMP recorded (lmp unknown). Patient is postmenopausal.  Past medical history,surgical history, problem list, medications, allergies, family history and social history were all reviewed and documented as reviewed in the EPIC chart.  GYN ROS: no abnormal bleeding, pelvic pain or discharge, no breast pain or new or enlarging lumps on self exam.  No dysuria, urinary frequency, pain with urination, cloudy/malodorous urine.   OBJECTIVE:  BP 130/70   Ht 5\' 1"  (1.549 m)   Wt 109 lb (49.4 kg)   LMP  (LMP Unknown)   BMI 20.60 kg/m  The patient appears well, alert, oriented, in no distress.  BREAST EXAM: breasts appear normal, no suspicious masses, no skin or nipple changes or axillary nodes  PELVIC EXAM:  VULVA: normal appearing vulva with atrophic change, no masses, tenderness or lesions, VAGINA: normal appearing vagina with atrophic change, normal color and discharge, no lesions, CERVIX: normal appearing atrophic cervix without discharge or lesions, UTERUS: uterus is normal size, shape, consistency and nontender, ADNEXA: normal adnexa in size, nontender and no masses  Chaperone: [name] present during the examination  ASSESSMENT:  86 y.o. [id number] here for a breast and pelvic exam  PLAN:   1. Postmenopausal/HRT.  Continues on Prempro 0.45/1.5.  She is aware of the risks of HRT use including thrombotic diseases and the breast cancer issue.  She would like to continue recognizing the positive benefits for her bone health.  Refill x1 year provided. 2. Pap smear 2012.  Screening guidelines have been reviewed and she is comfortable not continuing screening based on age criteria. 3. Mammogram 07/2020.  Normal breast exam today.  Continue with annual mammograms. 4. Colonoscopy 2013.  She will follow up at the interval recommended by her GI specialist.   5. Osteopenia.  DEXA 2021 pain through Dr. Ansel Bong office.  T score -1.6 FRAX 12% / 3.5%, which was stable from prior DEXA.  DEXA follow-up in 2 years per standard recommendation. 6. Health maintenance.  No labs today as she normally has these completed with her primary care doctor.  Her weight has been down about 5 pounds and she has noted some bowel changes.  I do recommend that she discuss this further with her regular doctor.  Return annually or sooner, prn.  Joseph Pierini MD 09/11/20

## 2020-10-16 ENCOUNTER — Ambulatory Visit (INDEPENDENT_AMBULATORY_CARE_PROVIDER_SITE_OTHER): Payer: Medicare Other

## 2020-10-16 DIAGNOSIS — Z0000 Encounter for general adult medical examination without abnormal findings: Secondary | ICD-10-CM | POA: Diagnosis not present

## 2020-10-16 NOTE — Patient Instructions (Addendum)
[name] , Thank you for taking time to come for your Medicare Wellness Visit. I appreciate your ongoing commitment to your health goals. Please review the following plan we discussed and let me know if I can assist you in the future.   Screening recommendations/referrals: Colonoscopy: No longer required Mammogram: Done 08/05/20 Bone Density: Done 08/05/20 Recommended yearly ophthalmology/optometry visit for glaucoma screening and checkup Recommended yearly dental visit for hygiene and checkup  Vaccinations: Influenza vaccine: Done 08/26/20 Up to date Pneumococcal vaccine: Up to date Tdap vaccine: Due and discussed Shingles vaccine: Shingrix discussed. Please contact your pharmacy for coverage information.    Covid-19:Completed 1/21, 2/18 & 10/02/20  Advanced directives: Please bring a copy of your health care power of attorney and living will to the office at your convenience.  Conditions/risks identified: Work on increasing appetite  Next appointment: Follow up in one year for your annual wellness visit    Preventive Care 84 Years and Older, Female Preventive care refers to lifestyle choices and visits with your health care provider that can promote health and wellness. What does preventive care include?  A yearly physical exam. This is also called an annual well check.  Dental exams once or twice a year.  Routine eye exams. Ask your health care provider how often you should have your eyes checked.  Personal lifestyle choices, including:  Daily care of your teeth and gums.  Regular physical activity.  Eating a healthy diet.  Avoiding tobacco and drug use.  Limiting alcohol use.  Practicing safe sex.  Taking low-dose aspirin every day.  Taking vitamin and mineral supplements as recommended by your health care provider. What happens during an annual well check? The services and screenings done by your health care provider during your annual well check will depend on your  age, overall health, lifestyle risk factors, and family history of disease. Counseling  Your health care provider may ask you questions about your:  Alcohol use.  Tobacco use.  Drug use.  Emotional well-being.  Home and relationship well-being.  Sexual activity.  Eating habits.  History of falls.  Memory and ability to understand (cognition).  Work and work Statistician.  Reproductive health. Screening  You may have the following tests or measurements:  Height, weight, and BMI.  Blood pressure.  Lipid and cholesterol levels. These may be checked every 5 years, or more frequently if you are over 16 years old.  Skin check.  Lung cancer screening. You may have this screening every year starting at age 84 if you have a 30-pack-year history of smoking and currently smoke or have quit within the past 15 years.  Fecal occult blood test (FOBT) of the stool. You may have this test every year starting at age 84.  Flexible sigmoidoscopy or colonoscopy. You may have a sigmoidoscopy every 5 years or a colonoscopy every 10 years starting at age 84.  Hepatitis C blood test.  Hepatitis B blood test.  Sexually transmitted disease (STD) testing.  Diabetes screening. This is done by checking your blood sugar (glucose) after you have not eaten for a while (fasting). You may have this done every 1-3 years.  Bone density scan. This is done to screen for osteoporosis. You may have this done starting at age 84.  Mammogram. This may be done every 1-2 years. Talk to your health care provider about how often you should have regular mammograms. Talk with your health care provider about your test results, treatment options, and if necessary, the need  for more tests. Vaccines  Your health care provider may recommend certain vaccines, such as:  Influenza vaccine. This is recommended every year.  Tetanus, diphtheria, and acellular pertussis (Tdap, Td) vaccine. You may need a Td booster  every 10 years.  Zoster vaccine. You may need this after age 84.  Pneumococcal 13-valent conjugate (PCV13) vaccine. One dose is recommended after age 84.  Pneumococcal polysaccharide (PPSV23) vaccine. One dose is recommended after age 84. Talk to your health care provider about which screenings and vaccines you need and how often you need them. This information is not intended to replace advice given to you by your health care provider. Make sure you discuss any questions you have with your health care provider. Document Released: 12/12/2015 Document Revised: 08/04/2016 Document Reviewed: 09/16/2015 Elsevier Interactive Patient Education  2017 [place] Prevention in the Home Falls can cause injuries. They can happen to people of all ages. There are many things you can do to make your home safe and to help prevent falls. What can I do on the outside of my home?  Regularly fix the edges of walkways and driveways and fix any cracks.  Remove anything that might make you trip as you walk through a door, such as a raised step or threshold.  Trim any bushes or trees on the path to your home.  Use bright outdoor lighting.  Clear any walking paths of anything that might make someone trip, such as rocks or tools.  Regularly check to see if handrails are loose or broken. Make sure that both sides of any steps have handrails.  Any raised decks and porches should have guardrails on the edges.  Have any leaves, snow, or ice cleared regularly.  Use sand or salt on walking paths during winter.  Clean up any spills in your garage right away. This includes oil or grease spills. What can I do in the bathroom?  Use night lights.  Install grab bars by the toilet and in the tub and shower. Do not use towel bars as grab bars.  Use non-skid mats or decals in the tub or shower.  If you need to sit down in the shower, use a plastic, non-slip stool.  Keep the floor dry. Clean up any  water that spills on the floor as soon as it happens.  Remove soap buildup in the tub or shower regularly.  Attach bath mats securely with double-sided non-slip rug tape.  Do not have throw rugs and other things on the floor that can make you trip. What can I do in the bedroom?  Use night lights.  Make sure that you have a light by your bed that is easy to reach.  Do not use any sheets or blankets that are too big for your bed. They should not hang down onto the floor.  Have a firm chair that has side arms. You can use this for support while you get dressed.  Do not have throw rugs and other things on the floor that can make you trip. What can I do in the kitchen?  Clean up any spills right away.  Avoid walking on wet floors.  Keep items that you use a lot in easy-to-reach places.  If you need to reach something above you, use a strong step stool that has a grab bar.  Keep electrical cords out of the way.  Do not use floor polish or wax that makes floors slippery. If you must use wax, use  non-skid floor wax.  Do not have throw rugs and other things on the floor that can make you trip. What can I do with my stairs?  Do not leave any items on the stairs.  Make sure that there are handrails on both sides of the stairs and use them. Fix handrails that are broken or loose. Make sure that handrails are as long as the stairways.  Check any carpeting to make sure that it is firmly attached to the stairs. Fix any carpet that is loose or worn.  Avoid having throw rugs at the top or bottom of the stairs. If you do have throw rugs, attach them to the floor with carpet tape.  Make sure that you have a light switch at the top of the stairs and the bottom of the stairs. If you do not have them, ask someone to add them for you. What else can I do to help prevent falls?  Wear shoes that:  Do not have high heels.  Have rubber bottoms.  Are comfortable and fit you well.  Are closed  at the toe. Do not wear sandals.  If you use a stepladder:  Make sure that it is fully opened. Do not climb a closed stepladder.  Make sure that both sides of the stepladder are locked into place.  Ask someone to hold it for you, if possible.  Clearly mark and make sure that you can see:  Any grab bars or handrails.  First and last steps.  Where the edge of each step is.  Use tools that help you move around (mobility aids) if they are needed. These include:  Canes.  Walkers.  Scooters.  Crutches.  Turn on the lights when you go into a dark area. Replace any light bulbs as soon as they burn out.  Set up your furniture so you have a clear path. Avoid moving your furniture around.  If any of your floors are uneven, fix them.  If there are any pets around you, be aware of where they are.  Review your medicines with your doctor. Some medicines can make you feel dizzy. This can increase your chance of falling. Ask your doctor what other things that you can do to help prevent falls. This information is not intended to replace advice given to you by your health care provider. Make sure you discuss any questions you have with your health care provider. Document Released: 09/11/2009 Document Revised: 04/22/2016 Document Reviewed: 12/20/2014 Elsevier Interactive Patient Education  2017 [employer].

## 2020-10-16 NOTE — Progress Notes (Signed)
Virtual Visit via Telephone Note  I connected with  Bailey [name] on 10/16/20 at  1:45 PM EST by telephone and verified that I am speaking with the correct person using two identifiers.  Medicare Annual Wellness visit completed telephonically due to Covid-19 pandemic.   Persons participating in this call: This Health Coach and this patient.   Location: Patient: Home  Provider: Office   I discussed the limitations, risks, security and privacy concerns of performing an evaluation and management service by telephone and the availability of in person appointments. The patient expressed understanding and agreed to proceed.  Unable to perform video visit due to video visit attempted and failed and/or patient does not have video capability.   Some vital signs may be absent or patient reported.   Willette Brace, LPN    Subjective:   Bailey [name] is a 86 y.o. female who presents for Medicare Annual (Subsequent) preventive examination.  Review of Systems     Cardiac Risk Factors include: advanced age (>39men, >43 women);hypertension     Objective:    There were no vitals filed for this visit. There is no height or weight on file to calculate BMI.  Advanced Directives 10/16/2020 09/14/2019 01/28/2018 12/09/2017 09/17/2016 01/21/2015 01/09/2015  Does Patient Have a Medical Advance Directive? Yes Yes No Yes Yes Yes -  Type of Paramedic of [place];Living will Living will;Healthcare Power of [place]  Does patient want to make changes to medical advance directive? - No - Patient declined - - - Yes - Spiritual care consult ordered -  Copy of [place] in Chart? No - copy requested No - copy requested - - - No - copy requested -    Current Medications (verified) Outpatient Encounter Medications as of 10/16/2020  Medication Sig  . aspirin 81 MG tablet Take 81 mg by mouth daily.  . carvedilol (COREG) 3.125 MG  tablet Take 2 tablets by mouth in the am and 1 tablet by mouth in the evening.  . Cholecalciferol (VITAMIN D) 400 UNITS capsule Take 2 capsules by mouth daily   . cycloSPORINE (RESTASIS) 0.05 % ophthalmic emulsion Place 1 drop into both eyes 2 (two) times daily.   Marland Kitchen estrogen, conjugated,-medroxyprogesterone (PREMPRO) 0.45-1.5 MG tablet Take 1 tablet by mouth daily.  . fish oil-omega-3 fatty acids 1000 MG capsule Take one capsule by mouth  daily  . losartan (COZAAR) 50 MG tablet TAKE 1 TABLET BY MOUTH EVERY DAY   No facility-administered encounter medications on file as of 10/16/2020.    Allergies (verified) Diamox [acetazolamide], Epinephrine-lidocaine-na metabisulfite [lidocaine-epinephrine], Erythromycin, Penicillins, Sulfonamide derivatives, and Tape   History: Past Medical History:  Diagnosis Date  . Allergy, unspecified not elsewhere classified   . Anemia, unspecified   . Benign neoplasm of colon   . Cancer [facility])    basal/sqamous/transitional melanoma  . CKD (chronic kidney disease), stage III ([place]) 12/08/2017  . COLONIC POLYPS 09/04/2008   Was told by Gi does not need anymore.    . Diverticulosis of colon (without mention of hemorrhage)   . Dry eye syndrome   . Dysrhythmia    PVCs  . Esophageal reflux   . Hepatitis 1950s   infectious from food  . History of atrial fibrillation   . Hypertension   . Irritable bowel syndrome   . Mitral valve disorders(424.0)   . Moderate aortic regurgitation 12/08/2017  . Osteopenia 06/2018   T score -1.8  FRAX 10% / 2.5%  . Palpitations   . Premature ventricular contractions   . Rheumatoid arthritis(714.0)   . Sicca syndrome ([place])   . Ulcer 1992  . Vaginal delivery 1959, 1960, 1962, 1964   Past Surgical History:  Procedure Laterality Date  . APPENDECTOMY    . CATARACT EXTRACTION    . CHOLECYSTECTOMY    . colon tumor  1969   Benign rectal tumor excised  . DILATATION & CURETTAGE/HYSTEROSCOPY WITH MYOSURE N/A 01/21/2015   Procedure:  DILATATION & CURETTAGE/HYSTEROSCOPY WITH MYOSURE;  Surgeon: [name], MD;  Location: [facility];  Service: Gynecology;  Laterality: N/A;  . HIATAL HERNIA REPAIR    . REFRACTIVE SURGERY    . ROTATOR CUFF REPAIR  2009   right by Dr. French Ana  . squamous cell removed  2013   Dr. Sherrye Payor --removed from her right cheek  . TUBAL LIGATION     Family History  Problem Relation Age of Onset  . Stroke Mother   . Hypertension Mother   . Ulcerative colitis Father   . Heart attack Father   . Breast cancer Maternal Grandmother        Age 8's  . Uterine cancer Maternal Grandmother   . Hypertension Maternal Grandfather   . Colon cancer Paternal Grandmother    Social History   Socioeconomic History  . Marital status: Married    Spouse name: Doren Custard  . Number of children: 4  . Years of education: Not on file  . Highest education level: Not on file  Occupational History  . Occupation: retired  Tobacco Use  . Smoking status: Former Smoker    Packs/day: 1.00    Years: 9.00    Pack years: 9.00    Types: Cigarettes    Quit date: 11/30/1963    Years since quitting: 56.9  . Smokeless tobacco: Never Used  Vaping Use  . Vaping Use: Never used  Substance and Sexual Activity  . Alcohol use: Yes    Alcohol/week: 7.0 standard drinks    Types: 7 Standard drinks or equivalent per week    Comment: daily   . Drug use: Never  . Sexual activity: Not Currently    Birth control/protection: Post-menopausal    Comment: 1st intercourse 26 yo-1 partner  Other Topics Concern  . Not on file  Social History Narrative   Married 1958. 4 children. 10 grandkids (1 trying to go to medical school). No greatgrandkids.       Retired from nursing (medical and surgical, most recently in cardiology)   Degree in history and english after finished nursing- 2 mo before 72th birthday      Hobbies: education, travel, reading, piano    Social Determinants of Health   Financial Resource Strain: Low Risk   .  Difficulty of Paying Living Expenses: Not hard at all  Food Insecurity: No Food Insecurity  . Worried About Charity fundraiser in the Last Year: Never true  . Ran Out of Food in the Last Year: Never true  Transportation Needs: No Transportation Needs  . Lack of Transportation (Medical): No  . Lack of Transportation (Non-Medical): No  Physical Activity: Insufficiently Active  . Days of Exercise per Week: 3 days  . Minutes of Exercise per Session: 30 min  Stress: Stress Concern Present  . Feeling of Stress : To some extent  Social Connections: Moderately Isolated  . Frequency of Communication with Friends and Family: More than three times a week  . Frequency of  Social Gatherings with Friends and Family: Once a week  . Attends Religious Services: Never  . Active Member of Clubs or Organizations: No  . Attends Archivist Meetings: Never  . Marital Status: Married    Tobacco Counseling Counseling given: Not Answered   Clinical Intake:  Pre-visit preparation completed: Yes  Pain : No/denies pain     BMI - recorded: 20.61 Nutritional Status: BMI of 19-24  Normal Nutritional Risks: Nausea/ vomitting/ diarrhea, Unintentional weight loss (not new but nausea at times,  pt states loss of appetite & 10lbs since moving) Diabetes: No  How often do you need to have someone help you when you read instructions, pamphlets, or other written materials from your doctor or pharmacy?: 1 - Never  Diabetic?No  Interpreter Needed?: No  Information entered by :: Charlott Rakes, LPN   Activities of Daily Living In your present state of health, do you have any difficulty performing the following activities: 10/16/2020  Hearing? N  Vision? N  Difficulty concentrating or making decisions? N  Walking or climbing stairs? N  Dressing or bathing? N  Doing errands, shopping? N  Preparing Food and eating ? N  Using the Toilet? N  In the past six months, have you accidently leaked urine?  N  Do you have problems with loss of bowel control? Y  Comment discharge at times  Managing your Medications? N  Managing your Finances? N  Housekeeping or managing your Housekeeping? N  Some recent data might be hidden    Patient Care Team: Marin Olp, MD as PCP - General (Family Medicine) Josue Hector, MD as PCP - Cardiology (Cardiology) Fontaine, Belinda Block, MD (Inactive) as Consulting Physician (Gynecology) Bo Merino, MD as Consulting Physician (Rheumatology) Rutherford Guys, MD as Consulting Physician (Ophthalmology) Harriett Sine, MD as Consulting Physician (Dermatology)  Indicate any recent Medical Services you may have received from other than [facility] providers in the past year (date may be approximate).     Assessment:   This is a routine wellness examination for Bailey [name].  Hearing/Vision screen  Hearing Screening   125Hz  250Hz  500Hz  1000Hz  2000Hz  3000Hz  4000Hz  6000Hz  8000Hz   Right ear:           Left ear:           Comments: Pt denies any hearing issues   Vision Screening Comments: Pt follows up with Dr Gershon Crane for annual eye exams  Dietary issues and exercise activities discussed: Current Exercise Habits: Structured exercise class, Type of exercise: strength training/weights;stretching;walking;Other - see comments (balance exercise), Time (Minutes): 30, Frequency (Times/Week): 3, Weekly Exercise (Minutes/Week): 90  Goals    . Exercise 150 minutes per week (moderate activity)     Will get back to exercise !  Helps with balance  Will go back to PT    . Patient Stated     Monitor caregiver fatigue Caregiver support groups may be helpful  [employer]  [employer]; [phone] Management consultant; Information regarding Long Term Care List of caregiver groups as well as senior options in the community   Explore caregiver fatigue https://caregiver.com/articles/fighting_caregiver_fatigue/    . Patient Stated     Working  on increasing appetite      Depression Screen PHQ 2/9 Scores 10/16/2020 06/06/2020 09/14/2019 12/07/2018 12/09/2017 10/25/2017 09/17/2016  PHQ - 2 Score 1 1 0 0 0 0 0    Fall Risk Fall Risk  10/16/2020 09/14/2019 12/09/2017 10/25/2017 09/17/2016  Falls in the past year? 1 0 No No  No  Number falls in past yr: 1 - - - -  Injury with Fall? 1 0 - - -  Comment bruised on buttocks - - - -  Risk for fall due to : Impaired vision;Impaired balance/gait - - - -  Follow up Falls prevention discussed Falls evaluation completed;Education provided;Falls prevention discussed - - -    Any stairs in or around the home? No  If so, are there any without handrails? No  Home free of loose throw rugs in walkways, pet beds, electrical cords, etc? Yes  Adequate lighting in your home to reduce risk of falls? Yes   ASSISTIVE DEVICES UTILIZED TO PREVENT FALLS:  Life alert? Yes  Use of a cane, walker or w/c? No  Grab bars in the bathroom? Yes  Shower chair or bench in shower? Yes  Elevated toilet seat or a handicapped toilet? Yes   TIMED UP AND GO:  Was the test performed? No .     Cognitive Function: MMSE - Mini Mental State Exam 12/09/2017 09/17/2016  Not completed: (No Data) (No Data)     6CIT Screen 10/16/2020 09/14/2019  What Year? 0 points 0 points  What month? 0 points 0 points  What time? - 0 points  Count back from 20 0 points 0 points  Months in reverse 0 points 0 points  Repeat phrase 0 points 0 points  Total Score - 0    Immunizations Immunization History  Administered Date(s) Administered  . Fluad Quad(high Dose 65+) 08/21/2019  . H1N1 11/26/2008  . Influenza Split 08/30/2011, 10/31/2012  . Influenza Whole 09/05/2008, 08/27/2009, 08/20/2010  . Influenza, High Dose Seasonal PF 08/31/2016, 09/05/2017, 08/30/2018  . Influenza,inj,Quad PF,6+ Mos 09/13/2013, 10/04/2014, 10/01/2015  . Influenza-Unspecified 08/26/2020  . Moderna SARS-COVID-2 Vaccination 12/20/2019, 01/17/2020,  10/02/2020  . Pneumococcal Conjugate-13 10/21/2014  . Pneumococcal Polysaccharide-23 10/11/2015  . Td 02/17/2010  . Zoster 11/29/2008    TDAP status: Due, Education has been provided regarding the importance of this vaccine. Advised may receive this vaccine at local pharmacy or Health Dept. Aware to provide a copy of the vaccination record if obtained from local pharmacy or Health Dept. Verbalized acceptance and understanding.   Flu Vaccine status: Up to date Done 08/26/20   Pneumococcal vaccine status: Up to date Covid-19 vaccine status: Completed vaccines  Qualifies for Shingles Vaccine? Yes   Zostavax completed Yes    Shingrix Completed?: No.    Education has been provided regarding the importance of this vaccine. Patient has been advised to call insurance company to determine out of pocket expense if they have not yet received this vaccine. Advised may also receive vaccine at local pharmacy or Health Dept. Verbalized acceptance and understanding.  Screening Tests Health Maintenance  Topic Date Due  . TETANUS/TDAP  12/01/2020 (Originally 02/18/2020)  . INFLUENZA VACCINE  Completed  . DEXA SCAN  Completed  . COVID-19 Vaccine  Completed  . PNA vac Low Risk Adult  Completed    Health Maintenance  There are no preventive care reminders to display for this patient.  Colorectal cancer screening: No longer required.  Mammogram status: No longer required.  Bone Density status: Completed 08/05/20. Results reflect: Bone density results: OSTEOPENIA. Repeat every 2 years.   Additional Screening:   Vision Screening: Recommended annual ophthalmology exams for early detection of glaucoma and other disorders of the eye. Is the patient up to date with their annual eye exam?  Yes  Who is the provider or what is the name of the  office in which the patient attends annual eye exams? Dr Gershon Crane      Dental Screening: Recommended annual dental exams for proper oral hygiene  Community  Resource Referral / Chronic Care Management: CRR required this visit?  No   CCM required this visit?  No      Plan:     I have personally reviewed and noted the following in the patient's chart:   . Medical and social history . Use of alcohol, tobacco or illicit drugs  . Current medications and supplements . Functional ability and status . Nutritional status . Physical activity . Advanced directives . List of other physicians . Hospitalizations, surgeries, and ER visits in previous 12 months . Vitals . Screenings to include cognitive, depression, and falls . Referrals and appointments  In addition, I have reviewed and discussed with patient certain preventive protocols, quality metrics, and best practice recommendations. A written personalized care plan for preventive services as well as general preventive health recommendations were provided to patient.     Willette Brace, LPN   [id number]   Nurse Notes: None

## 2020-11-18 DIAGNOSIS — L821 Other seborrheic keratosis: Secondary | ICD-10-CM | POA: Diagnosis not present

## 2020-11-18 DIAGNOSIS — L738 Other specified follicular disorders: Secondary | ICD-10-CM | POA: Diagnosis not present

## 2020-11-18 DIAGNOSIS — Z85828 Personal history of other malignant neoplasm of skin: Secondary | ICD-10-CM | POA: Diagnosis not present

## 2020-11-18 DIAGNOSIS — D692 Other nonthrombocytopenic purpura: Secondary | ICD-10-CM | POA: Diagnosis not present

## 2020-11-18 DIAGNOSIS — D225 Melanocytic nevi of trunk: Secondary | ICD-10-CM | POA: Diagnosis not present

## 2020-11-18 DIAGNOSIS — L814 Other melanin hyperpigmentation: Secondary | ICD-10-CM | POA: Diagnosis not present

## 2020-11-18 DIAGNOSIS — D485 Neoplasm of uncertain behavior of skin: Secondary | ICD-10-CM | POA: Diagnosis not present

## 2020-11-18 DIAGNOSIS — L570 Actinic keratosis: Secondary | ICD-10-CM | POA: Diagnosis not present

## 2020-11-18 DIAGNOSIS — D1801 Hemangioma of skin and subcutaneous tissue: Secondary | ICD-10-CM | POA: Diagnosis not present

## 2020-11-26 ENCOUNTER — Other Ambulatory Visit: Payer: Self-pay | Admitting: Cardiovascular Disease

## 2020-11-29 HISTORY — PX: SKIN BIOPSY: SHX1

## 2020-12-07 NOTE — Patient Instructions (Addendum)
Health Maintenance Due  Topic Date Due  . TETANUS/TDAP She knows that she needs it done, she hasn't been able to get an appointment can you discuss with her about getting it done today. 02/18/2020   Please stop by lab before you go If you have mychart- we will send your results within 3 business days of [place] receiving them.  If you do not have mychart- we will call you about results within 5 business days of [place] receiving them.  *please also note that you will see labs on mychart as soon as they post. I will later go in and write notes on them- will say "notes from Dr. Yong Channel"  Recommended follow up:  6 month follow up

## 2020-12-07 NOTE — Progress Notes (Signed)
Phone [phone] In person visit   Subjective:   Bailey [name] is a 87 y.o. year old very pleasant female patient who presents for/with See problem oriented charting Chief Complaint  Patient presents with  . Hypertension  . Biopsy    Patient wants you to look at her face and her leg where she had this procedure done    This visit occurred during the SARS-CoV-2 public health emergency.  Safety protocols were in place, including screening questions prior to the visit, additional usage of staff PPE, and extensive cleaning of exam room while observing appropriate contact time as indicated for disinfecting solutions.   Past Medical History-  Patient Active Problem List   Diagnosis Date Noted  . Sjogren's syndrome with keratoconjunctivitis sicca ([place]) 05/12/2017    Priority: High  . Paroxysmal atrial fibrillation ([place]) 09/05/2008    Priority: High  . CKD (chronic kidney disease), stage III ([place]) 12/08/2017    Priority: Medium  . Mild aortic regurgitation 12/08/2017    Priority: Medium  . HTN (hypertension) 03/13/2015    Priority: Medium  . Raynauds syndrome 10/21/2014    Priority: Medium  . Primary osteoarthritis of both hands 08/21/2009    Priority: Medium  . PVC (premature ventricular contraction) 06/04/2009    Priority: Medium  . Mitral valve prolapse 12/25/2008    Priority: Medium  . Irritable bowel syndrome 09/04/2008    Priority: Medium  . Premature ventricular contractions     Priority: Low  . Hepatitis     Priority: Low  . History of skin cancer     Priority: Low  . Primary osteoarthritis of both feet 10/26/2016    Priority: Low  . Anemia 03/13/2015    Priority: Low  . 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 Edema 06/29/2011    Priority: Low  . Osteopenia 09/05/2008    Priority: Low  . GERD 09/04/2008    Priority: Low  . ALLERGY 09/04/2008    Priority: Low  . COLONIC POLYPS 09/04/2008    Priority: Low  . Dry eyes, bilateral 05/15/2018  .  Lumbar radiculopathy 12/12/2017  . Allergy   . Balance disorder 10/26/2016    Medications- reviewed and updated Current Outpatient Medications  Medication Sig Dispense Refill  . aspirin 81 MG tablet Take 81 mg by mouth daily.    . carvedilol (COREG) 3.125 MG tablet Take 2 tablets by mouth in the am and 1 tablet by mouth in the evening. 270 tablet 3  . Cholecalciferol (VITAMIN D) 400 UNITS capsule Take 2 capsules by mouth daily    . cycloSPORINE (RESTASIS) 0.05 % ophthalmic emulsion Place 1 drop into both eyes 2 (two) times daily.    Marland Kitchen estrogen, conjugated,-medroxyprogesterone (PREMPRO) 0.45-1.5 MG tablet Take 1 tablet by mouth daily. 84 tablet 4  . fish oil-omega-3 fatty acids 1000 MG capsule Take one capsule by mouth  daily    . losartan (COZAAR) 50 MG tablet Take 1 tablet (50 mg total) by mouth daily. Please keep upcoming appt for future refills. Thank you! 90 tablet 0   No current facility-administered medications for this visit.     Objective:  BP 122/62   Pulse 64   Temp (!) 96 F (35.6 C) (Temporal)   Ht 5\' 1"  (1.549 m)   Wt 108 lb (49 kg)   LMP  (LMP Unknown)   SpO2 96%   BMI 20.41 kg/m  Gen: NAD, resting comfortably CV: RRR no murmurs rubs or gallops Lungs:  CTAB no crackles, wheeze, rhonchi Abdomen: soft/nontender/nondistended/normal bowel sounds.  Ext: no edema Skin: warm, dry     Assessment and Plan   #caregiver burden- vascular parkinsons in husbands has been hard on her  #Patient recently had biopsy done with dermatology-2 sites about 2 weeks ago healing well- one on face and one on shin- slow healing but going in right direction   #hypertension/CKD stage III S: compliant with losartan 50 mg, carvedilol 6.5 mg in the morning and 3.25 mg in the evening  Creatinine has been stable around 0.9-1 but she is rather thin. Avoid nsaids. Tylenol is perfect Lab Results  Component Value Date   CREATININE 0.93 06/06/2020  A/P: Blood pressures well  controlled-continue current medication.  Very slight decrease in filtration rate but creatinine has been stable over the last 3 years-I do not suspect she has significant disease/issues with her renal function- GFR is decreasing more due to calculation/age than progressive decline   #Paroxysmal atrial fibrillation/palpitations-follows with Dr. Johnsie Cancel S: Patient previously with episodes of atrial fibrillation related to epinephrine use during dental procedures.  Has not had a known episode since 2014.  She has been continued on aspirin alone since she has been in sinus rhythm for some time.  She is on carvedilol for blood pressure which would offer some rate control if needed but is primarily used for blood pressure -Thankfully patient has remained out of atrial fibrillation.  She denies recent palpitation.  It is reasonable to continue aspirin alone unless obvious recurrence.  She has carvedilol on board for blood pressure- but this would also be beneficial for rate control if needed.  Overall seems stable  #Moderate aortic regurgitation- mild in 2021 S: follows with echocardiograms with cardiology.  Wears compression hose for edema. A/P: overall stable with last echo 12/26/19 actually showing only mild Aortic regurgitation! Plan for later this month for echocardiogram  #feels balance is off- tries to do PT most mornings at [facility]  % Sjogren's S: Follows closely with Dr. Estanislado Pandy.  On Restasis for dry eyes related to this A/P: stable- continue rheumatology follow up    Recommended follow up: Return in about 6 months (around 06/07/2021) for follow up- or sooner if needed. Future Appointments  Date Time Provider [place]  12/26/2020  1:45 PM [facility] ECHO 3 [id number]  12/30/2020 10:00 AM Josue Hector, MD [facility]  01/13/2021 10:30 AM Bo Merino, MD [facility] None  09/21/2021 11:00 AM Joseph Pierini, MD [facility] None  11/05/2021  1:45 PM [facility]  HEALTH COACH [facility]    Lab/Order associations:   ICD-10-CM   1. Primary hypertension  I10 CBC with Differential/Platelet    Comprehensive metabolic panel    Lipid panel  2. Anemia, unspecified type  D64.9   3. Sjogren's syndrome with keratoconjunctivitis sicca (HCC)  M35.01   4. Paroxysmal atrial fibrillation (HCC)  I48.0   5. Mild aortic regurgitation  I35.1   6. Stage 3 chronic kidney disease, unspecified whether stage 3a or 3b CKD ([place])  N18.30    Return precautions advised.  Garret Reddish, MD

## 2020-12-08 ENCOUNTER — Ambulatory Visit (INDEPENDENT_AMBULATORY_CARE_PROVIDER_SITE_OTHER): Payer: Medicare Other | Admitting: Family Medicine

## 2020-12-08 ENCOUNTER — Encounter: Payer: Self-pay | Admitting: Family Medicine

## 2020-12-08 ENCOUNTER — Other Ambulatory Visit: Payer: Self-pay

## 2020-12-08 VITALS — BP 122/62 | HR 64 | Temp 96.0°F | Ht 61.0 in | Wt 108.0 lb

## 2020-12-08 DIAGNOSIS — N183 Chronic kidney disease, stage 3 unspecified: Secondary | ICD-10-CM

## 2020-12-08 DIAGNOSIS — D649 Anemia, unspecified: Secondary | ICD-10-CM | POA: Diagnosis not present

## 2020-12-08 DIAGNOSIS — I351 Nonrheumatic aortic (valve) insufficiency: Secondary | ICD-10-CM | POA: Diagnosis not present

## 2020-12-08 DIAGNOSIS — I480 Paroxysmal atrial fibrillation: Secondary | ICD-10-CM | POA: Diagnosis not present

## 2020-12-08 DIAGNOSIS — M3501 Sicca syndrome with keratoconjunctivitis: Secondary | ICD-10-CM | POA: Diagnosis not present

## 2020-12-08 DIAGNOSIS — I10 Essential (primary) hypertension: Secondary | ICD-10-CM | POA: Diagnosis not present

## 2020-12-08 LAB — CBC WITH DIFFERENTIAL/PLATELET
Basophils Absolute: 0.1 10*3/uL (ref 0.0–0.1)
Basophils Relative: 0.6 % (ref 0.0–3.0)
Eosinophils Absolute: 0.1 10*3/uL (ref 0.0–0.7)
Eosinophils Relative: 1.2 % (ref 0.0–5.0)
HCT: 37.5 % (ref 36.0–46.0)
Hemoglobin: 12 g/dL (ref 12.0–15.0)
Lymphocytes Relative: 19.8 % (ref 12.0–46.0)
Lymphs Abs: 2.2 10*3/uL (ref 0.7–4.0)
MCHC: 32.1 g/dL (ref 30.0–36.0)
MCV: 92.4 fl (ref 78.0–100.0)
Monocytes Absolute: 1.1 10*3/uL — ABNORMAL HIGH (ref 0.1–1.0)
Monocytes Relative: 10.1 % (ref 3.0–12.0)
Neutro Abs: 7.6 10*3/uL (ref 1.4–7.7)
Neutrophils Relative %: 68.3 % (ref 43.0–77.0)
Platelets: 307 10*3/uL (ref 150.0–400.0)
RBC: 4.06 Mil/uL (ref 3.87–5.11)
RDW: 13.2 % (ref 11.5–15.5)
WBC: 11.1 10*3/uL — ABNORMAL HIGH (ref 4.0–10.5)

## 2020-12-08 LAB — COMPREHENSIVE METABOLIC PANEL
ALT: 11 U/L (ref 0–35)
AST: 14 U/L (ref 0–37)
Albumin: 4.2 g/dL (ref 3.5–5.2)
Alkaline Phosphatase: 46 U/L (ref 39–117)
BUN: 21 mg/dL (ref 6–23)
CO2: 29 mEq/L (ref 19–32)
Calcium: 9.7 mg/dL (ref 8.4–10.5)
Chloride: 102 mEq/L (ref 96–112)
Creatinine, Ser: 0.97 mg/dL (ref 0.40–1.20)
GFR: 53.35 mL/min — ABNORMAL LOW (ref >60.00)
Glucose, Bld: 107 mg/dL — ABNORMAL HIGH (ref 70–99)
Potassium: 3.9 mEq/L (ref 3.5–5.1)
Sodium: 137 mEq/L (ref 135–145)
Total Bilirubin: 0.5 mg/dL (ref 0.2–1.2)
Total Protein: 7.6 g/dL (ref 6.0–8.3)

## 2020-12-08 LAB — LIPID PANEL
Cholesterol: 179 mg/dL (ref 0–200)
HDL: 90.8 mg/dL (ref >39.00)
LDL Cholesterol: 70 mg/dL (ref 0–99)
NonHDL: 88.5
Total CHOL/HDL Ratio: 2
Triglycerides: 92 mg/dL (ref 0.0–149.0)
VLDL: 18.4 mg/dL (ref 0.0–40.0)

## 2020-12-11 ENCOUNTER — Other Ambulatory Visit: Payer: Self-pay | Admitting: Cardiovascular Disease

## 2020-12-24 NOTE — Progress Notes (Signed)
Patient ID: Bailey [name], female   DOB: 07-25-1933, 87 y.o.   MRN: [id number]     87 y.o. history of PVC;s, HTN and moderate AR, Raynaud's and Sjorgrens Sees dermatology for chronic lichen sclerosis    Home BP readings good and cuff accurate  Compliant with meds Some fatigue  Discussed f/u echo for degree of AR and LV size and function   Now living at [facility] as she had been in home for 46 years   Has had COVID vaccine  TTE done 12/26/19 showed only mild AR and normal LV size and function   BP up but ok at [facility]   She is struggling with her confinement due to her husbands Parkinsons   ROS: Denies fever, malais, weight loss, blurry vision, decreased visual acuity, cough, sputum, SOB, hemoptysis, pleuritic pain, palpitaitons, heartburn, abdominal pain, melena, lower extremity edema, claudication, or rash.  All other systems reviewed and negative  General: Vitals:   12/30/20 0958  BP: (!) 160/62  Pulse: 64  SpO2: 97%    Affect appropriate Elderly female  HEENT: normal Neck supple with no adenopathy JVP normal no bruits no thyromegaly Lungs clear with no wheezing and good diaphragmatic motion Heart:  S1/S2 no murmur, no rub, gallop or click PMI normal Abdomen: benighn, BS positve, no tenderness, no AAA no bruit.  No HSM or HJR Distal pulses intact with no bruits No edema Neuro non-focal No muscular weakness Healing ulcer right ankle    Current Outpatient Medications  Medication Sig Dispense Refill  . aspirin 81 MG tablet Take 81 mg by mouth daily.    . carvedilol (COREG) 3.125 MG tablet TAKE 2 TABLETS BY MOUTH IN THE AM AND 1 TABLET BY MOUTH IN THE EVENING. 270 tablet 3  . Cholecalciferol (VITAMIN D) 400 UNITS capsule Take 2 capsules by mouth daily    . cycloSPORINE (RESTASIS) 0.05 % ophthalmic emulsion Place 1 drop into both eyes 2 (two) times daily.    Marland Kitchen estrogen, conjugated,-medroxyprogesterone (PREMPRO) 0.45-1.5 MG tablet Take 1 tablet by mouth daily. 84  tablet 4  . fish oil-omega-3 fatty acids 1000 MG capsule Take one capsule by mouth  daily    . losartan (COZAAR) 50 MG tablet Take 1 tablet (50 mg total) by mouth daily. 90 tablet 3   No current facility-administered medications for this visit.    Allergies  Diamox [acetazolamide], Epinephrine-lidocaine-na metabisulfite [lidocaine-epinephrine], Erythromycin, Penicillins, Sulfonamide derivatives, and Tape  Electrocardiogram:  12/30/2020 nonspecific ST changes   Assessment and Plan Palpitations:  PAC;s /PVC;s  Stable on beta blocker  AR:   Mild  by echo 12/26/19   LV compensated on ARB f/u echo in a year  HTN:  ARB and beta blocker increased March 2019  Edema:  Venous wearing compression stockings improved f/u dermatology  Chest pain:  Improved normal myovue 04/05/16  observe  Anxiety:  Husbands health and recent move to [place] f/u primary consider SSRI Husbands health is biggest stress  Derm:  Consider 2nd opinion regarding non healing lichen lesion on right ankle   F/U  In a year    Time spent on reviewing chart, writing note and direct patient interview via video 20 minutes   Jenkins Rouge

## 2020-12-26 ENCOUNTER — Other Ambulatory Visit: Payer: Self-pay

## 2020-12-26 ENCOUNTER — Ambulatory Visit (HOSPITAL_COMMUNITY): Payer: Medicare Other | Attending: Cardiology

## 2020-12-26 DIAGNOSIS — I351 Nonrheumatic aortic (valve) insufficiency: Secondary | ICD-10-CM | POA: Diagnosis not present

## 2020-12-26 LAB — ECHOCARDIOGRAM COMPLETE
Area-P 1/2: 3.92 cm2
P 1/2 time: 454 msec
S' Lateral: 1.8 cm

## 2020-12-29 ENCOUNTER — Ambulatory Visit: Payer: Medicare Other | Admitting: Cardiovascular Disease

## 2020-12-30 ENCOUNTER — Ambulatory Visit (INDEPENDENT_AMBULATORY_CARE_PROVIDER_SITE_OTHER): Payer: Medicare Other | Admitting: Cardiovascular Disease

## 2020-12-30 ENCOUNTER — Other Ambulatory Visit: Payer: Self-pay

## 2020-12-30 ENCOUNTER — Encounter: Payer: Self-pay | Admitting: Cardiovascular Disease

## 2020-12-30 VITALS — BP 160/62 | HR 64 | Ht 62.0 in | Wt 108.0 lb

## 2020-12-30 DIAGNOSIS — I351 Nonrheumatic aortic (valve) insufficiency: Secondary | ICD-10-CM | POA: Diagnosis not present

## 2020-12-30 NOTE — Patient Instructions (Signed)
Medication Instructions:  *If you need a refill on your cardiac medications before your next appointment, please call your pharmacy*  Lab Work: If you have labs (blood work) drawn today and your tests are completely normal, you will receive your results only by: Marland Kitchen MyChart Message (if you have MyChart) OR . A paper copy in the mail If you have any lab test that is abnormal or we need to change your treatment, we will call you to review the results.  Testing/Procedures: None order today.   Follow-Up: At [facility], you and your health needs are our priority.  As part of our continuing mission to provide you with exceptional heart care, we have created designated Provider Care Teams.  These Care Teams include your primary Cardiologist (physician) and Advanced Practice Providers (APPs -  Physician Assistants and Nurse Practitioners) who all work together to provide you with the care you need, when you need it.  We recommend signing up for the patient portal called "MyChart".  Sign up information is provided on this After Visit Summary.  MyChart is used to connect with patients for Virtual Visits (Telemedicine).  Patients are able to view lab/test results, encounter notes, upcoming appointments, etc.  Non-urgent messages can be sent to your provider as well.   To learn more about what you can do with MyChart, go to [email or web address].    Your next appointment:   12 month(s)  The format for your next appointment:   In Person  Provider:   You may see Jenkins Rouge, MD or one of the following Advanced Practice Providers on your designated Care Team:    Kathyrn Drown, NP

## 2020-12-30 NOTE — Progress Notes (Signed)
Office Visit Note  Patient: Bailey [name]             Date of Birth: 01/16/1933           MRN: [id number]             PCP: Marin Olp, MD Referring: Marin Olp, MD Visit Date: 01/13/2021 Occupation: @GUAROCC @  Subjective:  Lower back pain.   History of Present Illness: Bailey [name] is a 87 y.o. female with history of Sjogren's, osteoarthritis and osteopenia.  She states she has been under a lot of stress as her husband has been diagnosed with Parkinson's disease and she has to help him.  She has been having increased lower back pain recently she has been helping him getting up from the sitting position.  She denies any radiculopathy.  She continues to have sicca symptoms which are manageable with over-the-counter products.  She is also using Restasis and Biotene.  She continues to have some discomfort in her hands and feet from underlying osteoarthritis which is manageable.  Activities of Daily Living:  Patient reports morning stiffness for 30-60 minutes.   Patient Denies nocturnal pain.  Difficulty dressing/grooming: Denies Difficulty climbing stairs: Denies Difficulty getting out of chair: Denies Difficulty using hands for taps, buttons, cutlery, and/or writing: Denies  Review of Systems  Constitutional: Positive for fatigue.  HENT: Positive for mouth dryness. Negative for mouth sores and nose dryness.   Eyes: Positive for dryness. Negative for pain and itching.  Respiratory: Negative for shortness of breath and difficulty breathing.   Cardiovascular: Negative for chest pain and palpitations.  Gastrointestinal: Negative for blood in stool, constipation and diarrhea.  Endocrine: Negative for increased urination.  Genitourinary: Negative for difficulty urinating.  Musculoskeletal: Positive for joint swelling and morning stiffness. Negative for arthralgias, joint pain, myalgias, muscle tenderness and myalgias.  Skin: Negative for color change, rash and redness.   Allergic/Immunologic: Negative for susceptible to infections.  Neurological: Positive for numbness. Negative for dizziness, headaches, memory loss and weakness.  Hematological: Positive for bruising/bleeding tendency.  Psychiatric/Behavioral: Negative for confusion.    PMFS History:  Patient Active Problem List   Diagnosis Date Noted  . Dry eyes, bilateral 05/15/2018  . Lumbar radiculopathy 12/12/2017  . Allergy   . CKD (chronic kidney disease), stage III ([place]) 12/08/2017  . Mild aortic regurgitation 12/08/2017  . Premature ventricular contractions   . Hepatitis   . History of skin cancer   . Sjogren's syndrome with keratoconjunctivitis sicca ([place]) 05/12/2017  . Balance disorder 10/26/2016  . Primary osteoarthritis of both feet 10/26/2016  . HTN (hypertension) 03/13/2015  . Anemia 03/13/2015  . Raynauds syndrome 10/21/2014  . Recurrent cold sores 10/21/2014  . Edema 06/29/2011  . Primary osteoarthritis of both hands 08/21/2009  . PVC (premature ventricular contraction) 06/04/2009  . Mitral valve prolapse 12/25/2008  . Paroxysmal atrial fibrillation ([place]) 09/05/2008  . Osteopenia 09/05/2008  . GERD 09/04/2008  . Irritable bowel syndrome 09/04/2008  . ALLERGY 09/04/2008  . COLONIC POLYPS 09/04/2008    Past Medical History:  Diagnosis Date  . Allergy, unspecified not elsewhere classified   . Anemia, unspecified   . Benign neoplasm of colon   . Cancer [facility])    basal/sqamous/transitional melanoma  . CKD (chronic kidney disease), stage III ([place]) 12/08/2017  . COLONIC POLYPS 09/04/2008   Was told by Gi does not need anymore.    . Diverticulosis of colon (without mention of hemorrhage)   . Dry eye syndrome   .  Dysrhythmia    PVCs  . Esophageal reflux   . Hepatitis 1950s   infectious from food  . History of atrial fibrillation   . Hypertension   . Irritable bowel syndrome   . Mitral valve disorders(424.0)   . Moderate aortic regurgitation 12/08/2017  . Osteopenia  06/2018   T score -1.8 FRAX 10% / 2.5%  . Palpitations   . Premature ventricular contractions   . Rheumatoid arthritis(714.0)   . Sicca syndrome ([place])   . Ulcer 1992  . Vaginal delivery 1959, 1960, 1962, 1964    Family History  Problem Relation Age of Onset  . Stroke Mother   . Hypertension Mother   . Ulcerative colitis Father   . Heart attack Father   . Breast cancer Maternal Grandmother        Age 60's  . Uterine cancer Maternal Grandmother   . Hypertension Maternal Grandfather   . Colon cancer Paternal Grandmother    Past Surgical History:  Procedure Laterality Date  . APPENDECTOMY    . CATARACT EXTRACTION    . CHOLECYSTECTOMY    . colon tumor  1969   Benign rectal tumor excised  . DILATATION & CURETTAGE/HYSTEROSCOPY WITH MYOSURE N/A 01/21/2015   Procedure: DILATATION & CURETTAGE/HYSTEROSCOPY WITH MYOSURE;  Surgeon: [name], MD;  Location: [facility];  Service: Gynecology;  Laterality: N/A;  . HIATAL HERNIA REPAIR    . REFRACTIVE SURGERY    . ROTATOR CUFF REPAIR  2009   right by Dr. French Ana  . SKIN BIOPSY  11/2020  . squamous cell removed  2013   Dr. Sherrye Payor --removed from her right cheek  . TUBAL LIGATION     Social History   Social History Narrative   Married 1958. 4 children. 10 grandkids (1 trying to go to medical school). No greatgrandkids.       Retired from nursing (medical and surgical, most recently in cardiology)   Degree in history and english after finished nursing- 2 mo before 72th birthday      Hobbies: education, travel, reading, piano    Immunization History  Administered Date(s) Administered  . Fluad Quad(high Dose 65+) 08/21/2019  . H1N1 11/26/2008  . Influenza Split 08/30/2011, 10/31/2012  . Influenza Whole 09/05/2008, 08/27/2009, 08/20/2010  . Influenza, High Dose Seasonal PF 08/31/2016, 09/05/2017, 08/30/2018  . Influenza,inj,Quad PF,6+ Mos 09/13/2013, 10/04/2014, 10/01/2015  . Influenza-Unspecified 08/26/2020  . Moderna  Sars-Covid-2 Vaccination 12/20/2019, 01/17/2020, 10/02/2020  . Pneumococcal Conjugate-13 10/21/2014  . Pneumococcal Polysaccharide-23 10/11/2015  . Td 02/17/2010  . Zoster 11/29/2008     Objective: Vital Signs: BP 130/61 (BP Location: Right Arm, Patient Position: Sitting, Cuff Size: Normal)   Pulse 62   Resp 13   Ht 5\' 2"  (1.575 m)   Wt 109 lb 9.6 oz (49.7 kg)   LMP  (LMP Unknown)   BMI 20.05 kg/m    Physical Exam Vitals and nursing note reviewed.  Constitutional:      Appearance: She is well-developed and well-nourished.  HENT:     Head: Normocephalic and atraumatic.  Eyes:     Extraocular Movements: EOM normal.     Conjunctiva/sclera: Conjunctivae normal.  Cardiovascular:     Rate and Rhythm: Normal rate and regular rhythm.     Pulses: Intact distal pulses.     Heart sounds: Normal heart sounds.  Pulmonary:     Effort: Pulmonary effort is normal.     Breath sounds: Normal breath sounds.  Abdominal:     General: Bowel sounds  are normal.     Palpations: Abdomen is soft.  Musculoskeletal:     Cervical back: Normal range of motion.  Lymphadenopathy:     Cervical: No cervical adenopathy.  Skin:    General: Skin is warm and dry.     Capillary Refill: Capillary refill takes less than 2 seconds.  Neurological:     Mental Status: She is alert and oriented to person, place, and time.  Psychiatric:        Mood and Affect: Mood and affect normal.        Behavior: Behavior normal.      Musculoskeletal Exam: C-spine had limited range of motion.  She had limited range of motion of lumbar spine with discomfort.  No point tenderness was noted.  Shoulder joints, elbow joints, wrist joints with good range of motion.  She had bilateral DIP and PIP thickening.  Hip joints with good range of motion.  Knee joints with good range of motion with no synovitis.  She had no tenderness over ankles or MTPs.  CDAI Exam: CDAI Score: - Patient Global: -; Provider Global: - Swollen: -;  Tender: - Joint Exam 01/13/2021   No joint exam has been documented for this visit   There is currently no information documented on the homunculus. Go to the Rheumatology activity and complete the homunculus joint exam.  Investigation: No additional findings.  Imaging: ECHOCARDIOGRAM COMPLETE  Result Date: 12/26/2020    ECHOCARDIOGRAM REPORT   Patient Name:   CHEYENNA TRAW   Date of Exam: 12/26/2020 Medical Rec #:  [id number]     Height:       61.0 in Accession #:    [id number]    Weight:       108.0 lb Date of Birth:  07/05/1933     BSA:          1.454 m Patient Age:    24 years      BP:           75/120 mmHg Patient Gender: F             HR:           64 bpm. Exam Location:  [address] Procedure: 2D Echo, 3D Echo, Cardiac Doppler, Color Doppler and Strain Analysis Indications:    I35.1 Aortic valve insufficency  History:        Patient has prior history of Echocardiogram examinations, most                 recent 12/26/2019. Previous echo revealed LVEF 65% with GLS                 -20.2, mild AR, PAP 32.6 mmHg.  Sonographer:    Lenard Galloway BA, RDCS Referring Phys: [place]  1. Left ventricular ejection fraction, by estimation, is 60 to 65%. The left ventricle has normal function. The left ventricle has no regional wall motion abnormalities. Left ventricular diastolic parameters are consistent with Grade I diastolic dysfunction (impaired relaxation). The average left ventricular global longitudinal strain is -22.6 %. The global longitudinal strain is normal.  2. Right ventricular systolic function is normal. The right ventricular size is normal. There is mildly elevated pulmonary artery systolic pressure. The estimated right ventricular systolic pressure is [id number] mmHg.  3. The mitral valve is normal in structure. No evidence of mitral valve regurgitation. No evidence of mitral stenosis.  4. Tricuspid valve regurgitation is severe.  5. The aortic valve is  normal in structure.  There is mild calcification of the aortic valve. There is mild thickening of the aortic valve. Aortic valve regurgitation is moderate. No aortic stenosis is present. Aortic regurgitation PHT measures 454 msec.  6. The inferior vena cava is normal in size with greater than 50% respiratory variability, suggesting right atrial pressure of 3 mmHg. Comparison(s): Prior images reviewed side by side. Prior aortic regurgitation mild, now moderate. FINDINGS  Left Ventricle: Left ventricular ejection fraction, by estimation, is 60 to 65%. The left ventricle has normal function. The left ventricle has no regional wall motion abnormalities. The average left ventricular global longitudinal strain is -22.6 %. The global longitudinal strain is normal. The left ventricular internal cavity size was normal in size. There is no left ventricular hypertrophy. Left ventricular diastolic parameters are consistent with Grade I diastolic dysfunction (impaired relaxation). Right Ventricle: The right ventricular size is normal. No increase in right ventricular wall thickness. Right ventricular systolic function is normal. There is mildly elevated pulmonary artery systolic pressure. The tricuspid regurgitant velocity is 2.99  m/s, and with an assumed right atrial pressure of 3 mmHg, the estimated right ventricular systolic pressure is 16.0 mmHg. Left Atrium: Left atrial size was normal in size. Right Atrium: Right atrial size was normal in size. Pericardium: There is no evidence of pericardial effusion. Mitral Valve: The mitral valve is normal in structure. No evidence of mitral valve regurgitation. No evidence of mitral valve stenosis. Tricuspid Valve: The tricuspid valve is normal in structure. Tricuspid valve regurgitation is severe. No evidence of tricuspid stenosis. Aortic Valve: The aortic valve is normal in structure. There is mild calcification of the aortic valve. There is mild thickening of the aortic valve. Aortic valve  regurgitation is moderate. Aortic regurgitation PHT measures 454 msec. No aortic stenosis is present. Pulmonic Valve: The pulmonic valve was normal in structure. Pulmonic valve regurgitation is trivial. No evidence of pulmonic stenosis. Aorta: The aortic root is normal in size and structure. Venous: The inferior vena cava is normal in size with greater than 50% respiratory variability, suggesting right atrial pressure of 3 mmHg. IAS/Shunts: No atrial level shunt detected by color flow Doppler.  LEFT VENTRICLE PLAX 2D LVIDd:         3.50 cm  Diastology LVIDs:         1.80 cm  LV e' medial:    5.87 cm/s LV PW:         0.70 cm  LV E/e' medial:  15.1 LV IVS:        0.70 cm  LV e' lateral:   6.64 cm/s LVOT diam:     1.85 cm  LV E/e' lateral: 13.4 LV SV:         61 LV SV Index:   42       2D Longitudinal Strain LVOT Area:     2.69 cm 2D Strain GLS (A2C):   -23.7 %                         2D Strain GLS (A3C):   -23.0 %                         2D Strain GLS (A4C):   -21.1 %                         2D Strain GLS Avg:     -22.6 %  3D Volume EF:                         3D EF:        70 %                         LV EDV:       65 ml                         LV ESV:       20 ml                         LV SV:        46 ml RIGHT VENTRICLE             IVC RV Basal diam:  3.40 cm     IVC diam: 2.10 cm RV Mid diam:    2.10 cm RV S prime:     10.90 cm/s TAPSE (M-mode): 2.6 cm RVSP:           38.8 mmHg LEFT ATRIUM             Index       RIGHT ATRIUM LA diam:        3.20 cm 2.20 cm/m  RA Pressure: 3.00 mmHg LA Vol (A2C):   38.6 ml 26.55 ml/m LA Vol (A4C):   23.3 ml 16.03 ml/m LA Biplane Vol: 30.9 ml 21.26 ml/m  AORTIC VALVE LVOT Vmax:   98.60 cm/s LVOT Vmean:  70.467 cm/s LVOT VTI:    0.228 m AI PHT:      454 msec  AORTA Ao Root diam: 3.10 cm MITRAL VALVE                TRICUSPID VALVE MV Area (PHT):              TR Peak grad:   35.8 mmHg MV Decel Time: 194 msec     TR Vmax:        299.00 cm/s MV E velocity:  88.87 cm/s   Estimated RAP:  3.00 mmHg MV A velocity: 100.47 cm/s  RVSP:           38.8 mmHg MV E/A ratio:  0.88                             SHUNTS                             Systemic VTI:  0.23 m                             Systemic Diam: 1.85 cm Candee Furbish MD Electronically signed by Candee Furbish MD Signature Date/Time: 12/26/2020/3:20:27 PM    Final     Recent Labs: Lab Results  Component Value Date   WBC 11.1 (H) 12/08/2020   HGB 12.0 12/08/2020   PLT 307.0 12/08/2020   NA 137 12/08/2020   K 3.9 12/08/2020   CL 102 12/08/2020   CO2 29 12/08/2020   GLUCOSE 107 (H) 12/08/2020   BUN 21 12/08/2020   CREATININE 0.97 12/08/2020   BILITOT 0.5 12/08/2020   ALKPHOS 46 12/08/2020   AST 14 12/08/2020   ALT 11  12/08/2020   PROT 7.6 12/08/2020   ALBUMIN 4.2 12/08/2020   CALCIUM 9.7 12/08/2020   GFRAA 61 11/14/2018    Speciality Comments: No specialty comments available.  Procedures:  No procedures performed Allergies: Diamox [acetazolamide], Epinephrine-lidocaine-na metabisulfite [lidocaine-epinephrine], Erythromycin, Penicillins, Sulfonamide derivatives, and Tape   Assessment / Plan:     Visit Diagnoses: Sjogren's syndrome with keratoconjunctivitis sicca ([place]) - -RF,-SPEP 11/17.  She continues to have sicca symptoms.  She has been using Restasis, Biotene and other over-the-counter products.  Raynaud's disease without gangrene-currently not symptomatic.  Lateral epicondylitis, left elbow-she has intermittent discomfort.  Currently she is not very symptomatic.  Chronic midline low back pain without sciatica-she has been experiencing increased lower back pain.  She had no point tenderness.  She has been lifting and helping her husband which is causing increased strain on the back.  She had no radiculopathy.  Have given her a handout on back exercises.  I also offered physical therapy which she declined.  Primary osteoarthritis of both hands-joint protection muscle strengthening was  discussed.  Primary osteoarthritis of both feet-proper fitting shoes were discussed.  Osteopenia of multiple sites - DEXA 08/05/2020 T-score: -1.6, BMD: 0.668 left femoral neck. DEXA 07/26/18 L femoral neck T-score -1.8.  We will continue to monitor her bone density.  Use of calcium, vitamin D and resistive exercises was discussed.  Other fatigue-she has chronic fatigue.  History of IBS  History of anemia  Balance disorder  History of hypertension-her blood pressure is under control.  History of atrial fibrillation  History of gastroesophageal reflux (GERD)  She has been fully vaccinated against COVID-19 and also received a booster.  Use of mask, social distancing and hand hygiene was discussed.  Orders: No orders of the defined types were placed in this encounter.  No orders of the defined types were placed in this encounter.    Follow-Up Instructions: Return in about 6 months (around 07/13/2021) for Sjogren's, Osteoarthritis.   Bo Merino, MD  Note - This record has been created using Editor, commissioning.  Chart creation errors have been sought, but may not always  have been located. Such creation errors do not reflect on  the standard of medical care.

## 2021-01-13 ENCOUNTER — Encounter: Payer: Self-pay | Admitting: Rheumatology

## 2021-01-13 ENCOUNTER — Other Ambulatory Visit: Payer: Self-pay

## 2021-01-13 ENCOUNTER — Ambulatory Visit (INDEPENDENT_AMBULATORY_CARE_PROVIDER_SITE_OTHER): Payer: Medicare Other | Admitting: Rheumatology

## 2021-01-13 VITALS — BP 130/61 | HR 62 | Resp 13 | Ht 62.0 in | Wt 109.6 lb

## 2021-01-13 DIAGNOSIS — R5383 Other fatigue: Secondary | ICD-10-CM | POA: Diagnosis not present

## 2021-01-13 DIAGNOSIS — M19041 Primary osteoarthritis, right hand: Secondary | ICD-10-CM

## 2021-01-13 DIAGNOSIS — M3501 Sicca syndrome with keratoconjunctivitis: Secondary | ICD-10-CM | POA: Diagnosis not present

## 2021-01-13 DIAGNOSIS — G8929 Other chronic pain: Secondary | ICD-10-CM

## 2021-01-13 DIAGNOSIS — M8589 Other specified disorders of bone density and structure, multiple sites: Secondary | ICD-10-CM | POA: Diagnosis not present

## 2021-01-13 DIAGNOSIS — Z8679 Personal history of other diseases of the circulatory system: Secondary | ICD-10-CM

## 2021-01-13 DIAGNOSIS — M19071 Primary osteoarthritis, right ankle and foot: Secondary | ICD-10-CM | POA: Diagnosis not present

## 2021-01-13 DIAGNOSIS — Z862 Personal history of diseases of the blood and blood-forming organs and certain disorders involving the immune mechanism: Secondary | ICD-10-CM

## 2021-01-13 DIAGNOSIS — M545 Low back pain, unspecified: Secondary | ICD-10-CM | POA: Diagnosis not present

## 2021-01-13 DIAGNOSIS — Z8719 Personal history of other diseases of the digestive system: Secondary | ICD-10-CM | POA: Diagnosis not present

## 2021-01-13 DIAGNOSIS — R2689 Other abnormalities of gait and mobility: Secondary | ICD-10-CM

## 2021-01-13 DIAGNOSIS — M7712 Lateral epicondylitis, left elbow: Secondary | ICD-10-CM | POA: Diagnosis not present

## 2021-01-13 DIAGNOSIS — M19042 Primary osteoarthritis, left hand: Secondary | ICD-10-CM

## 2021-01-13 DIAGNOSIS — I7300 Raynaud's syndrome without gangrene: Secondary | ICD-10-CM

## 2021-01-13 DIAGNOSIS — M19072 Primary osteoarthritis, left ankle and foot: Secondary | ICD-10-CM

## 2021-01-13 NOTE — Patient Instructions (Signed)

## 2021-02-09 DIAGNOSIS — Z20828 Contact with and (suspected) exposure to other viral communicable diseases: Secondary | ICD-10-CM | POA: Diagnosis not present

## 2021-02-09 DIAGNOSIS — Z1159 Encounter for screening for other viral diseases: Secondary | ICD-10-CM | POA: Diagnosis not present

## 2021-02-16 DIAGNOSIS — Z20828 Contact with and (suspected) exposure to other viral communicable diseases: Secondary | ICD-10-CM | POA: Diagnosis not present

## 2021-02-16 DIAGNOSIS — Z1159 Encounter for screening for other viral diseases: Secondary | ICD-10-CM | POA: Diagnosis not present

## 2021-02-23 DIAGNOSIS — Z20828 Contact with and (suspected) exposure to other viral communicable diseases: Secondary | ICD-10-CM | POA: Diagnosis not present

## 2021-02-23 DIAGNOSIS — Z1159 Encounter for screening for other viral diseases: Secondary | ICD-10-CM | POA: Diagnosis not present

## 2021-03-02 DIAGNOSIS — Z20828 Contact with and (suspected) exposure to other viral communicable diseases: Secondary | ICD-10-CM | POA: Diagnosis not present

## 2021-03-02 DIAGNOSIS — Z1159 Encounter for screening for other viral diseases: Secondary | ICD-10-CM | POA: Diagnosis not present

## 2021-03-09 DIAGNOSIS — Z1159 Encounter for screening for other viral diseases: Secondary | ICD-10-CM | POA: Diagnosis not present

## 2021-03-09 DIAGNOSIS — Z20828 Contact with and (suspected) exposure to other viral communicable diseases: Secondary | ICD-10-CM | POA: Diagnosis not present

## 2021-03-16 DIAGNOSIS — Z20828 Contact with and (suspected) exposure to other viral communicable diseases: Secondary | ICD-10-CM | POA: Diagnosis not present

## 2021-03-16 DIAGNOSIS — Z1159 Encounter for screening for other viral diseases: Secondary | ICD-10-CM | POA: Diagnosis not present

## 2021-03-23 DIAGNOSIS — Z1159 Encounter for screening for other viral diseases: Secondary | ICD-10-CM | POA: Diagnosis not present

## 2021-03-23 DIAGNOSIS — Z20828 Contact with and (suspected) exposure to other viral communicable diseases: Secondary | ICD-10-CM | POA: Diagnosis not present

## 2021-03-30 DIAGNOSIS — Z1159 Encounter for screening for other viral diseases: Secondary | ICD-10-CM | POA: Diagnosis not present

## 2021-03-30 DIAGNOSIS — Z20828 Contact with and (suspected) exposure to other viral communicable diseases: Secondary | ICD-10-CM | POA: Diagnosis not present

## 2021-04-06 DIAGNOSIS — Z20828 Contact with and (suspected) exposure to other viral communicable diseases: Secondary | ICD-10-CM | POA: Diagnosis not present

## 2021-04-06 DIAGNOSIS — Z1159 Encounter for screening for other viral diseases: Secondary | ICD-10-CM | POA: Diagnosis not present

## 2021-04-13 DIAGNOSIS — Z20828 Contact with and (suspected) exposure to other viral communicable diseases: Secondary | ICD-10-CM | POA: Diagnosis not present

## 2021-04-13 DIAGNOSIS — Z1159 Encounter for screening for other viral diseases: Secondary | ICD-10-CM | POA: Diagnosis not present

## 2021-04-20 DIAGNOSIS — Z20828 Contact with and (suspected) exposure to other viral communicable diseases: Secondary | ICD-10-CM | POA: Diagnosis not present

## 2021-04-20 DIAGNOSIS — Z1159 Encounter for screening for other viral diseases: Secondary | ICD-10-CM | POA: Diagnosis not present

## 2021-04-27 DIAGNOSIS — Z20828 Contact with and (suspected) exposure to other viral communicable diseases: Secondary | ICD-10-CM | POA: Diagnosis not present

## 2021-04-27 DIAGNOSIS — Z1159 Encounter for screening for other viral diseases: Secondary | ICD-10-CM | POA: Diagnosis not present

## 2021-05-04 DIAGNOSIS — Z20828 Contact with and (suspected) exposure to other viral communicable diseases: Secondary | ICD-10-CM | POA: Diagnosis not present

## 2021-05-04 DIAGNOSIS — Z1159 Encounter for screening for other viral diseases: Secondary | ICD-10-CM | POA: Diagnosis not present

## 2021-05-11 DIAGNOSIS — Z20828 Contact with and (suspected) exposure to other viral communicable diseases: Secondary | ICD-10-CM | POA: Diagnosis not present

## 2021-05-11 DIAGNOSIS — Z1159 Encounter for screening for other viral diseases: Secondary | ICD-10-CM | POA: Diagnosis not present

## 2021-05-18 DIAGNOSIS — Z20828 Contact with and (suspected) exposure to other viral communicable diseases: Secondary | ICD-10-CM | POA: Diagnosis not present

## 2021-05-18 DIAGNOSIS — Z1159 Encounter for screening for other viral diseases: Secondary | ICD-10-CM | POA: Diagnosis not present

## 2021-05-25 DIAGNOSIS — Z20828 Contact with and (suspected) exposure to other viral communicable diseases: Secondary | ICD-10-CM | POA: Diagnosis not present

## 2021-05-25 DIAGNOSIS — Z1159 Encounter for screening for other viral diseases: Secondary | ICD-10-CM | POA: Diagnosis not present

## 2021-05-28 DIAGNOSIS — H04123 Dry eye syndrome of bilateral lacrimal glands: Secondary | ICD-10-CM | POA: Diagnosis not present

## 2021-05-28 DIAGNOSIS — Z961 Presence of intraocular lens: Secondary | ICD-10-CM | POA: Diagnosis not present

## 2021-06-01 DIAGNOSIS — Z1159 Encounter for screening for other viral diseases: Secondary | ICD-10-CM | POA: Diagnosis not present

## 2021-06-01 DIAGNOSIS — Z20828 Contact with and (suspected) exposure to other viral communicable diseases: Secondary | ICD-10-CM | POA: Diagnosis not present

## 2021-06-08 DIAGNOSIS — Z1159 Encounter for screening for other viral diseases: Secondary | ICD-10-CM | POA: Diagnosis not present

## 2021-06-08 DIAGNOSIS — Z20828 Contact with and (suspected) exposure to other viral communicable diseases: Secondary | ICD-10-CM | POA: Diagnosis not present

## 2021-06-09 NOTE — Progress Notes (Signed)
Phone [phone] In person visit   Subjective:   Bailey [name] is a 87 y.o. year old very pleasant female patient who presents for/with See problem oriented charting Chief Complaint  Patient presents with   Hypertension   This visit occurred during the SARS-CoV-2 public health emergency.  Safety protocols were in place, including screening questions prior to the visit, additional usage of staff PPE, and extensive cleaning of exam room while observing appropriate contact time as indicated for disinfecting solutions.   Past Medical History-  Patient Active Problem List   Diagnosis Date Noted   Sjogren's syndrome with keratoconjunctivitis sicca ([place]) 05/12/2017    Priority: High   Paroxysmal atrial fibrillation ([place]) 09/05/2008    Priority: High   CKD (chronic kidney disease), stage III ([place]) 12/08/2017    Priority: Medium   Mild aortic regurgitation 12/08/2017    Priority: Medium   HTN (hypertension) 03/13/2015    Priority: Medium   Raynauds syndrome 10/21/2014    Priority: Medium   Primary osteoarthritis of both hands 08/21/2009    Priority: Medium   PVC (premature ventricular contraction) 06/04/2009    Priority: Medium   Mitral valve prolapse 12/25/2008    Priority: Medium   Irritable bowel syndrome 09/04/2008    Priority: Medium   Premature ventricular contractions     Priority: Low   Hepatitis     Priority: Low   History of skin cancer     Priority: Low   Primary osteoarthritis of both feet 10/26/2016    Priority: Low   Anemia 03/13/2015    Priority: Low   Recurrent cold sores 10/21/2014    Priority: Low   Edema 06/29/2011    Priority: Low   Osteopenia 09/05/2008    Priority: Low   GERD 09/04/2008    Priority: Low   ALLERGY 09/04/2008    Priority: Low   COLONIC POLYPS 09/04/2008    Priority: Low   Dry eyes, bilateral 05/15/2018   Lumbar radiculopathy 12/12/2017   Allergy    Balance disorder 10/26/2016    Medications- reviewed and updated Current  Outpatient Medications  Medication Sig Dispense Refill   aspirin 81 MG tablet Take 81 mg by mouth daily.     carvedilol (COREG) 3.125 MG tablet TAKE 2 TABLETS BY MOUTH IN THE AM AND 1 TABLET BY MOUTH IN THE EVENING. 270 tablet 3   Cholecalciferol (VITAMIN D) 400 UNITS capsule Take 2 capsules by mouth daily     cycloSPORINE (RESTASIS) 0.05 % ophthalmic emulsion Place 1 drop into both eyes 2 (two) times daily.     estrogen, conjugated,-medroxyprogesterone (PREMPRO) 0.45-1.5 MG tablet Take 1 tablet by mouth daily. 84 tablet 4   fish oil-omega-3 fatty acids 1000 MG capsule Take one capsule by mouth  daily     losartan (COZAAR) 50 MG tablet Take 1 tablet (50 mg total) by mouth daily. 90 tablet 3   No current facility-administered medications for this visit.     Objective:  BP 120/67   Pulse 64   Temp 98.3 F (36.8 C) (Temporal)   Ht 5\' 2"  (1.575 m)   Wt 107 lb 9.6 oz (48.8 kg)   LMP  (LMP Unknown)   SpO2 98%   BMI 19.68 kg/m  Gen: NAD, resting comfortably CV: RRR no murmurs rubs or gallops Lungs: CTAB no crackles, wheeze, rhonchi Ext: no edema Skin: warm, dry      Assessment and Plan   #Social update-. Moved into [place] at [facility] january 2021. Husband  with [employer] and this has been really hard on her - she is getting burnt out and her focus is on him most of the time. Has help in morning and evenings.  -she is also more limited in mobility compared to when she lived in her home. Trying to get to exercise if she can but he often has visits or scheduled classes.   -with caregiver burden-we opted to trial low-dose of Lexapro 5 mg in the morning-I asked her to follow-up with me in 6 weeks  # mobility concern- had one fall in last 6 months. Family states smaller steps. Sometimes feels dizzy if turns to quickly- will refer to PT.   #hypertension/CKD stage III S: compliant with losartan 50 mg, carvedilol 6.5 mg in the morning and 3.25 mg in the evening  Creatinine has been  stable around 0.9-1 but she is rather thin. Avoid nsaids.  BP Readings from Last 3 Encounters:  06/10/21 120/67  01/13/21 130/61  12/30/20 (!) 160/62  A/P: blood pressure - Stable. Continue current medications.   CKD III -hopefully stable- update cmp today. Continue current meds for now  #Paroxysmal atrial fibrillation/palpitations-follows with Dr. Johnsie Cancel- no obvious recurrence after dental procedure. She is on aspirin alone.  She is on carvedilol for blood pressure which would offer some rate control if needed but is primarily used for blood pressure  #Moderate aortic regurgitation-mild 2021- moderate noted 12/26/20- continue cardiology follow up  #some sinus congestion and occasional cough - cant take antihistamines with sjogrens. Hasnt done well with nasal sprays. Discussed sinus rinse options    Recommended follow up: No follow-ups on file. Future Appointments  Date Time Provider [place]  07/08/2021 11:00 AM Ofilia Neas, PA-C [facility] None  09/21/2021 11:00 AM Joseph Pierini, MD [facility] None  11/05/2021  1:45 PM [facility] HEALTH COACH [facility]    Lab/Order associations:   ICD-10-CM   1. Paroxysmal atrial fibrillation (HCC)  I48.0     2. Hypertension, unspecified type  I10     3. Sjogren's syndrome with keratoconjunctivitis sicca (HCC)  M35.01     4. Stage 3 chronic kidney disease, unspecified whether stage 3a or 3b CKD ([place])  N18.30       No orders of the defined types were placed in this encounter.   I,Alexis Bryant,acting as a Education administrator for Garret Reddish, MD.,have documented all relevant documentation on the behalf of Garret Reddish, MD,as directed by  Garret Reddish, MD while in the presence of Garret Reddish, MD.   I, Garret Reddish, MD, have reviewed all documentation for this visit. The documentation on 06/10/21 for the exam, diagnosis, procedures, and orders are all accurate and complete.  Return precautions advised.  Garret Reddish, MD

## 2021-06-10 ENCOUNTER — Other Ambulatory Visit: Payer: Self-pay

## 2021-06-10 ENCOUNTER — Ambulatory Visit (INDEPENDENT_AMBULATORY_CARE_PROVIDER_SITE_OTHER): Payer: Medicare Other | Admitting: Family Medicine

## 2021-06-10 ENCOUNTER — Encounter: Payer: Self-pay | Admitting: Family Medicine

## 2021-06-10 VITALS — BP 120/67 | HR 64 | Temp 98.3°F | Ht 62.0 in | Wt 107.6 lb

## 2021-06-10 DIAGNOSIS — I10 Essential (primary) hypertension: Secondary | ICD-10-CM | POA: Diagnosis not present

## 2021-06-10 DIAGNOSIS — R269 Unspecified abnormalities of gait and mobility: Secondary | ICD-10-CM | POA: Diagnosis not present

## 2021-06-10 DIAGNOSIS — M3501 Sicca syndrome with keratoconjunctivitis: Secondary | ICD-10-CM

## 2021-06-10 DIAGNOSIS — I480 Paroxysmal atrial fibrillation: Secondary | ICD-10-CM | POA: Diagnosis not present

## 2021-06-10 DIAGNOSIS — N183 Chronic kidney disease, stage 3 unspecified: Secondary | ICD-10-CM | POA: Diagnosis not present

## 2021-06-10 LAB — CBC WITH DIFFERENTIAL/PLATELET
Basophils Absolute: 0 10*3/uL (ref 0.0–0.1)
Basophils Relative: 0.5 % (ref 0.0–3.0)
Eosinophils Absolute: 0.1 10*3/uL (ref 0.0–0.7)
Eosinophils Relative: 0.8 % (ref 0.0–5.0)
HCT: 35.4 % — ABNORMAL LOW (ref 36.0–46.0)
Hemoglobin: 11.9 g/dL — ABNORMAL LOW (ref 12.0–15.0)
Lymphocytes Relative: 19.5 % (ref 12.0–46.0)
Lymphs Abs: 2 10*3/uL (ref 0.7–4.0)
MCHC: 33.7 g/dL (ref 30.0–36.0)
MCV: 91.6 fl (ref 78.0–100.0)
Monocytes Absolute: 0.9 10*3/uL (ref 0.1–1.0)
Monocytes Relative: 9.2 % (ref 3.0–12.0)
Neutro Abs: 7.1 10*3/uL (ref 1.4–7.7)
Neutrophils Relative %: 70 % (ref 43.0–77.0)
Platelets: 298 10*3/uL (ref 150.0–400.0)
RBC: 3.86 Mil/uL — ABNORMAL LOW (ref 3.87–5.11)
RDW: 12.9 % (ref 11.5–15.5)
WBC: 10.1 10*3/uL (ref 4.0–10.5)

## 2021-06-10 LAB — COMPREHENSIVE METABOLIC PANEL
ALT: 9 U/L (ref 0–35)
AST: 14 U/L (ref 0–37)
Albumin: 4 g/dL (ref 3.5–5.2)
Alkaline Phosphatase: 48 U/L (ref 39–117)
BUN: 19 mg/dL (ref 6–23)
CO2: 27 mEq/L (ref 19–32)
Calcium: 9.3 mg/dL (ref 8.4–10.5)
Chloride: 103 mEq/L (ref 96–112)
Creatinine, Ser: 0.98 mg/dL (ref 0.40–1.20)
GFR: 52.51 mL/min — ABNORMAL LOW (ref >60.00)
Glucose, Bld: 100 mg/dL — ABNORMAL HIGH (ref 70–99)
Potassium: 4.5 mEq/L (ref 3.5–5.1)
Sodium: 137 mEq/L (ref 135–145)
Total Bilirubin: 0.4 mg/dL (ref 0.2–1.2)
Total Protein: 7.1 g/dL (ref 6.0–8.3)

## 2021-06-10 MED ORDER — ESCITALOPRAM OXALATE 5 MG PO TABS: 5.0000 mg | ORAL_TABLET | Freq: Every day | ORAL | 11 refills | Status: DC

## 2021-06-10 NOTE — Patient Instructions (Addendum)
Health Maintenance Due  Topic Date Due   Zoster Vaccines- Shingrix (1 of 2) Please check with your pharmacy to see if they have the shingrix vaccine. If they do- please get this immunization and update [place] by phone call or mychart with dates you receive the vaccine  Never done    [place] Lexepro and follow up in 6 weeks   Taking the medicine as directed and not missing any doses is one of the best things you can do to treat your anxiety and caregiver burden.  Here are some things to keep in mind:  Side effects (stomach upset, some increased anxiety) may happen before you notice a benefit.  These side effects typically go away over time. Changes to your dose of medicine or a change in medication all together is sometimes necessary Most people need to be on medication at least 6-12 months Many people will notice an improvement within two weeks but the full effect of the medication can take up to 4-6 weeks Stopping the medication when you start feeling better often results in a return of symptoms If you start having thoughts of hurting yourself or others after starting this medicine, call our office immediately at [phone] or seek care through 911.     Please call [phone] to schedule a visit with [facility]  We will call you within two weeks about your referral to PT at [facility]. If you do not hear within 2 weeks, give [place] a call.   Try Neti Pot or Neilmed for sinuses   Please stop by lab before you go If you have mychart- we will send your results within 3 business days of [place] receiving them.  If you do not have mychart- we will call you about results within 5 business days of [place] receiving them.  *please also note that you will see labs on mychart as soon as they post. I will later go in and write notes on them- will say "notes from Dr. Yong Channel"  Recommended follow up: Return in about 6 weeks (around 07/22/2021) for Follow up. If doing great can cancel and see [place] at 6  months

## 2021-06-15 DIAGNOSIS — Z20828 Contact with and (suspected) exposure to other viral communicable diseases: Secondary | ICD-10-CM | POA: Diagnosis not present

## 2021-06-15 DIAGNOSIS — Z1159 Encounter for screening for other viral diseases: Secondary | ICD-10-CM | POA: Diagnosis not present

## 2021-06-18 ENCOUNTER — Telehealth: Payer: Self-pay

## 2021-06-18 NOTE — Telephone Encounter (Signed)
Bailey [name], please see message.

## 2021-06-18 NOTE — Telephone Encounter (Signed)
Spoke to pt told her calling about PT referral. Told her being as you are a resident at [facility] you need to call or go there and schedule an appt yourself. They will not let us schedule the appt and they do have the referral. Pt verbalized understanding.

## 2021-06-18 NOTE — Telephone Encounter (Signed)
Patient called in for a referral for physical therapy at [facility]. She has not heard anything and the physical therapist there hasn't either. She wanted to know if anything has been put in yet

## 2021-06-22 DIAGNOSIS — Z20828 Contact with and (suspected) exposure to other viral communicable diseases: Secondary | ICD-10-CM | POA: Diagnosis not present

## 2021-06-22 DIAGNOSIS — Z1159 Encounter for screening for other viral diseases: Secondary | ICD-10-CM | POA: Diagnosis not present

## 2021-06-25 NOTE — Progress Notes (Deleted)
Office Visit Note  Patient: Bailey [name]             Date of Birth: October 08, 1933           MRN: [id number]             PCP: Marin Olp, MD Referring: Marin Olp, MD Visit Date: 07/08/2021 Occupation: '@GUAROCC'$ @  Subjective:  No chief complaint on file.   History of Present Illness: Bailey [name] is a 87 y.o. female ***   Activities of Daily Living:  Patient reports morning stiffness for *** {minute/hour:19697}.   Patient {ACTIONS;DENIES/REPORTS:21021675::"Denies"} nocturnal pain.  Difficulty dressing/grooming: {ACTIONS;DENIES/REPORTS:21021675::"Denies"} Difficulty climbing stairs: {ACTIONS;DENIES/REPORTS:21021675::"Denies"} Difficulty getting out of chair: {ACTIONS;DENIES/REPORTS:21021675::"Denies"} Difficulty using hands for taps, buttons, cutlery, and/or writing: {ACTIONS;DENIES/REPORTS:21021675::"Denies"}  No Rheumatology ROS completed.   PMFS History:  Patient Active Problem List   Diagnosis Date Noted   Dry eyes, bilateral 05/15/2018   Lumbar radiculopathy 12/12/2017   Allergy    CKD (chronic kidney disease), stage III ([place]) 12/08/2017   Mild aortic regurgitation 12/08/2017   Premature ventricular contractions    Hepatitis    History of skin cancer    Sjogren's syndrome with keratoconjunctivitis sicca ([place]) 05/12/2017   Balance disorder 10/26/2016   Primary osteoarthritis of both feet 10/26/2016   HTN (hypertension) 03/13/2015   Anemia 03/13/2015   Raynauds syndrome 10/21/2014   Recurrent cold sores 10/21/2014   Edema 06/29/2011   Primary osteoarthritis of both hands 08/21/2009   PVC (premature ventricular contraction) 06/04/2009   Mitral valve prolapse 12/25/2008   Paroxysmal atrial fibrillation ([place]) 09/05/2008   Osteopenia 09/05/2008   GERD 09/04/2008   Irritable bowel syndrome 09/04/2008   ALLERGY 09/04/2008   COLONIC POLYPS 09/04/2008    Past Medical History:  Diagnosis Date   Allergy, unspecified not elsewhere classified    Anemia,  unspecified    Benign neoplasm of colon    Cancer ([place])    basal/sqamous/transitional melanoma   CKD (chronic kidney disease), stage III ([place]) 12/08/2017   COLONIC POLYPS 09/04/2008   Was told by Gi does not need anymore.     Diverticulosis of colon (without mention of hemorrhage)    Dry eye syndrome    Dysrhythmia    PVCs   Esophageal reflux    Hepatitis 1950s   infectious from food   History of atrial fibrillation    Hypertension    Irritable bowel syndrome    Mitral valve disorders(424.0)    Moderate aortic regurgitation 12/08/2017   Osteopenia 06/2018   T score -1.8 FRAX 10% / 2.5%   Palpitations    Premature ventricular contractions    Rheumatoid arthritis(714.0)    Sicca syndrome ([place])    Ulcer 1992   Vaginal delivery 1959, 1960, 1962, 1964    Family History  Problem Relation Age of Onset   Stroke Mother    Hypertension Mother    Ulcerative colitis Father    Heart attack Father    Breast cancer Maternal Grandmother        Age 57's   Uterine cancer Maternal Grandmother    Hypertension Maternal Grandfather    Colon cancer Paternal Grandmother    Past Surgical History:  Procedure Laterality Date   APPENDECTOMY     CATARACT EXTRACTION     CHOLECYSTECTOMY     colon tumor  1969   Benign rectal tumor excised   DILATATION & CURETTAGE/HYSTEROSCOPY WITH MYOSURE N/A 01/21/2015   Procedure: DILATATION & CURETTAGE/HYSTEROSCOPY WITH MYOSURE;  [name]: [name],  MD;  Location: [facility];  Service: Gynecology;  Laterality: N/A;   HIATAL HERNIA REPAIR     REFRACTIVE SURGERY     ROTATOR CUFF REPAIR  2009   right by Dr. French Ana   SKIN BIOPSY  11/2020   squamous cell removed  2013   Dr. Sherrye Payor --removed from her right cheek   TUBAL LIGATION     Social History   Social History Narrative   Married 1958. 4 children. 10 grandkids (1 trying to go to medical school). No greatgrandkids.       Retired from nursing (medical and surgical, most recently in cardiology)    Degree in history and english after finished nursing- 2 mo before 72th birthday      Hobbies: education, travel, reading, piano    Immunization History  Administered Date(s) Administered   Fluad Quad(high Dose 65+) 08/21/2019   H1N1 11/26/2008   Influenza Split 08/30/2011, 10/31/2012   Influenza Whole 09/05/2008, 08/27/2009, 08/20/2010   Influenza, High Dose Seasonal PF 08/31/2016, 09/05/2017, 08/30/2018   Influenza,inj,Quad PF,6+ Mos 09/13/2013, 10/04/2014, 10/01/2015   Influenza-Unspecified 08/26/2020   Moderna SARS-COV2 Booster Vaccination 03/30/2021   Moderna Sars-Covid-2 Vaccination 12/20/2019, 01/17/2020, 10/02/2020   Pneumococcal Conjugate-13 10/21/2014   Pneumococcal Polysaccharide-23 10/11/2015   Td 02/17/2010   Tdap 03/02/2021   Zoster, Live 11/29/2008     Objective: Vital Signs: LMP  (LMP Unknown)    Physical Exam   Musculoskeletal Exam: ***  CDAI Exam: CDAI Score: -- Patient Global: --; Provider Global: -- Swollen: --; Tender: -- Joint Exam 07/08/2021   No joint exam has been documented for this visit   There is currently no information documented on the homunculus. Go to the Rheumatology activity and complete the homunculus joint exam.  Investigation: No additional findings.  Imaging: No results found.  Recent Labs: Lab Results  Component Value Date   WBC 10.1 06/10/2021   HGB 11.9 (L) 06/10/2021   PLT 298.0 06/10/2021   NA 137 06/10/2021   K 4.5 06/10/2021   CL 103 06/10/2021   CO2 27 06/10/2021   GLUCOSE 100 (H) 06/10/2021   BUN 19 06/10/2021   CREATININE 0.98 06/10/2021   BILITOT 0.4 06/10/2021   ALKPHOS 48 06/10/2021   AST 14 06/10/2021   ALT 9 06/10/2021   PROT 7.1 06/10/2021   ALBUMIN 4.0 06/10/2021   CALCIUM 9.3 06/10/2021   GFRAA 61 11/14/2018    Speciality Comments: No specialty comments available.  Procedures:  No procedures performed Allergies: Diamox [acetazolamide], Epinephrine-lidocaine-na metabisulfite  [lidocaine-epinephrine], Erythromycin, Penicillins, Sulfonamide derivatives, and Tape   Assessment / Plan:     Visit Diagnoses: No diagnosis found.  Orders: No orders of the defined types were placed in this encounter.  No orders of the defined types were placed in this encounter.   Face-to-face time spent with patient was *** minutes. Greater than 50% of time was spent in counseling and coordination of care.  Follow-Up Instructions: No follow-ups on file.   Earnestine Mealing, CMA  Note - This record has been created using Editor, commissioning.  Chart creation errors have been sought, but may not always  have been located. Such creation errors do not reflect on  the standard of medical care.

## 2021-06-29 DIAGNOSIS — Z1159 Encounter for screening for other viral diseases: Secondary | ICD-10-CM | POA: Diagnosis not present

## 2021-06-29 DIAGNOSIS — Z20828 Contact with and (suspected) exposure to other viral communicable diseases: Secondary | ICD-10-CM | POA: Diagnosis not present

## 2021-06-30 ENCOUNTER — Telehealth: Payer: Self-pay

## 2021-06-30 DIAGNOSIS — R269 Unspecified abnormalities of gait and mobility: Secondary | ICD-10-CM

## 2021-06-30 NOTE — Telephone Encounter (Signed)
Patients is requesting the PT referral to be sent to [facility] instead of our office [phone]

## 2021-07-01 NOTE — Telephone Encounter (Signed)
Referral has been placed to [facility].

## 2021-07-03 ENCOUNTER — Other Ambulatory Visit: Payer: Self-pay | Admitting: Family Medicine

## 2021-07-06 DIAGNOSIS — Z20828 Contact with and (suspected) exposure to other viral communicable diseases: Secondary | ICD-10-CM | POA: Diagnosis not present

## 2021-07-07 ENCOUNTER — Telehealth: Payer: Self-pay

## 2021-07-07 NOTE — Telephone Encounter (Signed)
Davis from [facility] [employer] called to check on a referral to [facility] for physical therapy. He would like it faxed and resent to [phone]. If there are any questions call [phone]

## 2021-07-08 ENCOUNTER — Ambulatory Visit: Payer: Medicare Other | Admitting: Physician Assistant

## 2021-07-08 DIAGNOSIS — R5383 Other fatigue: Secondary | ICD-10-CM

## 2021-07-08 DIAGNOSIS — M8589 Other specified disorders of bone density and structure, multiple sites: Secondary | ICD-10-CM

## 2021-07-08 DIAGNOSIS — M19071 Primary osteoarthritis, right ankle and foot: Secondary | ICD-10-CM

## 2021-07-08 DIAGNOSIS — M7712 Lateral epicondylitis, left elbow: Secondary | ICD-10-CM

## 2021-07-08 DIAGNOSIS — M19041 Primary osteoarthritis, right hand: Secondary | ICD-10-CM

## 2021-07-08 DIAGNOSIS — G8929 Other chronic pain: Secondary | ICD-10-CM

## 2021-07-08 DIAGNOSIS — M3501 Sicca syndrome with keratoconjunctivitis: Secondary | ICD-10-CM

## 2021-07-08 DIAGNOSIS — Z862 Personal history of diseases of the blood and blood-forming organs and certain disorders involving the immune mechanism: Secondary | ICD-10-CM

## 2021-07-08 DIAGNOSIS — Z8679 Personal history of other diseases of the circulatory system: Secondary | ICD-10-CM

## 2021-07-08 DIAGNOSIS — Z8719 Personal history of other diseases of the digestive system: Secondary | ICD-10-CM

## 2021-07-08 DIAGNOSIS — I7300 Raynaud's syndrome without gangrene: Secondary | ICD-10-CM

## 2021-07-08 DIAGNOSIS — R2689 Other abnormalities of gait and mobility: Secondary | ICD-10-CM

## 2021-07-09 DIAGNOSIS — R2681 Unsteadiness on feet: Secondary | ICD-10-CM | POA: Diagnosis not present

## 2021-07-09 DIAGNOSIS — M6281 Muscle weakness (generalized): Secondary | ICD-10-CM | POA: Diagnosis not present

## 2021-07-09 DIAGNOSIS — R2689 Other abnormalities of gait and mobility: Secondary | ICD-10-CM | POA: Diagnosis not present

## 2021-07-13 ENCOUNTER — Ambulatory Visit: Payer: Medicare Other | Admitting: Physician Assistant

## 2021-07-13 DIAGNOSIS — Z20828 Contact with and (suspected) exposure to other viral communicable diseases: Secondary | ICD-10-CM | POA: Diagnosis not present

## 2021-07-14 DIAGNOSIS — M6281 Muscle weakness (generalized): Secondary | ICD-10-CM | POA: Diagnosis not present

## 2021-07-14 DIAGNOSIS — R2681 Unsteadiness on feet: Secondary | ICD-10-CM | POA: Diagnosis not present

## 2021-07-14 DIAGNOSIS — R2689 Other abnormalities of gait and mobility: Secondary | ICD-10-CM | POA: Diagnosis not present

## 2021-07-15 DIAGNOSIS — Z20828 Contact with and (suspected) exposure to other viral communicable diseases: Secondary | ICD-10-CM | POA: Diagnosis not present

## 2021-07-15 NOTE — Progress Notes (Signed)
Office Visit Note  Patient: Bailey [name]             Date of Birth: [birth year]           MRN: [id number]             PCP: Marin Olp, MD Referring: Marin Olp, MD Visit Date: 07/29/2021 Occupation: '@GUAROCC'$ @  Subjective:  Lower extremity instability   History of Present Illness: Bailey [name] is a 87 y.o. female with history of sjogren's syndrome and osteoarthritis.  She is not taking any immunosuppressive agents for the management of Sjogren's syndrome.  She continues to have chronic sicca symptoms but her symptoms have been tolerable using Biotene oral rinse and Restasis eyedrops on a daily basis.  She continues to see her ophthalmologist and dentist every 6 months as recommended.  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  She is not experiencing any joint swelling.  Her discomfort has improved since avoiding has been grabbing her hands when trying to rise from a seated position.  She continues to go to exercise classes 3 days a week and has been working with a therapist 2 days a week for lower extremity muscle strengthening and fall prevention.  She has started to notice improvement in her strength.  She has been using a cane to assist with ambulation.  She has not had any recent falls.   Activities of Daily Living:  Patient reports morning stiffness for 5-10 minutes.   Patient Denies nocturnal pain.  Difficulty dressing/grooming: Denies Difficulty climbing stairs: Denies Difficulty getting out of chair: Denies Difficulty using hands for taps, buttons, cutlery, and/or writing: Denies  Review of Systems  Constitutional:  Positive for fatigue.  HENT:  Positive for mouth sores, mouth dryness and nose dryness.   Eyes:  Positive for dryness. Negative for pain and itching.  Respiratory:  Negative for shortness of breath and difficulty breathing.   Cardiovascular:  Negative for  chest pain and palpitations.  Gastrointestinal:  Negative for blood in stool, constipation and diarrhea.  Endocrine: Negative for increased urination.  Genitourinary:  Negative for difficulty urinating.  Musculoskeletal:  Positive for muscle weakness and morning stiffness. Negative for joint pain, joint pain, joint swelling, myalgias, muscle tenderness and myalgias.  Skin:  Negative for color change, rash and redness.  Allergic/Immunologic: Negative for susceptible to infections.  Neurological:  Positive for dizziness. Negative for numbness, headaches and memory loss.  Hematological:  Positive for bruising/bleeding tendency.  Psychiatric/Behavioral:  Negative for confusion.    PMFS History:  Patient Active Problem List   Diagnosis Date Noted   Dry eyes, bilateral 05/15/2018   Lumbar radiculopathy 12/12/2017   Allergy    CKD (chronic kidney disease), stage III ([place]) 12/08/2017   Mild aortic regurgitation 12/08/2017   Premature ventricular contractions    Hepatitis    History of skin cancer    Sjogren's syndrome with keratoconjunctivitis sicca ([place]) 05/12/2017   Balance disorder 10/26/2016   Primary osteoarthritis of both feet 10/26/2016   HTN (hypertension) 03/13/2015   Anemia 03/13/2015   Raynauds syndrome 10/21/2014   Recurrent cold sores 10/21/2014   Edema 06/29/2011   Primary osteoarthritis of both hands 08/21/2009   PVC (premature ventricular contraction) 06/04/2009   Mitral valve prolapse 12/25/2008   Paroxysmal atrial fibrillation ([place]) 09/05/2008   Osteopenia 09/05/2008   GERD 09/04/2008   Irritable bowel syndrome 09/04/2008  ALLERGY 09/04/2008   COLONIC POLYPS 09/04/2008    Past Medical History:  Diagnosis Date   Allergy, unspecified not elsewhere classified    Anemia, unspecified    Benign neoplasm of colon    Cancer ([place])    basal/sqamous/transitional melanoma   CKD (chronic kidney disease), stage III ([place]) 12/08/2017   COLONIC POLYPS 09/04/2008   Was told by  Gi does not need anymore.     Diverticulosis of colon (without mention of hemorrhage)    Dry eye syndrome    Dysrhythmia    PVCs   Esophageal reflux    Hepatitis 1950s   infectious from food   History of atrial fibrillation    Hypertension    Irritable bowel syndrome    Mitral valve disorders(424.0)    Moderate aortic regurgitation 12/08/2017   Osteopenia 06/2018   T score -1.8 FRAX 10% / 2.5%   Palpitations    Premature ventricular contractions    Rheumatoid arthritis(714.0)    Sicca syndrome ([place])    Ulcer 1992   Vaginal delivery 1959, 1960, 1962, 1964    Family History  Problem Relation Age of Onset   Stroke Mother    Hypertension Mother    Ulcerative colitis Father    Heart attack Father    Breast cancer Maternal Grandmother        Age 72's   Uterine cancer Maternal Grandmother    Hypertension Maternal Grandfather    Colon cancer Paternal Grandmother    Past Surgical History:  Procedure Laterality Date   APPENDECTOMY     CATARACT EXTRACTION     CHOLECYSTECTOMY     colon tumor  1969   Benign rectal tumor excised   DILATATION & CURETTAGE/HYSTEROSCOPY WITH MYOSURE N/A 01/21/2015   Procedure: DILATATION & CURETTAGE/HYSTEROSCOPY WITH MYOSURE;  Surgeon: [name], MD;  Location: [facility];  Service: Gynecology;  Laterality: N/A;   HIATAL HERNIA REPAIR     REFRACTIVE SURGERY     ROTATOR CUFF REPAIR  2009   right by Dr. French Ana   SKIN BIOPSY  11/2020   squamous cell removed  2013   Dr. Sherrye Payor --removed from her right cheek   TUBAL LIGATION     Social History   Social History Narrative   Married 1958. 4 children. 10 grandkids (1 trying to go to medical school). No greatgrandkids.       Retired from nursing (medical and surgical, most recently in cardiology)   Degree in history and english after finished nursing- 2 mo before 72th birthday      Hobbies: education, travel, reading, piano    Immunization History  Administered Date(s) Administered   Fluad  Quad(high Dose 65+) 08/21/2019   H1N1 11/26/2008   Influenza Split 08/30/2011, 10/31/2012   Influenza Whole 09/05/2008, 08/27/2009, 08/20/2010   Influenza, High Dose Seasonal PF 08/31/2016, 09/05/2017, 08/30/2018   Influenza,inj,Quad PF,6+ Mos 09/13/2013, 10/04/2014, 10/01/2015   Influenza-Unspecified 08/26/2020   Moderna SARS-COV2 Booster Vaccination 03/30/2021   Moderna Sars-Covid-2 Vaccination 12/20/2019, 01/17/2020, 10/02/2020   Pneumococcal Conjugate-13 10/21/2014   Pneumococcal Polysaccharide-23 10/11/2015   Td 02/17/2010   Tdap 03/02/2021   Zoster, Live 11/29/2008     Objective: Vital Signs: BP (!) 144/81 (BP Location: Left Arm, Patient Position: Sitting, Cuff Size: Normal)   Pulse 76   Ht '5\' 2"'$  (1.575 m)   Wt 109 lb (49.4 kg)   LMP  (LMP Unknown)   BMI 19.94 kg/m    Physical Exam Vitals and nursing note reviewed.  Constitutional:  Appearance: She is well-developed.  HENT:     Head: Normocephalic and atraumatic.  Eyes:     Conjunctiva/sclera: Conjunctivae normal.  Pulmonary:     Effort: Pulmonary effort is normal.  Abdominal:     Palpations: Abdomen is soft.  Musculoskeletal:     Cervical back: Normal range of motion.  Lymphadenopathy:     Cervical: No cervical adenopathy.  Skin:    General: Skin is warm and dry.     Capillary Refill: Capillary refill takes less than 2 seconds.  Neurological:     Mental Status: She is alert and oriented to person, place, and time.  Psychiatric:        Behavior: Behavior normal.     Musculoskeletal Exam: C-spine has slightly limited ROM with lateral rotation. Thoracic spin and lumbar spine good ROM.  No midline spinal tenderness or SI joint tenderness. Shoulder joints, elbow joints, wrist joints, MCPs, PIPs, and DIPs good ROM with no synovitis.  PIP and DIP thickening.  Tenderness over both CMC joints.  Hip joints, knee joints, and ankle joints good ROM with no discomfort.  No warmth or effusion of knee joints.  No  tenderness or  swelling of ankle joints.  CDAI Exam: CDAI Score: -- Patient Global: --; Provider Global: -- Swollen: --; Tender: -- Joint Exam 07/29/2021   No joint exam has been documented for this visit   There is currently no information documented on the homunculus. Go to the Rheumatology activity and complete the homunculus joint exam.  Investigation: No additional findings.  Imaging: No results found.  Recent Labs: Lab Results  Component Value Date   WBC 10.1 06/10/2021   HGB 11.9 (L) 06/10/2021   PLT 298.0 06/10/2021   NA 137 06/10/2021   K 4.5 06/10/2021   CL 103 06/10/2021   CO2 27 06/10/2021   GLUCOSE 100 (H) 06/10/2021   BUN 19 06/10/2021   CREATININE 0.98 06/10/2021   BILITOT 0.4 06/10/2021   ALKPHOS 48 06/10/2021   AST 14 06/10/2021   ALT 9 06/10/2021   PROT 7.1 06/10/2021   ALBUMIN 4.0 06/10/2021   CALCIUM 9.3 06/10/2021   GFRAA 61 11/14/2018    Speciality Comments: No specialty comments available.  Procedures:  No procedures performed Allergies: Diamox [acetazolamide], Epinephrine-lidocaine-na metabisulfite [lidocaine-epinephrine], Erythromycin, Penicillins, Sulfonamide derivatives, and Tape   Assessment / Plan:     Visit Diagnoses: Sjogren's syndrome with keratoconjunctivitis sicca ([place]) - -RF,-SPEP 11/17: She continues to have chronic sicca symptoms which have been tolerable overall.  She has been using Restasis eyedrops on a daily basis as well as Biotene oral rinse to alleviate her symptoms.  She continues to see her ophthalmologist and dentist every 6 months as recommended.  She is not currently taking any immunosuppressive agents.  She has no signs of inflammatory arthritis at this time.  No synovitis was noted on examination today.  She has not been experiencing any increased shortness of breath or pleuritic chest pain.  She was advised to notify us if she develops any new or worsening symptoms.  She will follow-up in the office in 6  months.  Raynaud's disease without gangrene: Not currently active.  No digital ulcerations, signs of gangrene, or signs of sclerodactyly were noted.  Lateral epicondylitis, left elbow: Resolved.  Chronic midline low back pain without sciatica - She has been working with physical therapy twice a week on lower extremity and lower back strengthening and stretching exercises.  She has been working on fall prevention as well and  has been using a cane to assist with ambulation.  She is not experiencing any symptoms of radiculopathy at this time.  She has no midline spinal tenderness.  Primary osteoarthritis of both hands: PIP and DIP prominence consistent with osteoarthritis of both hands noted.  Some tenderness over bilateral CMC joints noted.  She has been experiencing intermittent discomfort and stiffness in both hands typically exacerbated by overuse activities.  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  Discussed the importance of joint protection and muscle strengthening. She was given a hangout of hand exercises to perform.  She was given a jar gripper for assistance.   Primary osteoarthritis of both feet: She is not experiencing any discomfort in her feet at this time.  She has good range of motion of both ankle joints with no tenderness or inflammation.  She is wearing proper fitting shoes.  Osteopenia of multiple sites - DEXA 08/05/2020 T-score: -1.6, BMD: 0.668 left femoral neck. DEXA 07/26/18 L femoral neck T-score -1.8.  She remains on a vitamin D supplement as recommended.  She has not had any recent falls or fractures.  She was noticing some increased instability in bilateral lower extremities so her PCP recommended proceeding with physical therapy.  She has been working with a physical therapist twice a week on lower extremity muscle strengthening and fall prevention.  She has been using a cane to assist with ambulation.  She has started to notice improvement in her strength.  Other  fatigue: Overall her energy level has been stable.  She has been going to an exercise class 3 days a week and working with a physical therapist twice a week.  We discussed the importance of regular exercise and good sleep hygiene.  Other medical conditions are listed as follows:   History of anemia: Stable.  Monitored by PCP.   History of IBS: Improved since starting on lexapro 5 mg daily.   Balance disorder  History of atrial fibrillation  History of hypertension: BP was 144/81 today in the office.  She monitors her blood pressure closely at home.   History of gastroesophageal reflux (GERD)  Orders: No orders of the defined types were placed in this encounter.  No orders of the defined types were placed in this encounter.    Follow-Up Instructions: Return in about 6 months (around 01/26/2022) for Sjogren's syndrome, Osteoarthritis.   Ofilia Neas, PA-C  Note - This record has been created using Dragon software.  Chart creation errors have been sought, but may not always  have been located. Such creation errors do not reflect on  the standard of medical care.

## 2021-07-16 DIAGNOSIS — M6281 Muscle weakness (generalized): Secondary | ICD-10-CM | POA: Diagnosis not present

## 2021-07-16 DIAGNOSIS — R2681 Unsteadiness on feet: Secondary | ICD-10-CM | POA: Diagnosis not present

## 2021-07-16 DIAGNOSIS — R2689 Other abnormalities of gait and mobility: Secondary | ICD-10-CM | POA: Diagnosis not present

## 2021-07-20 DIAGNOSIS — Z8616 Personal history of COVID-19: Secondary | ICD-10-CM | POA: Diagnosis not present

## 2021-07-20 NOTE — Progress Notes (Signed)
Phone [phone] In person visit   Subjective:   Bailey [name] is a 87 y.o. year old very pleasant female patient who presents for/with See problem oriented charting Chief Complaint  Patient presents with   Paroxysmal atrial fibrillation/palpitations    This visit occurred during the SARS-CoV-2 public health emergency.  Safety protocols were in place, including screening questions prior to the visit, additional usage of staff PPE, and extensive cleaning of exam room while observing appropriate contact time as indicated for disinfecting solutions.   Past Medical History-  Patient Active Problem List   Diagnosis Date Noted   Sjogren's syndrome with keratoconjunctivitis sicca ([place]) 05/12/2017    Priority: High   Paroxysmal atrial fibrillation ([place]) 09/05/2008    Priority: High   CKD (chronic kidney disease), stage III ([place]) 12/08/2017    Priority: Medium   Mild aortic regurgitation 12/08/2017    Priority: Medium   HTN (hypertension) 03/13/2015    Priority: Medium   Raynauds syndrome 10/21/2014    Priority: Medium   Primary osteoarthritis of both hands 08/21/2009    Priority: Medium   PVC (premature ventricular contraction) 06/04/2009    Priority: Medium   Mitral valve prolapse 12/25/2008    Priority: Medium   Irritable bowel syndrome 09/04/2008    Priority: Medium   Premature ventricular contractions     Priority: Low   Hepatitis     Priority: Low   History of skin cancer     Priority: Low   Primary osteoarthritis of both feet 10/26/2016    Priority: Low   Anemia 03/13/2015    Priority: Low   Recurrent cold sores 10/21/2014    Priority: Low   Edema 06/29/2011    Priority: Low   Osteopenia 09/05/2008    Priority: Low   GERD 09/04/2008    Priority: Low   ALLERGY 09/04/2008    Priority: Low   COLONIC POLYPS 09/04/2008    Priority: Low   Dry eyes, bilateral 05/15/2018   Lumbar radiculopathy 12/12/2017   Allergy    Balance disorder 10/26/2016     Medications- reviewed and updated Current Outpatient Medications  Medication Sig Dispense Refill   aspirin 81 MG tablet Take 81 mg by mouth daily.     carvedilol (COREG) 3.125 MG tablet TAKE 2 TABLETS BY MOUTH IN THE AM AND 1 TABLET BY MOUTH IN THE EVENING. 270 tablet 3   Cholecalciferol (VITAMIN D) 400 UNITS capsule Take 2 capsules by mouth daily     cycloSPORINE (RESTASIS) 0.05 % ophthalmic emulsion Place 1 drop into both eyes 2 (two) times daily.     escitalopram (LEXAPRO) 5 MG tablet TAKE 1 TABLET (5 MG TOTAL) BY MOUTH DAILY. 90 tablet 1   estrogen, conjugated,-medroxyprogesterone (PREMPRO) 0.45-1.5 MG tablet Take 1 tablet by mouth daily. 84 tablet 4   fish oil-omega-3 fatty acids 1000 MG capsule Take one capsule by mouth  daily     losartan (COZAAR) 50 MG tablet Take 1 tablet (50 mg total) by mouth daily. 90 tablet 3   No current facility-administered medications for this visit.     Objective:  BP (!) 154/72   Pulse 61   Temp 97.7 F (36.5 C) (Temporal)   Ht '5\' 2"'$  (1.575 m)   Wt 107 lb 12.8 oz (48.9 kg)   LMP  (LMP Unknown)   SpO2 98%   BMI 19.72 kg/m  Gen: NAD, resting comfortably CV: irregularly irregular  no murmurs rubs or gallops Lungs: CTAB no crackles, wheeze, rhonchi Ext:  trace edema under compression stockings  Skin: warm, dry     Assessment and Plan   #Social update-. Moved into [place] at [facility] January,2021. Husband with PSP and this had been really hard on her  # Caregiver burden/stress S: she was getting burnt out and her focus is on him most of the time. She helped in morning and evenings.  -she was also more limited in mobility compared to when she lived in her home. Tried to get to exercise if she can but he often had visits or scheduled classes.   -with caregiver burden-we opted to trial low-dose of Lexapro 5 mg in the morning-I asked her to follow-up with me in 6 weeks. She has not noted significant side effects other than fatigue in  first few days. Fewer PVCs with this -daughter states less obsessive worry- still worries but more natural  Still low appetite- feels dropping weight at home- similar on home scales today A/P:  caregiver burden/anxiety improving with GAD7 down from 7 to 3 and now down to no difficulty. BP also slightly higher- possibly related to lexapro - see discussion below. We are going to monitor at home and also see how she does stress wise - if BP ok at home and stress increases could try higher dose.  -daughter wants her to consider 10 mg, we also discussed possible 7.5 mg  # Balance issues S:gait and balance training at [place] helping some with balance. Working on weaker hips.   A/P: balance issues improving- continue with PT   #hypertension S: medication: coreg 3.125 mg BID with extra 3.125 mg in AM, losartan 50 mg Home readings #s: has cuff but not recently checked BP Readings from Last 3 Encounters:  07/22/21 (!) 154/72  06/10/21 120/67  01/13/21 130/61  A/P: poor control- will do some home monitoring before making adjustments- possible lexapro raising BP but has helped her so will hold off on changing meds for now   # mobility concern- had one fall in last 6 months. Family stated smaller steps. Sometimes felt dizzy if turned to quickly- should have referred to PT.   #Paroxysmal atrial fibrillation/palpitations-followed with Dr. Johnsie Cancel- no obvious recurrence after dental procedure. She is on aspirin alone.  She was on carvedilol for blood pressure which offered some rate control if needed but is primarily used for blood pressure -appropriately rate controlled. Continue aspirin alone and stay off anticoagulation- cardiology agrees. Glad palpitations better on lexapro.    #some sinus congestion and occasional cough - cant take antihistamines with sjogrens. Did not d well with nasal sprays. Discussed sinus rinse options- she has purnchased one but not tried yet  Recommended follow up: Return  in about 3 months (around 10/22/2021) for follow up- or sooner if needed. Future Appointments  Date Time Provider [place]  07/29/2021  2:00 PM Ofilia Neas, PA-C [facility] None  10/21/2021 11:00 AM Princess Bruins, MD [facility] None  11/05/2021  1:45 PM [facility] HEALTH COACH [facility]   Lab/Order associations:   ICD-10-CM   1. Essential hypertension  I10     2. Caregiver burden  Z63.6     3. Anxiety  F41.9     4. Paroxysmal atrial fibrillation (HCC)  I48.0      I,Jada Bradford,acting as a scribe for Garret Reddish, MD.,have documented all relevant documentation on the behalf of Garret Reddish, MD,as directed by  Garret Reddish, MD while in the presence of Garret Reddish, MD.  I, Garret Reddish, MD, have reviewed  all documentation for this visit. The documentation on 07/22/21 for the exam, diagnosis, procedures, and orders are all accurate and complete.  Return precautions advised.  Garret Reddish, MD

## 2021-07-21 DIAGNOSIS — M6281 Muscle weakness (generalized): Secondary | ICD-10-CM | POA: Diagnosis not present

## 2021-07-21 DIAGNOSIS — R2681 Unsteadiness on feet: Secondary | ICD-10-CM | POA: Diagnosis not present

## 2021-07-21 DIAGNOSIS — R2689 Other abnormalities of gait and mobility: Secondary | ICD-10-CM | POA: Diagnosis not present

## 2021-07-22 ENCOUNTER — Encounter: Payer: Self-pay | Admitting: Family Medicine

## 2021-07-22 ENCOUNTER — Other Ambulatory Visit: Payer: Self-pay

## 2021-07-22 ENCOUNTER — Ambulatory Visit (INDEPENDENT_AMBULATORY_CARE_PROVIDER_SITE_OTHER): Payer: Medicare Other | Admitting: Family Medicine

## 2021-07-22 VITALS — BP 154/72 | HR 61 | Temp 97.7°F | Ht 62.0 in | Wt 107.8 lb

## 2021-07-22 DIAGNOSIS — F419 Anxiety disorder, unspecified: Secondary | ICD-10-CM | POA: Diagnosis not present

## 2021-07-22 DIAGNOSIS — Z636 Dependent relative needing care at home: Secondary | ICD-10-CM

## 2021-07-22 DIAGNOSIS — N183 Chronic kidney disease, stage 3 unspecified: Secondary | ICD-10-CM

## 2021-07-22 DIAGNOSIS — I10 Essential (primary) hypertension: Secondary | ICD-10-CM

## 2021-07-22 DIAGNOSIS — I480 Paroxysmal atrial fibrillation: Secondary | ICD-10-CM

## 2021-07-22 NOTE — Patient Instructions (Addendum)
Health Maintenance Due  Topic Date Due   Zoster Vaccines- Shingrix (1 of 2)   -Please check with your pharmacy to see if they have the shingrix vaccine. If they do- please get this immunization and update [place] by phone call or mychart with dates you receive the vaccine   Never done   INFLUENZA VACCINE  - Please consider getting your flu shot in the Fall. If you get this outside of our office, please let us know.  06/29/2021   Please update me in one week on recent blood pressure readings at home. Goal <135/85 at home.  For now continue 5 mg but we can consider higher dose if needed for increased stress.  Recommended follow up: Return in about 3 months (around 10/22/2021) for follow up- or sooner if needed.

## 2021-07-23 DIAGNOSIS — R2681 Unsteadiness on feet: Secondary | ICD-10-CM | POA: Diagnosis not present

## 2021-07-23 DIAGNOSIS — M6281 Muscle weakness (generalized): Secondary | ICD-10-CM | POA: Diagnosis not present

## 2021-07-23 DIAGNOSIS — R2689 Other abnormalities of gait and mobility: Secondary | ICD-10-CM | POA: Diagnosis not present

## 2021-07-27 DIAGNOSIS — Z20828 Contact with and (suspected) exposure to other viral communicable diseases: Secondary | ICD-10-CM | POA: Diagnosis not present

## 2021-07-28 DIAGNOSIS — M6281 Muscle weakness (generalized): Secondary | ICD-10-CM | POA: Diagnosis not present

## 2021-07-28 DIAGNOSIS — R2689 Other abnormalities of gait and mobility: Secondary | ICD-10-CM | POA: Diagnosis not present

## 2021-07-28 DIAGNOSIS — R2681 Unsteadiness on feet: Secondary | ICD-10-CM | POA: Diagnosis not present

## 2021-07-29 ENCOUNTER — Encounter: Payer: Self-pay | Admitting: Physician Assistant

## 2021-07-29 ENCOUNTER — Other Ambulatory Visit: Payer: Self-pay

## 2021-07-29 ENCOUNTER — Ambulatory Visit (INDEPENDENT_AMBULATORY_CARE_PROVIDER_SITE_OTHER): Payer: Medicare Other | Admitting: Physician Assistant

## 2021-07-29 VITALS — BP 144/81 | HR 76 | Ht 62.0 in | Wt 109.0 lb

## 2021-07-29 DIAGNOSIS — M7712 Lateral epicondylitis, left elbow: Secondary | ICD-10-CM

## 2021-07-29 DIAGNOSIS — Z8719 Personal history of other diseases of the digestive system: Secondary | ICD-10-CM | POA: Diagnosis not present

## 2021-07-29 DIAGNOSIS — I7300 Raynaud's syndrome without gangrene: Secondary | ICD-10-CM | POA: Diagnosis not present

## 2021-07-29 DIAGNOSIS — Z862 Personal history of diseases of the blood and blood-forming organs and certain disorders involving the immune mechanism: Secondary | ICD-10-CM

## 2021-07-29 DIAGNOSIS — M8589 Other specified disorders of bone density and structure, multiple sites: Secondary | ICD-10-CM

## 2021-07-29 DIAGNOSIS — M19041 Primary osteoarthritis, right hand: Secondary | ICD-10-CM | POA: Diagnosis not present

## 2021-07-29 DIAGNOSIS — M545 Low back pain, unspecified: Secondary | ICD-10-CM | POA: Diagnosis not present

## 2021-07-29 DIAGNOSIS — R2689 Other abnormalities of gait and mobility: Secondary | ICD-10-CM | POA: Diagnosis not present

## 2021-07-29 DIAGNOSIS — G8929 Other chronic pain: Secondary | ICD-10-CM

## 2021-07-29 DIAGNOSIS — Z8679 Personal history of other diseases of the circulatory system: Secondary | ICD-10-CM | POA: Diagnosis not present

## 2021-07-29 DIAGNOSIS — M19072 Primary osteoarthritis, left ankle and foot: Secondary | ICD-10-CM

## 2021-07-29 DIAGNOSIS — M19071 Primary osteoarthritis, right ankle and foot: Secondary | ICD-10-CM | POA: Diagnosis not present

## 2021-07-29 DIAGNOSIS — M19042 Primary osteoarthritis, left hand: Secondary | ICD-10-CM

## 2021-07-29 DIAGNOSIS — R5383 Other fatigue: Secondary | ICD-10-CM | POA: Diagnosis not present

## 2021-07-29 DIAGNOSIS — M3501 Sicca syndrome with keratoconjunctivitis: Secondary | ICD-10-CM | POA: Diagnosis not present

## 2021-07-29 NOTE — Patient Instructions (Signed)
Hand Exercises Hand exercises can be helpful for almost anyone. These exercises can strengthen the hands, improve flexibility and movement, and increase blood flow to the hands. These results can make work and daily tasks easier. Hand exercises can be especially helpful for people who have joint pain from arthritis or have nerve damage from overuse (carpal tunnel syndrome). These exercises can also help people who have injured a hand. Exercises Most of these hand exercises are gentle stretching and motion exercises. It is usually safe to do them often throughout the day. Warming up your hands before exercise may help to reduce stiffness. You can do this with gentle massage or by placing your hands in warm water for 10-15 minutes. It is normal to feel some stretching, pulling, tightness, or mild discomfort as you begin new exercises. This will gradually improve. Stop an exercise right away if you feel sudden, severe pain or your pain gets worse. Ask your health care provider which exercises are best for you. Knuckle bend or "claw" fist  Stand or sit with your arm, hand, and all five fingers pointed straight up. Make sure to keep your wrist straight during the exercise. Gently bend your fingers down toward your palm until the tips of your fingers are touching the top of your palm. Keep your big knuckle straight and just bend the small knuckles in your fingers. Hold this position for __________ seconds. Straighten (extend) your fingers back to the starting position. Repeat this exercise 5-10 times with each hand. Full finger fist  Stand or sit with your arm, hand, and all five fingers pointed straight up. Make sure to keep your wrist straight during the exercise. Gently bend your fingers into your palm until the tips of your fingers are touching the middle of your palm. Hold this position for __________ seconds. Extend your fingers back to the starting position, stretching every joint fully. Repeat  this exercise 5-10 times with each hand. Straight fist Stand or sit with your arm, hand, and all five fingers pointed straight up. Make sure to keep your wrist straight during the exercise. Gently bend your fingers at the big knuckle, where your fingers meet your hand, and the middle knuckle. Keep the knuckle at the tips of your fingers straight and try to touch the bottom of your palm. Hold this position for __________ seconds. Extend your fingers back to the starting position, stretching every joint fully. Repeat this exercise 5-10 times with each hand. Tabletop  Stand or sit with your arm, hand, and all five fingers pointed straight up. Make sure to keep your wrist straight during the exercise. Gently bend your fingers at the big knuckle, where your fingers meet your hand, as far down as you can while keeping the small knuckles in your fingers straight. Think of forming a tabletop with your fingers. Hold this position for __________ seconds. Extend your fingers back to the starting position, stretching every joint fully. Repeat this exercise 5-10 times with each hand. Finger spread  Place your hand flat on a table with your palm facing down. Make sure your wrist stays straight as you do this exercise. Spread your fingers and thumb apart from each other as far as you can until you feel a gentle stretch. Hold this position for __________ seconds. Bring your fingers and thumb tight together again. Hold this position for __________ seconds. Repeat this exercise 5-10 times with each hand. Making circles  Stand or sit with your arm, hand, and all five fingers pointed   straight up. Make sure to keep your wrist straight during the exercise. Make a circle by touching the tip of your thumb to the tip of your index finger. Hold for __________ seconds. Then open your hand wide. Repeat this motion with your thumb and each finger on your hand. Repeat this exercise 5-10 times with each hand. Thumb  motion  Sit with your forearm resting on a table and your wrist straight. Your thumb should be facing up toward the ceiling. Keep your fingers relaxed as you move your thumb. Lift your thumb up as high as you can toward the ceiling. Hold for __________ seconds. Bend your thumb across your palm as far as you can, reaching the tip of your thumb for the small finger (pinkie) side of your palm. Hold for __________ seconds. Repeat this exercise 5-10 times with each hand. Grip strengthening  Hold a stress ball or other soft ball in the middle of your hand. Slowly increase the pressure, squeezing the ball as much as you can without causing pain. Think of bringing the tips of your fingers into the middle of your palm. All of your finger joints should bend when doing this exercise. Hold your squeeze for __________ seconds, then relax. Repeat this exercise 5-10 times with each hand. Contact a health care provider if: Your hand pain or discomfort gets much worse when you do an exercise. Your hand pain or discomfort does not improve within 2 hours after you exercise. If you have any of these problems, stop doing these exercises right away. Do not do them again unless your health care provider says that you can. Get help right away if: You develop sudden, severe hand pain or swelling. If this happens, stop doing these exercises right away. Do not do them again unless your health care provider says that you can. This information is not intended to replace advice given to you by your health care provider. Make sure you discuss any questions you have with your health care provider. Document Revised: 03/05/2021 Document Reviewed: 03/05/2021 Elsevier Patient Education  2022 [employer]  

## 2021-07-30 ENCOUNTER — Telehealth: Payer: Self-pay

## 2021-07-30 DIAGNOSIS — R2689 Other abnormalities of gait and mobility: Secondary | ICD-10-CM | POA: Diagnosis not present

## 2021-07-30 DIAGNOSIS — M6281 Muscle weakness (generalized): Secondary | ICD-10-CM | POA: Diagnosis not present

## 2021-07-30 DIAGNOSIS — R2681 Unsteadiness on feet: Secondary | ICD-10-CM | POA: Diagnosis not present

## 2021-07-30 NOTE — Telephone Encounter (Signed)
Please see blood pressure reading and advise.

## 2021-07-30 NOTE — Telephone Encounter (Signed)
I would like lower- is she willing to try losartan '100mg'$ ? You can send in '100mg'$  losartan daily #90 with 3 refills if she is willing

## 2021-07-30 NOTE — Telephone Encounter (Signed)
BP readings   8/25 4 PM 161/77 155/90 150/81 8/26 11 AM 149/78 165/75 138/79 8/27 6:15 PM 169/104 167/88 157/82 8/28 1:45 PM 185/98 157/90 155/84  8/29 3 PM 143/77 155/73 145/86 8/31  2 pm at [place] office 144/81 talked bout increasing to 10 mg ( escitalopram )   please advise

## 2021-07-31 MED ORDER — ESCITALOPRAM OXALATE 10 MG PO TABS: 10.0000 mg | ORAL_TABLET | Freq: Every day | ORAL | 3 refills | Status: DC

## 2021-07-31 MED ORDER — LOSARTAN POTASSIUM 100 MG PO TABS: 100.0000 mg | ORAL_TABLET | Freq: Every day | ORAL | 3 refills | Status: DC

## 2021-07-31 NOTE — Telephone Encounter (Signed)
Yes you can send in escitalopram 10 mg daily #90 with 3 refills

## 2021-07-31 NOTE — Telephone Encounter (Signed)
Spoke to pt told her Dr. Yong Channel would like her to increase her Losartan to 100 mg daily. He would like blood pressure lower. Pt verbalized understanding and agreed to new dose. Told her will send in new Rx to the pharmacy. Pt verbalized understanding and said she also wanted to know if okay to increase her Lexapro to 10 mg like Dr. Yong Channel and her discussed and she said she would think about it and would like to try. Told her I will send Dr. Yong Channel a message and get back to you. Pt verbalized understanding.

## 2021-07-31 NOTE — Telephone Encounter (Signed)
Dr. Yong Channel, pt said you discussed with her about increasing her Lexapro to 10 mg daily instead of 5 mg . Pt said she would like to try, okay to send new Rx?

## 2021-07-31 NOTE — Addendum Note (Signed)
Addended by: Marian Sorrow on: 07/31/2021 09:27 AM   Modules accepted: Orders

## 2021-07-31 NOTE — Addendum Note (Signed)
Addended by: Marian Sorrow on: 07/31/2021 09:06 AM   Modules accepted: Orders

## 2021-07-31 NOTE — Telephone Encounter (Signed)
Spoke to pt told her Dr. Yong Channel said okay to increase Lexapro to 10 mg daily and I have sent a new Rx in for you. Pt verbalized understanding.

## 2021-08-03 DIAGNOSIS — Z8616 Personal history of COVID-19: Secondary | ICD-10-CM | POA: Diagnosis not present

## 2021-08-06 DIAGNOSIS — R2689 Other abnormalities of gait and mobility: Secondary | ICD-10-CM | POA: Diagnosis not present

## 2021-08-06 DIAGNOSIS — R2681 Unsteadiness on feet: Secondary | ICD-10-CM | POA: Diagnosis not present

## 2021-08-06 DIAGNOSIS — M6281 Muscle weakness (generalized): Secondary | ICD-10-CM | POA: Diagnosis not present

## 2021-08-10 DIAGNOSIS — Z20828 Contact with and (suspected) exposure to other viral communicable diseases: Secondary | ICD-10-CM | POA: Diagnosis not present

## 2021-08-11 DIAGNOSIS — M6281 Muscle weakness (generalized): Secondary | ICD-10-CM | POA: Diagnosis not present

## 2021-08-11 DIAGNOSIS — R2681 Unsteadiness on feet: Secondary | ICD-10-CM | POA: Diagnosis not present

## 2021-08-11 DIAGNOSIS — R2689 Other abnormalities of gait and mobility: Secondary | ICD-10-CM | POA: Diagnosis not present

## 2021-08-12 ENCOUNTER — Telehealth: Payer: Self-pay

## 2021-08-12 NOTE — Telephone Encounter (Signed)
This looks pretty good overall-continue current medication

## 2021-08-12 NOTE — Telephone Encounter (Signed)
See below

## 2021-08-12 NOTE — Telephone Encounter (Signed)
error 

## 2021-08-12 NOTE — Telephone Encounter (Signed)
Called and was told pt was not home at the time, they will have pt cb when she returns home.

## 2021-08-12 NOTE — Telephone Encounter (Signed)
Patient called with her BP readings for 08/02/21 -08/06/21   08/02/21 at 11:30 am - 154/77 - 148/74 - 108/72  08/03/21 at 11:50am - 106-74 - 103/75 - 107-70  then at 3:45pm -107/72 - 102/72 - 105-72   08/04/21 at 11am - 108/63 - 118/74  - 132/78  - 108/66  08/05/21 at 10:28am - 123/79 - 110/75 - 107/68  08/06/21 at 3pm 109/72 - 141/87 - 139/73 - 119/78   Patient also wanted to let Dr. Yong Channel know that she didn't start her new anxiety medication till 08/11/21 and no side effect so far.

## 2021-08-13 DIAGNOSIS — R2689 Other abnormalities of gait and mobility: Secondary | ICD-10-CM | POA: Diagnosis not present

## 2021-08-13 DIAGNOSIS — M6281 Muscle weakness (generalized): Secondary | ICD-10-CM | POA: Diagnosis not present

## 2021-08-13 DIAGNOSIS — R2681 Unsteadiness on feet: Secondary | ICD-10-CM | POA: Diagnosis not present

## 2021-08-17 DIAGNOSIS — Z8616 Personal history of COVID-19: Secondary | ICD-10-CM | POA: Diagnosis not present

## 2021-08-17 NOTE — Telephone Encounter (Signed)
I gave pt message below. She expresses understanding.

## 2021-08-17 NOTE — Telephone Encounter (Signed)
Pt returned the call.

## 2021-08-18 ENCOUNTER — Telehealth: Payer: Self-pay

## 2021-08-18 NOTE — Telephone Encounter (Signed)
Patient is calling in stating her and her husband have tested positive for covid, state they are currently at [facility] living facility and wondering if there is an home care regimen that he would recommend.

## 2021-08-19 NOTE — Telephone Encounter (Signed)
Please schedule virtual visit with the pt.   Thank You!

## 2021-08-19 NOTE — Telephone Encounter (Signed)
Patient is scheduled   

## 2021-08-20 ENCOUNTER — Encounter: Payer: Self-pay | Admitting: Family Medicine

## 2021-08-20 ENCOUNTER — Telehealth (INDEPENDENT_AMBULATORY_CARE_PROVIDER_SITE_OTHER): Payer: Medicare Other | Admitting: Family Medicine

## 2021-08-20 VITALS — Temp 98.2°F

## 2021-08-20 DIAGNOSIS — U071 COVID-19: Secondary | ICD-10-CM | POA: Diagnosis not present

## 2021-08-20 MED ORDER — MOLNUPIRAVIR EUA 200MG CAPSULE: 4.0000 | ORAL_CAPSULE | Freq: Two times a day (BID) | ORAL | 0 refills | Status: AC

## 2021-08-20 NOTE — Progress Notes (Signed)
Virtual Visit via Video Note  I connected with Bailey [name]  on 08/20/21 at 12:20 PM EDT by a video enabled telemedicine application and verified that I am speaking with the correct person using two identifiers.  Location patient: home, [place] Location provider:work or home office Persons participating in the virtual visit: patient, provider  I discussed the limitations of evaluation and management by telemedicine and the availability of in person appointments. The patient expressed understanding and agreed to proceed.   HPI:  Acute telemedicine visit for Covid19: -Onset: about 2-4 days ago -Symptoms include: nasal congestion, cough -Denies:fever, CP, SOB, NVD, inability/eat/get out of bed -Pertinent past medical history: see below -Pertinent medication allergies:  Allergies  Allergen Reactions   Diamox [Acetazolamide] Nausea Only   Epinephrine-Lidocaine-Na Metabisulfite [Lidocaine-Epinephrine] Other (See Comments)    Atrial fibrillation   Erythromycin Nausea Only   Penicillins Other (See Comments)    Unsure of reaction   Sulfonamide Derivatives Nausea Only and Other (See Comments)    Causes fever, kidney problems   Tape Rash    Sensitive to skin  -COVID-19 vaccine status:vaccinated x2 and 2 boosters -GFR 52 in July  ROS: See pertinent positives and negatives per HPI.  Past Medical History:  Diagnosis Date   Allergy, unspecified not elsewhere classified    Anemia, unspecified    Benign neoplasm of colon    Cancer ([place])    basal/sqamous/transitional melanoma   CKD (chronic kidney disease), stage III ([place]) 12/08/2017   COLONIC POLYPS 09/04/2008   Was told by Gi does not need anymore.     Diverticulosis of colon (without mention of hemorrhage)    Dry eye syndrome    Dysrhythmia    PVCs   Esophageal reflux    Hepatitis 1950s   infectious from food   History of atrial fibrillation    Hypertension    Irritable bowel syndrome    Mitral valve disorders(424.0)    Moderate aortic  regurgitation 12/08/2017   Osteopenia 06/2018   T score -1.8 FRAX 10% / 2.5%   Palpitations    Premature ventricular contractions    Rheumatoid arthritis(714.0)    Sicca syndrome ([place])    Ulcer 1992   Vaginal delivery 1959, 1960, 1962, 1964    Past Surgical History:  Procedure Laterality Date   APPENDECTOMY     CATARACT EXTRACTION     CHOLECYSTECTOMY     colon tumor  1969   Benign rectal tumor excised   DILATATION & CURETTAGE/HYSTEROSCOPY WITH MYOSURE N/A 01/21/2015   Procedure: [place];  Surgeon: [name], MD;  Location: [facility];  Service: Gynecology;  Laterality: N/A;   HIATAL HERNIA REPAIR     REFRACTIVE SURGERY     ROTATOR CUFF REPAIR  2009   right by Dr. French Ana   SKIN BIOPSY  11/2020   squamous cell removed  2013   Dr. Sherrye Payor --removed from her right cheek   TUBAL LIGATION       Current Outpatient Medications:    aspirin 81 MG tablet, Take 81 mg by mouth daily., Disp: , Rfl:    carvedilol (COREG) 3.125 MG tablet, TAKE 2 TABLETS BY MOUTH IN THE AM AND 1 TABLET BY MOUTH IN THE EVENING., Disp: 270 tablet, Rfl: 3   Cholecalciferol (VITAMIN D) 400 UNITS capsule, Take 2 capsules by mouth daily, Disp: , Rfl:    cycloSPORINE (RESTASIS) 0.05 % ophthalmic emulsion, Place 1 drop into both eyes 2 (two) times daily., Disp: , Rfl:  escitalopram (LEXAPRO) 10 MG tablet, Take 1 tablet (10 mg total) by mouth daily., Disp: 90 tablet, Rfl: 3   estrogen, conjugated,-medroxyprogesterone (PREMPRO) 0.45-1.5 MG tablet, Take 1 tablet by mouth daily., Disp: 84 tablet, Rfl: 4   fish oil-omega-3 fatty acids 1000 MG capsule, Take one capsule by mouth  daily, Disp: , Rfl:    losartan (COZAAR) 100 MG tablet, Take 1 tablet (100 mg total) by mouth daily., Disp: 90 tablet, Rfl: 3   molnupiravir EUA (LAGEVRIO) 200 mg CAPS capsule, Take 4 capsules (800 mg total) by mouth 2 (two) times daily for 5 days., Disp: 40 capsule, Rfl: 0  EXAM:  VITALS per  patient if applicable:  GENERAL: alert, oriented, appears well and in no acute distress  HEENT: atraumatic, conjunttiva clear, no obvious abnormalities on inspection of external nose and ears  NECK: normal movements of the head and neck  LUNGS: on inspection no signs of respiratory distress, breathing rate appears normal, no obvious gross SOB, gasping or wheezing  CV: no obvious cyanosis  MS: moves all visible extremities without noticeable abnormality  PSYCH/NEURO: pleasant and cooperative, no obvious depression or anxiety, speech and thought processing grossly intact  ASSESSMENT AND PLAN:  Discussed the following assessment and plan:  COVID-19   Discussed treatment options (infusions and oral options and risk of drug interactions), ideal treatment window, potential complications, isolation and precautions for COVID-19.  Discussed possibility of rebound with antivirals and the need to reisolate if it should occur for 5 days. Checked for/reviewed any labs done in the last 90 days with GFR listed in HPI if available. After lengthy discussion, the patient opted for treatment with molnupiravir due to being higher risk for complications of covid or severe disease and other factors. Discussed EUA status of this drug and the fact that there is preliminary limited knowledge of risks/interactions/side effects per EUA document vs possible benefits and precautions. This information was shared with patient during the visit and also was provided in patient instructions. Also, advised that patient discuss risks/interactions and use with pharmacist/treatment team as well. Other symptomatic care measures summarized in patient instructions.  Advised to seek prompt in person care if worsening, new symptoms arise, or if is not improving with treatment. Discussed options for inperson care if PCP office not available. Did let this patient know that I only do telemedicine on Tuesdays and Thursdays for [place].  Advised to schedule follow up visit with PCP or [facility] if any further questions or concerns to avoid delays in care.   I discussed the assessment and treatment plan with the patient. The patient was provided an opportunity to ask questions and all were answered. The patient agreed with the plan and demonstrated an understanding of the instructions.     Lucretia Kern, DO

## 2021-08-20 NOTE — Patient Instructions (Addendum)
HOME CARE TIPS:   -I sent the medication(s) we discussed to your pharmacy: Meds ordered this encounter  Medications   molnupiravir EUA (LAGEVRIO) 200 mg CAPS capsule    Sig: Take 4 capsules (800 mg total) by mouth 2 (two) times daily for 5 days.    Dispense:  40 capsule    Refill:  0     -I sent in the [place] treatment or referral you requested per our discussion. Please see the information provided below and discuss further with the pharmacist/treatment team.   -there is a chance of rebound illness after finishing your treatment. If you become sick again please isolate for an additional 5 days.    -can use tylenol if needed for fevers, aches and pains per instructions  -can use nasal saline a few times per day if you have nasal congestion  -stay hydrated, drink plenty of fluids and eat small healthy meals - avoid dairy  -follow up with your doctor in 2-3 days unless improving and feeling better  -stay home while sick, except to seek medical care. If you have COVID19, ideally it would be best to stay home for a full 10 days since the onset of symptoms PLUS one day of no fever and feeling better. Wear a good mask that fits snugly (such as N95 or KN95) if around others to reduce the risk of transmission.  It was nice to meet you today, and I really hope you are feeling better soon. I help [facility] out with telemedicine visits on Tuesdays and Thursdays and am available for visits on those days. If you have any concerns or questions following this visit please schedule a follow up visit with your Primary Care doctor or seek care at a local urgent care clinic to avoid delays in care.    Seek in person care or schedule a follow up video visit promptly if your symptoms worsen, new concerns arise or you are not improving with treatment. Call 911 and/or seek emergency care if your symptoms are severe or life threatening.    Fact Sheet for Patients And Caregivers Emergency Use  Authorization (EUA) Of LAGEVRIOT (molnupiravir) capsules For Coronavirus Disease 2019 (COVID-19)  What is the most important information I should know about LAGEVRIO? LAGEVRIO may cause serious side effects, including: ? LAGEVRIO may cause harm to your unborn baby. It is not known if LAGEVRIO will harm your baby if you take LAGEVRIO during pregnancy. o LAGEVRIO is not recommended for use in pregnancy. o LAGEVRIO has not been studied in pregnancy. LAGEVRIO was studied in pregnant animals only. When LAGEVRIO was given to pregnant animals, LAGEVRIO caused harm to their unborn babies. o You and your healthcare provider may decide that you should take LAGEVRIO during pregnancy if there are no other COVID-19 treatment options approved or authorized by the FDA that are accessible or clinically appropriate for you. o If you and your healthcare provider decide that you should take LAGEVRIO during pregnancy, you and your healthcare provider should discuss the known and potential benefits and the potential risks of taking LAGEVRIO during pregnancy. For individuals who are able to become pregnant: ? You should use a reliable method of birth control (contraception) consistently and correctly during treatment with LAGEVRIO and for 4 days after the last dose of LAGEVRIO. Talk to your healthcare provider about reliable birth control methods. ? Before starting treatment with [facility] your healthcare provider may do a pregnancy test to see if you are pregnant before starting treatment with LAGEVRIO. ?  Tell your healthcare provider right away if you become pregnant or think you may be pregnant during treatment with LAGEVRIO. Pregnancy Surveillance Program: ? There is a pregnancy surveillance program for individuals who take LAGEVRIO during pregnancy. The purpose of this program is to collect information about the health of you and your baby. Talk to your healthcare provider about how to take part in this  program. ? If you take LAGEVRIO during pregnancy and you agree to participate in the pregnancy surveillance program and allow your healthcare provider to share your information with [place], then your healthcare provider will report your use of [place] during pregnancy to [place]. by calling [phone] or [email or web address]. For individuals who are sexually active with partners who are able to become pregnant: ? It is not known if LAGEVRIO can affect sperm. While the risk is regarded as low, animal studies to fully assess the potential for LAGEVRIO to affect the babies of males treated with LAGEVRIO have not been completed. A reliable method of birth control (contraception) should be used consistently and correctly during treatment with LAGEVRIO and for at least 3 months after the last dose. The risk to sperm beyond 3 months is not known. Studies to understand the risk to sperm beyond 3 months are ongoing. Talk to your healthcare provider about reliable birth control methods. Talk to your healthcare provider if you have questions or concerns about how LAGEVRIO may affect sperm. You are being given this fact sheet because your healthcare provider believes it is necessary to provide you with LAGEVRIO for the treatment of adults with mild-to-moderate coronavirus disease 2019 (COVID-19) with positive results of direct SARS-CoV-2 viral testing, and who are at high risk for progression to severe COVID-19 including hospitalization or death, and for whom other COVID-19 treatment options approved or authorized by the FDA are not accessible or clinically appropriate. The U.S. Food and Drug Administration (FDA) has issued an Emergency Use Authorization (EUA) to make LAGEVRIO available during the COVID-19 pandemic (for more details about an EUA please see "What is an Emergency Use Authorization?" at the end of this document). LAGEVRIO is not an FDA-approved  medicine in the [place]. Read this Fact Sheet for information about LAGEVRIO. Talk to your healthcare provider about your options if you have any questions. It is your choice to take LAGEVRIO.  What is COVID-19? COVID-19 is caused by a virus called a coronavirus. You can get COVID-19 through close contact with another person who has the virus. COVID-19 illnesses have ranged from very mild-to-severe, including illness resulting in death. While information so far suggests that most COVID-19 illness is mild, serious illness can happen and may cause some of your other medical conditions to become worse. Older people and people of all ages with severe, long lasting (chronic) medical conditions like heart disease, lung disease and diabetes, for example seem to be at higher risk of being hospitalized for COVID-19.  What is LAGEVRIO? LAGEVRIO is an investigational medicine used to treat mild-to-moderate COVID-19 in adults: ? with positive results of direct SARS-CoV-2 viral testing, and ? who are at high risk for progression to severe COVID-19 including hospitalization or death, and for whom other COVID-19 treatment options approved or authorized by the FDA are not accessible or clinically appropriate. The FDA has authorized the emergency use of LAGEVRIO for the treatment of mild-tomoderate COVID-19 in adults under an EUA. For more information on EUA, see the "What is an Emergency Use Authorization (EUA)?" section  at the end of this Fact Sheet. LAGEVRIO is not authorized: ? for use in people less than 29 years of age. ? for prevention of COVID-19. ? for people needing hospitalization for COVID-19. ? for use for longer than 5 consecutive days.  What should I tell my healthcare provider before I take LAGEVRIO? Tell your healthcare provider if you: ? Have any allergies ? Are breastfeeding or plan to breastfeed ? Have any serious illnesses ? Are taking any medicines (prescription,  over-the-counter, vitamins, or herbal products).  How do I take LAGEVRIO? ? Take LAGEVRIO exactly as your healthcare provider tells you to take it. ? Take 4 capsules of LAGEVRIO every 12 hours (for example, at 8 am and at 8 pm) ? Take LAGEVRIO for 5 days. It is important that you complete the full 5 days of treatment with LAGEVRIO. Do not stop taking LAGEVRIO before you complete the full 5 days of treatment, even if you feel better. ? Take LAGEVRIO with or without food. ? You should stay in isolation for as long as your healthcare provider tells you to. Talk to your healthcare provider if you are not sure about how to properly isolate while you have COVID-19. ? Swallow LAGEVRIO capsules whole. Do not open, break, or crush the capsules. If you cannot swallow capsules whole, tell your healthcare provider. ? What to do if you miss a dose: o If it has been less than 10 hours since the missed dose, take it as soon as you remember o If it has been more than 10 hours since the missed dose, skip the missed dose and take your dose at the next scheduled time. ? Do not double the dose of LAGEVRIO to make up for a missed dose.  What are the important possible side effects of LAGEVRIO? ? See, "What is the most important information I should know about LAGEVRIO?" ? Allergic Reactions. Allergic reactions can happen in people taking LAGEVRIO, even after only 1 dose. Stop taking LAGEVRIO and call your healthcare provider right away if you get any of the following symptoms of an allergic reaction: o hives o rapid heartbeat o trouble swallowing or breathing o swelling of the mouth, lips, or face o throat tightness o hoarseness o skin rash The most common side effects of LAGEVRIO are: ? diarrhea ? nausea ? dizziness These are not all the possible side effects of LAGEVRIO. Not many people have taken LAGEVRIO. Serious and unexpected side effects may happen. This medicine is still being studied, so  it is possible that all of the risks are not known at this time.  What other treatment choices are there?  Veklury (remdesivir) is FDA-approved as an intravenous (IV) infusion for the treatment of mildto-moderate [id number]-53 in certain adults and children. Talk with your doctor to see if Marijean Heath is appropriate for you. Like LAGEVRIO, FDA may also allow for the emergency use of other medicines to treat people with COVID-19. Go to LacrosseProperties.si for more information. It is your choice to be treated or not to be treated with LAGEVRIO. Should you decide not to take it, it will not change your standard medical care.  What if I am breastfeeding? Breastfeeding is not recommended during treatment with LAGEVRIO and for 4 days after the last dose of LAGEVRIO. If you are breastfeeding or plan to breastfeed, talk to your healthcare provider about your options and specific situation before taking LAGEVRIO.  How do I report side effects with LAGEVRIO? Contact your healthcare provider if you  have any side effects that bother you or do not go away. Report side effects to FDA MedWatch at SmoothHits.hu or call 1-800-FDA-1088 (1- ([phone]).  How should I store [place]? ? Store LAGEVRIO capsules at room temperature between 7F to 62F (20C to 25C). ? Keep LAGEVRIO and all medicines out of the reach of children and pets. How can I learn more about COVID-19? ? Ask your healthcare provider. ? Visit SeekRooms.co.uk ? Contact your local or state public health department. ? Call [place] at [phone] (toll free in the [place] ? Visit [email or web address].com  What Is an Emergency Use Authorization (EUA)? The [place] FDA has made [place] available under an emergency access mechanism called an Emergency Use Authorization (EUA) The EUA is supported by a Presenter, broadcasting  Health and Human Service (HHS) declaration that circumstances exist to justify emergency use of drugs and biological products during the COVID-19 pandemic. LAGEVRIO for the treatment of mild-to-moderate COVID-19 in adults with positive results of direct SARS-CoV-2 viral testing, who are at high risk for progression to severe COVID-19, including hospitalization or death, and for whom alternative COVID-19 treatment options approved or authorized by FDA are not accessible or clinically appropriate, has not undergone the same type of review as an FDA-approved product. In issuing an EUA under the BRKVT-55 public health emergency, the FDA has determined, among other things, that based on the total amount of scientific evidence available including data from adequate and well-controlled clinical trials, if available, it is reasonable to believe that the product may be effective for diagnosing, treating, or preventing COVID-19, or a serious or life-threatening disease or condition caused by COVID-19; that the known and potential benefits of the product, when used to diagnose, treat, or prevent such disease or condition, outweigh the known and potential risks of such product; and that there are no adequate, approved, and available alternatives.  All of these criteria must be met to allow for the product to be used in the treatment of patients during the COVID-19 pandemic. The EUA for LAGEVRIO is in effect for the duration of the COVID-19 declaration justifying emergency use of LAGEVRIO, unless terminated or revoked (after which LAGEVRIO may no longer be used under the EUA). For patent information: http://rogers.info/ Copyright  2021-2022 [employer] [place], [place] and its affiliates. All rights reserved. [id number] Revised: March 2022

## 2021-08-24 DIAGNOSIS — Z8616 Personal history of COVID-19: Secondary | ICD-10-CM | POA: Diagnosis not present

## 2021-08-25 DIAGNOSIS — M6281 Muscle weakness (generalized): Secondary | ICD-10-CM | POA: Diagnosis not present

## 2021-08-25 DIAGNOSIS — R2689 Other abnormalities of gait and mobility: Secondary | ICD-10-CM | POA: Diagnosis not present

## 2021-08-25 DIAGNOSIS — R2681 Unsteadiness on feet: Secondary | ICD-10-CM | POA: Diagnosis not present

## 2021-08-31 DIAGNOSIS — Z8616 Personal history of COVID-19: Secondary | ICD-10-CM | POA: Diagnosis not present

## 2021-09-01 DIAGNOSIS — R2689 Other abnormalities of gait and mobility: Secondary | ICD-10-CM | POA: Diagnosis not present

## 2021-09-01 DIAGNOSIS — R2681 Unsteadiness on feet: Secondary | ICD-10-CM | POA: Diagnosis not present

## 2021-09-01 DIAGNOSIS — M6281 Muscle weakness (generalized): Secondary | ICD-10-CM | POA: Diagnosis not present

## 2021-09-02 DIAGNOSIS — Z1231 Encounter for screening mammogram for malignant neoplasm of breast: Secondary | ICD-10-CM | POA: Diagnosis not present

## 2021-09-02 LAB — HM MAMMOGRAPHY

## 2021-09-03 ENCOUNTER — Encounter: Payer: Self-pay | Admitting: Nurse Practitioner

## 2021-09-03 DIAGNOSIS — M6281 Muscle weakness (generalized): Secondary | ICD-10-CM | POA: Diagnosis not present

## 2021-09-03 DIAGNOSIS — R2681 Unsteadiness on feet: Secondary | ICD-10-CM | POA: Diagnosis not present

## 2021-09-03 DIAGNOSIS — R2689 Other abnormalities of gait and mobility: Secondary | ICD-10-CM | POA: Diagnosis not present

## 2021-09-07 DIAGNOSIS — Z8616 Personal history of COVID-19: Secondary | ICD-10-CM | POA: Diagnosis not present

## 2021-09-08 DIAGNOSIS — R2681 Unsteadiness on feet: Secondary | ICD-10-CM | POA: Diagnosis not present

## 2021-09-08 DIAGNOSIS — M6281 Muscle weakness (generalized): Secondary | ICD-10-CM | POA: Diagnosis not present

## 2021-09-08 DIAGNOSIS — R2689 Other abnormalities of gait and mobility: Secondary | ICD-10-CM | POA: Diagnosis not present

## 2021-09-10 DIAGNOSIS — R2681 Unsteadiness on feet: Secondary | ICD-10-CM | POA: Diagnosis not present

## 2021-09-10 DIAGNOSIS — M6281 Muscle weakness (generalized): Secondary | ICD-10-CM | POA: Diagnosis not present

## 2021-09-10 DIAGNOSIS — R2689 Other abnormalities of gait and mobility: Secondary | ICD-10-CM | POA: Diagnosis not present

## 2021-09-11 ENCOUNTER — Telehealth: Payer: Self-pay | Admitting: Cardiovascular Disease

## 2021-09-11 DIAGNOSIS — I351 Nonrheumatic aortic (valve) insufficiency: Secondary | ICD-10-CM

## 2021-09-11 NOTE — Telephone Encounter (Signed)
Pt would like to have an order for a Echo ordered before she makes her yearly f/u with Dr. Johnsie Cancel. Pt would like a callback regarding the Echo order

## 2021-09-11 NOTE — Telephone Encounter (Signed)
Left message for patient to call back.  Patient had an echo done 11/2020, per Dr. Kyla Balzarine result note, will repeat echo in 1 year. Patient is due for yearly visit in 12/2020. When patient calls back will try to schedule office visit and ehco on the same day in February.

## 2021-09-11 NOTE — Telephone Encounter (Signed)
Patient called back. Scheduled her for OV. Sent call to echo scheduler to help with scheduling echo before OV. Patient verbalized understanding.

## 2021-09-14 DIAGNOSIS — Z8616 Personal history of COVID-19: Secondary | ICD-10-CM | POA: Diagnosis not present

## 2021-09-15 DIAGNOSIS — R2689 Other abnormalities of gait and mobility: Secondary | ICD-10-CM | POA: Diagnosis not present

## 2021-09-15 DIAGNOSIS — R2681 Unsteadiness on feet: Secondary | ICD-10-CM | POA: Diagnosis not present

## 2021-09-15 DIAGNOSIS — M6281 Muscle weakness (generalized): Secondary | ICD-10-CM | POA: Diagnosis not present

## 2021-09-16 DIAGNOSIS — Z20828 Contact with and (suspected) exposure to other viral communicable diseases: Secondary | ICD-10-CM | POA: Diagnosis not present

## 2021-09-17 DIAGNOSIS — Z23 Encounter for immunization: Secondary | ICD-10-CM | POA: Diagnosis not present

## 2021-09-17 DIAGNOSIS — R2681 Unsteadiness on feet: Secondary | ICD-10-CM | POA: Diagnosis not present

## 2021-09-17 DIAGNOSIS — M6281 Muscle weakness (generalized): Secondary | ICD-10-CM | POA: Diagnosis not present

## 2021-09-17 DIAGNOSIS — R2689 Other abnormalities of gait and mobility: Secondary | ICD-10-CM | POA: Diagnosis not present

## 2021-09-21 ENCOUNTER — Ambulatory Visit: Payer: Medicare Other | Admitting: Obstetrics and Gynecology

## 2021-09-21 ENCOUNTER — Ambulatory Visit: Payer: Medicare Other | Admitting: Nurse Practitioner

## 2021-09-21 DIAGNOSIS — Z20828 Contact with and (suspected) exposure to other viral communicable diseases: Secondary | ICD-10-CM | POA: Diagnosis not present

## 2021-09-22 DIAGNOSIS — R2681 Unsteadiness on feet: Secondary | ICD-10-CM | POA: Diagnosis not present

## 2021-09-22 DIAGNOSIS — M6281 Muscle weakness (generalized): Secondary | ICD-10-CM | POA: Diagnosis not present

## 2021-09-22 DIAGNOSIS — R2689 Other abnormalities of gait and mobility: Secondary | ICD-10-CM | POA: Diagnosis not present

## 2021-09-23 NOTE — Progress Notes (Signed)
Phone [phone] In person visit   Subjective:   Bailey [name] is a 87 y.o. year old very pleasant female patient who presents for/with See problem oriented charting Chief Complaint  Patient presents with   Follow-up    3 month f/u HTN/anxiety.      This visit occurred during the SARS-CoV-2 public health emergency.  Safety protocols were in place, including screening questions prior to the visit, additional usage of staff PPE, and extensive cleaning of exam room while observing appropriate contact time as indicated for disinfecting solutions.   Past Medical History-  Patient Active Problem List   Diagnosis Date Noted   Sjogren's syndrome with keratoconjunctivitis sicca ([place]) 05/12/2017    Priority: High   Paroxysmal atrial fibrillation ([place]) 09/05/2008    Priority: High   CKD (chronic kidney disease), stage III ([place]) 12/08/2017    Priority: Medium    Mild aortic regurgitation 12/08/2017    Priority: Medium    HTN (hypertension) 03/13/2015    Priority: Medium    Raynauds syndrome 10/21/2014    Priority: Medium    Primary osteoarthritis of both hands 08/21/2009    Priority: Medium    PVC (premature ventricular contraction) 06/04/2009    Priority: Medium    Mitral valve prolapse 12/25/2008    Priority: Medium    Irritable bowel syndrome 09/04/2008    Priority: Medium    Premature ventricular contractions     Priority: Low   Hepatitis     Priority: Low   History of skin cancer     Priority: Low   Primary osteoarthritis of both feet 10/26/2016    Priority: Low   Anemia 03/13/2015    Priority: Low   Recurrent cold sores 10/21/2014    Priority: Low   Edema 06/29/2011    Priority: Low   Osteopenia 09/05/2008    Priority: Low   GERD 09/04/2008    Priority: Low   ALLERGY 09/04/2008    Priority: Low   COLONIC POLYPS 09/04/2008    Priority: Low   Dry eyes, bilateral 05/15/2018   Lumbar radiculopathy 12/12/2017   Allergy    Balance disorder 10/26/2016     Medications- reviewed and updated Current Outpatient Medications  Medication Sig Dispense Refill   aspirin 81 MG tablet Take 81 mg by mouth daily.     busPIRone (BUSPAR) 5 MG tablet Take 1 tablet (5 mg total) by mouth 2 (two) times daily as needed (anxiety). 60 tablet 5   carvedilol (COREG) 3.125 MG tablet TAKE 2 TABLETS BY MOUTH IN THE AM AND 1 TABLET BY MOUTH IN THE EVENING. 270 tablet 3   Cholecalciferol (VITAMIN D) 400 UNITS capsule Take 2 capsules by mouth daily     cycloSPORINE (RESTASIS) 0.05 % ophthalmic emulsion Place 1 drop into both eyes 2 (two) times daily.     escitalopram (LEXAPRO) 10 MG tablet Take 1 tablet (10 mg total) by mouth daily. 90 tablet 3   estrogen, conjugated,-medroxyprogesterone (PREMPRO) 0.45-1.5 MG tablet Take 1 tablet by mouth daily. 84 tablet 4   fish oil-omega-3 fatty acids 1000 MG capsule Take one capsule by mouth  daily     losartan (COZAAR) 100 MG tablet Take 1 tablet (100 mg total) by mouth daily. 90 tablet 3   No current facility-administered medications for this visit.     Objective:  BP 136/70   Pulse 63   Temp 98 F (36.7 C) (Temporal)   Ht 5\' 2"  (1.575 m)   Wt 107 lb 6.4  oz (48.7 kg)   LMP  (LMP Unknown)   SpO2 96%   BMI 19.64 kg/m  Gen: NAD, resting comfortably CV: RRR no murmurs rubs or gallops Lungs: CTAB no crackles, wheeze, rhonchi Ext: no edema Skin: warm, dry    Assessment and Plan   # Hx of covid - video visit with Colin Benton, DO on 08/20/21 - reported nasal congestion and cough and myalgias.  She was treated with molnupiravir and reports some lingering cough. No worsened baseline fatigue.    # Caregiver burden/stress S: Patient with caregiver burden-helping husband with PSP.  GAD-7 at last visit was down from 7-3 and listed as no difficulty last visit.  Also less obsessive worry.  Initially blood pressure was higher on Lexapro but appears to have trended down.  We did increase patient to Lexapro 10 mg dose and today she  reports has not personally noted a big difference with dosage adjustment.   -Thankfully weight stable this visit-prior weight loss A/P:  anxiety with caregiver burden continues- not much more improvement on 10 mg compared to 5 and at her age hesitant to increase dose- will trial continuing 10 mg lexapro and then try buspirone as needed 5 mg BID  - they got assistance once a week but husband has pushed back unfortunately - husband doesn't want to go to assisted living  # Balance issues S:gait and balance training at [facility] helped some with balance.  She actually had some falls previously-no recent falls.  Worked on weaker hips with PT- still working with them to help on balance - thinks improving A/P: improving with PT- encouraged to continue- doing some exercises on her own -has been getting down on her knees to help husband take shoes on and off- advised her avoiding this and use caregiver- need to avoid this due to fall risk   #hypertension S: medication: coreg 3.125 mg twice daily (more fatigue in AM with planned extra 3.125 mg in the morning of carvedilol) and losartan 50 mg daily.  Blood pressure has been higher since starting Lexapro but trended back down this visit BP Readings from Last 3 Encounters:  09/30/21 136/70  07/29/21 (!) 144/81  07/22/21 (!) 154/72  A/P: Controlled. Continue current medications.    #Paroxysmal atrial fibrillation/palpitations- S: followed with Dr. Johnsie Cancel- no obvious recurrence after dental procedure. She is on aspirin alone.  She was on carvedilol for blood pressure which offered some rate control if needed but was primarily used for blood pressure -appropriately rate controlled. Continued aspirin alone and stay off anticoagulation- cardiology agreed. Glad palpitations better on lexapro.  A/P: no obvious recurrence of a fib- palpitations/pvcs better on lexapro- continue current medications primarily coreg as above     Patient Instructions   Health  Maintenance Due  Topic Date Due   Zoster Vaccines- Shingrix (1 of 2)- Please check with your pharmacy to see if they have the shingrix vaccine. If they do- please get this immunization and update [place] by phone call or mychart with dates you receive the vaccine  Never done   COVID-19 Vaccine - wait 3 months after sept 29th before considering bivalent vaccination due to recent infection 05/25/2021   Recommended follow up: Return in about 4 months (around 01/28/2022) for follow up- or sooner if needed. Future Appointments  Date Time Provider [place]  10/21/2021 11:00 AM Princess Bruins, MD [facility] None  11/05/2021  1:45 PM [facility] HEALTH COACH [facility]  01/06/2022 10:15 AM [facility] ECHO 3 [facility]  01/11/2022  9:45 AM Josue Hector, MD [facility]  01/20/2022  1:30 PM Bo Merino, MD [facility] None   Lab/Order associations:   ICD-10-CM   1. Essential hypertension  I10     2. Caregiver burden  Z63.6     3. Stress  F43.9     4. Balance disorder  R26.89      Meds ordered this encounter  Medications   busPIRone (BUSPAR) 5 MG tablet    Sig: Take 1 tablet (5 mg total) by mouth 2 (two) times daily as needed (anxiety).    Dispense:  60 tablet    Refill:  5   I,Jada Bradford,acting as a scribe for Garret Reddish, MD.,have documented all relevant documentation on the behalf of Garret Reddish, MD,as directed by  Garret Reddish, MD while in the presence of Garret Reddish, MD.  I, Garret Reddish, MD, have reviewed all documentation for this visit. The documentation on 09/30/21 for the exam, diagnosis, procedures, and orders are all accurate and complete.  Return precautions advised.  Garret Reddish, MD

## 2021-09-24 DIAGNOSIS — R2681 Unsteadiness on feet: Secondary | ICD-10-CM | POA: Diagnosis not present

## 2021-09-24 DIAGNOSIS — R2689 Other abnormalities of gait and mobility: Secondary | ICD-10-CM | POA: Diagnosis not present

## 2021-09-24 DIAGNOSIS — M6281 Muscle weakness (generalized): Secondary | ICD-10-CM | POA: Diagnosis not present

## 2021-09-28 DIAGNOSIS — Z20828 Contact with and (suspected) exposure to other viral communicable diseases: Secondary | ICD-10-CM | POA: Diagnosis not present

## 2021-09-29 DIAGNOSIS — R2681 Unsteadiness on feet: Secondary | ICD-10-CM | POA: Diagnosis not present

## 2021-09-29 DIAGNOSIS — M6281 Muscle weakness (generalized): Secondary | ICD-10-CM | POA: Diagnosis not present

## 2021-09-29 DIAGNOSIS — R2689 Other abnormalities of gait and mobility: Secondary | ICD-10-CM | POA: Diagnosis not present

## 2021-09-30 ENCOUNTER — Encounter: Payer: Self-pay | Admitting: Family Medicine

## 2021-09-30 ENCOUNTER — Other Ambulatory Visit: Payer: Self-pay

## 2021-09-30 ENCOUNTER — Ambulatory Visit (INDEPENDENT_AMBULATORY_CARE_PROVIDER_SITE_OTHER): Payer: Medicare Other | Admitting: Family Medicine

## 2021-09-30 VITALS — BP 136/70 | HR 63 | Temp 98.0°F | Ht 62.0 in | Wt 107.4 lb

## 2021-09-30 DIAGNOSIS — R2689 Other abnormalities of gait and mobility: Secondary | ICD-10-CM | POA: Diagnosis not present

## 2021-09-30 DIAGNOSIS — F439 Reaction to severe stress, unspecified: Secondary | ICD-10-CM

## 2021-09-30 DIAGNOSIS — I10 Essential (primary) hypertension: Secondary | ICD-10-CM | POA: Diagnosis not present

## 2021-09-30 DIAGNOSIS — Z636 Dependent relative needing care at home: Secondary | ICD-10-CM

## 2021-09-30 MED ORDER — BUSPIRONE HCL 5 MG PO TABS: 5.0000 mg | ORAL_TABLET | Freq: Two times a day (BID) | ORAL | 5 refills | Status: DC | PRN

## 2021-09-30 NOTE — Patient Instructions (Addendum)
Health Maintenance Due  Topic Date Due   Zoster Vaccines- Shingrix (1 of 2)- Please check with your pharmacy to see if they have the shingrix vaccine. If they do- please get this immunization and update [place] by phone call or mychart with dates you receive the vaccine  Never done   COVID-19 Vaccine - wait 3 months after sept 29th before considering bivalent vaccination due to recent infection 05/25/2021   Due to balance issues, I recommend you avoid getting down on your knees- ideally tasks that require this would be done by a caregiver   Recommended follow up: Return in about 3 months (around 12/31/2021) for follow up- or sooner if needed.

## 2021-10-01 DIAGNOSIS — R2689 Other abnormalities of gait and mobility: Secondary | ICD-10-CM | POA: Diagnosis not present

## 2021-10-01 DIAGNOSIS — R2681 Unsteadiness on feet: Secondary | ICD-10-CM | POA: Diagnosis not present

## 2021-10-01 DIAGNOSIS — M6281 Muscle weakness (generalized): Secondary | ICD-10-CM | POA: Diagnosis not present

## 2021-10-05 ENCOUNTER — Telehealth (INDEPENDENT_AMBULATORY_CARE_PROVIDER_SITE_OTHER): Payer: Medicare Other | Admitting: Family Medicine

## 2021-10-05 ENCOUNTER — Encounter: Payer: Self-pay | Admitting: Family Medicine

## 2021-10-05 VITALS — Temp 98.2°F | Ht 62.0 in | Wt 104.0 lb

## 2021-10-05 DIAGNOSIS — R059 Cough, unspecified: Secondary | ICD-10-CM

## 2021-10-05 DIAGNOSIS — J329 Chronic sinusitis, unspecified: Secondary | ICD-10-CM

## 2021-10-05 DIAGNOSIS — R0981 Nasal congestion: Secondary | ICD-10-CM | POA: Diagnosis not present

## 2021-10-05 DIAGNOSIS — Z20822 Contact with and (suspected) exposure to covid-19: Secondary | ICD-10-CM | POA: Diagnosis not present

## 2021-10-05 DIAGNOSIS — B9689 Other specified bacterial agents as the cause of diseases classified elsewhere: Secondary | ICD-10-CM

## 2021-10-05 MED ORDER — DOXYCYCLINE HYCLATE 100 MG PO TABS: 100.0000 mg | ORAL_TABLET | Freq: Two times a day (BID) | ORAL | 0 refills | Status: AC

## 2021-10-05 NOTE — Patient Instructions (Signed)
Health Maintenance Due  Topic Date Due   Zoster Vaccines- Shingrix (1 of 2) Never done   COVID-19 Vaccine (4 - Booster for Moderna series) 05/25/2021    Recommended follow up: No follow-ups on file.

## 2021-10-05 NOTE — Progress Notes (Signed)
Phone [phone] Virtual visit via Video note   Subjective:  Chief complaint: Chief Complaint  Patient presents with   Cough    Pt states she has chest congestion and cough that has progressed since she saw you on Nov. 2 that has dark yellow phlegm with nasal drip.    This visit type was conducted due to national recommendations for restrictions regarding the COVID-19 Pandemic (e.g. social distancing).  This format is felt to be most appropriate for this patient at this time balancing risks to patient and risks to population by having him in for in person visit.  No physical exam was performed (except for noted visual exam or audio findings with Telehealth visits).    Our team/I connected with Bailey [name] at 11:20 AM EST by a video enabled telemedicine application ([email or web address] or caregility through epic) and verified that I am speaking with the correct person using two identifiers.  Location patient: [facility] Location provider: [facility], office Persons participating in the virtual visit:  patient  Our team/I discussed the limitations of evaluation and management by telemedicine and the availability of in person appointments. In light of current covid-19 pandemic, patient also understands that we are trying to protect them by minimizing in office contact if at all possible.  The patient expressed consent for telemedicine visit and agreed to proceed. Patient understands insurance will be billed.   Past Medical History-  Patient Active Problem List   Diagnosis Date Noted   Sjogren's syndrome with keratoconjunctivitis sicca ([place]) 05/12/2017    Priority: High   Paroxysmal atrial fibrillation ([place]) 09/05/2008    Priority: High   CKD (chronic kidney disease), stage III ([place]) 12/08/2017    Priority: Medium    Mild aortic regurgitation 12/08/2017    Priority: Medium    HTN (hypertension) 03/13/2015    Priority: Medium    Raynauds syndrome 10/21/2014    Priority: Medium    Primary  osteoarthritis of both hands 08/21/2009    Priority: Medium    PVC (premature ventricular contraction) 06/04/2009    Priority: Medium    Mitral valve prolapse 12/25/2008    Priority: Medium    Irritable bowel syndrome 09/04/2008    Priority: Medium    Premature ventricular contractions     Priority: Low   Hepatitis     Priority: Low   History of skin cancer     Priority: Low   Primary osteoarthritis of both feet 10/26/2016    Priority: Low   Anemia 03/13/2015    Priority: Low   Recurrent cold sores 10/21/2014    Priority: Low   Edema 06/29/2011    Priority: Low   Osteopenia 09/05/2008    Priority: Low   GERD 09/04/2008    Priority: Low   ALLERGY 09/04/2008    Priority: Low   COLONIC POLYPS 09/04/2008    Priority: Low   Dry eyes, bilateral 05/15/2018   Lumbar radiculopathy 12/12/2017   Allergy    Balance disorder 10/26/2016    Medications- reviewed and updated Current Outpatient Medications  Medication Sig Dispense Refill   aspirin 81 MG tablet Take 81 mg by mouth daily.     busPIRone (BUSPAR) 5 MG tablet Take 1 tablet (5 mg total) by mouth 2 (two) times daily as needed (anxiety). 60 tablet 5   carvedilol (COREG) 3.125 MG tablet TAKE 2 TABLETS BY MOUTH IN THE AM AND 1 TABLET BY MOUTH IN THE EVENING. 270 tablet 3   Cholecalciferol (VITAMIN D) 400 UNITS capsule Take  2 capsules by mouth daily     cycloSPORINE (RESTASIS) 0.05 % ophthalmic emulsion Place 1 drop into both eyes 2 (two) times daily.     doxycycline (VIBRA-TABS) 100 MG tablet Take 1 tablet (100 mg total) by mouth 2 (two) times daily for 7 days. 14 tablet 0   escitalopram (LEXAPRO) 10 MG tablet Take 1 tablet (10 mg total) by mouth daily. 90 tablet 3   estrogen, conjugated,-medroxyprogesterone (PREMPRO) 0.45-1.5 MG tablet Take 1 tablet by mouth daily. 84 tablet 4   fish oil-omega-3 fatty acids 1000 MG capsule Take one capsule by mouth  daily     losartan (COZAAR) 100 MG tablet Take 1 tablet (100 mg total) by mouth  daily. 90 tablet 3   No current facility-administered medications for this visit.     Objective:  Temp 98.2 F (36.8 C)   Ht 5\' 2"  (1.575 m)   Wt 104 lb (47.2 kg)   LMP  (LMP Unknown)   BMI 19.02 kg/m  self reported vitals Gen: NAD, resting comfortably Lungs: nonlabored, normal respiratory rate  Skin: appears dry, no obvious rash     Assessment and Plan   # Cough and Sinuses S: patient had some lingering cough after covid from 08/20/21 visit. Had a flu shot about a week prior to our visit on 09/30/21. Has noted progressive cough and congestion since that time. Dark yellow phlegm. Nasal drip has increased. Feeling tired/run down. Last night able to sleep some. Low appetite. Perhaps mildly winded. Symptoms were worsening up until today and have finally stabilized. Minimal sinus pressure.  A/P: Patient with at least 12 days of cough, congestion, nasal discharge concerning for potential bacterial sinusitis.  Multiple antibiotic allergies-treat with doxycycline.  Return precautions were given particularly new or worsening symptoms or persistent symptoms after a week-would consider x-ray under that circumstance  Recommended follow up: Return for as needed for new, worsening, persistent symptoms. Future Appointments  Date Time Provider [place]  10/05/2021 11:20 AM Marin Olp, MD [facility]  10/21/2021 11:00 AM Princess Bruins, MD [facility] None  11/05/2021  1:45 PM [facility] HEALTH COACH [facility]  12/30/2021 11:00 AM Marin Olp, MD [facility]  01/06/2022 10:15 AM [facility] E[facility] 3 [facility]  01/11/2022  9:45 AM Josue Hector, MD [facility]  01/20/2022  1:30 PM Deveshwar, Abel Presto, MD [facility] None   Lab/Order associations:   ICD-10-CM   1. Bacterial sinusitis  J32.9    B96.89     2. Cough, unspecified type  R05.9     3. Nasal sinus congestion  R09.81       Meds ordered this encounter  Medications   doxycycline (VIBRA-TABS) 100  MG tablet    Sig: Take 1 tablet (100 mg total) by mouth 2 (two) times daily for 7 days.    Dispense:  14 tablet    Refill:  0   I,Jada Bradford,acting as a scribe for Garret Reddish, MD.,have documented all relevant documentation on the behalf of Garret Reddish, MD,as directed by  Garret Reddish, MD while in the presence of Garret Reddish, MD.   I, Garret Reddish, MD, have reviewed all documentation for this visit. The documentation on 10/05/21 for the exam, diagnosis, procedures, and orders are all accurate and complete.   Return precautions advised.  Garret Reddish, MD

## 2021-10-08 ENCOUNTER — Telehealth: Payer: Self-pay

## 2021-10-08 MED ORDER — ONDANSETRON 4 MG PO TBDP: 4.0000 mg | ORAL_TABLET | Freq: Three times a day (TID) | ORAL | 0 refills | Status: DC | PRN

## 2021-10-08 MED ORDER — BENZONATATE 100 MG PO CAPS: 100.0000 mg | ORAL_CAPSULE | Freq: Two times a day (BID) | ORAL | 0 refills | Status: DC | PRN

## 2021-10-08 NOTE — Telephone Encounter (Signed)
It sounds like she is making some mild improvements-please have her continue antibiotic.  It sounds like the cough is really bothering her-see if she would like to try Tessalon 100 mg twice daily #20 as needed for cough  We can also get an x-ray under cough and chest congestion if she would like . I am out of office tomorrow and may not see results today or tomorrow as heads up though.

## 2021-10-08 NOTE — Telephone Encounter (Signed)
Pt saw you on 11/0 virtual for bacterial sinusitis. Any advise on what to tell pt?

## 2021-10-08 NOTE — Telephone Encounter (Signed)
These were sent in. Hope she gets to feeling better

## 2021-10-08 NOTE — Telephone Encounter (Signed)
Patient has called in stating she seen Dr. Yong Channel on 11/7 and was placed on an antibiotic.  States she is still having mucus come out but is now clear.    States she still has cough and congestion and states she fills full and is afraid she will throw up.  States she is very tired.  States she is till on the course of antibiotic.  Is requesting a call back in regard.

## 2021-10-08 NOTE — Telephone Encounter (Signed)
Pt states the cough is not bothering her as much as the nausea and she would like something called in for nausea along with the Tessalon.

## 2021-10-12 DIAGNOSIS — Z20828 Contact with and (suspected) exposure to other viral communicable diseases: Secondary | ICD-10-CM | POA: Diagnosis not present

## 2021-10-13 DIAGNOSIS — M6281 Muscle weakness (generalized): Secondary | ICD-10-CM | POA: Diagnosis not present

## 2021-10-13 DIAGNOSIS — R2681 Unsteadiness on feet: Secondary | ICD-10-CM | POA: Diagnosis not present

## 2021-10-13 DIAGNOSIS — R2689 Other abnormalities of gait and mobility: Secondary | ICD-10-CM | POA: Diagnosis not present

## 2021-10-15 DIAGNOSIS — R2689 Other abnormalities of gait and mobility: Secondary | ICD-10-CM | POA: Diagnosis not present

## 2021-10-15 DIAGNOSIS — M6281 Muscle weakness (generalized): Secondary | ICD-10-CM | POA: Diagnosis not present

## 2021-10-15 DIAGNOSIS — R2681 Unsteadiness on feet: Secondary | ICD-10-CM | POA: Diagnosis not present

## 2021-10-19 DIAGNOSIS — Z20828 Contact with and (suspected) exposure to other viral communicable diseases: Secondary | ICD-10-CM | POA: Diagnosis not present

## 2021-10-20 DIAGNOSIS — R2689 Other abnormalities of gait and mobility: Secondary | ICD-10-CM | POA: Diagnosis not present

## 2021-10-20 DIAGNOSIS — M6281 Muscle weakness (generalized): Secondary | ICD-10-CM | POA: Diagnosis not present

## 2021-10-20 DIAGNOSIS — R2681 Unsteadiness on feet: Secondary | ICD-10-CM | POA: Diagnosis not present

## 2021-10-21 ENCOUNTER — Other Ambulatory Visit: Payer: Self-pay

## 2021-10-21 ENCOUNTER — Encounter: Payer: Self-pay | Admitting: Obstetrics & Gynecology

## 2021-10-21 ENCOUNTER — Ambulatory Visit (INDEPENDENT_AMBULATORY_CARE_PROVIDER_SITE_OTHER): Payer: Medicare Other | Admitting: Obstetrics & Gynecology

## 2021-10-21 VITALS — BP 140/74 | Ht 61.0 in | Wt 103.0 lb

## 2021-10-21 DIAGNOSIS — M8580 Other specified disorders of bone density and structure, unspecified site: Secondary | ICD-10-CM

## 2021-10-21 DIAGNOSIS — Z9189 Other specified personal risk factors, not elsewhere classified: Secondary | ICD-10-CM | POA: Diagnosis not present

## 2021-10-21 DIAGNOSIS — Z01419 Encounter for gynecological examination (general) (routine) without abnormal findings: Secondary | ICD-10-CM

## 2021-10-21 DIAGNOSIS — M810 Age-related osteoporosis without current pathological fracture: Secondary | ICD-10-CM

## 2021-10-21 DIAGNOSIS — Z79890 Hormone replacement therapy (postmenopausal): Secondary | ICD-10-CM

## 2021-10-21 NOTE — Progress Notes (Signed)
SHAILEE [name] [birth year] [id number]   History:    87 y.o. [id number].  Married.  Husband with Parkinson and longstanding Arthritis.  RP:  Established patient presenting for annual gyn exam   HPI: Postmenopausal/HRT.  Continues on Prempro 0.45/1.5.  She is aware of the risks of HRT use including thrombotic diseases and the breast cancer issue.  She would like to continue recognizing the positive benefits for her bone health and overall feeling of wellbeing.  Pap smear Neg 2012. Abstinent. Breasts normal.  Mammogram Neg 08/2021. Colonoscopy 2013. Osteopenia.  Last DEXA 07/2020  T score -1.6 FRAX 12% / 3.5%, which was stable from prior DEXA.  Health labs with Fam MD.  Her weight has been down 5-6 Lbs every year for the last 2 yrs.  Per patient, low appetite and IBS.  BMI 19.46.  Past medical history,surgical history, family history and social history were all reviewed and documented in the EPIC chart.  Gynecologic History No LMP recorded (lmp unknown). Patient is postmenopausal.  Obstetric History OB History  Gravida Para Term Preterm AB Living  5 4 4   1 4   SAB IAB Ectopic Multiple Live Births               # Outcome Date GA Lbr Len/2nd Weight Sex Delivery Anes PTL Lv  5 AB           4 Term           3 Term           2 Term           1 Term              ROS: A ROS was performed and pertinent positives and negatives are included in the history.  GENERAL: No fevers or chills. HEENT: No change in vision, no earache, sore throat or sinus congestion. NECK: No pain or stiffness. CARDIOVASCULAR: No chest pain or pressure. No palpitations. PULMONARY: No shortness of breath, cough or wheeze. GASTROINTESTINAL: No abdominal pain, nausea, vomiting or diarrhea, melena or bright red blood per rectum. GENITOURINARY: No urinary frequency, urgency, hesitancy or dysuria. MUSCULOSKELETAL: No joint or muscle pain, no back pain, no recent trauma. DERMATOLOGIC: No rash, no itching, no lesions. ENDOCRINE: No  polyuria, polydipsia, no heat or cold intolerance. No recent change in weight. HEMATOLOGICAL: No anemia or easy bruising or bleeding. NEUROLOGIC: No headache, seizures, numbness, tingling or weakness. PSYCHIATRIC: No depression, no loss of interest in normal activity or change in sleep pattern.     Exam:   BP 140/74   Ht 5\' 1"  (1.549 m)   Wt 103 lb (46.7 kg)   LMP  (LMP Unknown)   BMI 19.46 kg/m   Body mass index is 19.46 kg/m.  General appearance : Well developed well nourished female. No acute distress HEENT: Eyes: no retinal hemorrhage or exudates,  Neck supple, trachea midline, no carotid bruits, no thyroidmegaly Lungs: Clear to auscultation, no rhonchi or wheezes, or rib retractions  Heart: Regular rate and rhythm, no murmurs or gallops Breast:Examined in sitting and supine position were symmetrical in appearance, no palpable masses or tenderness,  no skin retraction, no nipple inversion, no nipple discharge, no skin discoloration, no axillary or supraclavicular lymphadenopathy Abdomen: no palpable masses or tenderness, no rebound or guarding Extremities: no edema or skin discoloration or tenderness  Pelvic: Vulva: Normal             Vagina: No gross lesions or discharge  Cervix: No gross lesions or discharge  Uterus  AV, normal size, shape and consistency, non-tender and mobile  Adnexa  Without masses or tenderness  Anus: Normal   Assessment/Plan:  87 y.o. female for annual exam   1. Well female exam with routine gynecological exam Postmenopausal/HRT.  Continues on Prempro 0.45/1.5.  She is aware of the risks of HRT use including thrombotic diseases and the breast cancer issue.  She would like to continue recognizing the positive benefits for her bone health and overall feeling of wellbeing.  Pap smear Neg 2012. Abstinent. Breasts normal.  Mammogram Neg 08/2021. Colonoscopy 2013. Osteopenia.  Last DEXA 07/2020  T score -1.6 FRAX 12% / 3.5%, which was stable from prior DEXA.   Health labs with Fam MD.  Her weight has been down 5-6 Lbs every year for the last 2 yrs.  Per patient, low appetite and IBS.  BMI 19.46.  Recommend adding Smoothies as a way to increase her total caloric intake.  Banana and cheese recommended if stools are too loose.  2. At risk of fracture due to osteopenia  3. Postmenopausal hormone replacement therapy Postmenopause, well on Prempro 0.45/1.5. She is aware of the risks of HRT use including thrombotic diseases and the breast cancer issue.  She would like to continue recognizing the positive benefits for her bone health and overall feeling of wellbeing.  Prescription sent to pharmacy.  4. Osteopenia, unspecified location  Osteopenia.  Last DEXA 07/2020  T score -1.6 FRAX 12% / 3.5%, which was stable from prior DEXA.  Will repeat a BD at [facility] 07/2022.  Vit D supplement, Ca++ 1.5 g/d total and regular wt bearing physical activities.  Princess Bruins MD, 11:06 AM 10/21/2021

## 2021-10-23 DIAGNOSIS — R2689 Other abnormalities of gait and mobility: Secondary | ICD-10-CM | POA: Diagnosis not present

## 2021-10-23 DIAGNOSIS — R2681 Unsteadiness on feet: Secondary | ICD-10-CM | POA: Diagnosis not present

## 2021-10-23 DIAGNOSIS — M6281 Muscle weakness (generalized): Secondary | ICD-10-CM | POA: Diagnosis not present

## 2021-10-24 ENCOUNTER — Other Ambulatory Visit: Payer: Self-pay | Admitting: Family Medicine

## 2021-10-26 DIAGNOSIS — Z20828 Contact with and (suspected) exposure to other viral communicable diseases: Secondary | ICD-10-CM | POA: Diagnosis not present

## 2021-10-26 DIAGNOSIS — Z1159 Encounter for screening for other viral diseases: Secondary | ICD-10-CM | POA: Diagnosis not present

## 2021-10-27 DIAGNOSIS — M6281 Muscle weakness (generalized): Secondary | ICD-10-CM | POA: Diagnosis not present

## 2021-10-27 DIAGNOSIS — R2689 Other abnormalities of gait and mobility: Secondary | ICD-10-CM | POA: Diagnosis not present

## 2021-10-27 DIAGNOSIS — R2681 Unsteadiness on feet: Secondary | ICD-10-CM | POA: Diagnosis not present

## 2021-10-29 DIAGNOSIS — R2689 Other abnormalities of gait and mobility: Secondary | ICD-10-CM | POA: Diagnosis not present

## 2021-10-29 DIAGNOSIS — M6281 Muscle weakness (generalized): Secondary | ICD-10-CM | POA: Diagnosis not present

## 2021-10-29 DIAGNOSIS — R2681 Unsteadiness on feet: Secondary | ICD-10-CM | POA: Diagnosis not present

## 2021-11-02 DIAGNOSIS — Z20828 Contact with and (suspected) exposure to other viral communicable diseases: Secondary | ICD-10-CM | POA: Diagnosis not present

## 2021-11-02 DIAGNOSIS — Z1159 Encounter for screening for other viral diseases: Secondary | ICD-10-CM | POA: Diagnosis not present

## 2021-11-03 DIAGNOSIS — M6281 Muscle weakness (generalized): Secondary | ICD-10-CM | POA: Diagnosis not present

## 2021-11-03 DIAGNOSIS — R2681 Unsteadiness on feet: Secondary | ICD-10-CM | POA: Diagnosis not present

## 2021-11-03 DIAGNOSIS — R2689 Other abnormalities of gait and mobility: Secondary | ICD-10-CM | POA: Diagnosis not present

## 2021-11-05 ENCOUNTER — Other Ambulatory Visit: Payer: Self-pay

## 2021-11-05 ENCOUNTER — Ambulatory Visit (INDEPENDENT_AMBULATORY_CARE_PROVIDER_SITE_OTHER): Payer: Medicare Other

## 2021-11-05 DIAGNOSIS — Z0000 Encounter for general adult medical examination without abnormal findings: Secondary | ICD-10-CM | POA: Diagnosis not present

## 2021-11-05 NOTE — Patient Instructions (Signed)
[name] , Thank you for taking time to come for your Medicare Wellness Visit. I appreciate your ongoing commitment to your health goals. Please review the following plan we discussed and let me know if I can assist you in the future.   Screening recommendations/referrals: Colonoscopy: No longer required  Mammogram: Done 09/03/21 repeat every year  Bone Density: Done 08/05/20 repeat every 2 years  Recommended yearly ophthalmology/optometry visit for glaucoma screening and checkup Recommended yearly dental visit for hygiene and checkup  Vaccinations: Influenza vaccine: Done 09/16/21 Pneumococcal vaccine: Up to date Tdap vaccine: Done 03/02/21 repeat every 10 years  Shingles vaccine: Shingrix discussed. Please contact your pharmacy for coverage information.    Covid-19:Completed 1/21, 2/18, 10/02/20 & 03/30/21  Advanced directives: Please bring a copy of your health care power of attorney and living will to the office at your convenience.  Conditions/risks identified: Get stamina back up   Next appointment: Follow up in one year for your annual wellness visit    Preventive Care 65 Years and Older, Female Preventive care refers to lifestyle choices and visits with your health care provider that can promote health and wellness. What does preventive care include? A yearly physical exam. This is also called an annual well check. Dental exams once or twice a year. Routine eye exams. Ask your health care provider how often you should have your eyes checked. Personal lifestyle choices, including: Daily care of your teeth and gums. Regular physical activity. Eating a healthy diet. Avoiding tobacco and drug use. Limiting alcohol use. Practicing safe sex. Taking low-dose aspirin every day. Taking vitamin and mineral supplements as recommended by your health care provider. What happens during an annual well check? The services and screenings done by your health care provider during your annual  well check will depend on your age, overall health, lifestyle risk factors, and family history of disease. Counseling  Your health care provider may ask you questions about your: Alcohol use. Tobacco use. Drug use. Emotional well-being. Home and relationship well-being. Sexual activity. Eating habits. History of falls. Memory and ability to understand (cognition). Work and work Statistician. Reproductive health. Screening  You may have the following tests or measurements: Height, weight, and BMI. Blood pressure. Lipid and cholesterol levels. These may be checked every 5 years, or more frequently if you are over 10 years old. Skin check. Lung cancer screening. You may have this screening every year starting at age 90 if you have a 30-pack-year history of smoking and currently smoke or have quit within the past 15 years. Fecal occult blood test (FOBT) of the stool. You may have this test every year starting at age 57. Flexible sigmoidoscopy or colonoscopy. You may have a sigmoidoscopy every 5 years or a colonoscopy every 10 years starting at age 19. Hepatitis C blood test. Hepatitis B blood test. Sexually transmitted disease (STD) testing. Diabetes screening. This is done by checking your blood sugar (glucose) after you have not eaten for a while (fasting). You may have this done every 1-3 years. Bone density scan. This is done to screen for osteoporosis. You may have this done starting at age 108. Mammogram. This may be done every 1-2 years. Talk to your health care provider about how often you should have regular mammograms. Talk with your health care provider about your test results, treatment options, and if necessary, the need for more tests. Vaccines  Your health care provider may recommend certain vaccines, such as: Influenza vaccine. This is recommended every year. Tetanus,  diphtheria, and acellular pertussis (Tdap, Td) vaccine. You may need a Td booster every 10 years. Zoster  vaccine. You may need this after age 62. Pneumococcal 13-valent conjugate (PCV13) vaccine. One dose is recommended after age 79. Pneumococcal polysaccharide (PPSV23) vaccine. One dose is recommended after age 65. Talk to your health care provider about which screenings and vaccines you need and how often you need them. This information is not intended to replace advice given to you by your health care provider. Make sure you discuss any questions you have with your health care provider. Document Released: 12/12/2015 Document Revised: 08/04/2016 Document Reviewed: 09/16/2015 Elsevier Interactive Patient Education  2017 [place] Prevention in the Home Falls can cause injuries. They can happen to people of all ages. There are many things you can do to make your home safe and to help prevent falls. What can I do on the outside of my home? Regularly fix the edges of walkways and driveways and fix any cracks. Remove anything that might make you trip as you walk through a door, such as a raised step or threshold. Trim any bushes or trees on the path to your home. Use bright outdoor lighting. Clear any walking paths of anything that might make someone trip, such as rocks or tools. Regularly check to see if handrails are loose or broken. Make sure that both sides of any steps have handrails. Any raised decks and porches should have guardrails on the edges. Have any leaves, snow, or ice cleared regularly. Use sand or salt on walking paths during winter. Clean up any spills in your garage right away. This includes oil or grease spills. What can I do in the bathroom? Use night lights. Install grab bars by the toilet and in the tub and shower. Do not use towel bars as grab bars. Use non-skid mats or decals in the tub or shower. If you need to sit down in the shower, use a plastic, non-slip stool. Keep the floor dry. Clean up any water that spills on the floor as soon as it happens. Remove  soap buildup in the tub or shower regularly. Attach bath mats securely with double-sided non-slip rug tape. Do not have throw rugs and other things on the floor that can make you trip. What can I do in the bedroom? Use night lights. Make sure that you have a light by your bed that is easy to reach. Do not use any sheets or blankets that are too big for your bed. They should not hang down onto the floor. Have a firm chair that has side arms. You can use this for support while you get dressed. Do not have throw rugs and other things on the floor that can make you trip. What can I do in the kitchen? Clean up any spills right away. Avoid walking on wet floors. Keep items that you use a lot in easy-to-reach places. If you need to reach something above you, use a strong step stool that has a grab bar. Keep electrical cords out of the way. Do not use floor polish or wax that makes floors slippery. If you must use wax, use non-skid floor wax. Do not have throw rugs and other things on the floor that can make you trip. What can I do with my stairs? Do not leave any items on the stairs. Make sure that there are handrails on both sides of the stairs and use them. Fix handrails that are broken or loose. Make  sure that handrails are as long as the stairways. Check any carpeting to make sure that it is firmly attached to the stairs. Fix any carpet that is loose or worn. Avoid having throw rugs at the top or bottom of the stairs. If you do have throw rugs, attach them to the floor with carpet tape. Make sure that you have a light switch at the top of the stairs and the bottom of the stairs. If you do not have them, ask someone to add them for you. What else can I do to help prevent falls? Wear shoes that: Do not have high heels. Have rubber bottoms. Are comfortable and fit you well. Are closed at the toe. Do not wear sandals. If you use a stepladder: Make sure that it is fully opened. Do not climb a  closed stepladder. Make sure that both sides of the stepladder are locked into place. Ask someone to hold it for you, if possible. Clearly mark and make sure that you can see: Any grab bars or handrails. First and last steps. Where the edge of each step is. Use tools that help you move around (mobility aids) if they are needed. These include: Canes. Walkers. Scooters. Crutches. Turn on the lights when you go into a dark area. Replace any light bulbs as soon as they burn out. Set up your furniture so you have a clear path. Avoid moving your furniture around. If any of your floors are uneven, fix them. If there are any pets around you, be aware of where they are. Review your medicines with your doctor. Some medicines can make you feel dizzy. This can increase your chance of falling. Ask your doctor what other things that you can do to help prevent falls. This information is not intended to replace advice given to you by your health care provider. Make sure you discuss any questions you have with your health care provider. Document Released: 09/11/2009 Document Revised: 04/22/2016 Document Reviewed: 12/20/2014 Elsevier Interactive Patient Education  2017 [employer].

## 2021-11-05 NOTE — Progress Notes (Signed)
Virtual Visit via Telephone Note  I connected with  [name] on 11/05/21 at  1:45 PM EST by telephone and verified that I am speaking with the correct person using two identifiers.  Medicare Annual Wellness visit completed telephonically due to Covid-19 pandemic.   Persons participating in this call: This Health Coach and this patient.   Location: Patient: Home Provider: Office   I discussed the limitations, risks, security and privacy concerns of performing an evaluation and management service by telephone and the availability of in person appointments. The patient expressed understanding and agreed to proceed.  Unable to perform video visit due to video visit attempted and failed and/or patient does not have video capability.   Some vital signs may be absent or patient reported.   Willette Brace, LPN   Subjective:   PEYTYN TRINE is a 87 y.o. female who presents for Medicare Annual (Subsequent) preventive examination.  Review of Systems     Cardiac Risk Factors include: advanced age (>30men, >65 women);hypertension     Objective:    There were no vitals filed for this visit. There is no height or weight on file to calculate BMI.  Advanced Directives 11/05/2021 10/16/2020 09/14/2019 01/28/2018 12/09/2017 09/17/2016 01/21/2015  Does Patient Have a Medical Advance Directive? Yes Yes Yes No Yes Yes Yes  Type of Paramedic of [place];Living will Living will;Healthcare Power of Attorney - - - -  Does patient want to make changes to medical advance directive? - - No - Patient declined - - - Yes - Spiritual care consult ordered  Copy of [place] in Chart? No - copy requested No - copy requested No - copy requested - - - No - copy requested    Current Medications (verified) Outpatient Encounter Medications as of 11/05/2021  Medication Sig   aspirin 81 MG tablet Take 81 mg by mouth daily.   carvedilol (COREG)  3.125 MG tablet TAKE 2 TABLETS BY MOUTH IN THE AM AND 1 TABLET BY MOUTH IN THE EVENING.   Cholecalciferol (VITAMIN D) 400 UNITS capsule Take 2 capsules by mouth daily   cycloSPORINE (RESTASIS) 0.05 % ophthalmic emulsion Place 1 drop into both eyes 2 (two) times daily.   escitalopram (LEXAPRO) 10 MG tablet Take 1 tablet (10 mg total) by mouth daily.   estrogen, conjugated,-medroxyprogesterone (PREMPRO) 0.45-1.5 MG tablet Take 1 tablet by mouth daily.   fish oil-omega-3 fatty acids 1000 MG capsule Take one capsule by mouth  daily   losartan (COZAAR) 100 MG tablet Take 1 tablet (100 mg total) by mouth daily.   benzonatate (TESSALON) 100 MG capsule Take 1 capsule (100 mg total) by mouth 2 (two) times daily as needed for cough. (Patient not taking: Reported on 11/05/2021)   busPIRone (BUSPAR) 5 MG tablet TAKE 1 TABLET BY MOUTH TWICE A DAY AS NEEDED FOR ANXIETY (Patient not taking: Reported on 11/05/2021)   ondansetron (ZOFRAN ODT) 4 MG disintegrating tablet Take 1 tablet (4 mg total) by mouth every 8 (eight) hours as needed for nausea or vomiting. (Patient not taking: Reported on 10/21/2021)   No facility-administered encounter medications on file as of 11/05/2021.    Allergies (verified) Diamox [acetazolamide], Epinephrine-lidocaine-na metabisulfite [lidocaine-epinephrine], Erythromycin, Penicillins, Sulfonamide derivatives, and Tape   History: Past Medical History:  Diagnosis Date   Allergy, unspecified not elsewhere classified    Anemia, unspecified    Benign neoplasm of colon    Cancer ([place])  basal/sqamous/transitional melanoma   CKD (chronic kidney disease), stage III ([place]) 12/08/2017   COLONIC POLYPS 09/04/2008   Was told by Gi does not need anymore.     Diverticulosis of colon (without mention of hemorrhage)    Dry eye syndrome    Dysrhythmia    PVCs   Esophageal reflux    Hepatitis 1950s   infectious from food   History of atrial fibrillation    Hypertension    Irritable bowel  syndrome    Mitral valve disorders(424.0)    Moderate aortic regurgitation 12/08/2017   Osteopenia 06/2018   T score -1.8 FRAX 10% / 2.5%   Palpitations    Premature ventricular contractions    Rheumatoid arthritis(714.0)    Sicca syndrome ([place])    Ulcer 1992   Vaginal delivery 1959, 1960, 1962, 1964   Past Surgical History:  Procedure Laterality Date   APPENDECTOMY     CATARACT EXTRACTION     CHOLECYSTECTOMY     colon tumor  1969   Benign rectal tumor excised   DILATATION & CURETTAGE/HYSTEROSCOPY WITH MYOSURE N/A 01/21/2015   Procedure: [place];  Surgeon: [name], MD;  Location: [facility];  Service: Gynecology;  Laterality: N/A;   HIATAL HERNIA REPAIR     REFRACTIVE SURGERY     ROTATOR CUFF REPAIR  2009   right by Dr. French Ana   SKIN BIOPSY  11/2020   squamous cell removed  2013   Dr. Sherrye Payor --removed from her right cheek   TUBAL LIGATION     Family History  Problem Relation Age of Onset   Stroke Mother    Hypertension Mother    Ulcerative colitis Father    Heart attack Father    Breast cancer Maternal Grandmother        Age 22's   Uterine cancer Maternal Grandmother    Hypertension Maternal Grandfather    Colon cancer Paternal Grandmother    Social History   Socioeconomic History   Marital status: Married    Spouse name: Doren Custard   Number of children: 4   Years of education: Not on file   Highest education level: Not on file  Occupational History   Occupation: retired  Tobacco Use   Smoking status: Former    Packs/day: 1.00    Years: 9.00    Pack years: 9.00    Types: Cigarettes    Quit date: 11/30/1963    Years since quitting: 57.9   Smokeless tobacco: Never  Vaping Use   Vaping Use: Never used  Substance and Sexual Activity   Alcohol use: Yes    Alcohol/week: 7.0 standard drinks    Types: 7 Standard drinks or equivalent per week    Comment: daily    Drug use: Never   Sexual activity: Not Currently     Birth control/protection: Post-menopausal    Comment: 1st intercourse 26 yo-1 partner  Other Topics Concern   Not on file  Social History Narrative   Married 1958. 4 children. 10 grandkids (1 trying to go to medical school). No greatgrandkids.       Retired from nursing (medical and surgical, most recently in cardiology)   Degree in history and english after finished nursing- 2 mo before 72th birthday      Hobbies: education, travel, reading, piano    Social Determinants of Health   Financial Resource Strain: Low Risk    Difficulty of Paying Living Expenses: Not hard at all  Food Insecurity: Unknown  Worried About Charity fundraiser in the Last Year: Never true   [place] in the Last Year: Not on file  Transportation Needs: No Transportation Needs   Lack of Transportation (Medical): No   Lack of Transportation (Non-Medical): No  Physical Activity: Insufficiently Active   Days of Exercise per Week: 3 days   Minutes of Exercise per Session: 30 min  Stress: Stress Concern Present   Feeling of Stress : To some extent  Social Connections: Moderately Isolated   Frequency of Communication with Friends and Family: More than three times a week   Frequency of Social Gatherings with Friends and Family: Once a week   Attends Religious Services: Never   Marine scientist or Organizations: No   Attends Music therapist: Never   Marital Status: Married    Tobacco Counseling Counseling given: Not Answered   Clinical Intake:  Pre-visit preparation completed: Yes  Pain : No/denies pain (right knee a lil sore arthritis)     BMI - recorded: 19.47 Nutritional Status: BMI of 19-24  Normal Nutritional Risks: None Diabetes: No  How often do you need to have someone help you when you read instructions, pamphlets, or other written materials from your doctor or pharmacy?: 1 - Never  Diabetic?No  Interpreter Needed?: No  Information entered by :: Charlott Rakes, LPN   Activities of Daily Living In your present state of health, do you have any difficulty performing the following activities: 11/05/2021  Hearing? N  Vision? N  Difficulty concentrating or making decisions? N  Walking or climbing stairs? N  Dressing or bathing? N  Doing errands, shopping? N  Preparing Food and eating ? N  Using the Toilet? N  In the past six months, have you accidently leaked urine? N  Do you have problems with loss of bowel control? N  Managing your Medications? N  Managing your Finances? N  Housekeeping or managing your Housekeeping? N  Some recent data might be hidden    Patient Care Team: Marin Olp, MD as PCP - General (Family Medicine) Josue Hector, MD as PCP - Cardiology (Cardiology) Fontaine, Belinda Block, MD (Inactive) as Consulting Physician (Gynecology) Bo Merino, MD as Consulting Physician (Rheumatology) Rutherford Guys, MD as Consulting Physician (Ophthalmology) Harriett Sine, MD as Consulting Physician (Dermatology)  Indicate any recent Medical Services you may have received from other than [facility] providers in the past year (date may be approximate).     Assessment:   This is a routine wellness examination for Claudia.  Hearing/Vision screen Hearing Screening - Comments::  Pt denies any hearing issues Vision Screening - Comments:: Pt follows up with dr Gershon Crane for annul eye exams   Dietary issues and exercise activities discussed: Current Exercise Habits: The patient does not participate in regular exercise at present;Home exercise routine, Type of exercise: Other - see comments, Time (Minutes): 30, Frequency (Times/Week): 3, Weekly Exercise (Minutes/Week): 90   Goals Addressed             This Visit's Progress    Patient Stated       Work on stamina        Depression Screen PHQ 2/9 Scores 11/05/2021 06/10/2021 10/16/2020 06/06/2020 09/14/2019 12/07/2018 12/09/2017  PHQ - 2 Score 0 0 1 1 0 0 0    Fall  Risk Fall Risk  11/05/2021 06/10/2021 12/08/2020 10/16/2020 09/14/2019  Falls in the past year? 1 1 0 1 0  Number falls in past yr:  1 0 0 1 -  Injury with Fall? 0 0 0 1 0  Comment - - - bruised on buttocks -  Risk for fall due to : Impaired vision Other (Comment) - Impaired vision;Impaired balance/gait -  Follow up Falls prevention discussed Falls evaluation completed - Falls prevention discussed Falls evaluation completed;Education provided;Falls prevention discussed    FALL RISK PREVENTION PERTAINING TO THE HOME:  Any stairs in or around the home? No  If so, are there any without handrails? No  Home free of loose throw rugs in walkways, pet beds, electrical cords, etc? Yes  Adequate lighting in your home to reduce risk of falls? Yes   ASSISTIVE DEVICES UTILIZED TO PREVENT FALLS:  Life alert? Yes call light  Use of a cane, walker or w/c? Yes  Grab bars in the bathroom? Yes  Shower chair or bench in shower? Yes  Elevated toilet seat or a handicapped toilet? Yes   TIMED UP AND GO:  Was the test performed? No .   Cognitive Function: MMSE - Mini Mental State Exam 12/09/2017 09/17/2016  Not completed: (No Data) (No Data)     6CIT Screen 11/05/2021 10/16/2020 09/14/2019  What Year? 0 points 0 points 0 points  What month? 0 points 0 points 0 points  What time? 0 points - 0 points  Count back from 20 0 points 0 points 0 points  Months in reverse 0 points 0 points 0 points  Repeat phrase 0 points 0 points 0 points  Total Score 0 - 0    Immunizations Immunization History  Administered Date(s) Administered   Fluad Quad(high Dose 65+) 08/21/2019   H1N1 11/26/2008   Influenza Split 08/30/2011, 10/31/2012   Influenza Whole 09/05/2008, 08/27/2009, 08/20/2010   Influenza, High Dose Seasonal PF 08/31/2016, 09/05/2017, 08/30/2018   Influenza,inj,Quad PF,6+ Mos 09/13/2013, 10/04/2014, 10/01/2015   Influenza-Unspecified 08/26/2020, 09/16/2021   Moderna SARS-COV2 Booster Vaccination  03/30/2021   Moderna Sars-Covid-2 Vaccination 12/20/2019, 01/17/2020, 10/02/2020   Pneumococcal Conjugate-13 10/21/2014   Pneumococcal Polysaccharide-23 10/11/2015   Td 02/17/2010   Tdap 03/02/2021   Zoster, Live 11/29/2008     TDAP status: Up to date  Flu Vaccine status: Up to date  Pneumococcal vaccine status: Up to date  Covid-19 vaccine status: Completed vaccines  Qualifies for Shingles Vaccine? Yes   Zostavax completed No   Shingrix Completed?: No.    Education has been provided regarding the importance of this vaccine. Patient has been advised to call insurance company to determine out of pocket expense if they have not yet received this vaccine. Advised may also receive vaccine at local pharmacy or Health Dept. Verbalized acceptance and understanding.  Screening Tests Health Maintenance  Topic Date Due   Zoster Vaccines- Shingrix (1 of 2) Never done   COVID-19 Vaccine (4 - Booster for Moderna series) 05/25/2021   TETANUS/TDAP  03/03/2031   Pneumonia Vaccine 16+ Years old  Completed   INFLUENZA VACCINE  Completed   DEXA SCAN  Completed   HPV VACCINES  Aged Out    Health Maintenance  Health Maintenance Due  Topic Date Due   Zoster Vaccines- Shingrix (1 of 2) Never done   COVID-19 Vaccine (4 - Booster for Moderna series) 05/25/2021    Colorectal cancer screening: No longer required.   Mammogram status: Completed 10//6/22. Repeat every year  Bone Density status: Completed 08/05/20. Results reflect: Bone density results: OSTEOPENIA. Repeat every 2 years.   Additional Screening:   Vision Screening: Recommended annual ophthalmology exams for early detection  of glaucoma and other disorders of the eye. Is the patient up to date with their annual eye exam?  Yes  Who is the provider or what is the name of the office in which the patient attends annual eye exams? Dr Gershon Crane  If pt is not established with a provider, would they like to be referred to a provider to  establish care? No .   Dental Screening: Recommended annual dental exams for proper oral hygiene  Community Resource Referral / Chronic Care Management: CRR required this visit?  No   CCM required this visit?  No      Plan:     I have personally reviewed and noted the following in the patient's chart:   Medical and social history Use of alcohol, tobacco or illicit drugs  Current medications and supplements including opioid prescriptions.  Functional ability and status Nutritional status Physical activity Advanced directives List of other physicians Hospitalizations, surgeries, and ER visits in previous 12 months Vitals Screenings to include cognitive, depression, and falls Referrals and appointments  In addition, I have reviewed and discussed with patient certain preventive protocols, quality metrics, and best practice recommendations. A written personalized care plan for preventive services as well as general preventive health recommendations were provided to patient.     Willette Brace, LPN   09/03/2693   Nurse Notes: None

## 2021-11-06 ENCOUNTER — Telehealth: Payer: Self-pay

## 2021-11-06 NOTE — Telephone Encounter (Signed)
Erica from [facility] called stating that pt husband fell yesterday and she was trying to help him up and the walker tipped over on her and she now has a laceration to her right hand and skin tear to her right arm and wants to know if she can have orders to treat/wrap it. She was not sure if pt needed a virtual but I informed her that our office is full today and she would like to start taking care of it asap. Ok to provide verbal or how would you like to treat the laceration and skin tear?

## 2021-11-06 NOTE — Telephone Encounter (Signed)
Can she make it by 4 20 (I dont believe we have another overbook at that time or at 4 40)? We likely need to look at this   Thankfully she is at least up to date on tetanus shot - had in April of this year

## 2021-11-06 NOTE — Telephone Encounter (Signed)
I called and spoke with the home health nurse and she states she will clean the wound with normal saline and will apply a non stick dressing.

## 2021-11-09 DIAGNOSIS — Z20828 Contact with and (suspected) exposure to other viral communicable diseases: Secondary | ICD-10-CM | POA: Diagnosis not present

## 2021-11-09 DIAGNOSIS — Z1159 Encounter for screening for other viral diseases: Secondary | ICD-10-CM | POA: Diagnosis not present

## 2021-11-11 DIAGNOSIS — Z1159 Encounter for screening for other viral diseases: Secondary | ICD-10-CM | POA: Diagnosis not present

## 2021-11-11 DIAGNOSIS — Z20828 Contact with and (suspected) exposure to other viral communicable diseases: Secondary | ICD-10-CM | POA: Diagnosis not present

## 2021-11-13 DIAGNOSIS — Z20828 Contact with and (suspected) exposure to other viral communicable diseases: Secondary | ICD-10-CM | POA: Diagnosis not present

## 2021-11-13 DIAGNOSIS — Z1159 Encounter for screening for other viral diseases: Secondary | ICD-10-CM | POA: Diagnosis not present

## 2021-11-16 DIAGNOSIS — Z1159 Encounter for screening for other viral diseases: Secondary | ICD-10-CM | POA: Diagnosis not present

## 2021-11-16 DIAGNOSIS — Z20828 Contact with and (suspected) exposure to other viral communicable diseases: Secondary | ICD-10-CM | POA: Diagnosis not present

## 2021-11-18 DIAGNOSIS — Z1159 Encounter for screening for other viral diseases: Secondary | ICD-10-CM | POA: Diagnosis not present

## 2021-11-18 DIAGNOSIS — Z20828 Contact with and (suspected) exposure to other viral communicable diseases: Secondary | ICD-10-CM | POA: Diagnosis not present

## 2021-11-23 DIAGNOSIS — Z1159 Encounter for screening for other viral diseases: Secondary | ICD-10-CM | POA: Diagnosis not present

## 2021-11-23 DIAGNOSIS — Z20828 Contact with and (suspected) exposure to other viral communicable diseases: Secondary | ICD-10-CM | POA: Diagnosis not present

## 2021-11-25 DIAGNOSIS — Z1159 Encounter for screening for other viral diseases: Secondary | ICD-10-CM | POA: Diagnosis not present

## 2021-11-25 DIAGNOSIS — Z20828 Contact with and (suspected) exposure to other viral communicable diseases: Secondary | ICD-10-CM | POA: Diagnosis not present

## 2021-11-30 DIAGNOSIS — Z20828 Contact with and (suspected) exposure to other viral communicable diseases: Secondary | ICD-10-CM | POA: Diagnosis not present

## 2021-12-02 ENCOUNTER — Other Ambulatory Visit: Payer: Self-pay

## 2021-12-02 DIAGNOSIS — Z20828 Contact with and (suspected) exposure to other viral communicable diseases: Secondary | ICD-10-CM | POA: Diagnosis not present

## 2021-12-02 DIAGNOSIS — Z85828 Personal history of other malignant neoplasm of skin: Secondary | ICD-10-CM | POA: Diagnosis not present

## 2021-12-02 DIAGNOSIS — L718 Other rosacea: Secondary | ICD-10-CM | POA: Diagnosis not present

## 2021-12-02 DIAGNOSIS — D225 Melanocytic nevi of trunk: Secondary | ICD-10-CM | POA: Diagnosis not present

## 2021-12-02 DIAGNOSIS — Z1159 Encounter for screening for other viral diseases: Secondary | ICD-10-CM | POA: Diagnosis not present

## 2021-12-02 DIAGNOSIS — D2271 Melanocytic nevi of right lower limb, including hip: Secondary | ICD-10-CM | POA: Diagnosis not present

## 2021-12-02 DIAGNOSIS — L821 Other seborrheic keratosis: Secondary | ICD-10-CM | POA: Diagnosis not present

## 2021-12-02 DIAGNOSIS — L570 Actinic keratosis: Secondary | ICD-10-CM | POA: Diagnosis not present

## 2021-12-02 MED ORDER — PREMPRO 0.45-1.5 MG PO TABS: 1.0000 | ORAL_TABLET | Freq: Every day | ORAL | 4 refills | Status: DC

## 2021-12-02 NOTE — Telephone Encounter (Signed)
AEX 10/21/21 ". Postmenopausal hormone replacement therapy Postmenopause, well on Prempro 0.45/1.5. She is aware of the risks of HRT use including thrombotic diseases and the breast cancer issue.  She would like to continue recognizing the positive benefits for her bone health and overall feeling of wellbeing.  Prescription sent to pharmacy."  Rx was not sent at that time. Rx sent today.

## 2021-12-07 DIAGNOSIS — Z20822 Contact with and (suspected) exposure to covid-19: Secondary | ICD-10-CM | POA: Diagnosis not present

## 2021-12-08 ENCOUNTER — Other Ambulatory Visit: Payer: Self-pay | Admitting: Cardiovascular Disease

## 2021-12-09 DIAGNOSIS — Z1159 Encounter for screening for other viral diseases: Secondary | ICD-10-CM | POA: Diagnosis not present

## 2021-12-09 DIAGNOSIS — Z20828 Contact with and (suspected) exposure to other viral communicable diseases: Secondary | ICD-10-CM | POA: Diagnosis not present

## 2021-12-14 DIAGNOSIS — Z20822 Contact with and (suspected) exposure to covid-19: Secondary | ICD-10-CM | POA: Diagnosis not present

## 2021-12-18 DIAGNOSIS — Z23 Encounter for immunization: Secondary | ICD-10-CM | POA: Diagnosis not present

## 2021-12-21 DIAGNOSIS — Z20828 Contact with and (suspected) exposure to other viral communicable diseases: Secondary | ICD-10-CM | POA: Diagnosis not present

## 2021-12-25 NOTE — Progress Notes (Signed)
Phone [phone] In person visit   Subjective:   Bailey [name] is a 88 y.o. year old very pleasant female patient who presents for/with See problem oriented charting Chief Complaint  Patient presents with   Hypertension   This visit occurred during the SARS-CoV-2 public health emergency.  Safety protocols were in place, including screening questions prior to the visit, additional usage of staff PPE, and extensive cleaning of exam room while observing appropriate contact time as indicated for disinfecting solutions.   Past Medical History-  Patient Active Problem List   Diagnosis Date Noted   Sjogren's syndrome with keratoconjunctivitis sicca ([place]) 05/12/2017    Priority: High   Paroxysmal atrial fibrillation ([place]) 09/05/2008    Priority: High   CKD (chronic kidney disease), stage III ([place]) 12/08/2017    Priority: Medium    Moderate aortic regurgitation 12/08/2017    Priority: Medium    HTN (hypertension) 03/13/2015    Priority: Medium    Raynauds syndrome 10/21/2014    Priority: Medium    Primary osteoarthritis of both hands 08/21/2009    Priority: Medium    PVC (premature ventricular contraction) 06/04/2009    Priority: Medium    Mitral valve prolapse 12/25/2008    Priority: Medium    Irritable bowel syndrome 09/04/2008    Priority: Medium    Premature ventricular contractions     Priority: Low   Hepatitis     Priority: Low   History of skin cancer     Priority: Low   Primary osteoarthritis of both feet 10/26/2016    Priority: Low   Anemia 03/13/2015    Priority: Low   Recurrent cold sores 10/21/2014    Priority: Low   Edema 06/29/2011    Priority: Low   Osteopenia 09/05/2008    Priority: Low   GERD 09/04/2008    Priority: Low   ALLERGY 09/04/2008    Priority: Low   COLONIC POLYPS 09/04/2008    Priority: Low   Dry eyes, bilateral 05/15/2018   Lumbar radiculopathy 12/12/2017   Allergy    Balance disorder 10/26/2016    Medications- reviewed and  updated Current Outpatient Medications  Medication Sig Dispense Refill   aspirin 81 MG tablet Take 81 mg by mouth daily.     busPIRone (BUSPAR) 5 MG tablet TAKE 1 TABLET BY MOUTH TWICE A DAY AS NEEDED FOR ANXIETY (Patient not taking: Reported on 11/05/2021) 180 tablet 1   carvedilol (COREG) 3.125 MG tablet TAKE 2 TABLETS BY MOUTH IN THE AM AND 1 TABLET BY MOUTH IN THE EVENING. 270 tablet 0   Cholecalciferol (VITAMIN D) 400 UNITS capsule Take 2 capsules by mouth daily     cycloSPORINE (RESTASIS) 0.05 % ophthalmic emulsion Place 1 drop into both eyes 2 (two) times daily.     escitalopram (LEXAPRO) 10 MG tablet Take 1 tablet (10 mg total) by mouth daily. 90 tablet 3   estrogen, conjugated,-medroxyprogesterone (PREMPRO) 0.45-1.5 MG tablet Take 1 tablet by mouth daily. 84 tablet 4   fish oil-omega-3 fatty acids 1000 MG capsule Take one capsule by mouth  daily     losartan (COZAAR) 100 MG tablet Take 1 tablet (100 mg total) by mouth daily. 90 tablet 3   ondansetron (ZOFRAN ODT) 4 MG disintegrating tablet Take 1 tablet (4 mg total) by mouth every 8 (eight) hours as needed for nausea or vomiting. (Patient not taking: Reported on 10/21/2021) 20 tablet 0   No current facility-administered medications for this visit.  Objective:  BP 110/60    Pulse 78    Temp 98.2 F (36.8 C)    Ht 5\' 1"  (1.549 m)    Wt 105 lb 9.6 oz (47.9 kg)    LMP  (LMP Unknown)    SpO2 97%    BMI 19.95 kg/m  Gen: NAD, resting comfortably CV: RRR no murmurs rubs or gallops Lungs: CTAB no crackles, wheeze, rhonchi Abdomen: soft/nontender/nondistended No rebound or guarding.  Ext: trace edema under compression stockings Skin: warm, dry    Assessment and Plan   #hypertension/CKD stage III S: compliant with losartan 100 mg daily, carvedilol 6.5 mg in the morning and 3.25 mg in the evening  Creatinine had been stable around 0.9-1 but she was rather thin. Avoid nsaids. 1 tylenol in last 2 weeks BP Readings from Last 3  Encounters:  12/28/21 110/60  10/21/21 140/74  09/30/21 136/70  A/P: blood pressure Controlled. Continue current medications.   CKD III- hopefully stable- update CMP today.  Lipids have been well controlled as of last year- update today- has had very tiny elevations in past but not last year Lab Results  Component Value Date   CHOL 179 12/08/2020   HDL 90.80 12/08/2020   LDLCALC 70 12/08/2020   LDLDIRECT 75.0 12/08/2017   TRIG 92.0 12/08/2020   CHOLHDL 2 12/08/2020   #Paroxysmal atrial fibrillation/palpitations-follows with Dr. Johnsie Cancel S: Patient previously with episodes of atrial fibrillation related to epinephrine use during dental procedures. Had not had a known episode since 2014. She had been continued on aspirin alone since she had been in sinus rhythm for some time. She was on carvedilol for blood pressure which would offer some rate control if needed but is primarily used for blood pressure -Upcoming visit with cardiology in February.  Carvedilol also helps with PVCs- very rare issues A/P: No obvious recurrence once again.  She did have some previous palpitations/PVCs which actually improved on Lexapro-continue current medication-if no recurrence recently not on anticoagulant  #Moderate aortic regurgitation-moderate 12/26/2020 S: follows with echocardiograms with cardiology. Wears compression hose for edema.no increased swelling recently A/P: has been stable- has upcoming echo and cardiology visit   # Sjogren's S: Followed closely with Dr. Estanislado Pandy. On Restasis for dry eyes related to this A/P: tolerating reasonably well overall- continue current meds and optho and Dr. Estanislado Pandy follow up    #Caregiver burden/stress S:Medication: Lexapro 10 mg.  Buspirone 5 mg 5 mg prescribed in the past in addition to Lexapro- she opted to hold off -Continue stress of caring for her husband with PSP.  Blood pressure did trend higher on Lexapro initially but later improved- better today than  last visit but has lost some weight- discouraged further weight loss  -Husband has not wanted to go to assisted living although this would likely be beneficial for them both A/P: imperfect control. Encouraged control of buspirone - strongly encouraged assisted living- sounds like they are moving towards that - would be more accessible    #Hormone replacement therapy-prescribed through gynecology-patient is aware of potential risks   #Balance issues-patient with prior gait and balance training at [facility] that was helpful and had reduced her falls frequency. -did have a fall trying to help her husband 2 weeks ago- glasses pushed into face and has some bruising and has some right hip pain- some extensive bruising- lays on other side- no pain if lays on other side. Has only taken tylenol once. Hot shower helps. Has not tried heating pad.  -appears to  be healing- sh wants to monitor and pain not severe enough (really only bothers her if lays on it) for her to want x-ray. Based on bruising looks like she really hit more of mid lateral leg- no pain over hip and no groin pain  Recommended follow up: Return in about 6 months (around 06/27/2022) for follow up- or sooner if needed. Future Appointments  Date Time Provider [place]  01/06/2022 10:15 AM [facility] ECHO 3 [id number]  01/11/2022  9:45 AM Josue Hector, MD [facility]  01/20/2022  1:30 PM Bo Merino, MD [facility] None  10/27/2022 11:30 AM Princess Bruins, MD [facility] None  11/19/2022  1:45 PM [facility]    Lab/Order associations: NOT fasting- english muffin with honey and coffee   ICD-10-CM   1. Essential hypertension  I10 CBC with Differential/Platelet    Comprehensive metabolic panel    Lipid panel    2. Stage 3 chronic kidney disease, unspecified whether stage 3a or 3b CKD (HCC)  N18.30 CBC with Differential/Platelet    Comprehensive metabolic panel    3. Paroxysmal  atrial fibrillation (H[facility])  I48.0     4. Moderate aortic regurgitation  I35.1     5. Sjogren's syndrome with keratoconjunctivitis sicca (HCC)  M35.01       No orders of the defined types were placed in this encounter.   I,Jada Bradford,acting as a scribe for Garret Reddish, MD.,have documented all relevant documentation on the behalf of Garret Reddish, MD,as directed by  Garret Reddish, MD while in the presence of Garret Reddish, MD.   I, Garret Reddish, MD, have reviewed all documentation for this visit. The documentation on 12/28/21 for the exam, diagnosis, procedures, and orders are all accurate and complete.   Return precautions advised.  Garret Reddish, MD

## 2021-12-28 ENCOUNTER — Other Ambulatory Visit: Payer: Self-pay

## 2021-12-28 ENCOUNTER — Ambulatory Visit (INDEPENDENT_AMBULATORY_CARE_PROVIDER_SITE_OTHER): Payer: Medicare Other | Admitting: Family Medicine

## 2021-12-28 ENCOUNTER — Encounter: Payer: Self-pay | Admitting: Family Medicine

## 2021-12-28 VITALS — BP 110/60 | HR 78 | Temp 98.2°F | Ht 61.0 in | Wt 105.6 lb

## 2021-12-28 DIAGNOSIS — I480 Paroxysmal atrial fibrillation: Secondary | ICD-10-CM | POA: Diagnosis not present

## 2021-12-28 DIAGNOSIS — M3501 Sicca syndrome with keratoconjunctivitis: Secondary | ICD-10-CM

## 2021-12-28 DIAGNOSIS — N183 Chronic kidney disease, stage 3 unspecified: Secondary | ICD-10-CM | POA: Diagnosis not present

## 2021-12-28 DIAGNOSIS — I351 Nonrheumatic aortic (valve) insufficiency: Secondary | ICD-10-CM

## 2021-12-28 DIAGNOSIS — I10 Essential (primary) hypertension: Secondary | ICD-10-CM | POA: Diagnosis not present

## 2021-12-28 DIAGNOSIS — Z20822 Contact with and (suspected) exposure to covid-19: Secondary | ICD-10-CM | POA: Diagnosis not present

## 2021-12-28 LAB — CBC WITH DIFFERENTIAL/PLATELET
Basophils Absolute: 0.1 10*3/uL (ref 0.0–0.1)
Basophils Relative: 0.6 % (ref 0.0–3.0)
Eosinophils Absolute: 0.2 10*3/uL (ref 0.0–0.7)
Eosinophils Relative: 2 % (ref 0.0–5.0)
HCT: 34.3 % — ABNORMAL LOW (ref 36.0–46.0)
Hemoglobin: 11.3 g/dL — ABNORMAL LOW (ref 12.0–15.0)
Lymphocytes Relative: 16.9 % (ref 12.0–46.0)
Lymphs Abs: 1.7 10*3/uL (ref 0.7–4.0)
MCHC: 32.8 g/dL (ref 30.0–36.0)
MCV: 92.3 fl (ref 78.0–100.0)
Monocytes Absolute: 1 10*3/uL (ref 0.1–1.0)
Monocytes Relative: 9.5 % (ref 3.0–12.0)
Neutro Abs: 7.4 10*3/uL (ref 1.4–7.7)
Neutrophils Relative %: 71 % (ref 43.0–77.0)
Platelets: 321 10*3/uL (ref 150.0–400.0)
RBC: 3.72 Mil/uL — ABNORMAL LOW (ref 3.87–5.11)
RDW: 13.6 % (ref 11.5–15.5)
WBC: 10.4 10*3/uL (ref 4.0–10.5)

## 2021-12-28 LAB — LIPID PANEL
Cholesterol: 165 mg/dL (ref 0–200)
HDL: 89.7 mg/dL (ref >39.00)
LDL Cholesterol: 60 mg/dL (ref 0–99)
NonHDL: 75.18
Total CHOL/HDL Ratio: 2
Triglycerides: 76 mg/dL (ref 0.0–149.0)
VLDL: 15.2 mg/dL (ref 0.0–40.0)

## 2021-12-28 LAB — COMPREHENSIVE METABOLIC PANEL
ALT: 12 U/L (ref 0–35)
AST: 17 U/L (ref 0–37)
Albumin: 3.7 g/dL (ref 3.5–5.2)
Alkaline Phosphatase: 49 U/L (ref 39–117)
BUN: 21 mg/dL (ref 6–23)
CO2: 28 mEq/L (ref 19–32)
Calcium: 9.4 mg/dL (ref 8.4–10.5)
Chloride: 102 mEq/L (ref 96–112)
Creatinine, Ser: 1.05 mg/dL (ref 0.40–1.20)
GFR: 48.15 mL/min — ABNORMAL LOW (ref >60.00)
Glucose, Bld: 125 mg/dL — ABNORMAL HIGH (ref 70–99)
Potassium: 4.4 mEq/L (ref 3.5–5.1)
Sodium: 137 mEq/L (ref 135–145)
Total Bilirubin: 0.5 mg/dL (ref 0.2–1.2)
Total Protein: 6.9 g/dL (ref 6.0–8.3)

## 2021-12-28 NOTE — Patient Instructions (Addendum)
Health Maintenance Due  Topic Date Due   Zoster Vaccines- Shingrix (1 of 2) - Please consider getting your shingles shot at your local pharmacy-if received, please let us know.  Never done   Please try a heating pad on bruise on right thigh since warm shower helps  Please stop by lab before you go If you have mychart- we will send your results within 3 business days of [place] receiving them.  If you do not have mychart- we will call you about results within 5 business days of [place] receiving them.  *please also note that you will see labs on mychart as soon as they post. I will later go in and write notes on them- will say "notes from Dr. Yong Channel"  Recommended follow up: Return in about 6 months (around 06/27/2022) for follow up- or sooner if needed.

## 2021-12-30 ENCOUNTER — Ambulatory Visit: Payer: Medicare Other | Admitting: Family Medicine

## 2022-01-03 NOTE — Progress Notes (Signed)
Patient ID: Bailey [name], female   DOB: 06-Dec-1932, 88 y.o.   MRN: [id number]     88 y.o. history of PVC;s, HTN and moderate AR, Raynaud's and Sjorgrens Sees dermatology for chronic lichen sclerosis    Home BP readings good and cuff accurate  Compliant with meds Some fatigue    Now living at [facility] as she had been in home for 46 years Husbands Parkinsons confines her quite a bit   TTE done 12/26/20 showed moderate AR with normal LV size and EF 60-65%   Caring for husband with bulbar palsy very difficult She does have some help now   ROS: Denies fever, malais, weight loss, blurry vision, decreased visual acuity, cough, sputum, SOB, hemoptysis, pleuritic pain, palpitaitons, heartburn, abdominal pain, melena, lower extremity edema, claudication, or rash.  All other systems reviewed and negative  General: Vitals:   01/11/22 0951  BP: 136/70  Pulse: 64  SpO2: 97%     Affect appropriate Elderly female  HEENT: normal Neck supple with no adenopathy JVP normal no bruits no thyromegaly Lungs clear with no wheezing and good diaphragmatic motion Heart:  S1/S2 SEM and AR murmur, no rub, gallop or click PMI normal Abdomen: benighn, BS positve, no tenderness, no AAA no bruit.  No HSM or HJR Distal pulses intact with no bruits No edema Neuro non-focal No muscular weakness Healing ulcer right ankle    Current Outpatient Medications  Medication Sig Dispense Refill   aspirin 81 MG tablet Take 81 mg by mouth daily.     carvedilol (COREG) 3.125 MG tablet TAKE 2 TABLETS BY MOUTH IN THE AM AND 1 TABLET BY MOUTH IN THE EVENING. 270 tablet 0   Cholecalciferol (VITAMIN D) 400 UNITS capsule Take 2 capsules by mouth daily     cycloSPORINE (RESTASIS) 0.05 % ophthalmic emulsion Place 1 drop into both eyes 2 (two) times daily.     escitalopram (LEXAPRO) 10 MG tablet Take 1 tablet (10 mg total) by mouth daily. 90 tablet 3   estrogen, conjugated,-medroxyprogesterone (PREMPRO) 0.45-1.5 MG  tablet Take 1 tablet by mouth daily. 84 tablet 4   fish oil-omega-3 fatty acids 1000 MG capsule Take one capsule by mouth  daily     losartan (COZAAR) 100 MG tablet Take 1 tablet (100 mg total) by mouth daily. 90 tablet 3   busPIRone (BUSPAR) 5 MG tablet TAKE 1 TABLET BY MOUTH TWICE A DAY AS NEEDED FOR ANXIETY (Patient not taking: Reported on 11/05/2021) 180 tablet 1   ondansetron (ZOFRAN ODT) 4 MG disintegrating tablet Take 1 tablet (4 mg total) by mouth every 8 (eight) hours as needed for nausea or vomiting. (Patient not taking: Reported on 10/21/2021) 20 tablet 0   No current facility-administered medications for this visit.    Allergies  Diamox [acetazolamide], Epinephrine-lidocaine-na metabisulfite [lidocaine-epinephrine], Erythromycin, Penicillins, Sulfonamide derivatives, and Tape  Electrocardiogram:  01/11/2022 nonspecific ST changes 01/11/2022  SR rate 63 PaC low atrial foci LAD   Assessment and Plan Palpitations:  PAC;s /PVC;s  Stable on beta blocker   AR:   Moderate compensated LV by TTE 12/26/20 on ARB  HTN:  ARB and beta blocker increased March 2019   Edema:  Venous wearing compression stockings improved f/u dermatology   Chest pain:  Improved normal myovue 04/05/16  observe   Anxiety:  Husbands health and recent move to [place] f/u primary Lexapro and Buspar   Derm/Sjogrens:  Consider 2nd opinion regarding non healing lichen lesion on right ankle Restasis eye drops  F/U  In a year     Time spent on reviewing chart, writing note and direct patient interview via video 20 minutes   Jenkins Rouge

## 2022-01-06 ENCOUNTER — Other Ambulatory Visit: Payer: Self-pay

## 2022-01-06 ENCOUNTER — Ambulatory Visit (HOSPITAL_COMMUNITY): Payer: Medicare Other | Attending: Cardiology

## 2022-01-06 DIAGNOSIS — I351 Nonrheumatic aortic (valve) insufficiency: Secondary | ICD-10-CM | POA: Insufficient documentation

## 2022-01-06 LAB — ECHOCARDIOGRAM COMPLETE
Area-P 1/2: 4.1 cm2
P 1/2 time: 420 msec
S' Lateral: 1.7 cm

## 2022-01-07 NOTE — Progress Notes (Signed)
Office Visit Note  Patient: Bailey [name]             Date of Birth: 20-Jun-1933           MRN: [id number]             PCP: Marin Olp, MD Referring: Marin Olp, MD Visit Date: 01/20/2022 Occupation: @GUAROCC @  Subjective:  Dry mouth, dry eyes, pain in feet  History of Present Illness: Bailey [name] is a 88 y.o. female with history of sicca symptoms Raynauds, osteoarthritis.  She states she continues to have dry mouth and dry eyes.  Raynaud's has not been very bothersome.  She continues to have discomfort in her bilateral hands and her bilateral feet.  She states she has been helping her husband a lot because he has Parkinson's disease.  She states her husband fell several times on her which made her fall.  She recently tripped on her feet and also lost balance and fell.  She had moderate injuries.  She has some bruising over the right trochanter area.  She has been going to the gym on a regular basis.  Activities of Daily Living:  Patient reports morning stiffness for 5-10  minutes.   Patient Denies nocturnal pain.  Difficulty dressing/grooming: Denies Difficulty climbing stairs: Denies Difficulty getting out of chair: Denies Difficulty using hands for taps, buttons, cutlery, and/or writing: Denies  Review of Systems  Constitutional:  Positive for fatigue.  HENT:  Positive for mouth sores and mouth dryness. Negative for nose dryness.   Eyes:  Positive for dryness. Negative for pain and itching.  Respiratory:  Negative for shortness of breath and difficulty breathing.   Cardiovascular:  Positive for palpitations. Negative for chest pain.  Gastrointestinal:  Negative for blood in stool, constipation and diarrhea.  Endocrine: Negative for increased urination.  Genitourinary:  Negative for difficulty urinating.  Musculoskeletal:  Positive for morning stiffness. Negative for joint pain, joint pain, joint swelling, myalgias, muscle tenderness and myalgias.  Skin:  Positive for  color change. Negative for rash and redness.  Allergic/Immunologic: Negative for susceptible to infections.  Neurological:  Positive for dizziness. Negative for numbness, headaches, memory loss and weakness.  Hematological:  Positive for bruising/bleeding tendency.  Psychiatric/Behavioral:  Negative for confusion.    PMFS History:  Patient Active Problem List   Diagnosis Date Noted   Dry eyes, bilateral 05/15/2018   Lumbar radiculopathy 12/12/2017   Allergy    CKD (chronic kidney disease), stage III ([place]) 12/08/2017   Moderate aortic regurgitation 12/08/2017   Premature ventricular contractions    Hepatitis    History of skin cancer    Sjogren's syndrome with keratoconjunctivitis sicca ([place]) 05/12/2017   Balance disorder 10/26/2016   Primary osteoarthritis of both feet 10/26/2016   HTN (hypertension) 03/13/2015   Anemia 03/13/2015   Raynauds syndrome 10/21/2014   Recurrent cold sores 10/21/2014   Edema 06/29/2011   Primary osteoarthritis of both hands 08/21/2009   PVC (premature ventricular contraction) 06/04/2009   Mitral valve prolapse 12/25/2008   Paroxysmal atrial fibrillation ([place]) 09/05/2008   Osteopenia 09/05/2008   GERD 09/04/2008   Irritable bowel syndrome 09/04/2008   ALLERGY 09/04/2008   COLONIC POLYPS 09/04/2008    Past Medical History:  Diagnosis Date   Allergy, unspecified not elsewhere classified    Anemia, unspecified    Benign neoplasm of colon    Cancer ([place])    basal/sqamous/transitional melanoma   CKD (chronic kidney disease), stage III ([place]) 12/08/2017  COLONIC POLYPS 09/04/2008   Was told by Gi does not need anymore.     Diverticulosis of colon (without mention of hemorrhage)    Dry eye syndrome    Dysrhythmia    PVCs   Esophageal reflux    Hepatitis 1950s   infectious from food   History of atrial fibrillation    Hypertension    Irritable bowel syndrome    Mitral valve disorders(424.0)    Moderate aortic regurgitation 12/08/2017    Osteopenia 06/2018   T score -1.8 FRAX 10% / 2.5%   Palpitations    Premature ventricular contractions    Rheumatoid arthritis(714.0)    Sicca syndrome ([place])    Ulcer 1992   Vaginal delivery 1959, 1960, 1962, 1964    Family History  Problem Relation Age of Onset   Stroke Mother    Hypertension Mother    Ulcerative colitis Father    Heart attack Father    Breast cancer Maternal Grandmother        Age 48's   Uterine cancer Maternal Grandmother    Hypertension Maternal Grandfather    Colon cancer Paternal Grandmother    Past Surgical History:  Procedure Laterality Date   APPENDECTOMY     CATARACT EXTRACTION     CHOLECYSTECTOMY     colon tumor  1969   Benign rectal tumor excised   DILATATION & CURETTAGE/HYSTEROSCOPY WITH MYOSURE N/A 01/21/2015   Procedure: DILATATION & CURETTAGE/HYSTEROSCOPY WITH MYOSURE;  Surgeon: [name], MD;  Location: [facility];  Service: Gynecology;  Laterality: N/A;   HIATAL HERNIA REPAIR     REFRACTIVE SURGERY     ROTATOR CUFF REPAIR  2009   right by Dr. French Ana   SKIN BIOPSY  11/2020   squamous cell removed  2013   Dr. Sherrye Payor --removed from her right cheek   TUBAL LIGATION     Social History   Social History Narrative   Married 1958. 4 children. 10 grandkids (1 trying to go to medical school). No greatgrandkids.       Retired from nursing (medical and surgical, most recently in cardiology)   Degree in history and english after finished nursing- 2 mo before 72th birthday      Hobbies: education, travel, reading, piano    Immunization History  Administered Date(s) Administered   Fluad Quad(high Dose 65+) 08/21/2019   H1N1 11/26/2008   Influenza Split 08/30/2011, 10/31/2012   Influenza Whole 09/05/2008, 08/27/2009, 08/20/2010   Influenza, High Dose Seasonal PF 08/31/2016, 09/05/2017, 08/30/2018   Influenza,inj,Quad PF,6+ Mos 09/13/2013, 10/04/2014, 10/01/2015   Influenza-Unspecified 08/26/2020, 09/16/2021   Moderna Covid-19 Vaccine  Bivalent Booster 19yrs & up 12/03/2021   Moderna SARS-COV2 Booster Vaccination 03/30/2021   Moderna Sars-Covid-2 Vaccination 12/20/2019, 01/17/2020, 10/02/2020   Pneumococcal Conjugate-13 10/21/2014   Pneumococcal Polysaccharide-23 10/11/2015   Td 02/17/2010   Tdap 03/02/2021   Zoster, Live 11/29/2008     Objective: Vital Signs: BP (!) 132/55 (BP Location: Left Arm, Patient Position: Sitting, Cuff Size: Normal)    Pulse 69    Ht 5\' 2"  (1.575 m)    Wt 107 lb 9.6 oz (48.8 kg)    LMP  (LMP Unknown)    BMI 19.68 kg/m    Physical Exam Vitals and nursing note reviewed.  Constitutional:      Appearance: She is well-developed.  HENT:     Head: Normocephalic and atraumatic.  Eyes:     Conjunctiva/sclera: Conjunctivae normal.  Cardiovascular:     Rate and Rhythm: Normal rate  and regular rhythm.     Heart sounds: Normal heart sounds.  Pulmonary:     Effort: Pulmonary effort is normal.     Breath sounds: Normal breath sounds.  Abdominal:     General: Bowel sounds are normal.     Palpations: Abdomen is soft.  Musculoskeletal:     Cervical back: Normal range of motion.  Lymphadenopathy:     Cervical: No cervical adenopathy.  Skin:    General: Skin is warm and dry.     Capillary Refill: Capillary refill takes less than 2 seconds.  Neurological:     Mental Status: She is alert and oriented to person, place, and time.     Comments: Shuffling gait  Psychiatric:        Behavior: Behavior normal.     Musculoskeletal Exam: C-spine was in limited range of motion without discomfort.  Shoulder joints, elbow joints, wrist joints were in good range of motion.  She had no synovitis on examination.  PIP and DIP thickening was noted.  Hip joints and knee joints in good range of motion.  She had no tenderness over ankles or MTPs.  CDAI Exam: CDAI Score: -- Patient Global: --; Provider Global: -- Swollen: --; Tender: -- Joint Exam 01/20/2022   No joint exam has been documented for this visit    There is currently no information documented on the homunculus. Go to the Rheumatology activity and complete the homunculus joint exam.  Investigation: No additional findings.  Imaging: ECHOCARDIOGRAM COMPLETE  Result Date: 01/06/2022    ECHOCARDIOGRAM REPORT   Patient Name:   Bailey [name]   Date of Exam: 01/06/2022 Medical Rec #:  [id number]     Height:       61.0 in Accession #:    [id number]    Weight:       105.6 lb Date of Birth:  [birth year]     BSA:          1.440 m Patient Age:    76 years      BP:           110/60 mmHg Patient Gender: F             HR:           63 bpm. [facility] Procedure: 2D Echo, Cardiac Doppler, Color Doppler and 3D Echo Indications:    I35.1 AI  History:        Patient has prior history of Echocardiogram examinations, most                 recent 12/26/2020. AI, Arrythmias:Paroxysmal atrial fibrillation;                 Risk Factors:Hypertension.  Sonographer:    Coralyn Helling RDCS Referring Phys: [place]  Sonographer Comments: Global longitudinal strain was attempted. IMPRESSIONS  1. Left ventricular ejection fraction, by estimation, is 60 to 65%. The left ventricle has normal function. The left ventricle has no regional wall motion abnormalities. Left ventricular diastolic parameters are consistent with Grade II diastolic dysfunction (pseudonormalization).  2. Right ventricular systolic function is normal. The right ventricular size is normal. There is moderately elevated pulmonary artery systolic pressure. The estimated right ventricular systolic pressure is 16.3 mmHg.  3. The mitral valve is normal in structure. Mild mitral valve regurgitation. No evidence of mitral stenosis.  4. The aortic valve is tricuspid. Aortic valve regurgitation is moderate. No aortic stenosis is present.  5.  The inferior vena cava is normal in size with greater than 50% respiratory variability, suggesting right atrial pressure of 3 mmHg. FINDINGS  Left Ventricle: Left  ventricular ejection fraction, by estimation, is 60 to 65%. The left ventricle has normal function. The left ventricle has no regional wall motion abnormalities. The left ventricular internal cavity size was normal in size. There is  no left ventricular hypertrophy. Left ventricular diastolic parameters are consistent with Grade II diastolic dysfunction (pseudonormalization). Right Ventricle: The right ventricular size is normal. No increase in right ventricular wall thickness. Right ventricular systolic function is normal. There is moderately elevated pulmonary artery systolic pressure. The tricuspid regurgitant velocity is 3.27 m/s, and with an assumed right atrial pressure of 3 mmHg, the estimated right ventricular systolic pressure is 00.1 mmHg. Left Atrium: Left atrial size was normal in size. Right Atrium: Right atrial size was normal in size. Pericardium: There is no evidence of pericardial effusion. Mitral Valve: The mitral valve is normal in structure. Mild mitral valve regurgitation. No evidence of mitral valve stenosis. Tricuspid Valve: The tricuspid valve is normal in structure. Tricuspid valve regurgitation is mild. Aortic Valve: The aortic valve is tricuspid. Aortic valve regurgitation is moderate. Aortic regurgitation PHT measures 420 msec. No aortic stenosis is present. Pulmonic Valve: The pulmonic valve was normal in structure. Pulmonic valve regurgitation is mild. Aorta: The aortic root is normal in size and structure. Venous: The inferior vena cava is normal in size with greater than 50% respiratory variability, suggesting right atrial pressure of 3 mmHg. IAS/Shunts: No atrial level shunt detected by color flow Doppler.  LEFT VENTRICLE PLAX 2D LVIDd:         3.50 cm   Diastology LVIDs:         1.70 cm   LV e' medial:    7.83 cm/s LV PW:         1.00 cm   LV E/e' medial:  16.6 LV IVS:        0.80 cm   LV e' lateral:   12.10 cm/s LVOT diam:     1.80 cm   LV E/e' lateral: 10.7 LV SV:         61 LV SV  Index:   42 LVOT Area:     2.54 cm                           3D Volume EF:                          3D EF:        60 %                          LV EDV:       73 ml                          LV ESV:       30 ml                          LV SV:        44 ml RIGHT VENTRICLE             IVC RV S prime:     15.00 cm/s  IVC diam: 0.80 cm TAPSE (M-mode): 2.0 cm RVSP:  45.8 mmHg LEFT ATRIUM             Index        RIGHT ATRIUM           Index LA diam:        3.20 cm 2.22 cm/m   RA Pressure: 3.00 mmHg LA Vol (A2C):   41.1 ml 28.55 ml/m  RA Area:     12.00 cm LA Vol (A4C):   23.1 ml 16.04 ml/m  RA Volume:   25.50 ml  17.71 ml/m LA Biplane Vol: 32.1 ml 22.30 ml/m  AORTIC VALVE LVOT Vmax:   101.00 cm/s LVOT Vmean:  67.200 cm/s LVOT VTI:    0.240 m AI PHT:      420 msec  AORTA Ao Root diam: 3.10 cm Ao Asc diam:  3.10 cm MITRAL VALVE                TRICUSPID VALVE MV Area (PHT): 4.10 cm     TR Peak grad:   42.8 mmHg MV Decel Time: 185 msec     TR Vmax:        327.00 cm/s MV E velocity: 130.00 cm/s  Estimated RAP:  3.00 mmHg MV A velocity: 81.20 cm/s   RVSP:           45.8 mmHg MV E/A ratio:  1.60                             SHUNTS                             Systemic VTI:  0.24 m                             Systemic Diam: 1.80 cm Franki Monte Electronically signed by Franki Monte Signature Date/Time: 01/06/2022/4:35:03 PM    Final     Recent Labs: Lab Results  Component Value Date   WBC 10.4 12/28/2021   HGB 11.3 (L) 12/28/2021   PLT 321.0 12/28/2021   NA 137 12/28/2021   K 4.4 12/28/2021   CL 102 12/28/2021   CO2 28 12/28/2021   GLUCOSE 125 (H) 12/28/2021   BUN 21 12/28/2021   CREATININE 1.05 12/28/2021   BILITOT 0.5 12/28/2021   ALKPHOS 49 12/28/2021   AST 17 12/28/2021   ALT 12 12/28/2021   PROT 6.9 12/28/2021   ALBUMIN 3.7 12/28/2021   CALCIUM 9.4 12/28/2021   GFRAA 61 11/14/2018    Speciality Comments: No specialty comments available.  Procedures:  No procedures  performed Allergies: Diamox [acetazolamide], Epinephrine-lidocaine-na metabisulfite [lidocaine-epinephrine], Erythromycin, Penicillins, Sulfonamide derivatives, and Tape   Assessment / Plan:     Visit Diagnoses: Sicca syndrome-patient had negative ANA in 2010 and positive rheumatoid factor.  Rheumatoid factor became negative later.-RF,-SPEP 11/17: She continues to have dry mouth and dry eyes.  Over-the-counter products were discussed.  She recently had labs which were within normal limits.  I will consider obtaining SSA antibodies with her next blood work.  Raynaud's disease without gangrene-currently not active.  She had no nailbed capillary changes or sclerodactyly.  She had good capillary refill.  Chronic midline low back pain without sciatica-she is currently not having much lower back discomfort.  Primary osteoarthritis of both hands-she had bilateral PIP and DIP thickening with some discomfort off and on.  No synovitis was  noted.  Primary osteoarthritis of both feet-she has discomfort in her feet.  Proper fitting shoes were discussed.  Osteopenia of multiple sites - DEXA 08/05/2020 T-score: -1.6, BMD: 0.668 left femoral neck. DEXA 07/26/18 L femoral neck T-score -1.8.   Other fatigue-she continues to have fatigue which she relates to being under stress due to her husband's health.  History of hypertension  History of atrial fibrillation  History of anemia - Monitored by PCP.   Balance disorder-she has had balance issues for a while.  Lower extremity muscle strengthening exercises were discussed and a handout was given.  I also gave her a prescription for physical therapy.  History of recent fall-patient states that she is fallen several times as her husband fell on her.  She tripped once by herself.  I offered referral to neurology as she has some shuffling gait.  She declined referral.  I wrote a prescription for physical therapy for fall prevention.  History of IBS  History of  gastroesophageal reflux (GERD)  Orders: No orders of the defined types were placed in this encounter.  No orders of the defined types were placed in this encounter.    Follow-Up Instructions: Return for Raynauds, OA.   Bo Merino, MD  Note - This record has been created using Editor, commissioning.  Chart creation errors have been sought, but may not always  have been located. Such creation errors do not reflect on  the standard of medical care.

## 2022-01-11 ENCOUNTER — Encounter: Payer: Self-pay | Admitting: Cardiovascular Disease

## 2022-01-11 ENCOUNTER — Ambulatory Visit (INDEPENDENT_AMBULATORY_CARE_PROVIDER_SITE_OTHER): Payer: Medicare Other | Admitting: Cardiovascular Disease

## 2022-01-11 ENCOUNTER — Other Ambulatory Visit: Payer: Self-pay

## 2022-01-11 VITALS — BP 136/70 | HR 64 | Ht 62.0 in | Wt 106.0 lb

## 2022-01-11 DIAGNOSIS — I10 Essential (primary) hypertension: Secondary | ICD-10-CM | POA: Diagnosis not present

## 2022-01-11 DIAGNOSIS — I351 Nonrheumatic aortic (valve) insufficiency: Secondary | ICD-10-CM

## 2022-01-11 NOTE — Patient Instructions (Addendum)
Medication Instructions:  °Your physician recommends that you continue on your current medications as directed. Please refer to the Current Medication list given to you today. ° °*If you need a refill on your cardiac medications before your next appointment, please call your pharmacy* ° °Lab Work: °If you have labs (blood work) drawn today and your tests are completely normal, you will receive your results only by: °MyChart Message (if you have MyChart) OR °A paper copy in the mail °If you have any lab test that is abnormal or we need to change your treatment, we will call you to review the results. ° °Testing/Procedures: °None ordered today. ° °Follow-Up: °At [facility], you and your health needs are our priority.  As part of our continuing mission to provide you with exceptional heart care, we have created designated Provider Care Teams.  These Care Teams include your primary Cardiologist (physician) and Advanced Practice Providers (APPs -  Physician Assistants and Nurse Practitioners) who all work together to provide you with the care you need, when you need it. ° °We recommend signing up for the patient portal called "MyChart".  Sign up information is provided on this After Visit Summary.  MyChart is used to connect with patients for Virtual Visits (Telemedicine).  Patients are able to view lab/test results, encounter notes, upcoming appointments, etc.  Non-urgent messages can be sent to your provider as well.   °To learn more about what you can do with MyChart, go to [email or web address].   ° °Your next appointment:   °6 month(s) ° °The format for your next appointment:   °In Person ° °Provider:   °Peter Nishan, MD { ° ° °

## 2022-01-20 ENCOUNTER — Other Ambulatory Visit: Payer: Self-pay

## 2022-01-20 ENCOUNTER — Encounter: Payer: Self-pay | Admitting: Rheumatology

## 2022-01-20 ENCOUNTER — Ambulatory Visit (INDEPENDENT_AMBULATORY_CARE_PROVIDER_SITE_OTHER): Payer: Medicare Other | Admitting: Rheumatology

## 2022-01-20 VITALS — BP 132/55 | HR 69 | Ht 62.0 in | Wt 107.6 lb

## 2022-01-20 DIAGNOSIS — M19041 Primary osteoarthritis, right hand: Secondary | ICD-10-CM

## 2022-01-20 DIAGNOSIS — M19071 Primary osteoarthritis, right ankle and foot: Secondary | ICD-10-CM | POA: Diagnosis not present

## 2022-01-20 DIAGNOSIS — M8589 Other specified disorders of bone density and structure, multiple sites: Secondary | ICD-10-CM

## 2022-01-20 DIAGNOSIS — I7300 Raynaud's syndrome without gangrene: Secondary | ICD-10-CM

## 2022-01-20 DIAGNOSIS — M545 Low back pain, unspecified: Secondary | ICD-10-CM | POA: Diagnosis not present

## 2022-01-20 DIAGNOSIS — G8929 Other chronic pain: Secondary | ICD-10-CM

## 2022-01-20 DIAGNOSIS — M3500 Sicca syndrome, unspecified: Secondary | ICD-10-CM

## 2022-01-20 DIAGNOSIS — Z8679 Personal history of other diseases of the circulatory system: Secondary | ICD-10-CM

## 2022-01-20 DIAGNOSIS — Z9181 History of falling: Secondary | ICD-10-CM | POA: Diagnosis not present

## 2022-01-20 DIAGNOSIS — Z862 Personal history of diseases of the blood and blood-forming organs and certain disorders involving the immune mechanism: Secondary | ICD-10-CM | POA: Diagnosis not present

## 2022-01-20 DIAGNOSIS — M3501 Sicca syndrome with keratoconjunctivitis: Secondary | ICD-10-CM

## 2022-01-20 DIAGNOSIS — M19072 Primary osteoarthritis, left ankle and foot: Secondary | ICD-10-CM

## 2022-01-20 DIAGNOSIS — M19042 Primary osteoarthritis, left hand: Secondary | ICD-10-CM

## 2022-01-20 DIAGNOSIS — M7712 Lateral epicondylitis, left elbow: Secondary | ICD-10-CM

## 2022-01-20 DIAGNOSIS — R5383 Other fatigue: Secondary | ICD-10-CM

## 2022-01-20 DIAGNOSIS — R2689 Other abnormalities of gait and mobility: Secondary | ICD-10-CM | POA: Diagnosis not present

## 2022-01-20 DIAGNOSIS — Z8719 Personal history of other diseases of the digestive system: Secondary | ICD-10-CM

## 2022-01-20 NOTE — Patient Instructions (Signed)
Knee Exercises Ask your health care provider which exercises are safe for you. Do exercises exactly as told by your health care provider and adjust them as directed. It is normal to feel mild stretching, pulling, tightness, or discomfort as you do these exercises. Stop right away if you feel sudden pain or your pain gets worse. Do not begin these exercises until told by your health care provider. Stretching and range-of-motion exercises These exercises warm up your muscles and joints and improve the movement and flexibility of your knee. These exercises also help to relieve pain and swelling. Knee extension, prone  Lie on your abdomen (prone position) on a bed. Place your left / right knee just beyond the edge of the surface so your knee is not on the bed. You can put a towel under your left / right thigh just above your kneecap for comfort. Relax your leg muscles and allow gravity to straighten your knee (extension). You should feel a stretch behind your left / right knee. Hold this position for __________ seconds. Scoot up so your knee is supported between repetitions. Repeat __________ times. Complete this exercise __________ times a day. Knee flexion, active  Lie on your back with both legs straight. If this causes back discomfort, bend your left / right knee so your foot is flat on the floor. Slowly slide your left / right heel back toward your buttocks. Stop when you feel a gentle stretch in the front of your knee or thigh (flexion). Hold this position for __________ seconds. Slowly slide your left / right heel back to the starting position. Repeat __________ times. Complete this exercise __________ times a day. Quadriceps stretch, prone  Lie on your abdomen on a firm surface, such as a bed or padded floor. Bend your left / right knee and hold your ankle. If you cannot reach your ankle or pant leg, loop a belt around your foot and grab the belt instead. Gently pull your heel toward your  buttocks. Your knee should not slide out to the side. You should feel a stretch in the front of your thigh and knee (quadriceps). Hold this position for __________ seconds. Repeat __________ times. Complete this exercise __________ times a day. Hamstring, supine  Lie on your back (supine position). Loop a belt or towel over the ball of your left / right foot. The ball of your foot is on the walking surface, right under your toes. Straighten your left / right knee and slowly pull on the belt to raise your leg until you feel a gentle stretch behind your knee (hamstring). Do not let your knee bend while you do this. Keep your other leg flat on the floor. Hold this position for __________ seconds. Repeat __________ times. Complete this exercise __________ times a day. Strengthening exercises These exercises build strength and endurance in your knee. Endurance is the ability to use your muscles for a long time, even after they get tired. Quadriceps, isometric This exercise strengthens the muscles in front of your thigh (quadriceps) without moving your knee joint (isometric). Lie on your back with your left / right leg extended and your other knee bent. Put a rolled towel or small pillow under your knee if told by your health care provider. Slowly tense the muscles in the front of your left / right thigh. You should see your kneecap slide up toward your hip or see increased dimpling just above the knee. This motion will push the back of the knee toward the floor.   For __________ seconds, hold the muscle as tight as you can without increasing your pain. Relax the muscles slowly and completely. Repeat __________ times. Complete this exercise __________ times a day. Straight leg raises This exercise strengthens the muscles in front of your thigh (quadriceps) and the muscles that move your hips (hip flexors). Lie on your back with your left / right leg extended and your other knee bent. Tense the  muscles in the front of your left / right thigh. You should see your kneecap slide up or see increased dimpling just above the knee. Your thigh may even shake a bit. Keep these muscles tight as you raise your leg 4-6 inches (10-15 cm) off the floor. Do not let your knee bend. Hold this position for __________ seconds. Keep these muscles tense as you lower your leg. Relax your muscles slowly and completely after each repetition. Repeat __________ times. Complete this exercise __________ times a day. Hamstring, isometric  Lie on your back on a firm surface. Bend your left / right knee about __________ degrees. Dig your left / right heel into the surface as if you are trying to pull it toward your buttocks. Tighten the muscles in the back of your thighs (hamstring) to "dig" as hard as you can without increasing any pain. Hold this position for __________ seconds. Release the tension gradually and allow your muscles to relax completely for __________ seconds after each repetition. Repeat __________ times. Complete this exercise __________ times a day. Hamstring curls If told by your health care provider, do this exercise while wearing ankle weights. Begin with __________lb / kg weights. Then increase the weight by 1 lb (0.5 kg) increments. Do not wear ankle weights that are more than __________lb / kg. Lie on your abdomen with your legs straight. Bend your left / right knee as far as you can without feeling pain. Keep your hips flat against the floor. Hold this position for __________ seconds. Slowly lower your leg to the starting position. Repeat __________ times. Complete this exercise __________ times a day. Squats This exercise strengthens the muscles in front of your thigh and knee (quadriceps). Stand in front of a table, with your feet and knees pointing straight ahead. You may rest your hands on the table for balance but not for support. Slowly bend your knees and lower your hips like you  are going to sit in a chair. Keep your weight over your heels, not over your toes. Keep your lower legs upright so they are parallel with the table legs. Do not let your hips go lower than your knees. Do not bend lower than told by your health care provider. If your knee pain increases, do not bend as low. Hold the squat position for __________ seconds. Slowly push with your legs to return to standing. Do not use your hands to pull yourself to standing. Repeat __________ times. Complete this exercise __________ times a day. Wall slides This exercise strengthens the muscles in front of your thigh and knee (quadriceps). Lean your back against a smooth wall or door, and walk your feet out 18-24 inches (46-61 cm) from it. Place your feet hip-width apart. Slowly slide down the wall or door until your knees bend __________ degrees. Keep your knees over your heels, not over your toes. Keep your knees in line with your hips. Hold this position for __________ seconds. Repeat __________ times. Complete this exercise __________ times a day. Straight leg raises, side-lying This exercise strengthens the muscles that rotate   the leg at the hip and move it away from your body (hip abductors). Lie on your side with your left / right leg in the top position. Lie so your head, shoulder, knee, and hip line up. You may bend your bottom knee to help you keep your balance. Roll your hips slightly forward so your hips are stacked directly over each other and your left / right knee is facing forward. Leading with your heel, lift your top leg 4-6 inches (10-15 cm). You should feel the muscles in your outer hip lifting. Do not let your foot drift forward. Do not let your knee roll toward the ceiling. Hold this position for __________ seconds. Slowly return your leg to the starting position. Let your muscles relax completely after each repetition. Repeat __________ times. Complete this exercise __________ times a  day. Straight leg raises, prone This exercise stretches the muscles that move your hips away from the front of the pelvis (hip extensors). Lie on your abdomen on a firm surface. You can put a pillow under your hips if that is more comfortable. Tense the muscles in your buttocks and lift your left / right leg about 4-6 inches (10-15 cm). Keep your knee straight as you lift your leg. Hold this position for __________ seconds. Slowly lower your leg to the starting position. Let your leg relax completely after each repetition. Repeat __________ times. Complete this exercise __________ times a day. This information is not intended to replace advice given to you by your health care provider. Make sure you discuss any questions you have with your health care provider. Document Revised: 07/28/2021 Document Reviewed: 07/28/2021 Elsevier Patient Education  2022 Elsevier Inc.  

## 2022-02-03 DIAGNOSIS — R2681 Unsteadiness on feet: Secondary | ICD-10-CM | POA: Diagnosis not present

## 2022-02-03 DIAGNOSIS — M6281 Muscle weakness (generalized): Secondary | ICD-10-CM | POA: Diagnosis not present

## 2022-02-09 DIAGNOSIS — R2681 Unsteadiness on feet: Secondary | ICD-10-CM | POA: Diagnosis not present

## 2022-02-09 DIAGNOSIS — M6281 Muscle weakness (generalized): Secondary | ICD-10-CM | POA: Diagnosis not present

## 2022-02-11 DIAGNOSIS — R2681 Unsteadiness on feet: Secondary | ICD-10-CM | POA: Diagnosis not present

## 2022-02-11 DIAGNOSIS — M6281 Muscle weakness (generalized): Secondary | ICD-10-CM | POA: Diagnosis not present

## 2022-02-16 DIAGNOSIS — M6281 Muscle weakness (generalized): Secondary | ICD-10-CM | POA: Diagnosis not present

## 2022-02-16 DIAGNOSIS — R2681 Unsteadiness on feet: Secondary | ICD-10-CM | POA: Diagnosis not present

## 2022-02-18 DIAGNOSIS — R2681 Unsteadiness on feet: Secondary | ICD-10-CM | POA: Diagnosis not present

## 2022-02-18 DIAGNOSIS — M6281 Muscle weakness (generalized): Secondary | ICD-10-CM | POA: Diagnosis not present

## 2022-02-23 DIAGNOSIS — R2681 Unsteadiness on feet: Secondary | ICD-10-CM | POA: Diagnosis not present

## 2022-02-23 DIAGNOSIS — M6281 Muscle weakness (generalized): Secondary | ICD-10-CM | POA: Diagnosis not present

## 2022-02-25 DIAGNOSIS — M6281 Muscle weakness (generalized): Secondary | ICD-10-CM | POA: Diagnosis not present

## 2022-02-25 DIAGNOSIS — R2681 Unsteadiness on feet: Secondary | ICD-10-CM | POA: Diagnosis not present

## 2022-03-02 DIAGNOSIS — R2681 Unsteadiness on feet: Secondary | ICD-10-CM | POA: Diagnosis not present

## 2022-03-02 DIAGNOSIS — M6281 Muscle weakness (generalized): Secondary | ICD-10-CM | POA: Diagnosis not present

## 2022-03-04 DIAGNOSIS — R2681 Unsteadiness on feet: Secondary | ICD-10-CM | POA: Diagnosis not present

## 2022-03-04 DIAGNOSIS — M6281 Muscle weakness (generalized): Secondary | ICD-10-CM | POA: Diagnosis not present

## 2022-03-09 ENCOUNTER — Other Ambulatory Visit: Payer: Self-pay | Admitting: Cardiovascular Disease

## 2022-03-09 DIAGNOSIS — R2681 Unsteadiness on feet: Secondary | ICD-10-CM | POA: Diagnosis not present

## 2022-03-09 DIAGNOSIS — M6281 Muscle weakness (generalized): Secondary | ICD-10-CM | POA: Diagnosis not present

## 2022-03-11 DIAGNOSIS — M6281 Muscle weakness (generalized): Secondary | ICD-10-CM | POA: Diagnosis not present

## 2022-03-11 DIAGNOSIS — R2681 Unsteadiness on feet: Secondary | ICD-10-CM | POA: Diagnosis not present

## 2022-03-16 DIAGNOSIS — R2681 Unsteadiness on feet: Secondary | ICD-10-CM | POA: Diagnosis not present

## 2022-03-16 DIAGNOSIS — M6281 Muscle weakness (generalized): Secondary | ICD-10-CM | POA: Diagnosis not present

## 2022-03-18 DIAGNOSIS — M6281 Muscle weakness (generalized): Secondary | ICD-10-CM | POA: Diagnosis not present

## 2022-03-18 DIAGNOSIS — R2681 Unsteadiness on feet: Secondary | ICD-10-CM | POA: Diagnosis not present

## 2022-03-23 DIAGNOSIS — M6281 Muscle weakness (generalized): Secondary | ICD-10-CM | POA: Diagnosis not present

## 2022-03-23 DIAGNOSIS — R2681 Unsteadiness on feet: Secondary | ICD-10-CM | POA: Diagnosis not present

## 2022-03-24 DIAGNOSIS — Z20822 Contact with and (suspected) exposure to covid-19: Secondary | ICD-10-CM | POA: Diagnosis not present

## 2022-03-25 DIAGNOSIS — R2681 Unsteadiness on feet: Secondary | ICD-10-CM | POA: Diagnosis not present

## 2022-03-25 DIAGNOSIS — M6281 Muscle weakness (generalized): Secondary | ICD-10-CM | POA: Diagnosis not present

## 2022-03-30 DIAGNOSIS — M6281 Muscle weakness (generalized): Secondary | ICD-10-CM | POA: Diagnosis not present

## 2022-03-30 DIAGNOSIS — R2681 Unsteadiness on feet: Secondary | ICD-10-CM | POA: Diagnosis not present

## 2022-04-01 ENCOUNTER — Other Ambulatory Visit: Payer: Self-pay | Admitting: Cardiovascular Disease

## 2022-04-01 DIAGNOSIS — M6281 Muscle weakness (generalized): Secondary | ICD-10-CM | POA: Diagnosis not present

## 2022-04-01 DIAGNOSIS — R2681 Unsteadiness on feet: Secondary | ICD-10-CM | POA: Diagnosis not present

## 2022-04-06 DIAGNOSIS — M6281 Muscle weakness (generalized): Secondary | ICD-10-CM | POA: Diagnosis not present

## 2022-04-06 DIAGNOSIS — R2681 Unsteadiness on feet: Secondary | ICD-10-CM | POA: Diagnosis not present

## 2022-04-08 DIAGNOSIS — R2681 Unsteadiness on feet: Secondary | ICD-10-CM | POA: Diagnosis not present

## 2022-04-08 DIAGNOSIS — M6281 Muscle weakness (generalized): Secondary | ICD-10-CM | POA: Diagnosis not present

## 2022-04-13 DIAGNOSIS — R2681 Unsteadiness on feet: Secondary | ICD-10-CM | POA: Diagnosis not present

## 2022-04-13 DIAGNOSIS — M6281 Muscle weakness (generalized): Secondary | ICD-10-CM | POA: Diagnosis not present

## 2022-05-24 DIAGNOSIS — N189 Chronic kidney disease, unspecified: Secondary | ICD-10-CM | POA: Diagnosis not present

## 2022-05-24 DIAGNOSIS — M79675 Pain in left toe(s): Secondary | ICD-10-CM | POA: Diagnosis not present

## 2022-05-24 DIAGNOSIS — B351 Tinea unguium: Secondary | ICD-10-CM | POA: Diagnosis not present

## 2022-05-24 DIAGNOSIS — M2011 Hallux valgus (acquired), right foot: Secondary | ICD-10-CM | POA: Diagnosis not present

## 2022-06-02 DIAGNOSIS — Z961 Presence of intraocular lens: Secondary | ICD-10-CM | POA: Diagnosis not present

## 2022-06-02 DIAGNOSIS — H04123 Dry eye syndrome of bilateral lacrimal glands: Secondary | ICD-10-CM | POA: Diagnosis not present

## 2022-06-30 ENCOUNTER — Telehealth: Payer: Self-pay | Admitting: Cardiovascular Disease

## 2022-06-30 DIAGNOSIS — I351 Nonrheumatic aortic (valve) insufficiency: Secondary | ICD-10-CM

## 2022-06-30 NOTE — Telephone Encounter (Signed)
Called patient back to let her know she had an echo in February and according to Dr. Kyla Balzarine result note, she needs another echo in February 2024. Will place order and have the echo dept get patient scheduled in February.

## 2022-06-30 NOTE — Telephone Encounter (Signed)
Patient called and mentioned that she usually get a test done once a year because scheduling appt with Dr. Johnsie Cancel. Did not see order for test in EPIC

## 2022-07-07 ENCOUNTER — Ambulatory Visit: Payer: Medicare Other | Admitting: Family Medicine

## 2022-07-08 NOTE — Progress Notes (Deleted)
Office Visit Note  Patient: Bailey [name]             Date of Birth: 1933/11/10           MRN: [id number]             PCP: Marin Olp, MD Referring: Marin Olp, MD Visit Date: 07/21/2022 Occupation: '@GUAROCC'$ @  Subjective:    History of Present Illness: Bailey [name] is a 88 y.o. female with history of sicca syndrome, osteoarthritis, and Raynaud's phenomenon.  Activities of Daily Living:  Patient reports morning stiffness for *** {minute/hour:19697}.   Patient {ACTIONS;DENIES/REPORTS:21021675::"Denies"} nocturnal pain.  Difficulty dressing/grooming: {ACTIONS;DENIES/REPORTS:21021675::"Denies"} Difficulty climbing stairs: {ACTIONS;DENIES/REPORTS:21021675::"Denies"} Difficulty getting out of chair: {ACTIONS;DENIES/REPORTS:21021675::"Denies"} Difficulty using hands for taps, buttons, cutlery, and/or writing: {ACTIONS;DENIES/REPORTS:21021675::"Denies"}  No Rheumatology ROS completed.   PMFS History:  Patient Active Problem List   Diagnosis Date Noted  . Dry eyes, bilateral 05/15/2018  . Lumbar radiculopathy 12/12/2017  . Allergy   . CKD (chronic kidney disease), stage III ([place]) 12/08/2017  . Moderate aortic regurgitation 12/08/2017  . Premature ventricular contractions   . Hepatitis   . History of skin cancer   . Sjogren's syndrome with keratoconjunctivitis sicca ([place]) 05/12/2017  . Balance disorder 10/26/2016  . Primary osteoarthritis of both feet 10/26/2016  . HTN (hypertension) 03/13/2015  . Anemia 03/13/2015  . Raynauds syndrome 10/21/2014  . Recurrent cold sores 10/21/2014  . Edema 06/29/2011  . Primary osteoarthritis of both hands 08/21/2009  . PVC (premature ventricular contraction) 06/04/2009  . Mitral valve prolapse 12/25/2008  . Paroxysmal atrial fibrillation ([place]) 09/05/2008  . Osteopenia 09/05/2008  . GERD 09/04/2008  . Irritable bowel syndrome 09/04/2008  . ALLERGY 09/04/2008  . COLONIC POLYPS 09/04/2008    Past Medical History:  Diagnosis  Date  . Allergy, unspecified not elsewhere classified   . Anemia, unspecified   . Benign neoplasm of colon   . Cancer [facility])    basal/sqamous/transitional melanoma  . CKD (chronic kidney disease), stage III ([place]) 12/08/2017  . COLONIC POLYPS 09/04/2008   Was told by Gi does not need anymore.    . Diverticulosis of colon (without mention of hemorrhage)   . Dry eye syndrome   . Dysrhythmia    PVCs  . Esophageal reflux   . Hepatitis 1950s   infectious from food  . History of atrial fibrillation   . Hypertension   . Irritable bowel syndrome   . Mitral valve disorders(424.0)   . Moderate aortic regurgitation 12/08/2017  . Osteopenia 06/2018   T score -1.8 FRAX 10% / 2.5%  . Palpitations   . Premature ventricular contractions   . Rheumatoid arthritis(714.0)   . Sicca syndrome ([place])   . Ulcer 1992  . Vaginal delivery 1959, 1960, 1962, 1964    Family History  Problem Relation Age of Onset  . Stroke Mother   . Hypertension Mother   . Ulcerative colitis Father   . Heart attack Father   . Breast cancer Maternal Grandmother        Age 87's  . Uterine cancer Maternal Grandmother   . Hypertension Maternal Grandfather   . Colon cancer Paternal Grandmother    Past Surgical History:  Procedure Laterality Date  . APPENDECTOMY    . CATARACT EXTRACTION    . CHOLECYSTECTOMY    . colon tumor  1969   Benign rectal tumor excised  . DILATATION & CURETTAGE/HYSTEROSCOPY WITH MYOSURE N/A 01/21/2015   Procedure: DILATATION & CURETTAGE/HYSTEROSCOPY WITH MYOSURE;  Surgeon: Christia Reading  Corey Skains, MD;  Location: [facility];  Service: Gynecology;  Laterality: N/A;  . HIATAL HERNIA REPAIR    . REFRACTIVE SURGERY    . ROTATOR CUFF REPAIR  2009   right by Dr. French Ana  . SKIN BIOPSY  11/2020  . squamous cell removed  2013   Dr. Sherrye Payor --removed from her right cheek  . TUBAL LIGATION     Social History   Social History Narrative   Married 1958. 4 children. 10 grandkids (1 trying to go to medical  school). No greatgrandkids.       Retired from nursing (medical and surgical, most recently in cardiology)   Degree in history and english after finished nursing- 2 mo before 72th birthday      Hobbies: education, travel, reading, piano    Immunization History  Administered Date(s) Administered  . Fluad Quad(high Dose 65+) 08/21/2019  . H1N1 11/26/2008  . Influenza Split 08/30/2011, 10/31/2012  . Influenza Whole 09/05/2008, 08/27/2009, 08/20/2010  . Influenza, High Dose Seasonal PF 08/31/2016, 09/05/2017, 08/30/2018  . Influenza,inj,Quad PF,6+ Mos 09/13/2013, 10/04/2014, 10/01/2015  . Influenza-Unspecified 08/26/2020, 09/16/2021  . Moderna Covid-19 Vaccine Bivalent Booster 35yr & up 12/03/2021  . Moderna SARS-COV2 Booster Vaccination 03/30/2021  . Moderna Sars-Covid-2 Vaccination 12/20/2019, 01/17/2020, 10/02/2020  . Pneumococcal Conjugate-13 10/21/2014  . Pneumococcal Polysaccharide-23 10/11/2015  . Td 02/17/2010  . Tdap 03/02/2021  . Zoster, Live 11/29/2008     Objective: Vital Signs: LMP  (LMP Unknown)    Physical Exam Vitals and nursing note reviewed.  Constitutional:      Appearance: She is well-developed.  HENT:     Head: Normocephalic and atraumatic.  Eyes:     Conjunctiva/sclera: Conjunctivae normal.  Cardiovascular:     Rate and Rhythm: Normal rate and regular rhythm.     Heart sounds: Normal heart sounds.  Pulmonary:     Effort: Pulmonary effort is normal.     Breath sounds: Normal breath sounds.  Abdominal:     General: Bowel sounds are normal.     Palpations: Abdomen is soft.  Musculoskeletal:     Cervical back: Normal range of motion.  Skin:    General: Skin is warm and dry.     Capillary Refill: Capillary refill takes less than 2 seconds.  Neurological:     Mental Status: She is alert and oriented to person, place, and time.  Psychiatric:        Behavior: Behavior normal.     Musculoskeletal Exam: ***  CDAI Exam: CDAI Score: -- Patient  Global: --; Provider Global: -- Swollen: --; Tender: -- Joint Exam 07/21/2022   No joint exam has been documented for this visit   There is currently no information documented on the homunculus. Go to the Rheumatology activity and complete the homunculus joint exam.  Investigation: No additional findings.  Imaging: No results found.  Recent Labs: Lab Results  Component Value Date   WBC 10.4 12/28/2021   HGB 11.3 (L) 12/28/2021   PLT 321.0 12/28/2021   NA 137 12/28/2021   K 4.4 12/28/2021   CL 102 12/28/2021   CO2 28 12/28/2021   GLUCOSE 125 (H) 12/28/2021   BUN 21 12/28/2021   CREATININE 1.05 12/28/2021   BILITOT 0.5 12/28/2021   ALKPHOS 49 12/28/2021   AST 17 12/28/2021   ALT 12 12/28/2021   PROT 6.9 12/28/2021   ALBUMIN 3.7 12/28/2021   CALCIUM 9.4 12/28/2021   GFRAA 61 11/14/2018    Speciality Comments: No specialty comments available.  Procedures:  No procedures performed Allergies: Diamox [acetazolamide], Epinephrine-lidocaine-na metabisulfite [lidocaine-epinephrine], Erythromycin, Penicillins, Sulfonamide derivatives, and Tape   Assessment / Plan:     Visit Diagnoses: No diagnosis found.  Orders: No orders of the defined types were placed in this encounter.  No orders of the defined types were placed in this encounter.   Face-to-face time spent with patient was *** minutes. Greater than 50% of time was spent in counseling and coordination of care.  Follow-Up Instructions: No follow-ups on file.   Earnestine Mealing, CMA  Note - This record has been created using Editor, commissioning.  Chart creation errors have been sought, but may not always  have been located. Such creation errors do not reflect on  the standard of medical care.

## 2022-07-16 ENCOUNTER — Other Ambulatory Visit: Payer: Self-pay | Admitting: Family Medicine

## 2022-07-21 ENCOUNTER — Other Ambulatory Visit: Payer: Self-pay | Admitting: Family Medicine

## 2022-07-21 ENCOUNTER — Ambulatory Visit: Payer: Medicare Other | Attending: Physician Assistant | Admitting: Physician Assistant

## 2022-07-21 DIAGNOSIS — Z8719 Personal history of other diseases of the digestive system: Secondary | ICD-10-CM | POA: Insufficient documentation

## 2022-07-21 DIAGNOSIS — M19072 Primary osteoarthritis, left ankle and foot: Secondary | ICD-10-CM | POA: Insufficient documentation

## 2022-07-21 DIAGNOSIS — M545 Low back pain, unspecified: Secondary | ICD-10-CM | POA: Insufficient documentation

## 2022-07-21 DIAGNOSIS — G8929 Other chronic pain: Secondary | ICD-10-CM | POA: Insufficient documentation

## 2022-07-21 DIAGNOSIS — Z8679 Personal history of other diseases of the circulatory system: Secondary | ICD-10-CM | POA: Insufficient documentation

## 2022-07-21 DIAGNOSIS — Z9181 History of falling: Secondary | ICD-10-CM | POA: Insufficient documentation

## 2022-07-21 DIAGNOSIS — M19071 Primary osteoarthritis, right ankle and foot: Secondary | ICD-10-CM | POA: Insufficient documentation

## 2022-07-21 DIAGNOSIS — M3500 Sicca syndrome, unspecified: Secondary | ICD-10-CM | POA: Insufficient documentation

## 2022-07-21 DIAGNOSIS — M8589 Other specified disorders of bone density and structure, multiple sites: Secondary | ICD-10-CM | POA: Insufficient documentation

## 2022-07-21 DIAGNOSIS — R5383 Other fatigue: Secondary | ICD-10-CM | POA: Insufficient documentation

## 2022-07-21 DIAGNOSIS — Z862 Personal history of diseases of the blood and blood-forming organs and certain disorders involving the immune mechanism: Secondary | ICD-10-CM | POA: Insufficient documentation

## 2022-07-21 DIAGNOSIS — M19041 Primary osteoarthritis, right hand: Secondary | ICD-10-CM | POA: Insufficient documentation

## 2022-07-21 DIAGNOSIS — I7300 Raynaud's syndrome without gangrene: Secondary | ICD-10-CM | POA: Insufficient documentation

## 2022-07-21 DIAGNOSIS — R2689 Other abnormalities of gait and mobility: Secondary | ICD-10-CM | POA: Insufficient documentation

## 2022-07-21 DIAGNOSIS — M19042 Primary osteoarthritis, left hand: Secondary | ICD-10-CM | POA: Insufficient documentation

## 2022-08-04 ENCOUNTER — Encounter: Payer: Self-pay | Admitting: Family Medicine

## 2022-08-04 ENCOUNTER — Ambulatory Visit (INDEPENDENT_AMBULATORY_CARE_PROVIDER_SITE_OTHER): Payer: Medicare Other | Admitting: Family Medicine

## 2022-08-04 VITALS — BP 122/64 | HR 74 | Temp 98.0°F | Ht 62.0 in | Wt 109.8 lb

## 2022-08-04 DIAGNOSIS — I480 Paroxysmal atrial fibrillation: Secondary | ICD-10-CM

## 2022-08-04 DIAGNOSIS — M3501 Sicca syndrome with keratoconjunctivitis: Secondary | ICD-10-CM | POA: Diagnosis not present

## 2022-08-04 DIAGNOSIS — N183 Chronic kidney disease, stage 3 unspecified: Secondary | ICD-10-CM

## 2022-08-04 DIAGNOSIS — I10 Essential (primary) hypertension: Secondary | ICD-10-CM | POA: Diagnosis not present

## 2022-08-04 NOTE — Patient Instructions (Addendum)
Flu shot (high dose) we should have these available within a month or two but please let us know if you get at outside pharmacy  No changes today- sorry our lab ended up closing mid visit BUT last labs looked so good can hold off until next visit  Recommended follow up: Return in about 6 months (around 02/02/2023) for followup or sooner if needed.Schedule b4 you leave.

## 2022-08-04 NOTE — Progress Notes (Signed)
Phone [phone] In person visit   Subjective:   Bailey [name] is a 88 y.o. year old very pleasant female patient who presents for/with See problem oriented charting Chief Complaint  Patient presents with   Follow-up   Hypertension    Past Medical History-  Patient Active Problem List   Diagnosis Date Noted   Sjogren's syndrome with keratoconjunctivitis sicca ([place]) 05/12/2017    Priority: High   Paroxysmal atrial fibrillation ([place]) 09/05/2008    Priority: High   CKD (chronic kidney disease), stage III ([place]) 12/08/2017    Priority: Medium    Moderate aortic regurgitation 12/08/2017    Priority: Medium    HTN (hypertension) 03/13/2015    Priority: Medium    Raynauds syndrome 10/21/2014    Priority: Medium    Primary osteoarthritis of both hands 08/21/2009    Priority: Medium    PVC (premature ventricular contraction) 06/04/2009    Priority: Medium    Mitral valve prolapse 12/25/2008    Priority: Medium    Irritable bowel syndrome 09/04/2008    Priority: Medium    Premature ventricular contractions     Priority: Low   Hepatitis     Priority: Low   History of skin cancer     Priority: Low   Primary osteoarthritis of both feet 10/26/2016    Priority: Low   Anemia 03/13/2015    Priority: Low   Recurrent cold sores 10/21/2014    Priority: Low   Edema 06/29/2011    Priority: Low   Osteopenia 09/05/2008    Priority: Low   GERD 09/04/2008    Priority: Low   ALLERGY 09/04/2008    Priority: Low   COLONIC POLYPS 09/04/2008    Priority: Low   Dry eyes, bilateral 05/15/2018   Lumbar radiculopathy 12/12/2017   Allergy    Balance disorder 10/26/2016    Medications- reviewed and updated Current Outpatient Medications  Medication Sig Dispense Refill   aspirin 81 MG tablet Take 81 mg by mouth daily.     carvedilol (COREG) 3.125 MG tablet TAKE 2 TABLETS BY MOUTH IN THE AM AND 1 TABLET BY MOUTH IN THE EVENING. 270 tablet 2   Cholecalciferol (VITAMIN D) 400 UNITS  capsule Take 2 capsules by mouth daily     cycloSPORINE (RESTASIS) 0.05 % ophthalmic emulsion Place 1 drop into both eyes 2 (two) times daily.     escitalopram (LEXAPRO) 10 MG tablet TAKE 1 TABLET BY MOUTH EVERY DAY 90 tablet 3   estrogen, conjugated,-medroxyprogesterone (PREMPRO) 0.45-1.5 MG tablet Take 1 tablet by mouth daily. 84 tablet 4   fish oil-omega-3 fatty acids 1000 MG capsule Take one capsule by mouth  daily     losartan (COZAAR) 100 MG tablet TAKE 1 TABLET BY MOUTH EVERY DAY 90 tablet 3   ondansetron (ZOFRAN ODT) 4 MG disintegrating tablet Take 1 tablet (4 mg total) by mouth every 8 (eight) hours as needed for nausea or vomiting. 20 tablet 0   No current facility-administered medications for this visit.     Objective:  BP 122/64   Pulse 74   Temp 98 F (36.7 C)   Ht '5\' 2"'$  (1.575 m)   Wt 109 lb 12.8 oz (49.8 kg)   LMP  (LMP Unknown)   SpO2 97%   BMI 20.08 kg/m  Gen: NAD, resting comfortably CV: RRR no murmurs rubs or gallops Lungs: CTAB no crackles, wheeze, rhonchi Ext: trace edema under compression stocking Skin: warm, dry    Assessment and Plan   #  Social update- Still in  [place] [facility]- independent living. Husband with [facility] and this had been really hard on her- they have more support which is helpful  #hypertension/CKD stage III S: compliant with losartan 50 mg, carvedilol 6.5 mg in the morning and 3.25 mg in the evening  Creatinine has been stable around 0.9-1 but she is rather thin. Avoid nsaids.  BP Readings from Last 3 Encounters:  08/04/22 122/64  01/20/22 (!) 132/55  01/11/22 136/70  A/P: HTN_ Controlled. Continue current medications.   CKD III stable and our lab is not available today- she prefers to wait unti next visit for CMP in 6 months -did have mild anemia but has been stbale- recheck next visit as well CBC   #Paroxysmal atrial fibrillation/palpitations-follows with Dr. Johnsie Cancel S: Patient previously with episodes of atrial  fibrillation related to epinephrine use during dental procedures.  Has not had a known episode since 2014.  She has been continued on aspirin alone since she has been in sinus rhythm for some time- compliant with this.  She is on carvedilol for blood pressure which would offer some rate control if needed but is primarily used for blood pressure A/P: a fib without recurrence- stay on coreg for palpitations control (not related to a fib). Stay on aspirin due to a fib history unless issues with falls in future  #Moderate aortic regurgitation-moderate 01/06/22 S: follows with echocardiograms with cardiology.  Wears compression hose for edema. Rare shortness of breath- deep breaths help. No chest pain. Using cane for balane to be on safe side- not dizzy A/P: overall stable- continue current meds   % Sjogren's and history of raynauds S: Follows closely with Dr. Estanislado Pandy- apparently diagnostic paperwork from Dr. French Ana was lost and has been told no regular follow up now at this point- hard to repeat since she is not in an active period.  On Restasis for dry eyes related to this A/P: has had close follow up but sounds like has been released due to issues finding original records. We will reach out to Dr. Estanislado Pandy directly and printed copy of records for patient back to 2010 including positive rheumatoid factor to 100  #Caregiver burden/stress/anxiety S: Medication: Lexapro 10 mg.  Buspirone 5 mg prescribed in last visit recommended she move forward with trying this in addition to the Lexapro- she declined and has not taken- will remove from med list -They were strongly considering moving to assisted living and was told not a candidate. Care for 14 hours a day - has noted inability to cry on medication -occasional anxiety/irritability but better overall A/P: reasonably stable- considered reducing dose due to inability to cry but after further discussion- opted to hold off -she may try half tablet at home  and let me know how she does at later date  Recommended follow up: Return in about 6 months (around 02/02/2023) for followup or sooner if needed.Schedule b4 you leave. Future Appointments  Date Time Provider [place]  09/22/2022  9:00 AM Josue Hector, MD [facility]  10/27/2022 11:30 AM Princess Bruins, MD [facility] None  11/19/2022  1:45 PM [facility]  01/12/2023 10:35 AM [facility] E[facility] 5 [id number]    Lab/Order associations:   ICD-10-CM   1. Stage 3 chronic kidney disease, unspecified whether stage 3a or 3b CKD ([place])  N18.30     2. Essential hypertension  I10     3. Hypertension, unspecified type  I10  4. Paroxysmal atrial fibrillation ([facility])  I48.0       No orders of the defined types were placed in this encounter.   Return precautions advised.  Garret Reddish, MD

## 2022-08-23 DIAGNOSIS — B351 Tinea unguium: Secondary | ICD-10-CM | POA: Diagnosis not present

## 2022-08-23 DIAGNOSIS — M79675 Pain in left toe(s): Secondary | ICD-10-CM | POA: Diagnosis not present

## 2022-08-23 DIAGNOSIS — M2011 Hallux valgus (acquired), right foot: Secondary | ICD-10-CM | POA: Diagnosis not present

## 2022-08-23 DIAGNOSIS — N189 Chronic kidney disease, unspecified: Secondary | ICD-10-CM | POA: Diagnosis not present

## 2022-08-26 DIAGNOSIS — Z23 Encounter for immunization: Secondary | ICD-10-CM | POA: Diagnosis not present

## 2022-09-01 ENCOUNTER — Telehealth: Payer: Self-pay

## 2022-09-01 NOTE — Telephone Encounter (Signed)
88 yo questioning if yearly mammo still recommended at her age?  She had her last mammo 09/03/21 that was negative.  She is scheduled at [facility] for 09/08/[facility].  I explained Dr. Marguerita Merles out this week and I will call her next week.

## 2022-09-06 NOTE — Progress Notes (Unsigned)
Office Visit Note  Patient: Bailey [name]             Date of Birth: December 08, 1932           MRN: [id number]             PCP: Marin Olp, MD Referring: Marin Olp, MD Visit Date: 09/15/2022 Occupation: '@GUAROCC'$ @  Subjective:  Sicca symptoms   History of Present Illness: Bailey [name] is a 88 y.o. female with history of sicca symptoms, Raynaud's phenomenon, and osteoarthritis.  She was last seen in the office in 07/29/21.   She reports she has had ongoing sicca symptoms and has been using restasis and biotene products for symptomatic relief.  She denies any swollen lymph nodes or parotid swelling or tenderness.  She has intermittent symptoms of raynaud's phenomenon especially with weather changes.  She denies any digital ulcers.  She denies any new pulmonary symptoms.  She continues to experience intermittent pain and stiffness in both elbows but denies any joint swelling. She has occasional pain in the left knee but denies any joint swelling. She uses a cane to assist with ambulation.    Activities of Daily Living:  Patient reports morning stiffness for 20 minutes.   Patient Denies nocturnal pain.  Difficulty dressing/grooming: Denies Difficulty climbing stairs: Denies Difficulty getting out of chair: Denies Difficulty using hands for taps, buttons, cutlery, and/or writing: Denies  Review of Systems  Constitutional:  Positive for fatigue.  HENT:  Positive for mouth sores and mouth dryness.   Eyes:  Positive for dryness.  Respiratory:  Negative for shortness of breath.   Cardiovascular:  Positive for swelling in legs/feet. Negative for chest pain and palpitations.  Gastrointestinal:  Negative for blood in stool, constipation and diarrhea.  Endocrine: Positive for increased urination.  Genitourinary:  Negative for involuntary urination.  Musculoskeletal:  Positive for myalgias, morning stiffness, muscle tenderness and myalgias. Negative for joint pain, gait problem, joint pain,  joint swelling and muscle weakness.  Skin:  Positive for color change and rash. Negative for hair loss and sensitivity to sunlight.  Allergic/Immunologic: Negative for susceptible to infections.  Neurological:  Positive for headaches. Negative for dizziness.  Hematological:  Positive for bruising/bleeding tendency. Negative for swollen glands.  Psychiatric/Behavioral:  Negative for depressed mood and sleep disturbance. The patient is nervous/anxious.     PMFS History:  Patient Active Problem List   Diagnosis Date Noted   Dry eyes, bilateral 05/15/2018   Lumbar radiculopathy 12/12/2017   Allergy    CKD (chronic kidney disease), stage III ([place]) 12/08/2017   Moderate aortic regurgitation 12/08/2017   Premature ventricular contractions    Hepatitis    History of skin cancer    Sjogren's syndrome with keratoconjunctivitis sicca ([place]) 05/12/2017   Balance disorder 10/26/2016   Primary osteoarthritis of both feet 10/26/2016   HTN (hypertension) 03/13/2015   Anemia 03/13/2015   Raynauds syndrome 10/21/2014   Recurrent cold sores 10/21/2014   Edema 06/29/2011   Primary osteoarthritis of both hands 08/21/2009   PVC (premature ventricular contraction) 06/04/2009   Mitral valve prolapse 12/25/2008   Paroxysmal atrial fibrillation ([place]) 09/05/2008   Osteopenia 09/05/2008   GERD 09/04/2008   Irritable bowel syndrome 09/04/2008   ALLERGY 09/04/2008   COLONIC POLYPS 09/04/2008    Past Medical History:  Diagnosis Date   Allergy, unspecified not elsewhere classified    Anemia, unspecified    Benign neoplasm of colon    Cancer ([place])    basal/sqamous/transitional melanoma  CKD (chronic kidney disease), stage III ([place]) 12/08/2017   COLONIC POLYPS 09/04/2008   Was told by Gi does not need anymore.     Diverticulosis of colon (without mention of hemorrhage)    Dry eye syndrome    Dysrhythmia    PVCs   Esophageal reflux    Hepatitis 1950s   infectious from food   History of atrial  fibrillation    Hypertension    Irritable bowel syndrome    Mitral valve disorders(424.0)    Moderate aortic regurgitation 12/08/2017   Osteopenia 06/2018   T score -1.8 FRAX 10% / 2.5%   Palpitations    Premature ventricular contractions    Rheumatoid arthritis(714.0)    Sicca syndrome ([place])    Ulcer 1992   Vaginal delivery 1959, 1960, 1962, 1964    Family History  Problem Relation Age of Onset   Stroke Mother    Hypertension Mother    Ulcerative colitis Father    Heart attack Father    Breast cancer Maternal Grandmother        Age 36's   Uterine cancer Maternal Grandmother    Hypertension Maternal Grandfather    Colon cancer Paternal Grandmother    Past Surgical History:  Procedure Laterality Date   APPENDECTOMY     CATARACT EXTRACTION     CHOLECYSTECTOMY     colon tumor  1969   Benign rectal tumor excised   DILATATION & CURETTAGE/HYSTEROSCOPY WITH MYOSURE N/A 01/21/2015   Procedure: DILATATION & CURETTAGE/HYSTEROSCOPY WITH MYOSURE;  Surgeon: [name], MD;  Location: [facility];  Service: Gynecology;  Laterality: N/A;   HIATAL HERNIA REPAIR     REFRACTIVE SURGERY     ROTATOR CUFF REPAIR  2009   right by Dr. French Ana   SKIN BIOPSY  11/2020   squamous cell removed  2013   Dr. Sherrye Payor --removed from her right cheek   TUBAL LIGATION     Social History   Social History Narrative   Married 1958. 4 children. 10 grandkids (1 trying to go to medical school). No greatgrandkids.       Retired from nursing (medical and surgical, most recently in cardiology)   Degree in history and english after finished nursing- 2 mo before 72th birthday      Hobbies: education, travel, reading, piano    Immunization History  Administered Date(s) Administered   Fluad Quad(high Dose 65+) 08/21/2019   H1N1 11/26/2008   Influenza Split 08/30/2011, 10/31/2012   Influenza Whole 09/05/2008, 08/27/2009, 08/20/2010   Influenza, High Dose Seasonal PF 08/31/2016, 09/05/2017, 08/30/2018,  08/21/2022   Influenza,inj,Quad PF,6+ Mos 09/13/2013, 10/04/2014, 10/01/2015   Influenza-Unspecified 08/26/2020, 09/16/2021   Moderna Covid-19 Vaccine Bivalent Booster 19yr & up 12/03/2021   Moderna SARS-COV2 Booster Vaccination 03/30/2021   Moderna Sars-Covid-2 Vaccination 12/20/2019, 01/17/2020, 10/02/2020   Pneumococcal Conjugate-13 10/21/2014   Pneumococcal Polysaccharide-23 10/11/2015   Td 02/17/2010   Tdap 03/02/2021   Zoster, Live 11/29/2008     Objective: Vital Signs: BP (!) 161/76 (BP Location: Left Arm, Patient Position: Sitting, Cuff Size: Normal)   Pulse 65   Resp 14   Ht '5\' 2"'$  (1.575 m)   Wt 110 lb 9.6 oz (50.2 kg)   LMP  (LMP Unknown)   BMI 20.23 kg/m    Physical Exam Vitals and nursing note reviewed.  Constitutional:      Appearance: She is well-developed.  HENT:     Head: Normocephalic and atraumatic.  Eyes:     Conjunctiva/sclera: Conjunctivae normal.  Cardiovascular:     Rate and Rhythm: Normal rate and regular rhythm.     Heart sounds: Normal heart sounds.  Pulmonary:     Effort: Pulmonary effort is normal.     Breath sounds: Normal breath sounds.  Abdominal:     General: Bowel sounds are normal.     Palpations: Abdomen is soft.  Musculoskeletal:     Cervical back: Normal range of motion.  Skin:    General: Skin is warm and dry.     Capillary Refill: Capillary refill takes less than 2 seconds.  Neurological:     Mental Status: She is alert and oriented to person, place, and time.  Psychiatric:        Behavior: Behavior normal.      Musculoskeletal Exam: C-spine, thoracic spine, and lumbar spine good ROM.  No midline spinal tenderness or SI joint tenderness.  Shoulder joints, elbow joints, wrist joints, MCPs, PIPs, and DIPs good ROM with no synovitis.   PIP and DIP thickening consistent with osteoarthritis of both hands.   Hip joints have good range of motion with no groin pain.  No tenderness over trochanteric bursa bilaterally.  Knee joints  have good range of motion with no warmth or effusion.  Ankle joints have good range of motion with no tenderness or joint swelling.  CDAI Exam: CDAI Score: -- Patient Global: --; Provider Global: -- Swollen: --; Tender: -- Joint Exam 09/15/2022   No joint exam has been documented for this visit   There is currently no information documented on the homunculus. Go to the Rheumatology activity and complete the homunculus joint exam.  Investigation: No additional findings.  Imaging: No results found.  Recent Labs: Lab Results  Component Value Date   WBC 10.4 12/28/2021   HGB 11.3 (L) 12/28/2021   PLT 321.0 12/28/2021   NA 137 12/28/2021   K 4.4 12/28/2021   CL 102 12/28/2021   CO2 28 12/28/2021   GLUCOSE 125 (H) 12/28/2021   BUN 21 12/28/2021   CREATININE 1.05 12/28/2021   BILITOT 0.5 12/28/2021   ALKPHOS 49 12/28/2021   AST 17 12/28/2021   ALT 12 12/28/2021   PROT 6.9 12/28/2021   ALBUMIN 3.7 12/28/2021   CALCIUM 9.4 12/28/2021   GFRAA 61 11/14/2018    Speciality Comments: No specialty comments available.  Procedures:  No procedures performed Allergies: Diamox [acetazolamide], Epinephrine-lidocaine-na metabisulfite [lidocaine-epinephrine], Erythromycin, Penicillins, Sulfonamide derivatives, and Tape   Assessment / Plan:     Visit Diagnoses: Sicca syndrome ([place]) - Negative ANA in 2010 and positive rheumatoid factor.  -RF,-SPEP 11/17: She continues to have chronic sicca symptoms.  She has been using Restasis eyedrops as well as Biotene products for symptomatic relief.  She has no parotid swelling or tenderness.  No cervical lymphadenopathy.  She has not had any oral or nasal ulcerations.  She is not currently taking any immunosuppressive agents.  She has no signs of inflammatory arthritis at this time.  No synovitis was noted on examination today.  Patient was advised to notify us if she develops any new or worsening symptoms.  The following lab work will be obtained today  for further evaluation.  - Plan: CBC with Differential/Platelet, COMPLETE METABOLIC PANEL WITH GFR, Urinalysis, Routine w reflex microscopic, ANA, Sedimentation rate, C3 and C4, Rheumatoid factor, Sjogrens syndrome-A extractable nuclear antibody, Sjogrens syndrome-B extractable nuclear antibody, Serum protein electrophoresis with reflex  Raynaud's disease without gangrene: No digital ulcerations or signs of gangrene were noted on examination.  She  experiences intermittent symptoms of Raynaud's phenomenon especially with colder weather temperatures.  Discussed the importance of wearing gloves and keeping her core body temperature warm.  No signs of sclerodactyly were noted on examination today.  Chronic midline low back pain without sciatica: No midline spinal tenderness or symptoms of sciatica at this time.  Primary osteoarthritis of both hands: She has PIP and DIP thickening consistent with osteoarthritis of both hands.  No tenderness or inflammation noted on exam.  Complete fist formation bilaterally.  Primary osteoarthritis of both feet: She is not experiencing any increased discomfort in her feet at this time.  Osteopenia of multiple sites - DEXA 08/05/2020 T-score: -1.6, BMD: 0.668 left femoral neck. DEXA 07/26/18 L femoral neck T-score -1.8.  Ordered by Dr. Yong Channel.  Due to update DEXA. She continues to take vitamin D on a daily basis.  Other fatigue:Chronic, stable. She has been going to exercise classes 3 days weekly.  She has interrupted sleep at night which contributes to her level of fatigue.  Other medical conditions are listed as follows:   History of hypertension: BP was elevated today in the office and was rechecked. Advised patient to monitor blood pressure closely and continue her PCP if it remains elevated.   History of anemia - Monitored by PCP.   History of atrial fibrillation  History of recent fall  History of gastroesophageal reflux (GERD)  History of  IBS  Orders: Orders Placed This Encounter  Procedures   CBC with Differential/Platelet   COMPLETE METABOLIC PANEL WITH GFR   Urinalysis, Routine w reflex microscopic   ANA   Sedimentation rate   C3 and C4   Rheumatoid factor   Sjogrens syndrome-A extractable nuclear antibody   Sjogrens syndrome-B extractable nuclear antibody   Serum protein electrophoresis with reflex   No orders of the defined types were placed in this encounter.     Follow-Up Instructions: Return in about 6 months (around 03/17/2023) for Raynaud's syndrome, Osteoarthritis.   Ofilia Neas, PA-C  Note - This record has been created using Dragon software.  Chart creation errors have been sought, but may not always  have been located. Such creation errors do not reflect on  the standard of medical care.

## 2022-09-08 DIAGNOSIS — Z1231 Encounter for screening mammogram for malignant neoplasm of breast: Secondary | ICD-10-CM | POA: Diagnosis not present

## 2022-09-08 LAB — HM MAMMOGRAPHY

## 2022-09-09 NOTE — Telephone Encounter (Signed)
Bailey Bruins, MD  You 11 hours ago (9:56 PM)   She can stop doing screening mammograms if she is comfortable with that.      I called patient and per DPR access note on file I left detailed message in voice mail with Dr. Assunta Curtis reply.

## 2022-09-15 ENCOUNTER — Ambulatory Visit: Payer: Medicare Other | Attending: Physician Assistant | Admitting: Physician Assistant

## 2022-09-15 ENCOUNTER — Encounter: Payer: Self-pay | Admitting: Physician Assistant

## 2022-09-15 VITALS — BP 161/76 | HR 65 | Resp 14 | Ht 62.0 in | Wt 110.6 lb

## 2022-09-15 DIAGNOSIS — Z862 Personal history of diseases of the blood and blood-forming organs and certain disorders involving the immune mechanism: Secondary | ICD-10-CM | POA: Insufficient documentation

## 2022-09-15 DIAGNOSIS — M19071 Primary osteoarthritis, right ankle and foot: Secondary | ICD-10-CM | POA: Diagnosis not present

## 2022-09-15 DIAGNOSIS — R5383 Other fatigue: Secondary | ICD-10-CM | POA: Insufficient documentation

## 2022-09-15 DIAGNOSIS — Z9181 History of falling: Secondary | ICD-10-CM | POA: Diagnosis not present

## 2022-09-15 DIAGNOSIS — G8929 Other chronic pain: Secondary | ICD-10-CM | POA: Diagnosis not present

## 2022-09-15 DIAGNOSIS — R2689 Other abnormalities of gait and mobility: Secondary | ICD-10-CM | POA: Diagnosis not present

## 2022-09-15 DIAGNOSIS — M8589 Other specified disorders of bone density and structure, multiple sites: Secondary | ICD-10-CM | POA: Insufficient documentation

## 2022-09-15 DIAGNOSIS — M19072 Primary osteoarthritis, left ankle and foot: Secondary | ICD-10-CM | POA: Insufficient documentation

## 2022-09-15 DIAGNOSIS — M3500 Sicca syndrome, unspecified: Secondary | ICD-10-CM | POA: Insufficient documentation

## 2022-09-15 DIAGNOSIS — Z8719 Personal history of other diseases of the digestive system: Secondary | ICD-10-CM | POA: Insufficient documentation

## 2022-09-15 DIAGNOSIS — Z8679 Personal history of other diseases of the circulatory system: Secondary | ICD-10-CM | POA: Diagnosis not present

## 2022-09-15 DIAGNOSIS — M19041 Primary osteoarthritis, right hand: Secondary | ICD-10-CM | POA: Insufficient documentation

## 2022-09-15 DIAGNOSIS — M545 Low back pain, unspecified: Secondary | ICD-10-CM | POA: Insufficient documentation

## 2022-09-15 DIAGNOSIS — M19042 Primary osteoarthritis, left hand: Secondary | ICD-10-CM | POA: Insufficient documentation

## 2022-09-15 DIAGNOSIS — I7300 Raynaud's syndrome without gangrene: Secondary | ICD-10-CM | POA: Insufficient documentation

## 2022-09-16 NOTE — Progress Notes (Signed)
Anemia stable.  Glucose is 114. Creatinine is elevated 1.06 and GFR is low-51. Please advise the patient to avoid the use of NSAIDs.  RF borderline positive.  Complements WNL.  ESR WNL.  UA normal.

## 2022-09-17 LAB — COMPLETE METABOLIC PANEL WITH GFR
AG Ratio: 1.3 (calc) (ref 1.0–2.5)
ALT: 9 U/L (ref 6–29)
AST: 15 U/L (ref 10–35)
Albumin: 3.9 g/dL (ref 3.6–5.1)
Alkaline phosphatase (APISO): 54 U/L (ref 37–153)
BUN/Creatinine Ratio: 23 (calc) — ABNORMAL HIGH (ref 6–22)
BUN: 24 mg/dL (ref 7–25)
CO2: 30 mmol/L (ref 20–32)
Calcium: 9.4 mg/dL (ref 8.6–10.4)
Chloride: 102 mmol/L (ref 98–110)
Creat: 1.06 mg/dL — ABNORMAL HIGH (ref 0.60–0.95)
Globulin: 3 g/dL (calc) (ref 1.9–3.7)
Glucose, Bld: 114 mg/dL — ABNORMAL HIGH (ref 65–99)
Potassium: 4.1 mmol/L (ref 3.5–5.3)
Sodium: 139 mmol/L (ref 135–146)
Total Bilirubin: 0.4 mg/dL (ref 0.2–1.2)
Total Protein: 6.9 g/dL (ref 6.1–8.1)
eGFR: 51 mL/min/{1.73_m2} — ABNORMAL LOW (ref >=60)

## 2022-09-17 LAB — ANA: Anti Nuclear Antibody (ANA): NEGATIVE

## 2022-09-17 LAB — CBC WITH DIFFERENTIAL/PLATELET
Absolute Monocytes: 987 cells/uL — ABNORMAL HIGH (ref 200–950)
Basophils Absolute: 53 cells/uL (ref 0–200)
Basophils Relative: 0.5 %
Eosinophils Absolute: 105 cells/uL (ref 15–500)
Eosinophils Relative: 1 %
HCT: 35.3 % (ref 35.0–45.0)
Hemoglobin: 11.6 g/dL — ABNORMAL LOW (ref 11.7–15.5)
Lymphs Abs: 1901 cells/uL (ref 850–3900)
MCH: 31.2 pg (ref 27.0–33.0)
MCHC: 32.9 g/dL (ref 32.0–36.0)
MCV: 94.9 fL (ref 80.0–100.0)
MPV: 11.2 fL (ref 7.5–12.5)
Monocytes Relative: 9.4 %
Neutro Abs: 7455 cells/uL (ref 1500–7800)
Neutrophils Relative %: 71 %
Platelets: 285 10*3/uL (ref 140–400)
RBC: 3.72 10*6/uL — ABNORMAL LOW (ref 3.80–5.10)
RDW: 11.6 % (ref 11.0–15.0)
Total Lymphocyte: 18.1 %
WBC: 10.5 10*3/uL (ref 3.8–10.8)

## 2022-09-17 LAB — URINALYSIS, ROUTINE W REFLEX MICROSCOPIC
Bilirubin Urine: NEGATIVE
Glucose, UA: NEGATIVE
Hgb urine dipstick: NEGATIVE
Ketones, ur: NEGATIVE
Leukocytes,Ua: NEGATIVE
Nitrite: NEGATIVE
Protein, ur: NEGATIVE
Specific Gravity, Urine: 1.024 (ref 1.001–1.035)
pH: <=5 (ref 5.0–8.0)

## 2022-09-17 LAB — PROTEIN ELECTROPHORESIS, SERUM, WITH REFLEX
Albumin ELP: 3.7 g/dL — ABNORMAL LOW (ref 3.8–4.8)
Alpha 1: 0.3 g/dL (ref 0.2–0.3)
Alpha 2: 0.6 g/dL (ref 0.5–0.9)
Beta 2: 0.3 g/dL (ref 0.2–0.5)
Beta Globulin: 0.4 g/dL (ref 0.4–0.6)
Gamma Globulin: 1.2 g/dL (ref 0.8–1.7)
Total Protein: 6.6 g/dL (ref 6.1–8.1)

## 2022-09-17 LAB — SEDIMENTATION RATE: Sed Rate: 9 mm/h (ref 0–30)

## 2022-09-17 LAB — SJOGRENS SYNDROME-B EXTRACTABLE NUCLEAR ANTIBODY: SSB (La) (ENA) Antibody, IgG: <1 AI

## 2022-09-17 LAB — C3 AND C4
C3 Complement: 93 mg/dL
C4 Complement: 16 mg/dL

## 2022-09-17 LAB — SJOGRENS SYNDROME-A EXTRACTABLE NUCLEAR ANTIBODY: SSA (Ro) (ENA) Antibody, IgG: <1 AI

## 2022-09-17 LAB — RHEUMATOID FACTOR: Rheumatoid fact SerPl-aCnc: 16 IU/mL — ABNORMAL HIGH (ref <14)

## 2022-09-17 NOTE — Progress Notes (Unsigned)
Patient ID: Bailey [name], female   DOB: Sep 25, 1933, 88 y.o.   MRN: [id number]     89 y.o. history of PVC;s, HTN and moderate AR, Raynaud's and Sjorgrens Sees dermatology for chronic lichen sclerosis    Home BP readings good and cuff accurate  Compliant with meds Some fatigue    Now living at [facility] as she had been in home for 46 years Husbands Parkinsons confines her quite a bit   TTE done 01/06/22  showed moderate AR / mild MR with normal LV size and EF 60-65%   Caring for husband with bulbar palsy very difficult She does have some help now   She has RA and recent labs negative for Sjogrens serology   ROS: Denies fever, malais, weight loss, blurry vision, decreased visual acuity, cough, sputum, SOB, hemoptysis, pleuritic pain, palpitaitons, heartburn, abdominal pain, melena, lower extremity edema, claudication, or rash.  All other systems reviewed and negative  General: Vitals:   09/22/22 0858  BP: (!) 152/60  Pulse: 65  SpO2: 98%      Affect appropriate Elderly female  HEENT: normal Neck supple with no adenopathy JVP normal no bruits no thyromegaly Lungs clear with no wheezing and good diaphragmatic motion Heart:  S1/S2 SEM and AR murmur, no rub, gallop or click PMI normal Abdomen: benighn, BS positve, no tenderness, no AAA no bruit.  No HSM or HJR Distal pulses intact with no bruits No edema Neuro non-focal No muscular weakness Healing ulcer right ankle    Current Outpatient Medications  Medication Sig Dispense Refill   aspirin 81 MG tablet Take 81 mg by mouth daily.     carvedilol (COREG) 3.125 MG tablet TAKE 2 TABLETS BY MOUTH IN THE AM AND 1 TABLET BY MOUTH IN THE EVENING. 270 tablet 2   Cholecalciferol (VITAMIN D) 400 UNITS capsule Take 2 capsules by mouth daily     cycloSPORINE (RESTASIS) 0.05 % ophthalmic emulsion Place 1 drop into both eyes 2 (two) times daily.     escitalopram (LEXAPRO) 10 MG tablet TAKE 1 TABLET BY MOUTH EVERY DAY 90 tablet 3    estrogen, conjugated,-medroxyprogesterone (PREMPRO) 0.45-1.5 MG tablet Take 1 tablet by mouth daily. 84 tablet 4   fish oil-omega-3 fatty acids 1000 MG capsule Take one capsule by mouth  daily     losartan (COZAAR) 100 MG tablet TAKE 1 TABLET BY MOUTH EVERY DAY 90 tablet 3   No current facility-administered medications for this visit.    Allergies  Diamox [acetazolamide], Epinephrine-lidocaine-na metabisulfite [lidocaine-epinephrine], Erythromycin, Penicillins, Sulfonamide derivatives, and Tape  Electrocardiogram:  09/22/2022 nonspecific ST changes 09/22/2022  SR rate 63 PaC low atrial foci LAD   Assessment and Plan Palpitations:  PAC;s /PVC;s  Stable on beta blocker   AR:   Moderate compensated LV by TTE 01/06/22 on ARB  HTN:  ARB and beta blocker increased March 2019   Edema:  Venous wearing compression stockings improved f/u dermatology   Chest pain:  Improved normal myovue 04/05/16  observe   Anxiety:  Husbands health and recent move to [place] f/u primary Lexapro and Buspar   Derm/Sjogrens:  Consider 2nd opinion regarding non healing lichen lesion on right ankle Restasis eye drops   F/U  6 months   Time spent on reviewing chart, writing note and direct patient interview via video 20 minutes   Jenkins Rouge

## 2022-09-17 NOTE — Progress Notes (Signed)
ANA, Ro, and La antibodies are negative.  SPEP pending.

## 2022-09-17 NOTE — Progress Notes (Signed)
SPEP did not reveal any abnormal protein bands.

## 2022-09-22 ENCOUNTER — Ambulatory Visit: Payer: Medicare Other | Admitting: Cardiovascular Disease

## 2022-09-22 ENCOUNTER — Ambulatory Visit: Payer: Medicare Other | Attending: Cardiovascular Disease | Admitting: Cardiovascular Disease

## 2022-09-22 ENCOUNTER — Encounter: Payer: Self-pay | Admitting: Cardiovascular Disease

## 2022-09-22 VITALS — BP 152/60 | HR 65 | Ht 62.0 in | Wt 111.0 lb

## 2022-09-22 DIAGNOSIS — I10 Essential (primary) hypertension: Secondary | ICD-10-CM | POA: Diagnosis not present

## 2022-09-22 DIAGNOSIS — I351 Nonrheumatic aortic (valve) insufficiency: Secondary | ICD-10-CM | POA: Diagnosis not present

## 2022-09-22 NOTE — Patient Instructions (Addendum)
Medication Instructions:  Your physician recommends that you continue on your current medications as directed. Please refer to the Current Medication list given to you today.  *If you need a refill on your cardiac medications before your next appointment, please call your pharmacy*  Lab Work: If you have labs (blood work) drawn today and your tests are completely normal, you will receive your results only by: MyChart Message (if you have MyChart) OR A paper copy in the mail If you have any lab test that is abnormal or we need to change your treatment, we will call you to review the results.  Testing/Procedures: None ordered today.  Follow-Up: At [facility], you and your health needs are our priority.  As part of our continuing mission to provide you with exceptional heart care, we have created designated Provider Care Teams.  These Care Teams include your primary Cardiologist (physician) and Advanced Practice Providers (APPs -  Physician Assistants and Nurse Practitioners) who all work together to provide you with the care you need, when you need it.  We recommend signing up for the patient portal called "MyChart".  Sign up information is provided on this After Visit Summary.  MyChart is used to connect with patients for Virtual Visits (Telemedicine).  Patients are able to view lab/test results, encounter notes, upcoming appointments, etc.  Non-urgent messages can be sent to your provider as well.   To learn more about what you can do with MyChart, go to [email or web address].    Your next appointment:    6 month(s)  The format for your next appointment:   In Person  Provider:   Peter Nishan, MD     Important Information About Sugar       

## 2022-09-27 DIAGNOSIS — Z23 Encounter for immunization: Secondary | ICD-10-CM | POA: Diagnosis not present

## 2022-10-27 ENCOUNTER — Ambulatory Visit: Payer: Medicare Other | Admitting: Obstetrics & Gynecology

## 2022-11-15 ENCOUNTER — Ambulatory Visit (INDEPENDENT_AMBULATORY_CARE_PROVIDER_SITE_OTHER): Payer: Medicare Other

## 2022-11-15 VITALS — Wt 111.0 lb

## 2022-11-15 DIAGNOSIS — Z0000 Encounter for general adult medical examination without abnormal findings: Secondary | ICD-10-CM

## 2022-11-15 NOTE — Patient Instructions (Signed)
Bailey [name] , Thank you for taking time to come for your Medicare Wellness Visit. I appreciate your ongoing commitment to your health goals. Please review the following plan we discussed and let me know if I can assist you in the future.   These are the goals we discussed:  Goals      Exercise 150 minutes per week (moderate activity)     Will get back to exercise !  Helps with balance  Will go back to PT     Patient Stated     Monitor caregiver fatigue Caregiver support groups may be helpful  [employer]  [employer]; 8063336377 Senior Directory; Information regarding Long Term Care List of caregiver groups as well as senior options in the community   Explore caregiver fatigue https://caregiver.com/articles/fighting_caregiver_fatigue/     Patient Stated     Working on increasing appetite     Patient Stated     Work on stamina      Patient Stated     Get rid of cane         This is a list of the screening recommended for you and due dates:  Health Maintenance  Topic Date Due   Zoster (Shingles) Vaccine (1 of 2) Never done   COVID-19 Vaccine (5 - 2023-24 season) 11/15/2022   Medicare Annual Wellness Visit  11/16/2023   DTaP/Tdap/Td vaccine (3 - Td or Tdap) 03/03/2031   Pneumonia Vaccine  Completed   Flu Shot  Completed   DEXA scan (bone density measurement)  Completed   HPV Vaccine  Aged Out    Advanced directives: Please bring a copy of your health care power of attorney and living will to the office at your convenience.  Conditions/risks identified: get rid of this cane   Next appointment: Follow up in one year for your annual wellness visit    Preventive Care 65 Years and Older, Female Preventive care refers to lifestyle choices and visits with your health care provider that can promote health and wellness. What does preventive care include? A yearly physical exam. This is also called an annual well check. Dental exams once or  twice a year. Routine eye exams. Ask your health care provider how often you should have your eyes checked. Personal lifestyle choices, including: Daily care of your teeth and gums. Regular physical activity. Eating a healthy diet. Avoiding tobacco and drug use. Limiting alcohol use. Practicing safe sex. Taking low-dose aspirin every day. Taking vitamin and mineral supplements as recommended by your health care provider. What happens during an annual well check? The services and screenings done by your health care provider during your annual well check will depend on your age, overall health, lifestyle risk factors, and family history of disease. Counseling  Your health care provider may ask you questions about your: Alcohol use. Tobacco use. Drug use. Emotional well-being. Home and relationship well-being. Sexual activity. Eating habits. History of falls. Memory and ability to understand (cognition). Work and work Statistician. Reproductive health. Screening  You may have the following tests or measurements: Height, weight, and BMI. Blood pressure. Lipid and cholesterol levels. These may be checked every 5 years, or more frequently if you are over 81 years old. Skin check. Lung cancer screening. You may have this screening every year starting at age 57 if you have a 30-pack-year history of smoking and currently smoke or have quit within the past 15 years. Fecal occult blood test (FOBT) of the stool. You may have this test  every year starting at age 89. Flexible sigmoidoscopy or colonoscopy. You may have a sigmoidoscopy every 5 years or a colonoscopy every 10 years starting at age 80. Hepatitis C blood test. Hepatitis B blood test. Sexually transmitted disease (STD) testing. Diabetes screening. This is done by checking your blood sugar (glucose) after you have not eaten for a while (fasting). You may have this done every 1-3 years. Bone density scan. This is done to screen for  osteoporosis. You may have this done starting at age 68. Mammogram. This may be done every 1-2 years. Talk to your health care provider about how often you should have regular mammograms. Talk with your health care provider about your test results, treatment options, and if necessary, the need for more tests. Vaccines  Your health care provider may recommend certain vaccines, such as: Influenza vaccine. This is recommended every year. Tetanus, diphtheria, and acellular pertussis (Tdap, Td) vaccine. You may need a Td booster every 10 years. Zoster vaccine. You may need this after age 10. Pneumococcal 13-valent conjugate (PCV13) vaccine. One dose is recommended after age 33. Pneumococcal polysaccharide (PPSV23) vaccine. One dose is recommended after age 3. Talk to your health care provider about which screenings and vaccines you need and how often you need them. This information is not intended to replace advice given to you by your health care provider. Make sure you discuss any questions you have with your health care provider. Document Released: 12/12/2015 Document Revised: 08/04/2016 Document Reviewed: 09/16/2015 Elsevier Interactive Patient Education  2017 [place] Prevention in the Home Falls can cause injuries. They can happen to people of all ages. There are many things you can do to make your home safe and to help prevent falls. What can I do on the outside of my home? Regularly fix the edges of walkways and driveways and fix any cracks. Remove anything that might make you trip as you walk through a door, such as a raised step or threshold. Trim any bushes or trees on the path to your home. Use bright outdoor lighting. Clear any walking paths of anything that might make someone trip, such as rocks or tools. Regularly check to see if handrails are loose or broken. Make sure that both sides of any steps have handrails. Any raised decks and porches should have guardrails on  the edges. Have any leaves, snow, or ice cleared regularly. Use sand or salt on walking paths during winter. Clean up any spills in your garage right away. This includes oil or grease spills. What can I do in the bathroom? Use night lights. Install grab bars by the toilet and in the tub and shower. Do not use towel bars as grab bars. Use non-skid mats or decals in the tub or shower. If you need to sit down in the shower, use a plastic, non-slip stool. Keep the floor dry. Clean up any water that spills on the floor as soon as it happens. Remove soap buildup in the tub or shower regularly. Attach bath mats securely with double-sided non-slip rug tape. Do not have throw rugs and other things on the floor that can make you trip. What can I do in the bedroom? Use night lights. Make sure that you have a light by your bed that is easy to reach. Do not use any sheets or blankets that are too big for your bed. They should not hang down onto the floor. Have a firm chair that has side arms. You can use  this for support while you get dressed. Do not have throw rugs and other things on the floor that can make you trip. What can I do in the kitchen? Clean up any spills right away. Avoid walking on wet floors. Keep items that you use a lot in easy-to-reach places. If you need to reach something above you, use a strong step stool that has a grab bar. Keep electrical cords out of the way. Do not use floor polish or wax that makes floors slippery. If you must use wax, use non-skid floor wax. Do not have throw rugs and other things on the floor that can make you trip. What can I do with my stairs? Do not leave any items on the stairs. Make sure that there are handrails on both sides of the stairs and use them. Fix handrails that are broken or loose. Make sure that handrails are as long as the stairways. Check any carpeting to make sure that it is firmly attached to the stairs. Fix any carpet that is loose  or worn. Avoid having throw rugs at the top or bottom of the stairs. If you do have throw rugs, attach them to the floor with carpet tape. Make sure that you have a light switch at the top of the stairs and the bottom of the stairs. If you do not have them, ask someone to add them for you. What else can I do to help prevent falls? Wear shoes that: Do not have high heels. Have rubber bottoms. Are comfortable and fit you well. Are closed at the toe. Do not wear sandals. If you use a stepladder: Make sure that it is fully opened. Do not climb a closed stepladder. Make sure that both sides of the stepladder are locked into place. Ask someone to hold it for you, if possible. Clearly mark and make sure that you can see: Any grab bars or handrails. First and last steps. Where the edge of each step is. Use tools that help you move around (mobility aids) if they are needed. These include: Canes. Walkers. Scooters. Crutches. Turn on the lights when you go into a dark area. Replace any light bulbs as soon as they burn out. Set up your furniture so you have a clear path. Avoid moving your furniture around. If any of your floors are uneven, fix them. If there are any pets around you, be aware of where they are. Review your medicines with your doctor. Some medicines can make you feel dizzy. This can increase your chance of falling. Ask your doctor what other things that you can do to help prevent falls. This information is not intended to replace advice given to you by your health care provider. Make sure you discuss any questions you have with your health care provider. Document Released: 09/11/2009 Document Revised: 04/22/2016 Document Reviewed: 12/20/2014 Elsevier Interactive Patient Education  2017 [employer].

## 2022-11-15 NOTE — Progress Notes (Signed)
I connected with  Bailey [name] on 11/15/22 by a audio enabled telemedicine application and verified that I am speaking with the correct person using two identifiers.  Patient Location: Home  Provider Location: Office/Clinic  I discussed the limitations of evaluation and management by telemedicine. The patient expressed understanding and agreed to proceed.   Subjective:   Bailey [name] is a 88 y.o. female who presents for Medicare Annual (Subsequent) preventive examination.  Review of Systems     Cardiac Risk Factors include: advanced age (>49mn, >>7women);hypertension     Objective:    Today's Vitals   11/15/22 1143  Weight: 111 lb (50.3 kg)   Body mass index is 20.3 kg/m.     11/15/2022   11:48 AM 11/05/2021    1:54 PM 10/16/2020    2:00 PM 09/14/2019    1:28 PM 01/28/2018    5:45 PM 12/09/2017    8:13 AM 09/17/2016    2:50 PM  Advanced Directives  Does Patient Have a Medical Advance Directive? Yes Yes Yes Yes No Yes Yes  Type of [place] will Healthcare Power of [place]ast[place] will Living will;Healthcare Power of Attorney     Does patient want to make changes to medical advance directive?    No - Patient declined     Copy of HScraperin Chart? No - copy requested No - copy requested No - copy requested No - copy requested       Current Medications (verified) Outpatient Encounter Medications as of 11/15/2022  Medication Sig   aspirin 81 MG tablet Take 81 mg by mouth daily.   carvedilol (COREG) 3.125 MG tablet TAKE 2 TABLETS BY MOUTH IN THE AM AND 1 TABLET BY MOUTH IN THE EVENING.   Cholecalciferol (VITAMIN D) 400 UNITS capsule Take 2 capsules by mouth daily   cycloSPORINE (RESTASIS) 0.05 % ophthalmic emulsion Place 1 drop into both eyes 2 (two) times daily.   escitalopram (LEXAPRO) 10 MG tablet TAKE 1 TABLET BY MOUTH EVERY DAY   estrogen, conjugated,-medroxyprogesterone  (PREMPRO) 0.45-1.5 MG tablet Take 1 tablet by mouth daily.   fish oil-omega-3 fatty acids 1000 MG capsule Take one capsule by mouth  daily   losartan (COZAAR) 100 MG tablet TAKE 1 TABLET BY MOUTH EVERY DAY   No facility-administered encounter medications on file as of 11/15/2022.    Allergies (verified) Diamox [acetazolamide], Epinephrine-lidocaine-na metabisulfite [lidocaine-epinephrine], Erythromycin, Penicillins, Sulfonamide derivatives, and Tape   History: Past Medical History:  Diagnosis Date   Allergy, unspecified not elsewhere classified    Anemia, unspecified    Benign neoplasm of colon    Cancer ([place]    basal/sqamous/transitional melanoma   CKD (chronic kidney disease), stage III ([place] 12/08/2017   COLONIC POLYPS 09/04/2008   Was told by Gi does not need anymore.     Diverticulosis of colon (without mention of hemorrhage)    Dry eye syndrome    Dysrhythmia    PVCs   Esophageal reflux    Hepatitis 1950s   infectious from food   History of atrial fibrillation    Hypertension    Irritable bowel syndrome    Mitral valve disorders(424.0)    Moderate aortic regurgitation 12/08/2017   Osteopenia 06/2018   T score -1.8 FRAX 10% / 2.5%   Palpitations    Premature ventricular contractions    Rheumatoid arthritis(714.0)    Sicca syndrome ([place]    Ulcer 1992   Vaginal  delivery 1959, 1960, 1962, 1964   Past Surgical History:  Procedure Laterality Date   APPENDECTOMY     CATARACT EXTRACTION     CHOLECYSTECTOMY     colon tumor  1969   Benign rectal tumor excised   DILATATION & CURETTAGE/HYSTEROSCOPY WITH MYOSURE N/A 01/21/2015   Procedure: [place];  Surgeon: [name], MD;  Location: [facility];  Service: Gynecology;  Laterality: N/A;   HIATAL HERNIA REPAIR     REFRACTIVE SURGERY     ROTATOR CUFF REPAIR  2009   right by Dr. French Ana   SKIN BIOPSY  11/2020   squamous cell removed  2013   Dr. Sherrye Payor --removed from her right  cheek   TUBAL LIGATION     Family History  Problem Relation Age of Onset   Stroke Mother    Hypertension Mother    Ulcerative colitis Father    Heart attack Father    Breast cancer Maternal Grandmother        Age 25's   Uterine cancer Maternal Grandmother    Hypertension Maternal Grandfather    Colon cancer Paternal Grandmother    Social History   Socioeconomic History   Marital status: Married    Spouse name: Doren Custard   Number of children: 4   Years of education: Not on file   Highest education level: Not on file  Occupational History   Occupation: retired  Tobacco Use   Smoking status: Former    Packs/day: 1.00    Years: 9.00    Total pack years: 9.00    Types: Cigarettes    Quit date: 11/30/1963    Years since quitting: 59.0    Passive exposure: Past   Smokeless tobacco: Never  Vaping Use   Vaping Use: Never used  Substance and Sexual Activity   Alcohol use: Yes    Alcohol/week: 7.0 standard drinks of alcohol    Types: 7 Standard drinks or equivalent per week    Comment: daily    Drug use: Never   Sexual activity: Not Currently    Birth control/protection: Post-menopausal    Comment: 1st intercourse 40 yo-1 partner  Other Topics Concern   Not on file  Social History Narrative   Married 1958. 4 children. 10 grandkids (1 trying to go to medical school). No greatgrandkids.       Retired from nursing (medical and surgical, most recently in cardiology)   Degree in history and english after finished nursing- 2 mo before 72th birthday      Hobbies: education, travel, reading, piano    Social Determinants of Health   Financial Resource Strain: Low Risk  (11/15/2022)   Overall Financial Resource Strain (CARDIA)    Difficulty of Paying Living Expenses: Not hard at all  Food Insecurity: No Food Insecurity (11/15/2022)   Hunger Vital Sign    Worried About Running Out of Food in the Last Year: Never true    [place] in the Last Year: Never true   Transportation Needs: No Transportation Needs (11/15/2022)   PRAPARE - Hydrologist (Medical): No    Lack of Transportation (Non-Medical): No  Physical Activity: Insufficiently Active (11/15/2022)   Exercise Vital Sign    Days of Exercise per Week: 3 days    Minutes of Exercise per Session: 30 min  Stress: Stress Concern Present (11/15/2022)   [place]    Feeling of Stress :  To some extent  Social Connections: Moderately Isolated (11/15/2022)   Social Connection and Isolation Panel [NHANES]    Frequency of Communication with Friends and Family: More than three times a week    Frequency of Social Gatherings with Friends and Family: Once a week    Attends Religious Services: Never    Marine scientist or Organizations: No    Attends Music therapist: Never    Marital Status: Married    Tobacco Counseling Counseling given: Not Answered   Clinical Intake:  Pre-visit preparation completed: Yes  Pain : No/denies pain     BMI - recorded: 20.3 Nutritional Status: BMI of 19-24  Normal Nutritional Risks: None Diabetes: No  How often do you need to have someone help you when you read instructions, pamphlets, or other written materials from your doctor or pharmacy?: 1 - Never  Diabetic?no  Interpreter Needed?: No  Information entered by :: Charlott Rakes, LPN   Activities of Daily Living    11/15/2022   11:49 AM  In your present state of health, do you have any difficulty performing the following activities:  Hearing? 0  Vision? 0  Difficulty concentrating or making decisions? 0  Walking or climbing stairs? 0  Comment no stairs to climb  Dressing or bathing? 0  Doing errands, shopping? 0  Preparing Food and eating ? N  Using the Toilet? N  In the past six months, have you accidently leaked urine? N  Do you have problems with loss of bowel control? N   Managing your Medications? N  Managing your Finances? N  Housekeeping or managing your Housekeeping? N    Patient Care Team: Marin Olp, MD as PCP - General (Family Medicine) Josue Hector, MD as PCP - Cardiology (Cardiology) Fontaine, Belinda Block, MD (Inactive) as Consulting Physician (Gynecology) Bo Merino, MD as Consulting Physician (Rheumatology) Rutherford Guys, MD as Consulting Physician (Ophthalmology) Harriett Sine, MD as Consulting Physician (Dermatology)  Indicate any recent Medical Services you may have received from other than [facility] providers in the past year (date may be approximate).     Assessment:   This is a routine wellness examination for [place].  Hearing/Vision screen Hearing Screening - Comments:: Pt denies any hearing issues  Vision Screening - Comments:: Pt follows up with Dr Gershon Crane for annul eye exams   Dietary issues and exercise activities discussed: Current Exercise Habits: Home exercise routine, Type of exercise: Other - see comments, Time (Minutes): 30, Frequency (Times/Week): 3, Weekly Exercise (Minutes/Week): 90   Goals Addressed             This Visit's Progress    Patient Stated       Get rid of cane        Depression Screen    11/15/2022   11:47 AM 11/05/2021    1:52 PM 06/10/2021   10:54 AM 10/16/2020    1:56 PM 06/06/2020   11:10 AM 09/14/2019    1:28 PM 12/07/2018   10:35 AM  PHQ 2/9 Scores  PHQ - 2 Score 0 0 0 1 1 0 0    Fall Risk    11/15/2022   11:49 AM 11/05/2021    1:55 PM 06/10/2021   10:54 AM 12/08/2020   10:46 AM 10/16/2020    2:01 PM  Fall Risk   Falls in the past year? 0 1 1 0 1  Number falls in past yr: 0 1 0 0 1  Injury with Fall? 0 0  0 0 1  Comment     bruised on buttocks  Risk for fall due to : Impaired balance/gait;Impaired vision Impaired vision Other (Comment)  Impaired vision;Impaired balance/gait  Follow up Falls prevention discussed Falls prevention discussed Falls evaluation completed   Falls prevention discussed    FALL RISK PREVENTION PERTAINING TO THE HOME:  Any stairs in or around the home? No  If so, are there any without handrails? No  Home free of loose throw rugs in walkways, pet beds, electrical cords, etc? Yes  Adequate lighting in your home to reduce risk of falls? Yes   ASSISTIVE DEVICES UTILIZED TO PREVENT FALLS:  Life alert? Yes  Use of a cane, walker or w/c? Yes  Grab bars in the bathroom? Yes  Shower chair or bench in shower? Yes  Elevated toilet seat or a handicapped toilet? Yes   TIMED UP AND GO:  Was the test performed? No .   Cognitive Function:        11/15/2022   11:50 AM 11/05/2021    1:56 PM 10/16/2020    2:05 PM 09/14/2019    1:28 PM  6CIT Screen  What Year? 0 points 0 points 0 points 0 points  What month? 0 points 0 points 0 points 0 points  What time? 0 points 0 points  0 points  Count back from 20 0 points 0 points 0 points 0 points  Months in reverse 0 points 0 points 0 points 0 points  Repeat phrase 4 points 0 points 0 points 0 points  Total Score 4 points 0 points  0 points    Immunizations Immunization History  Administered Date(s) Administered   Fluad Quad(high Dose 65+) 08/21/2019   H1N1 11/26/2008   Influenza Split 08/30/2011, 10/31/2012   Influenza Whole 09/05/2008, 08/27/2009, 08/20/2010   Influenza, High Dose Seasonal PF 08/31/2016, 09/05/2017, 08/30/2018, 08/21/2022   Influenza,inj,Quad PF,6+ Mos 09/13/2013, 10/04/2014, 10/01/2015   Influenza-Unspecified 08/26/2020, 09/16/2021   Moderna Covid-19 Vaccine Bivalent Booster 27yr & up 12/03/2021   Moderna SARS-COV2 Booster Vaccination 03/30/2021, 09/20/2022   Moderna Sars-Covid-2 Vaccination 12/20/2019, 01/17/2020, 10/02/2020   Pneumococcal Conjugate-13 10/21/2014   Pneumococcal Polysaccharide-23 10/11/2015   Td 02/17/2010   Tdap 03/02/2021   Zoster, Live 11/29/2008    TDAP status: Up to date  Flu Vaccine status: Up to date  Pneumococcal vaccine  status: Up to date  Covid-19 vaccine status: Completed vaccines  Qualifies for Shingles Vaccine? Yes   Zostavax completed No   Shingrix Completed?: No.    Education has been provided regarding the importance of this vaccine. Patient has been advised to call insurance company to determine out of pocket expense if they have not yet received this vaccine. Advised may also receive vaccine at local pharmacy or Health Dept. Verbalized acceptance and understanding.  Screening Tests Health Maintenance  Topic Date Due   Zoster Vaccines- Shingrix (1 of 2) Never done   COVID-19 Vaccine (5 - 2023-24 season) 11/15/2022   Medicare Annual Wellness (AWV)  11/16/2023   DTaP/Tdap/Td (3 - Td or Tdap) 03/03/2031   Pneumonia Vaccine 88 Years old  Completed   INFLUENZA VACCINE  Completed   DEXA SCAN  Completed   HPV VACCINES  Aged Out    Health Maintenance  Health Maintenance Due  Topic Date Due   Zoster Vaccines- Shingrix (1 of 2) Never done   COVID-19 Vaccine (5 - 2023-24 season) 11/15/2022    Colorectal cancer screening: No longer required.   Mammogram status: Completed 09/08/22. Repeat every  year  Bone Density status: Completed 08/05/20. Results reflect: Bone density results: OSTEOPENIA. Repeat every 2 years.  Additional Screening:   Vision Screening: Recommended annual ophthalmology exams for early detection of glaucoma and other disorders of the eye. Is the patient up to date with their annual eye exam?  Yes  Who is the provider or what is the name of the office in which the patient attends annual eye exams? Dr Gershon Crane  If pt is not established with a provider, would they like to be referred to a provider to establish care? No .   Dental Screening: Recommended annual dental exams for proper oral hygiene  Community Resource Referral / Chronic Care Management: CRR required this visit?  No   CCM required this visit?  No      Plan:     I have personally reviewed and noted the  following in the patient's chart:   Medical and social history Use of alcohol, tobacco or illicit drugs  Current medications and supplements including opioid prescriptions. Patient is not currently taking opioid prescriptions. Functional ability and status Nutritional status Physical activity Advanced directives List of other physicians Hospitalizations, surgeries, and ER visits in previous 12 months Vitals Screenings to include cognitive, depression, and falls Referrals and appointments  In addition, I have reviewed and discussed with patient certain preventive protocols, quality metrics, and best practice recommendations. A written personalized care plan for preventive services as well as general preventive health recommendations were provided to patient.     Willette Brace, LPN   20/08/711   Nurse Notes: None

## 2022-11-24 ENCOUNTER — Ambulatory Visit (INDEPENDENT_AMBULATORY_CARE_PROVIDER_SITE_OTHER): Payer: Medicare Other | Admitting: Obstetrics & Gynecology

## 2022-11-24 ENCOUNTER — Encounter: Payer: Self-pay | Admitting: Obstetrics & Gynecology

## 2022-11-24 VITALS — BP 118/76 | HR 69 | Ht 61.0 in | Wt 110.0 lb

## 2022-11-24 DIAGNOSIS — Z01419 Encounter for gynecological examination (general) (routine) without abnormal findings: Secondary | ICD-10-CM | POA: Diagnosis not present

## 2022-11-24 DIAGNOSIS — Z780 Asymptomatic menopausal state: Secondary | ICD-10-CM

## 2022-11-24 DIAGNOSIS — M8580 Other specified disorders of bone density and structure, unspecified site: Secondary | ICD-10-CM

## 2022-11-24 NOTE — Progress Notes (Signed)
Bailey [name] 10-16-1933 [id number]   History:    88 y.o.  [id number].  Married.  Husband with Parkinson and longstanding Arthritis.   RP:  Established patient presenting for annual gyn exam    HPI: Postmenopausal/HRT.  Continues on Prempro 0.45/1.5.  She is aware of the risks of HRT use including thrombotic diseases and the breast cancer issue.  She is now ready to wean and stop.  Pap smear Neg 2012. No indication to repeat a Pap test.  Abstinent. Breasts normal.  Mammogram Neg 08/2022. Colonoscopy 2013. Osteopenia.  Last DEXA in the chart 07/2020  T score -1.6 FRAX 12% / 3.5%, which was stable from prior DEXA.  Per patient, Dexa done in 2023 at [facility], will obtain.  Health labs with Fam MD.  BMI 20.78.   Past medical history,surgical history, family history and social history were all reviewed and documented in the EPIC chart.  Gynecologic History No LMP recorded (lmp unknown). Patient is postmenopausal.  Obstetric History OB History  Gravida Para Term Preterm AB Living  '5 4 4   1 4  '$ SAB IAB Ectopic Multiple Live Births               # Outcome Date GA Lbr Len/2nd Weight Sex Delivery Anes PTL Lv  5 AB           4 Term           3 Term           2 Term           1 Term              ROS: A ROS was performed and pertinent positives and negatives are included in the history. GENERAL: No fevers or chills. HEENT: No change in vision, no earache, sore throat or sinus congestion. NECK: No pain or stiffness. CARDIOVASCULAR: No chest pain or pressure. No palpitations. PULMONARY: No shortness of breath, cough or wheeze. GASTROINTESTINAL: No abdominal pain, nausea, vomiting or diarrhea, melena or bright red blood per rectum. GENITOURINARY: No urinary frequency, urgency, hesitancy or dysuria. MUSCULOSKELETAL: No joint or muscle pain, no back pain, no recent trauma. DERMATOLOGIC: No rash, no itching, no lesions. ENDOCRINE: No polyuria, polydipsia, no heat or cold intolerance. No recent change in  weight. HEMATOLOGICAL: No anemia or easy bruising or bleeding. NEUROLOGIC: No headache, seizures, numbness, tingling or weakness. PSYCHIATRIC: No depression, no loss of interest in normal activity or change in sleep pattern.     Exam:   BP 118/76   Pulse 69   Ht '5\' 1"'$  (1.549 m)   Wt 110 lb (49.9 kg)   LMP  (LMP Unknown)   SpO2 97%   BMI 20.78 kg/m   Body mass index is 20.78 kg/m.  General appearance : Well developed well nourished female. No acute distress HEENT: Eyes: no retinal hemorrhage or exudates,  Neck supple, trachea midline, no carotid bruits, no thyroidmegaly Lungs: Clear to auscultation, no rhonchi or wheezes, or rib retractions  Heart: Regular rate and rhythm, no murmurs or gallops Breast:Examined in sitting and supine position were symmetrical in appearance, no palpable masses or tenderness,  no skin retraction, no nipple inversion, no nipple discharge, no skin discoloration, no axillary or supraclavicular lymphadenopathy Abdomen: no palpable masses or tenderness, no rebound or guarding Extremities: no edema or skin discoloration or tenderness  Pelvic: Vulva: Normal             Vagina: No gross lesions or discharge  Cervix: No gross lesions or discharge  Uterus  AV, normal size, shape and consistency, non-tender and mobile  Adnexa  Without masses or tenderness  Anus: Normal   Assessment/Plan:  88 y.o. female for annual exam   1. Well female exam with routine gynecological exam Postmenopausal/HRT.  Continues on Prempro 0.45/1.5.  She is aware of the risks of HRT use including thrombotic diseases and the breast cancer issue.  She is now ready to wean and stop.  Pap smear Neg 2012. No indication to repeat a Pap test.  Abstinent. Breasts normal.  Mammogram Neg 08/2022. Colonoscopy 2013. Osteopenia.  Last DEXA in the chart 07/2020  T score -1.6 FRAX 12% / 3.5%, which was stable from prior DEXA.  Per patient, Dexa done in 2023 at [facility], will obtain.  Health labs with Fam  MD.  BMI 20.78.  2. Postmenopause Postmenopausal/HRT.  Continues on Prempro 0.45/1.5.  She is aware of the risks of HRT use including thrombotic diseases and the breast cancer issue.  She is now ready to wean and stop. Will take 1 tab every other day x 1 month, then every 3 days until rans out and stop.  3. Osteopenia, unspecified location   Osteopenia.  Last DEXA in the chart 07/2020  T score -1.6 FRAX 12% / 3.5%, which was stable from prior DEXA.  Per patient, Dexa done in 2023 at [facility], will obtain.   Princess Bruins MD, 12:13 PM

## 2022-11-25 ENCOUNTER — Telehealth: Payer: Self-pay | Admitting: *Deleted

## 2022-11-25 NOTE — Telephone Encounter (Signed)
Patient called in triage voicemail asking to relay a message to [facility] about her visit yesterday. Left message for patient to call with her husband.

## 2022-12-02 NOTE — Telephone Encounter (Signed)
No return call, will close encounter.

## 2022-12-06 DIAGNOSIS — D2271 Melanocytic nevi of right lower limb, including hip: Secondary | ICD-10-CM | POA: Diagnosis not present

## 2022-12-06 DIAGNOSIS — D485 Neoplasm of uncertain behavior of skin: Secondary | ICD-10-CM | POA: Diagnosis not present

## 2022-12-06 DIAGNOSIS — D225 Melanocytic nevi of trunk: Secondary | ICD-10-CM | POA: Diagnosis not present

## 2022-12-06 DIAGNOSIS — L821 Other seborrheic keratosis: Secondary | ICD-10-CM | POA: Diagnosis not present

## 2022-12-06 DIAGNOSIS — Z85828 Personal history of other malignant neoplasm of skin: Secondary | ICD-10-CM | POA: Diagnosis not present

## 2022-12-06 DIAGNOSIS — L905 Scar conditions and fibrosis of skin: Secondary | ICD-10-CM | POA: Diagnosis not present

## 2022-12-08 DIAGNOSIS — Z780 Asymptomatic menopausal state: Secondary | ICD-10-CM | POA: Diagnosis not present

## 2022-12-08 DIAGNOSIS — M85852 Other specified disorders of bone density and structure, left thigh: Secondary | ICD-10-CM | POA: Diagnosis not present

## 2022-12-08 DIAGNOSIS — M85851 Other specified disorders of bone density and structure, right thigh: Secondary | ICD-10-CM | POA: Diagnosis not present

## 2022-12-10 ENCOUNTER — Encounter: Payer: Self-pay | Admitting: Obstetrics & Gynecology

## 2022-12-13 ENCOUNTER — Encounter: Payer: Self-pay | Admitting: Family Medicine

## 2022-12-20 ENCOUNTER — Other Ambulatory Visit: Payer: Self-pay | Admitting: Cardiovascular Disease

## 2022-12-28 ENCOUNTER — Other Ambulatory Visit: Payer: Self-pay

## 2022-12-28 DIAGNOSIS — Z79890 Hormone replacement therapy (postmenopausal): Secondary | ICD-10-CM

## 2022-12-28 MED ORDER — CONJ ESTROG-MEDROXYPROGEST ACE 0.45-1.5 MG PO TABS: ORAL_TABLET | ORAL | 3 refills | Status: DC

## 2022-12-28 NOTE — Telephone Encounter (Signed)
Per ML: "She is 89 yo with very good bones.  Lowest site is at -1.7 Osteopenia only.  Ca++, Vit D and weight bearing activities with be enough.  The Combipatch at this point poses significant risks of strokes and breast cancer.  So, I recommend at least weaning the [place].  Can use 1/2 patch until next year."

## 2022-12-28 NOTE — Telephone Encounter (Signed)
Looks like pt was using prempro tabs. Please confirm before I return call to pt, thank you. I have rx pending in case you would like to review as well.   Rx was approved.   Pt notified of Dr. Assunta Curtis recommendations and of Rx and she voiced understanding.

## 2022-12-28 NOTE — Telephone Encounter (Signed)
Pt LVM in triage line re: recent DEXA scan and being able to continue on her HRT.   Spoke with pt and she states that she does recall the conversation at her [facility] about weaning off of her HRT. However, she reports that the main reason she was taking her prempro was due to her bone density and that you guys had spoken about seeing how the results were going to turn out with upcoming test to then determine whether or not she should discontinue. Please advise.

## 2023-01-05 ENCOUNTER — Other Ambulatory Visit (HOSPITAL_COMMUNITY): Payer: Medicare Other

## 2023-01-12 ENCOUNTER — Other Ambulatory Visit (HOSPITAL_COMMUNITY): Payer: Medicare Other

## 2023-01-12 ENCOUNTER — Ambulatory Visit (HOSPITAL_COMMUNITY): Payer: Medicare Other | Attending: Cardiology

## 2023-01-12 DIAGNOSIS — I351 Nonrheumatic aortic (valve) insufficiency: Secondary | ICD-10-CM | POA: Insufficient documentation

## 2023-01-12 LAB — ECHOCARDIOGRAM COMPLETE
Area-P 1/2: 4.54 cm2
P 1/2 time: 392 msec
S' Lateral: 2 cm

## 2023-02-09 ENCOUNTER — Ambulatory Visit (INDEPENDENT_AMBULATORY_CARE_PROVIDER_SITE_OTHER): Payer: Medicare Other | Admitting: Family Medicine

## 2023-02-09 ENCOUNTER — Encounter: Payer: Self-pay | Admitting: Family Medicine

## 2023-02-09 ENCOUNTER — Ambulatory Visit: Payer: Medicare Other | Admitting: Family Medicine

## 2023-02-09 VITALS — BP 124/70 | HR 61 | Temp 97.0°F | Ht 61.0 in | Wt 107.2 lb

## 2023-02-09 DIAGNOSIS — I480 Paroxysmal atrial fibrillation: Secondary | ICD-10-CM

## 2023-02-09 DIAGNOSIS — I10 Essential (primary) hypertension: Secondary | ICD-10-CM | POA: Diagnosis not present

## 2023-02-09 DIAGNOSIS — M3501 Sicca syndrome with keratoconjunctivitis: Secondary | ICD-10-CM | POA: Diagnosis not present

## 2023-02-09 DIAGNOSIS — N183 Chronic kidney disease, stage 3 unspecified: Secondary | ICD-10-CM

## 2023-02-09 LAB — CBC WITH DIFFERENTIAL/PLATELET
Basophils Absolute: 0.1 10*3/uL (ref 0.0–0.1)
Basophils Relative: 0.6 % (ref 0.0–3.0)
Eosinophils Absolute: 0.1 10*3/uL (ref 0.0–0.7)
Eosinophils Relative: 0.9 % (ref 0.0–5.0)
HCT: 36.4 % (ref 36.0–46.0)
Hemoglobin: 12 g/dL (ref 12.0–15.0)
Lymphocytes Relative: 17.8 % (ref 12.0–46.0)
Lymphs Abs: 1.8 10*3/uL (ref 0.7–4.0)
MCHC: 33 g/dL (ref 30.0–36.0)
MCV: 94.1 fl (ref 78.0–100.0)
Monocytes Absolute: 1 10*3/uL (ref 0.1–1.0)
Monocytes Relative: 9.8 % (ref 3.0–12.0)
Neutro Abs: 7.4 10*3/uL (ref 1.4–7.7)
Neutrophils Relative %: 70.9 % (ref 43.0–77.0)
Platelets: 284 10*3/uL (ref 150.0–400.0)
RBC: 3.87 Mil/uL (ref 3.87–5.11)
RDW: 13.4 % (ref 11.5–15.5)
WBC: 10.4 10*3/uL (ref 4.0–10.5)

## 2023-02-09 LAB — COMPREHENSIVE METABOLIC PANEL
ALT: 14 U/L (ref 0–35)
AST: 18 U/L (ref 0–37)
Albumin: 3.7 g/dL (ref 3.5–5.2)
Alkaline Phosphatase: 58 U/L (ref 39–117)
BUN: 19 mg/dL (ref 6–23)
CO2: 29 mEq/L (ref 19–32)
Calcium: 9.7 mg/dL (ref 8.4–10.5)
Chloride: 104 mEq/L (ref 96–112)
Creatinine, Ser: 1.02 mg/dL (ref 0.40–1.20)
GFR: 49.47 mL/min — ABNORMAL LOW (ref >60.00)
Glucose, Bld: 97 mg/dL (ref 70–99)
Potassium: 5.4 mEq/L — ABNORMAL HIGH (ref 3.5–5.1)
Sodium: 140 mEq/L (ref 135–145)
Total Bilirubin: 0.6 mg/dL (ref 0.2–1.2)
Total Protein: 6.7 g/dL (ref 6.0–8.3)

## 2023-02-09 NOTE — Patient Instructions (Addendum)
Get [place] the date and brand of recent covid vaccine.  Please stop by lab before you go If you have mychart- we will send your results within 3 business days of [place] receiving them.  If you do not have mychart- we will call you about results within 5 business days of [place] receiving them.  *please also note that you will see labs on mychart as soon as they post. I will later go in and write notes on them- will say "notes from Dr. Yong Channel"   No changes today- with not crying issue we could decrease lexapro to 5 mg but hold off with husbands current health issues  Recommended follow up: Return in about 6 months (around 08/12/2023) for followup or sooner if needed.Schedule b4 you leave.

## 2023-02-09 NOTE — Progress Notes (Signed)
Phone [phone] In person visit   Subjective:   Bailey [name] is a 89 y.o. year old very pleasant female patient who presents for/with See problem oriented charting Chief Complaint  Patient presents with   Follow-up    (No mask)    Hypertension   Fall    Pt daughter states pt fell a month ago.   left ear pain    Pt c/o left ear pain   Past Medical History-  Patient Active Problem List   Diagnosis Date Noted   Sjogren's syndrome with keratoconjunctivitis sicca ([place]) 05/12/2017    Priority: High   Paroxysmal atrial fibrillation ([place]) 09/05/2008    Priority: High   CKD (chronic kidney disease), stage III ([place]) 12/08/2017    Priority: Medium    Moderate aortic regurgitation 12/08/2017    Priority: Medium    HTN (hypertension) 03/13/2015    Priority: Medium    Raynauds syndrome 10/21/2014    Priority: Medium    Primary osteoarthritis of both hands 08/21/2009    Priority: Medium    PVC (premature ventricular contraction) 06/04/2009    Priority: Medium    Mitral valve prolapse 12/25/2008    Priority: Medium    Irritable bowel syndrome 09/04/2008    Priority: Medium    Premature ventricular contractions     Priority: Low   Hepatitis     Priority: Low   History of skin cancer     Priority: Low   Primary osteoarthritis of both feet 10/26/2016    Priority: Low   Anemia 03/13/2015    Priority: Low   Recurrent cold sores 10/21/2014    Priority: Low   Edema 06/29/2011    Priority: Low   Osteopenia 09/05/2008    Priority: Low   GERD 09/04/2008    Priority: Low   ALLERGY 09/04/2008    Priority: Low   COLONIC POLYPS 09/04/2008    Priority: Low   Dry eyes, bilateral 05/15/2018   Lumbar radiculopathy 12/12/2017   Allergy    Balance disorder 10/26/2016    Medications- reviewed and updated Current Outpatient Medications  Medication Sig Dispense Refill   aspirin 81 MG tablet Take 81 mg by mouth daily.     carvedilol (COREG) 3.125 MG tablet TAKE 2 TABLETS BY MOUTH  IN THE AM AND 1 TABLET BY MOUTH IN THE EVENING. 270 tablet 2   Cholecalciferol (VITAMIN D) 400 UNITS capsule Take 2 capsules by mouth daily     cycloSPORINE (RESTASIS) 0.05 % ophthalmic emulsion Place 1 drop into both eyes 2 (two) times daily.     escitalopram (LEXAPRO) 10 MG tablet TAKE 1 TABLET BY MOUTH EVERY DAY 90 tablet 3   estrogen, conjugated,-medroxyprogesterone (PREMPRO) 0.45-1.5 MG tablet Take 1/2 tab po daily 45 tablet 3   fish oil-omega-3 fatty acids 1000 MG capsule Take one capsule by mouth  daily     losartan (COZAAR) 100 MG tablet TAKE 1 TABLET BY MOUTH EVERY DAY 90 tablet 3   No current facility-administered medications for this visit.     Objective:  BP 124/70   Pulse 61   Temp (!) 97 F (36.1 C)   Ht '5\' 1"'$  (1.549 m)   Wt 107 lb 3.2 oz (48.6 kg)   LMP  (LMP Unknown)   SpO2 97%   BMI 20.26 kg/m  Gen: NAD, resting comfortably CV: RRR no murmurs rubs or gallops Lungs: CTAB no crackles, wheeze, rhonchi Ext: no edema Skin: warm, dry Neuro: walks with cane    Assessment  and Plan   #Social update-. Moved into [place] at [facility] [address] january 2021. Husband with PSP and this had been really hard on her- on hospice now in 2024- can sometimes point but cannot speak to communicate. Had big set back with recent UTI and did not recover. Required cath and had a large volume- question of retention- apparently hands also bluish at that ime and improved. Indwelling foley at this point.  -For caregiver stress previously placed on escitalopram- she remains on this- phq9 imperfect but understandable with # of health issues he has. Not able to cry- discussed possible 5 mg dose- she declines for now -caregiver portion is the most challenging thing about her health right now  # Fall S: Unfortunately she had a fall about a month ago-tripped over her own feet (turned quickly and foot got caught and had balance issues). Patient says no injury- daughter states had a bruise on her head  though - corner of chair on scalp- states healed with ice. No ongoing headaches or blurry vision A/P: she is trying to be more cautious and using cane outside of home- encouraged cane inside the home - aides apparently have asked for her to do less and let them do their jobs- I think this would also help reduce risk  - she wants to hold off on PT for gait and balance training for now- is going to try to use cane  # Left ear pain-this has been going on a few weeks off and on- exam normal- wonder if related to sinuses- she will monitor    # History of atrial fibrillation-maintained on carvedilol and still takes aspirin but no recurrence since dental procedures related to epinephrine use in 2014. Doing well- continue current medications    #hypertension/CKD stage III S: compliant with losartan 50 mg, carvedilol 6.5 mg in the morning and 3.25 mg in the evening  Creatinine has been stable around 0.9-1 but she is rather thin. Avoid nsaids.  A/P: blood pressure stable- continue current medicines  CKD III. hopefully stable- update cmp today. Continue current meds for now  #Moderate aortic regurgitation-moderate 01/12/23 S: follows with echocardiograms with cardiology.  Wears compression hose for edema. A/P: doing well - continue to monitor    % Sjogren's and history of raynauds S: Follows closely with Dr. Estanislado Pandy- apparently diagnostic paperwork from Dr. French Ana was lost and has been told no regular follow up now at this point- hard to repeat since she is not in an active period.  On Restasis for dry eyes related to this A/P: reports stability- tympanic membrane    Recommended follow up: Return in about 6 months (around 08/12/2023) for followup or sooner if needed.Schedule b4 you leave. Future Appointments  Date Time Provider [place]  03/16/2023  1:40 PM Bo Merino, MD [facility] None  05/30/2023  9:45 AM Josue Hector, MD [id number]    Lab/Order associations:    ICD-10-CM   1. Paroxysmal atrial fibrillation (HCC)  I48.0     2. Sjogren's syndrome with keratoconjunctivitis sicca (HCC)  M35.01     3. Stage 3 chronic kidney disease, unspecified whether stage 3a or 3b CKD (HCC)  N18.30     4. Hypertension, unspecified type  I10 CBC with Differential/Platelet    Comprehensive metabolic panel      No orders of the defined types were placed in this encounter.   Return precautions advised.  Garret Reddish, MD

## 2023-02-15 ENCOUNTER — Ambulatory Visit: Payer: Medicare Other | Admitting: Family Medicine

## 2023-03-02 NOTE — Progress Notes (Deleted)
Office Visit Note  Patient: Bailey [name]             Date of Birth: 12/01/1932           MRN: [id number]             PCP: Marin Olp, MD Referring: Marin Olp, MD Visit Date: 03/16/2023 Occupation: @GUAROCC @  Subjective:  No chief complaint on file.   History of Present Illness: Bailey [name] is a 89 y.o. female ***     Activities of Daily Living:  Patient reports morning stiffness for *** {minute/hour:19697}.   Patient {ACTIONS;DENIES/REPORTS:21021675::"Denies"} nocturnal pain.  Difficulty dressing/grooming: {ACTIONS;DENIES/REPORTS:21021675::"Denies"} Difficulty climbing stairs: {ACTIONS;DENIES/REPORTS:21021675::"Denies"} Difficulty getting out of chair: {ACTIONS;DENIES/REPORTS:21021675::"Denies"} Difficulty using hands for taps, buttons, cutlery, and/or writing: {ACTIONS;DENIES/REPORTS:21021675::"Denies"}  No Rheumatology ROS completed.   PMFS History:  Patient Active Problem List   Diagnosis Date Noted   Dry eyes, bilateral 05/15/2018   Lumbar radiculopathy 12/12/2017   Allergy    CKD (chronic kidney disease), stage III 12/08/2017   Moderate aortic regurgitation 12/08/2017   Premature ventricular contractions    Hepatitis    History of skin cancer    Sjogren's syndrome with keratoconjunctivitis sicca 05/12/2017   Balance disorder 10/26/2016   Primary osteoarthritis of both feet 10/26/2016   HTN (hypertension) 03/13/2015   Anemia 03/13/2015   Raynauds syndrome 10/21/2014   Recurrent cold sores 10/21/2014   Edema 06/29/2011   Primary osteoarthritis of both hands 08/21/2009   PVC (premature ventricular contraction) 06/04/2009   Mitral valve prolapse 12/25/2008   Paroxysmal atrial fibrillation 09/05/2008   Osteopenia 09/05/2008   GERD 09/04/2008   Irritable bowel syndrome 09/04/2008   ALLERGY 09/04/2008   COLONIC POLYPS 09/04/2008    Past Medical History:  Diagnosis Date   Allergy, unspecified not elsewhere classified    Anemia, unspecified     Benign neoplasm of colon    Cancer ([place])    basal/sqamous/transitional melanoma   CKD (chronic kidney disease), stage III ([place]) 12/08/2017   COLONIC POLYPS 09/04/2008   Was told by Gi does not need anymore.     Diverticulosis of colon (without mention of hemorrhage)    Dry eye syndrome    Dysrhythmia    PVCs   Esophageal reflux    Hepatitis 1950s   infectious from food   History of atrial fibrillation    Hypertension    Irritable bowel syndrome    Mitral valve disorders(424.0)    Moderate aortic regurgitation 12/08/2017   Osteopenia 06/2018   T score -1.8 FRAX 10% / 2.5%   Palpitations    Premature ventricular contractions    Rheumatoid arthritis(714.0)    Sicca syndrome ([place])    Ulcer 1992   Vaginal delivery 1959, 1960, 1962, 1964    Family History  Problem Relation Age of Onset   Stroke Mother    Hypertension Mother    Ulcerative colitis Father    Heart attack Father    Breast cancer Maternal Grandmother        Age 39's   Uterine cancer Maternal Grandmother    Hypertension Maternal Grandfather    Colon cancer Paternal Grandmother    Past Surgical History:  Procedure Laterality Date   APPENDECTOMY     CATARACT EXTRACTION     CHOLECYSTECTOMY     colon tumor  1969   Benign rectal tumor excised   DILATATION & CURETTAGE/HYSTEROSCOPY WITH MYOSURE N/A 01/21/2015   Procedure: [place];  Surgeon: [name], MD;  Location: [facility];  Service: Gynecology;  Laterality: N/A;   HIATAL HERNIA REPAIR     REFRACTIVE SURGERY     ROTATOR CUFF REPAIR  2009   right by Dr. French Ana   SKIN BIOPSY  11/2020   squamous cell removed  2013   Dr. Sherrye Payor --removed from her right cheek   TUBAL LIGATION     Social History   Social History Narrative   Married 1958. 4 children. 10 grandkids (1 trying to go to medical school). No greatgrandkids.       Retired from nursing (medical and surgical, most recently in cardiology)   Degree in history  and english after finished nursing- 2 mo before 72th birthday      Hobbies: education, travel, reading, piano    Immunization History  Administered Date(s) Administered   Covid-19, Mrna,Vaccine(Spikevax)24yrs and older 09/27/2022   Fluad Quad(high Dose 65+) 08/21/2019   H1N1 11/26/2008   Influenza Split 08/30/2011, 10/31/2012   Influenza Whole 09/05/2008, 08/27/2009, 08/20/2010   Influenza, High Dose Seasonal PF 08/31/2016, 09/05/2017, 08/30/2018, 08/21/2022   Influenza,inj,Quad PF,6+ Mos 09/13/2013, 10/04/2014, 10/01/2015   Influenza-Unspecified 08/26/2020, 09/16/2021   Moderna Covid-19 Vaccine Bivalent Booster 40yrs & up 12/03/2021   Moderna SARS-COV2 Booster Vaccination 03/30/2021   Moderna Sars-Covid-2 Vaccination 12/20/2019, 01/17/2020, 10/02/2020   Pneumococcal Conjugate-13 10/21/2014   Pneumococcal Polysaccharide-23 10/11/2015   Td 02/17/2010   Tdap 03/02/2021   Zoster, Live 11/29/2008     Objective: Vital Signs: LMP  (LMP Unknown)    Physical Exam   Musculoskeletal Exam: ***  CDAI Exam: CDAI Score: -- Patient Global: --; Provider Global: -- Swollen: --; Tender: -- Joint Exam 03/16/2023   No joint exam has been documented for this visit   There is currently no information documented on the homunculus. Go to the Rheumatology activity and complete the homunculus joint exam.  Investigation: No additional findings.  Imaging: No results found.  Recent Labs: Lab Results  Component Value Date   WBC 10.4 02/09/2023   HGB 12.0 02/09/2023   PLT 284.0 02/09/2023   NA 140 02/09/2023   K 5.4 No hemolysis seen (H) 02/09/2023   CL 104 02/09/2023   CO2 29 02/09/2023   GLUCOSE 97 02/09/2023   BUN 19 02/09/2023   CREATININE 1.02 02/09/2023   BILITOT 0.6 02/09/2023   ALKPHOS 58 02/09/2023   AST 18 02/09/2023   ALT 14 02/09/2023   PROT 6.7 02/09/2023   ALBUMIN 3.7 02/09/2023   CALCIUM 9.7 02/09/2023   GFRAA 61 11/14/2018    Speciality Comments: No specialty  comments available.  Procedures:  No procedures performed Allergies: Diamox [acetazolamide], Epinephrine-lidocaine-na metabisulfite [lidocaine-epinephrine], Erythromycin, Penicillins, Sulfonamide derivatives, and Tape   Assessment / Plan:     Visit Diagnoses: No diagnosis found.  Orders: No orders of the defined types were placed in this encounter.  No orders of the defined types were placed in this encounter.   Face-to-face time spent with patient was *** minutes. Greater than 50% of time was spent in counseling and coordination of care.  Follow-Up Instructions: No follow-ups on file.   Bo Merino, MD  Note - This record has been created using Editor, commissioning.  Chart creation errors have been sought, but may not always  have been located. Such creation errors do not reflect on  the standard of medical care.

## 2023-03-15 ENCOUNTER — Other Ambulatory Visit: Payer: Self-pay | Admitting: Obstetrics & Gynecology

## 2023-03-15 DIAGNOSIS — Z79890 Hormone replacement therapy (postmenopausal): Secondary | ICD-10-CM

## 2023-03-16 ENCOUNTER — Ambulatory Visit: Payer: Medicare Other | Admitting: Rheumatology

## 2023-03-16 DIAGNOSIS — M7712 Lateral epicondylitis, left elbow: Secondary | ICD-10-CM

## 2023-03-16 DIAGNOSIS — M3501 Sicca syndrome with keratoconjunctivitis: Secondary | ICD-10-CM

## 2023-03-16 DIAGNOSIS — R2689 Other abnormalities of gait and mobility: Secondary | ICD-10-CM

## 2023-03-16 DIAGNOSIS — Z862 Personal history of diseases of the blood and blood-forming organs and certain disorders involving the immune mechanism: Secondary | ICD-10-CM

## 2023-03-16 DIAGNOSIS — I7300 Raynaud's syndrome without gangrene: Secondary | ICD-10-CM

## 2023-03-16 DIAGNOSIS — Z9181 History of falling: Secondary | ICD-10-CM

## 2023-03-16 DIAGNOSIS — Z8719 Personal history of other diseases of the digestive system: Secondary | ICD-10-CM

## 2023-03-16 DIAGNOSIS — Z8679 Personal history of other diseases of the circulatory system: Secondary | ICD-10-CM

## 2023-03-16 DIAGNOSIS — M19041 Primary osteoarthritis, right hand: Secondary | ICD-10-CM

## 2023-03-16 DIAGNOSIS — M8589 Other specified disorders of bone density and structure, multiple sites: Secondary | ICD-10-CM

## 2023-03-16 DIAGNOSIS — M19071 Primary osteoarthritis, right ankle and foot: Secondary | ICD-10-CM

## 2023-03-16 DIAGNOSIS — M545 Low back pain, unspecified: Secondary | ICD-10-CM

## 2023-04-18 DIAGNOSIS — R2681 Unsteadiness on feet: Secondary | ICD-10-CM | POA: Diagnosis not present

## 2023-04-18 DIAGNOSIS — M6259 Muscle wasting and atrophy, not elsewhere classified, multiple sites: Secondary | ICD-10-CM | POA: Diagnosis not present

## 2023-04-20 DIAGNOSIS — R2681 Unsteadiness on feet: Secondary | ICD-10-CM | POA: Diagnosis not present

## 2023-04-20 DIAGNOSIS — M6259 Muscle wasting and atrophy, not elsewhere classified, multiple sites: Secondary | ICD-10-CM | POA: Diagnosis not present

## 2023-04-20 NOTE — Progress Notes (Unsigned)
Office Visit Note  Patient: Bailey [name]             Date of Birth: 01/21/1933           MRN: [id number]             PCP: Shelva Majestic, MD Referring: Shelva Majestic, MD Visit Date: 05/04/2023 Occupation: @GUAROCC @  Subjective:  Fatigue   History of Present Illness: ANNIKAH DELACUEVA is a 89 y.o. female with history of sicca syndrome, osteoarthritis, and osteopenia.  Patient states that she has been using Biotene mouth rinse and Restasis for sicca symptoms relief.  She experiences intermittent arthralgias and joint stiffness especially in her hands but denies any joint swelling.  She has been using a cane to assist with ambulation and denies any recent falls.  She has been going to physical therapy to improve her balance.  She is also going to exercise class III days a week which she feels has helped with her strength and balance.  She continues to have fatigue on a daily basis and states that in the afternoons if she sits down after lunch she often times dozes off. Patient had an updated bone density in January 2024.  She is taking vitamin D supplement daily.  Activities of Daily Living:  Patient reports morning stiffness for 30 minutes.   Patient Denies nocturnal pain.  Difficulty dressing/grooming: Denies Difficulty climbing stairs: Denies Difficulty getting out of chair: Denies Difficulty using hands for taps, buttons, cutlery, and/or writing: Denies  Review of Systems  Constitutional:  Positive for fatigue.  HENT:  Positive for mouth dryness. Negative for mouth sores.   Eyes:  Positive for photophobia and dryness.  Respiratory:  Negative for shortness of breath.   Cardiovascular:  Negative for chest pain and palpitations.  Gastrointestinal:  Negative for blood in stool, constipation and diarrhea.  Endocrine: Negative for increased urination.  Genitourinary:  Negative for involuntary urination.  Musculoskeletal:  Positive for joint pain, gait problem, joint pain and morning  stiffness. Negative for joint swelling, myalgias, muscle weakness, muscle tenderness and myalgias.  Skin:  Positive for color change. Negative for rash, hair loss and sensitivity to sunlight.  Allergic/Immunologic: Negative for susceptible to infections.  Neurological:  Positive for headaches. Negative for dizziness.  Hematological:  Negative for swollen glands.  Psychiatric/Behavioral:  Positive for depressed mood. Negative for sleep disturbance. The patient is nervous/anxious.     PMFS History:  Patient Active Problem List   Diagnosis Date Noted   Dry eyes, bilateral 05/15/2018   Lumbar radiculopathy 12/12/2017   Allergy    CKD (chronic kidney disease), stage III (HCC) 12/08/2017   Moderate aortic regurgitation 12/08/2017   Premature ventricular contractions    Hepatitis    History of skin cancer    Sjogren's syndrome with keratoconjunctivitis sicca (HCC) 05/12/2017   Balance disorder 10/26/2016   Primary osteoarthritis of both feet 10/26/2016   HTN (hypertension) 03/13/2015   Anemia 03/13/2015   Raynauds syndrome 10/21/2014   Recurrent cold sores 10/21/2014   Edema 06/29/2011   Primary osteoarthritis of both hands 08/21/2009   PVC (premature ventricular contraction) 06/04/2009   Mitral valve prolapse 12/25/2008   Paroxysmal atrial fibrillation (HCC) 09/05/2008   Osteopenia 09/05/2008   GERD 09/04/2008   Irritable bowel syndrome 09/04/2008   ALLERGY 09/04/2008   COLONIC POLYPS 09/04/2008    Past Medical History:  Diagnosis Date   Allergy, unspecified not elsewhere classified    Anemia, unspecified    Benign neoplasm  of colon    Cancer (HCC)    basal/sqamous/transitional melanoma   CKD (chronic kidney disease), stage III (HCC) 12/08/2017   COLONIC POLYPS 09/04/2008   Was told by Gi does not need anymore.     Diverticulosis of colon (without mention of hemorrhage)    Dry eye syndrome    Dysrhythmia    PVCs   Esophageal reflux    Hepatitis 1950s   infectious from  food   History of atrial fibrillation    Hypertension    Irritable bowel syndrome    Mitral valve disorders(424.0)    Moderate aortic regurgitation 12/08/2017   Osteopenia 06/2018   T score -1.8 FRAX 10% / 2.5%   Palpitations    Premature ventricular contractions    Rheumatoid arthritis(714.0)    Sicca syndrome (HCC)    Ulcer 1992   Vaginal delivery 1959, 1960, 1962, 1964    Family History  Problem Relation Age of Onset   Stroke Mother    Hypertension Mother    Ulcerative colitis Father    Heart attack Father    Breast cancer Maternal Grandmother        Age 44's   Uterine cancer Maternal Grandmother    Hypertension Maternal Grandfather    Colon cancer Paternal Grandmother    Past Surgical History:  Procedure Laterality Date   APPENDECTOMY     CATARACT EXTRACTION     CHOLECYSTECTOMY     colon tumor  1969   Benign rectal tumor excised   DILATATION & CURETTAGE/HYSTEROSCOPY WITH MYOSURE N/A 01/21/2015   Procedure: DILATATION & CURETTAGE/HYSTEROSCOPY WITH MYOSURE;  Surgeon: Dara Lords, MD;  Location: [facility];  Service: Gynecology;  Laterality: N/A;   HIATAL HERNIA REPAIR     REFRACTIVE SURGERY     ROTATOR CUFF REPAIR  2009   right by Dr. Madelon Lips   SKIN BIOPSY  11/2020   squamous cell removed  2013   Dr. Londell Moh --removed from her right cheek   TUBAL LIGATION     Social History   Social History Narrative   Married 1958. 4 children. 10 grandkids (1 trying to go to medical school). No greatgrandkids.       Retired from nursing (medical and surgical, most recently in cardiology)   Degree in history and english after finished nursing- 2 mo before 72th birthday      Hobbies: education, travel, reading, piano    Immunization History  Administered Date(s) Administered   Covid-19, Mrna,Vaccine(Spikevax)66yrs and older 09/27/2022   Fluad Quad(high Dose 65+) 08/21/2019   H1N1 11/26/2008   Influenza Split 08/30/2011, 10/31/2012   Influenza Whole 09/05/2008,  08/27/2009, 08/20/2010   Influenza, High Dose Seasonal PF 08/31/2016, 09/05/2017, 08/30/2018, 08/21/2022   Influenza,inj,Quad PF,6+ Mos 09/13/2013, 10/04/2014, 10/01/2015   Influenza-Unspecified 08/26/2020, 09/16/2021   Moderna Covid-19 Vaccine Bivalent Booster 108yrs & up 12/03/2021   Moderna SARS-COV2 Booster Vaccination 03/30/2021   Moderna Sars-Covid-2 Vaccination 12/20/2019, 01/17/2020, 10/02/2020   Pneumococcal Conjugate-13 10/21/2014   Pneumococcal Polysaccharide-23 10/11/2015   Td 02/17/2010   Tdap 03/02/2021   Zoster, Live 11/29/2008     Objective: Vital Signs: BP 131/75 (BP Location: Left Arm, Patient Position: Sitting, Cuff Size: Normal)   Pulse 66   Resp 12   Ht 5\' 2"  (1.575 m)   Wt 110 lb (49.9 kg)   LMP  (LMP Unknown)   BMI 20.12 kg/m    Physical Exam Vitals and nursing note reviewed.  Constitutional:      Appearance: She is well-developed.  HENT:  Head: Normocephalic and atraumatic.  Eyes:     Conjunctiva/sclera: Conjunctivae normal.  Cardiovascular:     Rate and Rhythm: Normal rate and regular rhythm.     Heart sounds: Normal heart sounds.  Pulmonary:     Effort: Pulmonary effort is normal.     Breath sounds: Normal breath sounds.  Abdominal:     General: Bowel sounds are normal.     Palpations: Abdomen is soft.  Musculoskeletal:     Cervical back: Normal range of motion.  Lymphadenopathy:     Cervical: No cervical adenopathy.  Skin:    General: Skin is warm and dry.     Capillary Refill: Capillary refill takes less than 2 seconds.  Neurological:     Mental Status: She is alert and oriented to person, place, and time.  Psychiatric:        Behavior: Behavior normal.      Musculoskeletal Exam: C-spine has slightly limited ROM with lateral rotation.  Thoracic spine and lumbar spine have good range of motion.  Shoulder joints, elbow joints, wrist joints, MCPs, PIPs, DIPs have good range of motion with no synovitis.  PIP and DIP thickening  consistent with osteoarthritis of both hands.  Hip joints have good range of motion with no groin pain.  Knee joints have good range of motion with no warmth or effusion.  Ankle joints have good range of motion with no joint tenderness or synovitis.  CDAI Exam: CDAI Score: -- Patient Global: --; Provider Global: -- Swollen: --; Tender: -- Joint Exam 05/04/2023   No joint exam has been documented for this visit   There is currently no information documented on the homunculus. Go to the Rheumatology activity and complete the homunculus joint exam.  Investigation: No additional findings.  Imaging: No results found.  Recent Labs: Lab Results  Component Value Date   WBC 10.4 02/09/2023   HGB 12.0 02/09/2023   PLT 284.0 02/09/2023   NA 140 02/09/2023   K 5.4 No hemolysis seen (H) 02/09/2023   CL 104 02/09/2023   CO2 29 02/09/2023   GLUCOSE 97 02/09/2023   BUN 19 02/09/2023   CREATININE 1.02 02/09/2023   BILITOT 0.6 02/09/2023   ALKPHOS 58 02/09/2023   AST 18 02/09/2023   ALT 14 02/09/2023   PROT 6.7 02/09/2023   ALBUMIN 3.7 02/09/2023   CALCIUM 9.7 02/09/2023   GFRAA 61 11/14/2018    Speciality Comments: No specialty comments available.  Procedures:  No procedures performed Allergies: Diamox [acetazolamide], Epinephrine-lidocaine-na metabisulfite [lidocaine-epinephrine], Erythromycin, Penicillins, Sulfonamide derivatives, and Tape     Assessment / Plan:     Visit Diagnoses: Sicca syndrome (HCC) - Negative ANA in 2010 and positive rheumatoid factor.  -RF,-SPEP 11/17:  She continues to have chronic sicca symptoms which have been tolerable overall.  She has been using Restasis and Biotene products for symptomatic relief. Lab work from 09/15/2022 was reviewed today in the office: RF 16, complements within normal limits, ESR within normal limits, ANA negative, Ro antibody negative, La antibody negative, and SPEP did not reveal any abnormal protein bands.  Offered to update  lab work today but she has declined at this time since she has not noticed any new or worsening symptoms.  She does not require immunosuppressive therapy at this time.  She was advised to notify us if she develops any new or worsening symptoms.  She will follow-up in the office in 6 months or sooner if needed.  Raynaud's disease without gangrene: Not currently active.  No signs of sclerodactyly noted.  Primary osteoarthritis of both hands: She has PIP and DIP thickening consistent with osteoarthritis of both hands.  She experiences increased pain and stiffness especially first thing in the mornings.  No tenderness or inflammation noted today.  Complete fist formation noted. No synovitis noted.  No clinical features of rheumatoid arthritis.  Primary osteoarthritis of both feet: She experiences pain and stiffness in her toes first thing in the morning.  Discussed the importance of performing stretching exercises on a daily basis.  I also discussed the importance of wearing proper fitting shoes.  She has been wearing compression stockings daily.  Osteopenia of multiple sites -DEXA updated 12/08/22 BMD 0.656 with T-score -1.7.  Previous DEXA 08/05/2020 T-score: -1.6, BMD: 0.668 left femoral neck.  DEXA 07/26/18 L femoral neck T-score -1.8.   She is taking a daily vitamin D supplement. Using cane to assist with ambulation.  No recent falls.  Going to PT for balance and lower extremity strengthening.   Other fatigue: She has chronic fatigue on a daily basis.  Overall she has been sleeping well at night.  She is increased her activity level and has been going to exercise class III days a week as well as physical therapy to improve her balance and strength.  Other medical conditions are listed as follows:  History of hypertension: Blood pressure was 131/75 today in the office.  History of anemia  History of atrial fibrillation  Balance disorder: Patient has been going to physical therapy to improve  her balance and lower extremity strength.  She is using a cane to assist with ambulation.  History of recent fall  History of gastroesophageal reflux (GERD)  History of IBS  Orders: No orders of the defined types were placed in this encounter.  No orders of the defined types were placed in this encounter.  Follow-Up Instructions: Return in about 6 months (around 11/03/2023) for Sicca, Osteoarthritis, osteopenia.   Gearldine Bienenstock, PA-C  Note - This record has been created using Dragon software.  Chart creation errors have been sought, but may not always  have been located. Such creation errors do not reflect on  the standard of medical care.

## 2023-04-27 DIAGNOSIS — R2681 Unsteadiness on feet: Secondary | ICD-10-CM | POA: Diagnosis not present

## 2023-04-27 DIAGNOSIS — M6259 Muscle wasting and atrophy, not elsewhere classified, multiple sites: Secondary | ICD-10-CM | POA: Diagnosis not present

## 2023-04-28 DIAGNOSIS — R2681 Unsteadiness on feet: Secondary | ICD-10-CM | POA: Diagnosis not present

## 2023-04-28 DIAGNOSIS — M6259 Muscle wasting and atrophy, not elsewhere classified, multiple sites: Secondary | ICD-10-CM | POA: Diagnosis not present

## 2023-05-04 ENCOUNTER — Ambulatory Visit: Payer: Medicare Other | Attending: Rheumatology | Admitting: Physician Assistant

## 2023-05-04 ENCOUNTER — Encounter: Payer: Self-pay | Admitting: Physician Assistant

## 2023-05-04 VITALS — BP 131/75 | HR 66 | Resp 12 | Ht 62.0 in | Wt 110.0 lb

## 2023-05-04 DIAGNOSIS — I7300 Raynaud's syndrome without gangrene: Secondary | ICD-10-CM | POA: Diagnosis not present

## 2023-05-04 DIAGNOSIS — M19041 Primary osteoarthritis, right hand: Secondary | ICD-10-CM | POA: Diagnosis not present

## 2023-05-04 DIAGNOSIS — R2689 Other abnormalities of gait and mobility: Secondary | ICD-10-CM | POA: Diagnosis not present

## 2023-05-04 DIAGNOSIS — M8589 Other specified disorders of bone density and structure, multiple sites: Secondary | ICD-10-CM

## 2023-05-04 DIAGNOSIS — R5383 Other fatigue: Secondary | ICD-10-CM | POA: Diagnosis not present

## 2023-05-04 DIAGNOSIS — M19042 Primary osteoarthritis, left hand: Secondary | ICD-10-CM

## 2023-05-04 DIAGNOSIS — Z8719 Personal history of other diseases of the digestive system: Secondary | ICD-10-CM | POA: Diagnosis not present

## 2023-05-04 DIAGNOSIS — Z8679 Personal history of other diseases of the circulatory system: Secondary | ICD-10-CM

## 2023-05-04 DIAGNOSIS — Z9181 History of falling: Secondary | ICD-10-CM

## 2023-05-04 DIAGNOSIS — Z862 Personal history of diseases of the blood and blood-forming organs and certain disorders involving the immune mechanism: Secondary | ICD-10-CM | POA: Diagnosis not present

## 2023-05-04 DIAGNOSIS — M19072 Primary osteoarthritis, left ankle and foot: Secondary | ICD-10-CM | POA: Diagnosis not present

## 2023-05-04 DIAGNOSIS — M3500 Sicca syndrome, unspecified: Secondary | ICD-10-CM | POA: Diagnosis not present

## 2023-05-04 DIAGNOSIS — M19071 Primary osteoarthritis, right ankle and foot: Secondary | ICD-10-CM

## 2023-05-05 DIAGNOSIS — R2681 Unsteadiness on feet: Secondary | ICD-10-CM | POA: Diagnosis not present

## 2023-05-05 DIAGNOSIS — M6259 Muscle wasting and atrophy, not elsewhere classified, multiple sites: Secondary | ICD-10-CM | POA: Diagnosis not present

## 2023-05-06 DIAGNOSIS — M6259 Muscle wasting and atrophy, not elsewhere classified, multiple sites: Secondary | ICD-10-CM | POA: Diagnosis not present

## 2023-05-06 DIAGNOSIS — R2681 Unsteadiness on feet: Secondary | ICD-10-CM | POA: Diagnosis not present

## 2023-05-09 DIAGNOSIS — M6259 Muscle wasting and atrophy, not elsewhere classified, multiple sites: Secondary | ICD-10-CM | POA: Diagnosis not present

## 2023-05-09 DIAGNOSIS — R2681 Unsteadiness on feet: Secondary | ICD-10-CM | POA: Diagnosis not present

## 2023-05-11 DIAGNOSIS — R2681 Unsteadiness on feet: Secondary | ICD-10-CM | POA: Diagnosis not present

## 2023-05-11 DIAGNOSIS — M6259 Muscle wasting and atrophy, not elsewhere classified, multiple sites: Secondary | ICD-10-CM | POA: Diagnosis not present

## 2023-05-17 DIAGNOSIS — M6259 Muscle wasting and atrophy, not elsewhere classified, multiple sites: Secondary | ICD-10-CM | POA: Diagnosis not present

## 2023-05-17 DIAGNOSIS — R2681 Unsteadiness on feet: Secondary | ICD-10-CM | POA: Diagnosis not present

## 2023-05-19 DIAGNOSIS — R2681 Unsteadiness on feet: Secondary | ICD-10-CM | POA: Diagnosis not present

## 2023-05-19 DIAGNOSIS — M6259 Muscle wasting and atrophy, not elsewhere classified, multiple sites: Secondary | ICD-10-CM | POA: Diagnosis not present

## 2023-05-19 NOTE — Progress Notes (Signed)
Patient ID: TANICIA WEATHERWAX, female   DOB: June 09, 1933, 89 y.o.   MRN: [id number]     89 y.o. history of PVC;s, HTN and moderate AR,Sicca Syndrome  Home BP readings good and cuff accurate  Compliant with meds Some fatigue    Now living at [facility] as she had been in home for 46 years Husbands Parkinsons confines her quite a bit   TTE done 01/06/22  showed moderate AR / mild MR with normal LV size and EF 60-65%  TTE done 01/12/23 showed moderate AR/mild-mod MR EF 60-65% stable  Husband died in Feb 14, 2023 from Bulbar Palsy Still trying to settle estate She has stayed At [facility]  She has RA and recent labs negative for Sjogrens serology   Post nasal drip with cough   ROS: Denies fever, malais, weight loss, blurry vision, decreased visual acuity, cough, sputum, SOB, hemoptysis, pleuritic pain, palpitaitons, heartburn, abdominal pain, melena, lower extremity edema, claudication, or rash.  All other systems reviewed and negative  General: Vitals:   05/30/23 0950  BP: 112/72  Pulse: 63  SpO2: 95%    Affect appropriate Elderly female  HEENT: normal Neck supple with no adenopathy JVP normal no bruits no thyromegaly Lungs clear with no wheezing and good diaphragmatic motion Heart:  S1/S2 SEM and AR murmur, no rub, gallop or click PMI normal Abdomen: benighn, BS positve, no tenderness, no AAA no bruit.  No HSM or HJR Distal pulses intact with no bruits No edema Neuro non-focal No muscular weakness Healing ulcer right ankle    Current Outpatient Medications  Medication Sig Dispense Refill   aspirin 81 MG tablet Take 81 mg by mouth daily.     carvedilol (COREG) 3.125 MG tablet TAKE 2 TABLETS BY MOUTH IN THE AM AND 1 TABLET BY MOUTH IN THE EVENING. 270 tablet 2   Cholecalciferol (VITAMIN D) 400 UNITS capsule Take 2 capsules by mouth daily     cycloSPORINE (RESTASIS) 0.05 % ophthalmic emulsion Place 1 drop into both eyes 2 (two) times daily.     escitalopram (LEXAPRO) 10 MG  tablet TAKE 1 TABLET BY MOUTH EVERY DAY 90 tablet 3   estrogen, conjugated,-medroxyprogesterone (PREMPRO) 0.45-1.5 MG tablet Take 1/2 tab po daily 45 tablet 3   fish oil-omega-3 fatty acids 1000 MG capsule Take one capsule by mouth  daily     losartan (COZAAR) 100 MG tablet TAKE 1 TABLET BY MOUTH EVERY DAY 90 tablet 3   No current facility-administered medications for this visit.    Allergies  Diamox [acetazolamide], Epinephrine-lidocaine-na metabisulfite [lidocaine-epinephrine], Erythromycin, Penicillins, Sulfonamide derivatives, and Tape  Electrocardiogram:  05/30/2023 nonspecific ST changes 05/30/2023  SR rate 63 PaC low atrial foci LAD   Assessment and Plan  Palpitations:  PAC;s /PVC;s  Stable on beta blocker   AR:   Moderate compensated LV by TTE 01/12/23  on ARB  HTN:  ARB and beta blocker increased 02/13/18  Edema:  Venous wearing compression stockings improved f/u dermatology   Chest pain:  Improved normal myovue 04/05/16  observe   Anxiety:  f/u primary Lexapro and Buspar recent death of husband  Sicca Syndrome:  Consider 2nd opinion regarding non healing lichen lesion on right ankle Restasis eye drops No immuno-suppressive Rx needed at this time    F/U  in a year      Charlton Haws

## 2023-05-24 DIAGNOSIS — R2681 Unsteadiness on feet: Secondary | ICD-10-CM | POA: Diagnosis not present

## 2023-05-24 DIAGNOSIS — M6259 Muscle wasting and atrophy, not elsewhere classified, multiple sites: Secondary | ICD-10-CM | POA: Diagnosis not present

## 2023-05-26 DIAGNOSIS — M6259 Muscle wasting and atrophy, not elsewhere classified, multiple sites: Secondary | ICD-10-CM | POA: Diagnosis not present

## 2023-05-26 DIAGNOSIS — R2681 Unsteadiness on feet: Secondary | ICD-10-CM | POA: Diagnosis not present

## 2023-05-30 ENCOUNTER — Ambulatory Visit: Payer: Medicare Other | Attending: Cardiovascular Disease | Admitting: Cardiovascular Disease

## 2023-05-30 ENCOUNTER — Encounter: Payer: Self-pay | Admitting: Cardiovascular Disease

## 2023-05-30 VITALS — BP 112/72 | HR 63 | Ht 62.0 in | Wt 110.6 lb

## 2023-05-30 DIAGNOSIS — I10 Essential (primary) hypertension: Secondary | ICD-10-CM | POA: Diagnosis not present

## 2023-05-30 DIAGNOSIS — I351 Nonrheumatic aortic (valve) insufficiency: Secondary | ICD-10-CM | POA: Insufficient documentation

## 2023-05-30 NOTE — Patient Instructions (Signed)
Medication Instructions:  Your physician recommends that you continue on your current medications as directed. Please refer to the Current Medication list given to you today.  *If you need a refill on your cardiac medications before your next appointment, please call your pharmacy*  Lab Work: If you have labs (blood work) drawn today and your tests are completely normal, you will receive your results only by: MyChart Message (if you have MyChart) OR A paper copy in the mail If you have any lab test that is abnormal or we need to change your treatment, we will call you to review the results.  Testing/Procedures: None ordered today.  Follow-Up: At [facility], you and your health needs are our priority.  As part of our continuing mission to provide you with exceptional heart care, we have created designated Provider Care Teams.  These Care Teams include your primary Cardiologist (physician) and Advanced Practice Providers (APPs -  Physician Assistants and Nurse Practitioners) who all work together to provide you with the care you need, when you need it.  We recommend signing up for the patient portal called "MyChart".  Sign up information is provided on this After Visit Summary.  MyChart is used to connect with patients for Virtual Visits (Telemedicine).  Patients are able to view lab/test results, encounter notes, upcoming appointments, etc.  Non-urgent messages can be sent to your provider as well.   To learn more about what you can do with MyChart, go to [email or web address].    Your next appointment:   1 year(s)  Provider:   Peter Nishan, MD      

## 2023-05-31 DIAGNOSIS — R2681 Unsteadiness on feet: Secondary | ICD-10-CM | POA: Diagnosis not present

## 2023-05-31 DIAGNOSIS — M6259 Muscle wasting and atrophy, not elsewhere classified, multiple sites: Secondary | ICD-10-CM | POA: Diagnosis not present

## 2023-06-01 DIAGNOSIS — R2681 Unsteadiness on feet: Secondary | ICD-10-CM | POA: Diagnosis not present

## 2023-06-01 DIAGNOSIS — M6259 Muscle wasting and atrophy, not elsewhere classified, multiple sites: Secondary | ICD-10-CM | POA: Diagnosis not present

## 2023-06-07 DIAGNOSIS — M6259 Muscle wasting and atrophy, not elsewhere classified, multiple sites: Secondary | ICD-10-CM | POA: Diagnosis not present

## 2023-06-07 DIAGNOSIS — R2681 Unsteadiness on feet: Secondary | ICD-10-CM | POA: Diagnosis not present

## 2023-06-09 DIAGNOSIS — M6259 Muscle wasting and atrophy, not elsewhere classified, multiple sites: Secondary | ICD-10-CM | POA: Diagnosis not present

## 2023-06-09 DIAGNOSIS — R2681 Unsteadiness on feet: Secondary | ICD-10-CM | POA: Diagnosis not present

## 2023-06-14 DIAGNOSIS — R2681 Unsteadiness on feet: Secondary | ICD-10-CM | POA: Diagnosis not present

## 2023-06-14 DIAGNOSIS — M6259 Muscle wasting and atrophy, not elsewhere classified, multiple sites: Secondary | ICD-10-CM | POA: Diagnosis not present

## 2023-06-16 DIAGNOSIS — R2681 Unsteadiness on feet: Secondary | ICD-10-CM | POA: Diagnosis not present

## 2023-06-16 DIAGNOSIS — M6259 Muscle wasting and atrophy, not elsewhere classified, multiple sites: Secondary | ICD-10-CM | POA: Diagnosis not present

## 2023-06-21 DIAGNOSIS — R2681 Unsteadiness on feet: Secondary | ICD-10-CM | POA: Diagnosis not present

## 2023-06-21 DIAGNOSIS — M6259 Muscle wasting and atrophy, not elsewhere classified, multiple sites: Secondary | ICD-10-CM | POA: Diagnosis not present

## 2023-06-23 DIAGNOSIS — H5203 Hypermetropia, bilateral: Secondary | ICD-10-CM | POA: Diagnosis not present

## 2023-06-23 DIAGNOSIS — H353132 Nonexudative age-related macular degeneration, bilateral, intermediate dry stage: Secondary | ICD-10-CM | POA: Diagnosis not present

## 2023-06-23 DIAGNOSIS — H3500 Unspecified background retinopathy: Secondary | ICD-10-CM | POA: Diagnosis not present

## 2023-06-23 DIAGNOSIS — Z961 Presence of intraocular lens: Secondary | ICD-10-CM | POA: Diagnosis not present

## 2023-06-24 DIAGNOSIS — M6259 Muscle wasting and atrophy, not elsewhere classified, multiple sites: Secondary | ICD-10-CM | POA: Diagnosis not present

## 2023-06-24 DIAGNOSIS — R2681 Unsteadiness on feet: Secondary | ICD-10-CM | POA: Diagnosis not present

## 2023-07-10 ENCOUNTER — Other Ambulatory Visit: Payer: Self-pay | Admitting: Family Medicine

## 2023-07-13 DIAGNOSIS — L080 Pyoderma: Secondary | ICD-10-CM | POA: Diagnosis not present

## 2023-07-13 DIAGNOSIS — D485 Neoplasm of uncertain behavior of skin: Secondary | ICD-10-CM | POA: Diagnosis not present

## 2023-07-13 DIAGNOSIS — Z85828 Personal history of other malignant neoplasm of skin: Secondary | ICD-10-CM | POA: Diagnosis not present

## 2023-07-14 ENCOUNTER — Encounter (INDEPENDENT_AMBULATORY_CARE_PROVIDER_SITE_OTHER): Payer: Self-pay

## 2023-08-04 DIAGNOSIS — N189 Chronic kidney disease, unspecified: Secondary | ICD-10-CM | POA: Diagnosis not present

## 2023-08-04 DIAGNOSIS — M2011 Hallux valgus (acquired), right foot: Secondary | ICD-10-CM | POA: Diagnosis not present

## 2023-08-04 DIAGNOSIS — B351 Tinea unguium: Secondary | ICD-10-CM | POA: Diagnosis not present

## 2023-08-04 DIAGNOSIS — M79675 Pain in left toe(s): Secondary | ICD-10-CM | POA: Diagnosis not present

## 2023-08-10 ENCOUNTER — Ambulatory Visit (INDEPENDENT_AMBULATORY_CARE_PROVIDER_SITE_OTHER): Payer: Medicare Other | Admitting: Family Medicine

## 2023-08-10 ENCOUNTER — Encounter: Payer: Self-pay | Admitting: Family Medicine

## 2023-08-10 VITALS — BP 112/60 | HR 78 | Temp 97.6°F | Ht 62.0 in | Wt 111.0 lb

## 2023-08-10 DIAGNOSIS — I480 Paroxysmal atrial fibrillation: Secondary | ICD-10-CM

## 2023-08-10 DIAGNOSIS — N183 Chronic kidney disease, stage 3 unspecified: Secondary | ICD-10-CM | POA: Diagnosis not present

## 2023-08-10 DIAGNOSIS — I10 Essential (primary) hypertension: Secondary | ICD-10-CM | POA: Diagnosis not present

## 2023-08-10 LAB — COMPREHENSIVE METABOLIC PANEL
ALT: 12 U/L (ref 0–35)
AST: 16 U/L (ref 0–37)
Albumin: 3.7 g/dL (ref 3.5–5.2)
Alkaline Phosphatase: 73 U/L (ref 39–117)
BUN: 20 mg/dL (ref 6–23)
CO2: 29 meq/L (ref 19–32)
Calcium: 9.2 mg/dL (ref 8.4–10.5)
Chloride: 100 meq/L (ref 96–112)
Creatinine, Ser: 0.99 mg/dL (ref 0.40–1.20)
GFR: 51.09 mL/min — ABNORMAL LOW (ref >60.00)
Glucose, Bld: 93 mg/dL (ref 70–99)
Potassium: 4.3 meq/L (ref 3.5–5.1)
Sodium: 136 meq/L (ref 135–145)
Total Bilirubin: 0.5 mg/dL (ref 0.2–1.2)
Total Protein: 7 g/dL (ref 6.0–8.3)

## 2023-08-10 LAB — CBC WITH DIFFERENTIAL/PLATELET
Basophils Absolute: 0 10*3/uL (ref 0.0–0.1)
Basophils Relative: 0.4 % (ref 0.0–3.0)
Eosinophils Absolute: 0.3 10*3/uL (ref 0.0–0.7)
Eosinophils Relative: 2.9 % (ref 0.0–5.0)
HCT: 34.8 % — ABNORMAL LOW (ref 36.0–46.0)
Hemoglobin: 11.4 g/dL — ABNORMAL LOW (ref 12.0–15.0)
Lymphocytes Relative: 20.1 % (ref 12.0–46.0)
Lymphs Abs: 1.8 10*3/uL (ref 0.7–4.0)
MCHC: 32.8 g/dL (ref 30.0–36.0)
MCV: 94 fl (ref 78.0–100.0)
Monocytes Absolute: 1 10*3/uL (ref 0.1–1.0)
Monocytes Relative: 11.4 % (ref 3.0–12.0)
Neutro Abs: 5.7 10*3/uL (ref 1.4–7.7)
Neutrophils Relative %: 65.2 % (ref 43.0–77.0)
Platelets: 275 10*3/uL (ref 150.0–400.0)
RBC: 3.7 Mil/uL — ABNORMAL LOW (ref 3.87–5.11)
RDW: 13 % (ref 11.5–15.5)
WBC: 8.8 10*3/uL (ref 4.0–10.5)

## 2023-08-10 LAB — LIPID PANEL
Cholesterol: 151 mg/dL (ref 0–200)
HDL: 88.8 mg/dL (ref >39.00)
LDL Cholesterol: 46 mg/dL (ref 0–99)
NonHDL: 62.13
Total CHOL/HDL Ratio: 2
Triglycerides: 83 mg/dL (ref 0.0–149.0)
VLDL: 16.6 mg/dL (ref 0.0–40.0)

## 2023-08-10 NOTE — Patient Instructions (Addendum)
Let us know when you get your flu and COVID vaccines at [place].  Please stop by lab before you go If you have mychart- we will send your results within 3 business days of [place] receiving them.  If you do not have mychart- we will call you about results within 5 business days of [place] receiving them.  *please also note that you will see labs on mychart as soon as they post. I will later go in and write notes on them- will say "notes from Dr. Durene Cal"   There are no preventive care reminders to display for this patient.  Recommended follow up: Return in about 6 months (around 02/07/2024) for followup or sooner if needed.Schedule b4 you leave.

## 2023-08-10 NOTE — Progress Notes (Signed)
Phone [phone] In person visit   Subjective:   Bailey [name] is a 89 y.o. year old very pleasant female patient who presents for/with See problem oriented charting Chief Complaint  Patient presents with   Medical Management of Chronic Issues   Hypertension   Past Medical History-  Patient Active Problem List   Diagnosis Date Noted   Sjogren's syndrome with keratoconjunctivitis sicca (HCC) 05/12/2017    Priority: High   Paroxysmal atrial fibrillation (HCC) 09/05/2008    Priority: High   CKD (chronic kidney disease), stage III (HCC) 12/08/2017    Priority: Medium    Moderate aortic regurgitation 12/08/2017    Priority: Medium    HTN (hypertension) 03/13/2015    Priority: Medium    Raynauds syndrome 10/21/2014    Priority: Medium    Primary osteoarthritis of both hands 08/21/2009    Priority: Medium    PVC (premature ventricular contraction) 06/04/2009    Priority: Medium    Mitral valve prolapse 12/25/2008    Priority: Medium    Irritable bowel syndrome 09/04/2008    Priority: Medium    Premature ventricular contractions     Priority: Low   Hepatitis     Priority: Low   History of skin cancer     Priority: Low   Primary osteoarthritis of both feet 10/26/2016    Priority: Low   Anemia 03/13/2015    Priority: Low   Recurrent cold sores 10/21/2014    Priority: Low   Edema 06/29/2011    Priority: Low   Osteopenia 09/05/2008    Priority: Low   GERD 09/04/2008    Priority: Low   ALLERGY 09/04/2008    Priority: Low   COLONIC POLYPS 09/04/2008    Priority: Low   Dry eyes, bilateral 05/15/2018   Lumbar radiculopathy 12/12/2017   Allergy    Balance disorder 10/26/2016    Medications- reviewed and updated Current Outpatient Medications  Medication Sig Dispense Refill   aspirin 81 MG tablet Take 81 mg by mouth daily.     carvedilol (COREG) 3.125 MG tablet TAKE 2 TABLETS BY MOUTH IN THE AM AND 1 TABLET BY MOUTH IN THE EVENING. 270 tablet 2   Cholecalciferol  (VITAMIN D) 400 UNITS capsule Take 2 capsules by mouth daily     cycloSPORINE (RESTASIS) 0.05 % ophthalmic emulsion Place 1 drop into both eyes 2 (two) times daily.     escitalopram (LEXAPRO) 10 MG tablet TAKE 1 TABLET BY MOUTH EVERY DAY 90 tablet 3   estrogen, conjugated,-medroxyprogesterone (PREMPRO) 0.45-1.5 MG tablet Take 1/2 tab po daily 45 tablet 3   fish oil-omega-3 fatty acids 1000 MG capsule Take one capsule by mouth  daily     losartan (COZAAR) 100 MG tablet TAKE 1 TABLET BY MOUTH EVERY DAY 90 tablet 3   Multiple Vitamins-Minerals (PRESERVISION AREDS PO) Take by mouth.     No current facility-administered medications for this visit.     Objective:  BP 112/60   Pulse 78   Temp 97.6 F (36.4 C)   Ht 5\' 2"  (1.575 m)   Wt 111 lb (50.3 kg)   LMP  (LMP Unknown)   SpO2 97%   BMI 20.30 kg/m  Gen: NAD, resting comfortably CV: RRR no murmurs rubs or gallops Lungs: CTAB no crackles, wheeze, rhonchi Ext: minimal edema Skin: warm, dry     Assessment and Plan   #Social update-. Moved into [place] at [facility] january 2021. Husband with PSP  passed 02/10/23 with PSP- mourning  process  #hypertension/CKD stage III S: compliant with losartan 50 mg, carvedilol 6.5 mg in the morning and 3.25 mg in the evening  Creatinine has been stable around 0.9-1 but she is rather thin. Avoid nsaids.  BP Readings from Last 3 Encounters:  08/10/23 112/60  05/30/23 112/72  05/04/23 131/75  A/P:  stable- continue current medicines  Chronic kidney disease III- hopefully stable- update creatinine /gfr - also check cholesterol but has been incredible Lab Results  Component Value Date   CHOL 165 12/28/2021   HDL 89.70 12/28/2021   LDLCALC 60 12/28/2021   LDLDIRECT 75.0 12/08/2017   TRIG 76.0 12/28/2021   CHOLHDL 2 12/28/2021    #Paroxysmal atrial fibrillation/palpitations-follows with Dr. Eden Emms  S: Patient previously with episodes of atrial fibrillation related to epinephrine use during  dental procedures.  Has not had a known episode since 2014.  She has been continued on aspirin alone since she has been in sinus rhythm for some time- does get PVC's/PACs and carevidolol helps there too.  She is on carvedilol for blood pressure which would offer some rate control if needed but is primarily used for blood pressure A/P: doing well without obvious recurrence- continue aspirin and coreg  % Sjogren's and history of raynauds S: Follows closely with Dr. Corliss Skains- apparently diagnostic paperwork from Dr. Madelon Lips was lost and has been told no regular follow up now at this point- hard to repeat since she is not in an active period.  On Restasis for dry eyes related to this A/P: had follow up in June- conintue annual follow up    #Macular degeneration- new diagnosis in 2024 but apparently has been present. Started on vitamins (preservision) by new eye doctor (old eye doctor had not noted)  #hormone replacement therapy- Dr. Seymour Bars in December recommended coming off of prempro at December visit (patient with prior improvement in osteoporosis on medicine). She is basically down to half dose and will follow up later this year.   #Ear fullness on left- wonders about wax-small amount in both ears- she may try debrox    #Health maintenance- plans on shingrix at [facility] or [facility] -flu shot on 25th at [place] -new COVID in October or at [facility]  #anxiety- on Lexapro 10 mg and helpful- with reductions in prempro going to hold off   Recommended follow up: Return in about 6 months (around 02/07/2024) for followup or sooner if needed.Schedule b4 you leave. Future Appointments  Date Time Provider Department Center  11/09/2023  1:00 PM Pollyann Savoy, MD [facility] None    Lab/Order associations:   ICD-10-CM   1. Hypertension, unspecified type  I10 Comprehensive metabolic panel    CBC with Differential/Platelet    Lipid panel    2. Stage 3 chronic kidney disease, unspecified whether stage 3a or  3b CKD (HCC)  N18.30     3. Paroxysmal atrial fibrillation (HCC)  I48.0       No orders of the defined types were placed in this encounter.   Return precautions advised.  Tana Conch, MD

## 2023-08-17 ENCOUNTER — Other Ambulatory Visit: Payer: Self-pay | Admitting: Cardiovascular Disease

## 2023-08-25 DIAGNOSIS — Z23 Encounter for immunization: Secondary | ICD-10-CM | POA: Diagnosis not present

## 2023-08-29 ENCOUNTER — Telehealth: Payer: Self-pay | Admitting: Family Medicine

## 2023-08-29 NOTE — Telephone Encounter (Signed)
Patient called stating she wanted to let PCP know that she got her flu vaccine on 08/25/23 @ [facility].

## 2023-08-30 NOTE — Telephone Encounter (Signed)
Flu vaccine documented.

## 2023-09-07 DIAGNOSIS — Z23 Encounter for immunization: Secondary | ICD-10-CM | POA: Diagnosis not present

## 2023-09-13 ENCOUNTER — Telehealth: Payer: Self-pay | Admitting: Family Medicine

## 2023-09-13 NOTE — Telephone Encounter (Signed)
Pt received the Covid shot on 09/07/23

## 2023-09-13 NOTE — Telephone Encounter (Signed)
Documented

## 2023-09-14 ENCOUNTER — Encounter: Payer: Self-pay | Admitting: Family Medicine

## 2023-09-14 DIAGNOSIS — Z1231 Encounter for screening mammogram for malignant neoplasm of breast: Secondary | ICD-10-CM | POA: Diagnosis not present

## 2023-09-14 LAB — HM MAMMOGRAPHY

## 2023-09-29 DIAGNOSIS — H353221 Exudative age-related macular degeneration, left eye, with active choroidal neovascularization: Secondary | ICD-10-CM | POA: Diagnosis not present

## 2023-10-04 DIAGNOSIS — H353221 Exudative age-related macular degeneration, left eye, with active choroidal neovascularization: Secondary | ICD-10-CM | POA: Diagnosis not present

## 2023-10-04 DIAGNOSIS — H43813 Vitreous degeneration, bilateral: Secondary | ICD-10-CM | POA: Diagnosis not present

## 2023-10-04 DIAGNOSIS — H353112 Nonexudative age-related macular degeneration, right eye, intermediate dry stage: Secondary | ICD-10-CM | POA: Diagnosis not present

## 2023-10-04 DIAGNOSIS — Z961 Presence of intraocular lens: Secondary | ICD-10-CM | POA: Diagnosis not present

## 2023-10-04 DIAGNOSIS — H35033 Hypertensive retinopathy, bilateral: Secondary | ICD-10-CM | POA: Diagnosis not present

## 2023-10-10 DIAGNOSIS — R2689 Other abnormalities of gait and mobility: Secondary | ICD-10-CM | POA: Diagnosis not present

## 2023-10-10 DIAGNOSIS — R2681 Unsteadiness on feet: Secondary | ICD-10-CM | POA: Diagnosis not present

## 2023-10-12 DIAGNOSIS — R2689 Other abnormalities of gait and mobility: Secondary | ICD-10-CM | POA: Diagnosis not present

## 2023-10-12 DIAGNOSIS — R2681 Unsteadiness on feet: Secondary | ICD-10-CM | POA: Diagnosis not present

## 2023-10-13 ENCOUNTER — Other Ambulatory Visit: Payer: Self-pay | Admitting: Obstetrics & Gynecology

## 2023-10-13 DIAGNOSIS — Z79890 Hormone replacement therapy (postmenopausal): Secondary | ICD-10-CM

## 2023-10-13 NOTE — Telephone Encounter (Signed)
Tapered off at 2023 appointment.

## 2023-10-13 NOTE — Telephone Encounter (Signed)
Medication refill request: Prempro  Last AEX:  11/24/22 Next AEX: 11/28/23 Last MMG (if hormonal medication request): 09/14/23 Bi-rads 1 neg  Refill authorized: #[id number]

## 2023-10-13 NOTE — Telephone Encounter (Signed)
Medication refill request: Prempro  Last AEX:  11/24/22 Next AEX: 11/28/23 Last MMG (if hormonal medication request): 09/14/23 Bi-rads 1 neg  Refill authorized: #15 with 0 RF to get her to her aex

## 2023-10-17 ENCOUNTER — Ambulatory Visit: Payer: Medicare Other | Admitting: Obstetrics and Gynecology

## 2023-10-17 ENCOUNTER — Encounter: Payer: Self-pay | Admitting: Obstetrics and Gynecology

## 2023-10-17 VITALS — BP 112/74 | Ht 62.99 in | Wt 111.0 lb

## 2023-10-17 DIAGNOSIS — Z79890 Hormone replacement therapy (postmenopausal): Secondary | ICD-10-CM | POA: Diagnosis not present

## 2023-10-17 DIAGNOSIS — M8580 Other specified disorders of bone density and structure, unspecified site: Secondary | ICD-10-CM | POA: Diagnosis not present

## 2023-10-17 DIAGNOSIS — R2689 Other abnormalities of gait and mobility: Secondary | ICD-10-CM | POA: Diagnosis not present

## 2023-10-17 DIAGNOSIS — R2681 Unsteadiness on feet: Secondary | ICD-10-CM | POA: Diagnosis not present

## 2023-10-17 MED ORDER — CONJ ESTROG-MEDROXYPROGEST ACE 0.45-1.5 MG PO TABS: ORAL_TABLET | ORAL | 2 refills | Status: DC

## 2023-10-17 NOTE — Assessment & Plan Note (Signed)
Reviewed risk of continued HRT in patients over 87yo.  CV risk calculated for this patient: ~27%. Reviewed that HRT is not first line tx for osteopenia or osteoporosis. Recommend discontinuing as previously reviewed that 2023 annual exam. However pt prefers continued taper. Will taper over next 3 months and then discontinue. Patient in agreement.

## 2023-10-17 NOTE — Progress Notes (Unsigned)
89 y.o. [id number] postmenopausal female with osteopenia here for discussion regarding HRT.  Married.  Husband with Parkinson and longstanding Arthritis.   No LMP recorded (lmp unknown). Patient is postmenopausal.    She reports starting HRT to help with osteopenia a long time ago and is worried about discontinuing as her DXA have been stable recently.  Last mammogram: 2024  GYN HISTORY: Np significant history  OB History  Gravida Para Term Preterm AB Living  5 4 4   1 4   SAB IAB Ectopic Multiple Live Births               # Outcome Date GA Lbr Len/2nd Weight Sex Type Anes PTL Lv  5 AB           4 Term           3 Term           2 Term           1 Term             Past Medical History:  Diagnosis Date   Allergy, unspecified not elsewhere classified    Anemia, unspecified    Benign neoplasm of colon    Cancer (HCC)    basal/sqamous/transitional melanoma   CKD (chronic kidney disease), stage III (HCC) 12/08/2017   COLONIC POLYPS 09/04/2008   Was told by Gi does not need anymore.     Diverticulosis of colon (without mention of hemorrhage)    Dry eye syndrome    Dysrhythmia    PVCs   Esophageal reflux    Hepatitis 1950s   infectious from food   History of atrial fibrillation    Hypertension    Irritable bowel syndrome    Mitral valve disorders(424.0)    Moderate aortic regurgitation 12/08/2017   Osteopenia 06/2018   T score -1.8 FRAX 10% / 2.5%   Palpitations    Premature ventricular contractions    Rheumatoid arthritis(714.0)    Sicca syndrome (HCC)    Ulcer 1992   Vaginal delivery 1959, 1960, 1962, 1964    Past Surgical History:  Procedure Laterality Date   APPENDECTOMY     CATARACT EXTRACTION     CHOLECYSTECTOMY     colon tumor  1969   Benign rectal tumor excised   DILATATION & CURETTAGE/HYSTEROSCOPY WITH MYOSURE N/A 01/21/2015   Procedure: DILATATION & CURETTAGE/HYSTEROSCOPY WITH MYOSURE;  Surgeon: Dara Lords, MD;  Location: [facility];  Service:  Gynecology;  Laterality: N/A;   HIATAL HERNIA REPAIR     REFRACTIVE SURGERY     ROTATOR CUFF REPAIR  2009   right by Dr. Madelon Lips   SKIN BIOPSY  11/2020   squamous cell removed  2013   Dr. Londell Moh --removed from her right cheek   TUBAL LIGATION      Current Outpatient Medications on File Prior to Visit  Medication Sig Dispense Refill   aspirin 81 MG tablet Take 81 mg by mouth daily.     carvedilol (COREG) 3.125 MG tablet TAKE 2 TABLETS BY MOUTH EVERY MORNING AND TAKE 1 TABLET EVERY EVENING 270 tablet 3   Cholecalciferol (VITAMIN D) 400 UNITS capsule Take 2 capsules by mouth daily     cycloSPORINE (RESTASIS) 0.05 % ophthalmic emulsion Place 1 drop into both eyes 2 (two) times daily.     escitalopram (LEXAPRO) 10 MG tablet TAKE 1 TABLET BY MOUTH EVERY DAY 90 tablet 3   fish oil-omega-3 fatty acids 1000 MG capsule Take  one capsule by mouth  daily     losartan (COZAAR) 100 MG tablet TAKE 1 TABLET BY MOUTH EVERY DAY 90 tablet 3   Multiple Vitamins-Minerals (PRESERVISION AREDS PO) Take by mouth.     No current facility-administered medications on file prior to visit.    Allergies  Allergen Reactions   Diamox [Acetazolamide] Nausea Only   Epinephrine-Lidocaine-Na Metabisulfite [Lidocaine-Epinephrine (Pf)] Other (See Comments)    Atrial fibrillation   Erythromycin Nausea Only   Penicillins Other (See Comments)    Unsure of reaction   Sulfonamide Derivatives Nausea Only and Other (See Comments)    Causes fever, kidney problems   Tape Rash    Sensitive to skin      PE Today's Vitals   10/17/23 1118  BP: 112/74  Weight: 111 lb (50.3 kg)  Height: 5' 2.99" (1.6 m)   Body mass index is 19.67 kg/m.  Physical Exam Vitals reviewed.  Constitutional:      General: She is not in acute distress.    Appearance: Normal appearance.  HENT:     Head: Normocephalic and atraumatic.     Nose: Nose normal.  Eyes:     Extraocular Movements: Extraocular movements intact.      Conjunctiva/sclera: Conjunctivae normal.  Pulmonary:     Effort: Pulmonary effort is normal.  Musculoskeletal:        General: Normal range of motion.     Cervical back: Normal range of motion.  Neurological:     General: No focal deficit present.     Mental Status: She is alert.  Psychiatric:        Mood and Affect: Mood normal.        Behavior: Behavior normal.       Assessment and Plan:        Postmenopausal hormone replacement therapy Assessment & Plan: Reviewed risk of continued HRT in patients over 60yo.  CV risk calculated for this patient: ~27%. Reviewed that HRT is not first line tx for osteopenia or osteoporosis. Recommend discontinuing as previously reviewed that 2023 annual exam. However pt prefers continued taper. Will taper over next 3 months and then discontinue. Patient in agreement.  Orders: -     Conj Estrog-Medroxyprogest Ace; Take 1/2 tab po twice a week.  Dispense: 4 tablet; Refill: 2  Osteopenia, unspecified location Assessment & Plan: 2024 T-Score -1.7.  Continue vitamin D+Calcium Encouraged weight based exercise DXA due 2026       Rosalyn Gess, MD

## 2023-10-20 NOTE — Assessment & Plan Note (Signed)
2024 T-Score -1.7.  Continue vitamin D+Calcium Encouraged weight based exercise DXA due 2026

## 2023-10-21 DIAGNOSIS — R2689 Other abnormalities of gait and mobility: Secondary | ICD-10-CM | POA: Diagnosis not present

## 2023-10-21 DIAGNOSIS — R2681 Unsteadiness on feet: Secondary | ICD-10-CM | POA: Diagnosis not present

## 2023-10-24 DIAGNOSIS — R2689 Other abnormalities of gait and mobility: Secondary | ICD-10-CM | POA: Diagnosis not present

## 2023-10-24 DIAGNOSIS — R2681 Unsteadiness on feet: Secondary | ICD-10-CM | POA: Diagnosis not present

## 2023-10-25 DIAGNOSIS — R2681 Unsteadiness on feet: Secondary | ICD-10-CM | POA: Diagnosis not present

## 2023-10-25 DIAGNOSIS — R2689 Other abnormalities of gait and mobility: Secondary | ICD-10-CM | POA: Diagnosis not present

## 2023-10-31 NOTE — Progress Notes (Signed)
Office Visit Note  Patient: Bailey [name]             Date of Birth: 1933/10/01           MRN: [id number]             PCP: Shelva Majestic, MD Referring: Shelva Majestic, MD Visit Date: 11/09/2023 Occupation: @GUAROCC @  Subjective:  Dry mouth and dry eyes   History of Present Illness: RAYN [name] is a 89 y.o. female with sicca syndrome, Raynauds, osteoarthritis and osteopenia.  She states she continues to have dry mouth and dry eye symptoms.  The symptoms are manageable with over-the-counter products.  She continues to have mild Raynaud's symptoms.  She does not have much discomfort from osteoarthritis in her hands and her feet currently.  She has been taking calcium and vitamin D.  She exercises at [employer] 3 times a week.  She ambulates with the help of a cane.    Activities of Daily Living:  Patient reports morning stiffness for 30 minutes.   Patient Denies nocturnal pain.  Difficulty dressing/grooming: Denies Difficulty climbing stairs: Reports Difficulty getting out of chair: Denies Difficulty using hands for taps, buttons, cutlery, and/or writing: Denies  Review of Systems  Constitutional:  Positive for fatigue.  HENT:  Positive for mouth sores and mouth dryness.   Eyes:  Positive for dryness.  Respiratory:  Negative for shortness of breath.   Cardiovascular:  Positive for palpitations. Negative for chest pain.  Gastrointestinal:  Negative for blood in stool, constipation and diarrhea.  Endocrine: Negative for increased urination.  Genitourinary:  Negative for involuntary urination.  Musculoskeletal:  Positive for gait problem, myalgias, morning stiffness, muscle tenderness and myalgias. Negative for joint pain, joint pain, joint swelling and muscle weakness.  Skin:  Negative for color change, rash, hair loss and sensitivity to sunlight.  Allergic/Immunologic: Negative for susceptible to infections.  Neurological:  Positive for dizziness. Negative for headaches.   Hematological:  Negative for swollen glands.  Psychiatric/Behavioral:  Positive for depressed mood. Negative for sleep disturbance. The patient is nervous/anxious.     PMFS History:  Patient Active Problem List   Diagnosis Date Noted   Postmenopausal hormone replacement therapy 10/17/2023   Dry eyes, bilateral 05/15/2018   Lumbar radiculopathy 12/12/2017   Allergy    CKD (chronic kidney disease), stage III (HCC) 12/08/2017   Moderate aortic regurgitation 12/08/2017   Premature ventricular contractions    Hepatitis    History of skin cancer    Sjogren's syndrome with keratoconjunctivitis sicca (HCC) 05/12/2017   Balance disorder 10/26/2016   Primary osteoarthritis of both feet 10/26/2016   HTN (hypertension) 03/13/2015   Anemia 03/13/2015   Raynauds syndrome 10/21/2014   Recurrent cold sores 10/21/2014   Edema 06/29/2011   Primary osteoarthritis of both hands 08/21/2009   PVC (premature ventricular contraction) 06/04/2009   Mitral valve prolapse 12/25/2008   Paroxysmal atrial fibrillation (HCC) 09/05/2008   Osteopenia 09/05/2008   GERD 09/04/2008   Irritable bowel syndrome 09/04/2008   ALLERGY 09/04/2008   COLONIC POLYPS 09/04/2008    Past Medical History:  Diagnosis Date   Allergy, unspecified not elsewhere classified    Anemia, unspecified    Benign neoplasm of colon    Cancer (HCC)    basal/sqamous/transitional melanoma   CKD (chronic kidney disease), stage III (HCC) 12/08/2017   COLONIC POLYPS 09/04/2008   Was told by Gi does not need anymore.     Diverticulosis of colon (without mention of hemorrhage)  Dry eye syndrome    Dysrhythmia    PVCs   Esophageal reflux    Hepatitis 1950s   infectious from food   History of atrial fibrillation    Hypertension    Irritable bowel syndrome    Macular degeneration    Mitral valve disorders(424.0)    Moderate aortic regurgitation 12/08/2017   Osteopenia 06/2018   T score -1.8 FRAX 10% / 2.5%   Palpitations     Premature ventricular contractions    Rheumatoid arthritis(714.0)    Sicca syndrome (HCC)    Ulcer 1992   Vaginal delivery 1959, 1960, 1962, 1964    Family History  Problem Relation Age of Onset   Stroke Mother    Hypertension Mother    Ulcerative colitis Father    Heart attack Father    Breast cancer Maternal Grandmother        Age 79's   Uterine cancer Maternal Grandmother    Hypertension Maternal Grandfather    Colon cancer Paternal Grandmother    Past Surgical History:  Procedure Laterality Date   APPENDECTOMY     CATARACT EXTRACTION     CHOLECYSTECTOMY     colon tumor  1969   Benign rectal tumor excised   DILATATION & CURETTAGE/HYSTEROSCOPY WITH MYOSURE N/A 01/21/2015   Procedure: DILATATION & CURETTAGE/HYSTEROSCOPY WITH MYOSURE;  Surgeon: Dara Lords, MD;  Location: [facility];  Service: Gynecology;  Laterality: N/A;   eye injection Left    HIATAL HERNIA REPAIR     REFRACTIVE SURGERY     ROTATOR CUFF REPAIR  2009   right by Dr. Madelon Lips   SKIN BIOPSY  11/2020   squamous cell removed  2013   Dr. Londell Moh --removed from her right cheek   TUBAL LIGATION     Social History   Social History Narrative   Married 1958. 4 children. 10 grandkids (1 trying to go to medical school). No greatgrandkids.       Retired from nursing (medical and surgical, most recently in cardiology)   Degree in history and english after finished nursing- 2 mo before 72th birthday      Hobbies: education, travel, reading, piano    Immunization History  Administered Date(s) Administered   Fluad Quad(high Dose 65+) 08/21/2019   H1N1 11/26/2008   Influenza Split 08/30/2011, 10/31/2012   Influenza Whole 09/05/2008, 08/27/2009, 08/20/2010   Influenza, High Dose Seasonal PF 08/31/2016, 09/05/2017, 08/30/2018, 08/21/2022, 08/25/2023   Influenza,inj,Quad PF,6+ Mos 09/13/2013, 10/04/2014, 10/01/2015   Influenza-Unspecified 08/26/2020, 09/16/2021   Moderna Covid-19 Fall Seasonal Vaccine 87yrs &  older 09/27/2022   Moderna Covid-19 Vaccine Bivalent Booster 64yrs & up 12/03/2021   Moderna SARS-COV2 Booster Vaccination 03/30/2021   Moderna Sars-Covid-2 Vaccination 12/20/2019, 01/17/2020, 10/02/2020   Pfizer(Comirnaty)Fall Seasonal Vaccine 12 years and older 09/07/2023   Pneumococcal Conjugate-13 10/21/2014   Pneumococcal Polysaccharide-23 10/11/2015   Td 02/17/2010   Tdap 03/02/2021   Zoster, Live 11/29/2008     Objective: Vital Signs: BP (!) 143/79 (BP Location: Left Arm, Patient Position: Sitting, Cuff Size: Normal)   Pulse 66   Resp 14   Ht 5\' 2"  (1.575 m)   Wt 112 lb (50.8 kg)   LMP  (LMP Unknown)   BMI 20.49 kg/m    Physical Exam Vitals and nursing note reviewed.  Constitutional:      Appearance: She is well-developed.  HENT:     Head: Normocephalic and atraumatic.  Eyes:     Conjunctiva/sclera: Conjunctivae normal.  Cardiovascular:     Rate  and Rhythm: Normal rate and regular rhythm.     Heart sounds: Normal heart sounds.  Pulmonary:     Effort: Pulmonary effort is normal.     Breath sounds: Normal breath sounds.  Abdominal:     General: Bowel sounds are normal.     Palpations: Abdomen is soft.  Musculoskeletal:     Cervical back: Normal range of motion.  Lymphadenopathy:     Cervical: No cervical adenopathy.  Skin:    General: Skin is warm and dry.     Capillary Refill: Capillary refill takes less than 2 seconds.  Neurological:     Mental Status: She is alert and oriented to person, place, and time.  Psychiatric:        Behavior: Behavior normal.      Musculoskeletal Exam: She does some limitation with range of motion of the cervical spine.  There was no tenderness over thoracic or lumbar spine.  Shoulders, elbows, wrist, MCPs and PIPs with good range of motion.  Bilateral CMC PIP and DIP thickening was noted.  Hip joints were in good range of motion.  Knee joints were in good range of motion without any warmth swelling or effusion.  There was no  tenderness over ankles or MTPs.  She had ashuffling gait.  CDAI Exam: CDAI Score: -- Patient Global: --; Provider Global: -- Swollen: --; Tender: -- Joint Exam 11/09/2023   No joint exam has been documented for this visit   There is currently no information documented on the homunculus. Go to the Rheumatology activity and complete the homunculus joint exam.  Investigation: No additional findings.  Imaging: No results found.  Recent Labs: Lab Results  Component Value Date   WBC 8.8 08/10/2023   HGB 11.4 (L) 08/10/2023   PLT 275.0 08/10/2023   NA 136 08/10/2023   K 4.3 08/10/2023   CL 100 08/10/2023   CO2 29 08/10/2023   GLUCOSE 93 08/10/2023   BUN 20 08/10/2023   CREATININE 0.99 08/10/2023   BILITOT 0.5 08/10/2023   ALKPHOS 73 08/10/2023   AST 16 08/10/2023   ALT 12 08/10/2023   PROT 7.0 08/10/2023   ALBUMIN 3.7 08/10/2023   CALCIUM 9.2 08/10/2023   GFRAA 61 11/14/2018    Speciality Comments: No specialty comments available.  Procedures:  No procedures performed Allergies: Diamox [acetazolamide], Epinephrine-lidocaine-na metabisulfite [lidocaine-epinephrine (pf)], Erythromycin, Penicillins, Sulfonamide derivatives, and Tape   Assessment / Plan:     Visit Diagnoses: Sicca syndrome (HCC) - Negative ANA in 2010 and positive rheumatoid factor.  -RF,-SPEP 11/17: Patient continues to have mild dry mouth and dry eye symptoms.  Symptoms are manageable with over-the-counter products.  All autoimmune workup was negative in the past except for positive rheumatoid factor.  She had no synovitis on the examination.  Raynaud's disease without gangrene-patient states Raynaud's symptoms are currently not active.  She had good capillary refill without any nailbed capillary changes.  Primary osteoarthritis of both hands-bilateral MCP, PIP and DIP thickening was noted.  Joint protection muscle strengthening was discussed.  Primary osteoarthritis of both feet-no synovitis was noted.   Proper fitting shoes were advised.  Osteopenia of multiple sites - DEXA updated 12/08/22 BMD 0.656 with T-score -1.7.  Results were discussed with the patient.  Previous DEXA 08/05/2020 T-score: -1.6, BMD: 0.668 left femoral neck.  Use of calcium rich diet and vitamin D was advised.  She has been walking on a regular basis.  Other fatigue-she continues to have some fatigue.  History of hypertension-blood pressure  was elevated at 143/79.  Patient states her blood pressure is usually normal.  She was advised to monitor blood pressure closely.  Other medical problems are listed as follows:  History of atrial fibrillation  History of anemia  Balance disorder - Patient has been going to physical therapy to improve her balance and lower extremity strength.  She is using a cane to assist with ambulation.  History of recent fall  History of gastroesophageal reflux (GERD)  History of IBS  Orders: No orders of the defined types were placed in this encounter.  No orders of the defined types were placed in this encounter.    Follow-Up Instructions: Return in about 6 months (around 05/09/2024) for Osteoarthritis.   Pollyann Savoy, MD  Note - This record has been created using Animal nutritionist.  Chart creation errors have been sought, but may not always  have been located. Such creation errors do not reflect on  the standard of medical care.

## 2023-11-01 DIAGNOSIS — R2681 Unsteadiness on feet: Secondary | ICD-10-CM | POA: Diagnosis not present

## 2023-11-01 DIAGNOSIS — R2689 Other abnormalities of gait and mobility: Secondary | ICD-10-CM | POA: Diagnosis not present

## 2023-11-03 DIAGNOSIS — R2689 Other abnormalities of gait and mobility: Secondary | ICD-10-CM | POA: Diagnosis not present

## 2023-11-03 DIAGNOSIS — M2011 Hallux valgus (acquired), right foot: Secondary | ICD-10-CM | POA: Diagnosis not present

## 2023-11-03 DIAGNOSIS — R2681 Unsteadiness on feet: Secondary | ICD-10-CM | POA: Diagnosis not present

## 2023-11-03 DIAGNOSIS — B351 Tinea unguium: Secondary | ICD-10-CM | POA: Diagnosis not present

## 2023-11-03 DIAGNOSIS — M79675 Pain in left toe(s): Secondary | ICD-10-CM | POA: Diagnosis not present

## 2023-11-03 DIAGNOSIS — N189 Chronic kidney disease, unspecified: Secondary | ICD-10-CM | POA: Diagnosis not present

## 2023-11-07 DIAGNOSIS — H353221 Exudative age-related macular degeneration, left eye, with active choroidal neovascularization: Secondary | ICD-10-CM | POA: Diagnosis not present

## 2023-11-07 DIAGNOSIS — H43813 Vitreous degeneration, bilateral: Secondary | ICD-10-CM | POA: Diagnosis not present

## 2023-11-07 DIAGNOSIS — Z961 Presence of intraocular lens: Secondary | ICD-10-CM | POA: Diagnosis not present

## 2023-11-07 DIAGNOSIS — H353112 Nonexudative age-related macular degeneration, right eye, intermediate dry stage: Secondary | ICD-10-CM | POA: Diagnosis not present

## 2023-11-07 DIAGNOSIS — H35033 Hypertensive retinopathy, bilateral: Secondary | ICD-10-CM | POA: Diagnosis not present

## 2023-11-08 DIAGNOSIS — R2681 Unsteadiness on feet: Secondary | ICD-10-CM | POA: Diagnosis not present

## 2023-11-08 DIAGNOSIS — R2689 Other abnormalities of gait and mobility: Secondary | ICD-10-CM | POA: Diagnosis not present

## 2023-11-09 ENCOUNTER — Ambulatory Visit: Payer: Medicare Other | Attending: Rheumatology | Admitting: Rheumatology

## 2023-11-09 ENCOUNTER — Encounter: Payer: Self-pay | Admitting: Rheumatology

## 2023-11-09 VITALS — BP 143/79 | HR 66 | Resp 14 | Ht 62.0 in | Wt 112.0 lb

## 2023-11-09 DIAGNOSIS — M8589 Other specified disorders of bone density and structure, multiple sites: Secondary | ICD-10-CM

## 2023-11-09 DIAGNOSIS — R5383 Other fatigue: Secondary | ICD-10-CM

## 2023-11-09 DIAGNOSIS — Z862 Personal history of diseases of the blood and blood-forming organs and certain disorders involving the immune mechanism: Secondary | ICD-10-CM | POA: Diagnosis not present

## 2023-11-09 DIAGNOSIS — Z9181 History of falling: Secondary | ICD-10-CM

## 2023-11-09 DIAGNOSIS — M19072 Primary osteoarthritis, left ankle and foot: Secondary | ICD-10-CM | POA: Diagnosis not present

## 2023-11-09 DIAGNOSIS — R2689 Other abnormalities of gait and mobility: Secondary | ICD-10-CM | POA: Diagnosis not present

## 2023-11-09 DIAGNOSIS — M3500 Sicca syndrome, unspecified: Secondary | ICD-10-CM | POA: Diagnosis not present

## 2023-11-09 DIAGNOSIS — Z8719 Personal history of other diseases of the digestive system: Secondary | ICD-10-CM | POA: Diagnosis not present

## 2023-11-09 DIAGNOSIS — I7300 Raynaud's syndrome without gangrene: Secondary | ICD-10-CM

## 2023-11-09 DIAGNOSIS — M19071 Primary osteoarthritis, right ankle and foot: Secondary | ICD-10-CM | POA: Insufficient documentation

## 2023-11-09 DIAGNOSIS — M19041 Primary osteoarthritis, right hand: Secondary | ICD-10-CM | POA: Diagnosis not present

## 2023-11-09 DIAGNOSIS — M19042 Primary osteoarthritis, left hand: Secondary | ICD-10-CM | POA: Diagnosis not present

## 2023-11-09 DIAGNOSIS — Z8679 Personal history of other diseases of the circulatory system: Secondary | ICD-10-CM | POA: Diagnosis not present

## 2023-11-10 DIAGNOSIS — R2681 Unsteadiness on feet: Secondary | ICD-10-CM | POA: Diagnosis not present

## 2023-11-10 DIAGNOSIS — R2689 Other abnormalities of gait and mobility: Secondary | ICD-10-CM | POA: Diagnosis not present

## 2023-11-14 DIAGNOSIS — R2681 Unsteadiness on feet: Secondary | ICD-10-CM | POA: Diagnosis not present

## 2023-11-14 DIAGNOSIS — R2689 Other abnormalities of gait and mobility: Secondary | ICD-10-CM | POA: Diagnosis not present

## 2023-11-16 DIAGNOSIS — R2689 Other abnormalities of gait and mobility: Secondary | ICD-10-CM | POA: Diagnosis not present

## 2023-11-16 DIAGNOSIS — R2681 Unsteadiness on feet: Secondary | ICD-10-CM | POA: Diagnosis not present

## 2023-11-17 ENCOUNTER — Ambulatory Visit: Payer: Medicare Other

## 2023-11-17 VITALS — Wt 110.0 lb

## 2023-11-17 DIAGNOSIS — Z0000 Encounter for general adult medical examination without abnormal findings: Secondary | ICD-10-CM

## 2023-11-17 NOTE — Progress Notes (Signed)
Subjective:   Bailey [name] is a 89 y.o. female who presents for Medicare Annual (Subsequent) preventive examination.  Visit Complete: Virtual I connected with  Reva Bores on 11/17/23 by a audio enabled telemedicine application and verified that I am speaking with the correct person using two identifiers.  Patient Location: Home  Provider Location: Office/Clinic  I discussed the limitations of evaluation and management by telemedicine. The patient expressed understanding and agreed to proceed.  Vital Signs: Because this visit was a virtual/telehealth visit, some criteria may be missing or patient reported. Any vitals not documented were not able to be obtained and vitals that have been documented are patient reported.   Cardiac Risk Factors include: advanced age (>28men, >27 women);hypertension     Objective:    Today's Vitals   11/17/23 1448  Weight: 110 lb (49.9 kg)   Body mass index is 20.12 kg/m.     11/17/2023    2:55 PM 11/15/2022   11:48 AM 11/05/2021    1:54 PM 10/16/2020    2:00 PM 09/14/2019    1:28 PM 01/28/2018    5:45 PM 12/09/2017    8:13 AM  Advanced Directives  Does Patient Have a Medical Advance Directive? Yes Yes Yes Yes Yes No Yes  Type of Estate agent of [place];Living will Healthcare Power of [place];Living will Healthcare Power of [employer] of [place];Living will Living will;Healthcare Power of Attorney    Does patient want to make changes to medical advance directive?     No - Patient declined    Copy of Healthcare Power of Attorney in Chart? No - copy requested No - copy requested No - copy requested No - copy requested No - copy requested      Current Medications (verified) Outpatient Encounter Medications as of 11/17/2023  Medication Sig   aspirin 81 MG tablet Take 81 mg by mouth daily.   carvedilol (COREG) 3.125 MG tablet TAKE 2 TABLETS BY MOUTH EVERY MORNING AND TAKE 1 TABLET EVERY EVENING    Cholecalciferol (VITAMIN D) 400 UNITS capsule Take 2 capsules by mouth daily   cycloSPORINE (RESTASIS) 0.05 % ophthalmic emulsion Place 1 drop into both eyes 2 (two) times daily.   escitalopram (LEXAPRO) 10 MG tablet TAKE 1 TABLET BY MOUTH EVERY DAY   fish oil-omega-3 fatty acids 1000 MG capsule Take one capsule by mouth  daily   losartan (COZAAR) 100 MG tablet TAKE 1 TABLET BY MOUTH EVERY DAY   Multiple Vitamins-Minerals (PRESERVISION AREDS PO) Take by mouth.   estrogen, conjugated,-medroxyprogesterone (PREMPRO) 0.45-1.5 MG tablet Take 1/2 tab po twice a week.   No facility-administered encounter medications on file as of 11/17/2023.    Allergies (verified) Diamox [acetazolamide], Epinephrine-lidocaine-na metabisulfite [lidocaine-epinephrine (pf)], Erythromycin, Penicillins, Sulfonamide derivatives, and Tape   History: Past Medical History:  Diagnosis Date   Allergy, unspecified not elsewhere classified    Anemia, unspecified    Benign neoplasm of colon    Cancer (HCC)    basal/sqamous/transitional melanoma   CKD (chronic kidney disease), stage III (HCC) 12/08/2017   COLONIC POLYPS 09/04/2008   Was told by Gi does not need anymore.     Diverticulosis of colon (without mention of hemorrhage)    Dry eye syndrome    Dysrhythmia    PVCs   Esophageal reflux    Hepatitis 1950s   infectious from food   History of atrial fibrillation    Hypertension    Irritable bowel syndrome    Macular  degeneration    Mitral valve disorders(424.0)    Moderate aortic regurgitation 12/08/2017   Osteopenia 06/2018   T score -1.8 FRAX 10% / 2.5%   Palpitations    Premature ventricular contractions    Rheumatoid arthritis(714.0)    Sicca syndrome (HCC)    Ulcer 1992   Vaginal delivery 1959, 1960, 1962, 1964   Past Surgical History:  Procedure Laterality Date   APPENDECTOMY     CATARACT EXTRACTION     CHOLECYSTECTOMY     colon tumor  1969   Benign rectal tumor excised   DILATATION &  CURETTAGE/HYSTEROSCOPY WITH MYOSURE N/A 01/21/2015   Procedure: DILATATION & CURETTAGE/HYSTEROSCOPY WITH MYOSURE;  Surgeon: Dara Lords, MD;  Location: [facility];  Service: Gynecology;  Laterality: N/A;   eye injection Left    HIATAL HERNIA REPAIR     REFRACTIVE SURGERY     ROTATOR CUFF REPAIR  2009   right by Dr. Madelon Lips   SKIN BIOPSY  11/2020   squamous cell removed  2013   Dr. Londell Moh --removed from her right cheek   TUBAL LIGATION     Family History  Problem Relation Age of Onset   Stroke Mother    Hypertension Mother    Ulcerative colitis Father    Heart attack Father    Breast cancer Maternal Grandmother        Age 76's   Uterine cancer Maternal Grandmother    Hypertension Maternal Grandfather    Colon cancer Paternal Grandmother    Social History   Socioeconomic History   Marital status: Widowed    Spouse name: Aneta Mins   Number of children: 4   Years of education: Not on file   Highest education level: Not on file  Occupational History   Occupation: retired  Tobacco Use   Smoking status: Former    Current packs/day: 0.00    Average packs/day: 1 pack/day for 9.0 years (9.0 ttl pk-yrs)    Types: Cigarettes    Start date: 11/29/1954    Quit date: 11/30/1963    Years since quitting: 60.0    Passive exposure: Past   Smokeless tobacco: Never  Vaping Use   Vaping status: Never Used  Substance and Sexual Activity   Alcohol use: Yes    Alcohol/week: 7.0 standard drinks of alcohol    Types: 7 Standard drinks or equivalent per week    Comment: 1 daily   Drug use: Never   Sexual activity: Not Currently    Birth control/protection: Post-menopausal    Comment: 1st intercourse 4 yo-1 partner  Other Topics Concern   Not on file  Social History Narrative   Married 1958. 4 children. 10 grandkids (1 trying to go to medical school). No greatgrandkids.       Retired from nursing (medical and surgical, most recently in cardiology)   Degree in history and english after  finished nursing- 2 mo before 72th birthday      Hobbies: education, travel, reading, piano    Social Drivers of Corporate investment banker Strain: Low Risk  (11/15/2022)   Overall Financial Resource Strain (CARDIA)    Difficulty of Paying Living Expenses: Not hard at all  Food Insecurity: No Food Insecurity (11/15/2022)   Hunger Vital Sign    Worried About Running Out of Food in the Last Year: Never true    Ran Out of Food in the Last Year: Never true  Transportation Needs: No Transportation Needs (11/15/2022)   PRAPARE - Transportation  Lack of Transportation (Medical): No    Lack of Transportation (Non-Medical): No  Physical Activity: Insufficiently Active (11/17/2023)   Exercise Vital Sign    Days of Exercise per Week: 3 days    Minutes of Exercise per Session: 30 min  Stress: No Stress Concern Present (11/17/2023)   [employer] of Occupational Health - Occupational Stress Questionnaire    Feeling of Stress : Only a little  Social Connections: Unknown (11/17/2023)   Social Connection and Isolation Panel [NHANES]    Frequency of Communication with Friends and Family: More than three times a week    Frequency of Social Gatherings with Friends and Family: More than three times a week    Attends Religious Services: 1 to 4 times per year    Active Member of [employer] or Organizations: Not on file    Attends Banker Meetings: Not on file    Marital Status: Widowed    Tobacco Counseling Counseling given: Not Answered   Clinical Intake:  Pre-visit preparation completed: Yes  Pain : No/denies pain     BMI - recorded: 20.12 Nutritional Status: BMI of 19-24  Normal Nutritional Risks: None Diabetes: No  How often do you need to have someone help you when you read instructions, pamphlets, or other written materials from your doctor or pharmacy?: 1 - Never  Interpreter Needed?: No  Information entered by :: Lanier Ensign, LPN   Activities of Daily  Living    11/17/2023    2:50 PM  In your present state of health, do you have any difficulty performing the following activities:  Hearing? 0  Vision? 0  Difficulty concentrating or making decisions? 0  Walking or climbing stairs? 0  Comment careful  Dressing or bathing? 0  Doing errands, shopping? 0  Preparing Food and eating ? Y  Comment meals prepared mostly  Using the Toilet? N  In the past six months, have you accidently leaked urine? N  Do you have problems with loss of bowel control? N  Managing your Medications? N  Managing your Finances? N  Housekeeping or managing your Housekeeping? N    Patient Care Team: Shelva Majestic, MD as PCP - General (Family Medicine) Wendall Stade, MD as PCP - Cardiology (Cardiology) Fontaine, Nadyne Coombes, MD (Inactive) as Consulting Physician (Gynecology) Pollyann Savoy, MD as Consulting Physician (Rheumatology) Jethro Bolus, MD as Consulting Physician (Ophthalmology) Aris Lot, MD as Consulting Physician (Dermatology)  Indicate any recent Medical Services you may have received from other than [facility] providers in the past year (date may be approximate).     Assessment:   This is a routine wellness examination for [place].  Hearing/Vision screen Hearing Screening - Comments:: Pt denies any hearing issues  Vision Screening - Comments:: Pt follows up with [facility] for annual eye exams    Goals Addressed             This Visit's Progress    Patient Stated       Maintain health and activity        Depression Screen    11/17/2023    2:52 PM 02/09/2023   11:09 AM 11/15/2022   11:47 AM 11/05/2021    1:52 PM 06/10/2021   10:54 AM 10/16/2020    1:56 PM 06/06/2020   11:10 AM  PHQ 2/9 Scores  PHQ - 2 Score 1 2 0 0 0 1 1  PHQ- 9 Score  5         Fall Risk  11/17/2023    2:55 PM 10/17/2023   11:26 AM 02/09/2023   11:08 AM 11/15/2022   11:49 AM 11/05/2021    1:55 PM  Fall Risk   Falls in the past year? 1 0 1 0 1   Number falls in past yr: 1 0 0 0 1  Injury with Fall? 0 0 0 0 0  Risk for fall due to : History of fall(s);Impaired mobility No Fall Risks History of fall(s) Impaired balance/gait;Impaired vision Impaired vision  Follow up Falls prevention discussed Falls evaluation completed Falls evaluation completed Falls prevention discussed Falls prevention discussed    MEDICARE RISK AT HOME: Medicare Risk at Home Any stairs in or around the home?: No If so, are there any without handrails?: No Home free of loose throw rugs in walkways, pet beds, electrical cords, etc?: Yes Adequate lighting in your home to reduce risk of falls?: Yes Life alert?: Yes Use of a cane, walker or w/c?: Yes Grab bars in the bathroom?: Yes Shower chair or bench in shower?: Yes Elevated toilet seat or a handicapped toilet?: Yes  TIMED UP AND GO:  Was the test performed?  No    Cognitive Function:    12/09/2017    8:17 AM 09/17/2016    2:54 PM  MMSE - Mini Mental State Exam  Not completed: -- --        11/17/2023    2:56 PM 11/15/2022   11:50 AM 11/05/2021    1:56 PM 10/16/2020    2:05 PM 09/14/2019    1:28 PM  6CIT Screen  What Year? 0 points 0 points 0 points 0 points 0 points  What month? 0 points 0 points 0 points 0 points 0 points  What time? 0 points 0 points 0 points  0 points  Count back from 20 0 points 0 points 0 points 0 points 0 points  Months in reverse 0 points 0 points 0 points 0 points 0 points  Repeat phrase 0 points 4 points 0 points 0 points 0 points  Total Score 0 points 4 points 0 points  0 points    Immunizations Immunization History  Administered Date(s) Administered   Fluad Quad(high Dose 65+) 08/21/2019   H1N1 11/26/2008   Influenza Split 08/30/2011, 10/31/2012   Influenza Whole 09/05/2008, 08/27/2009, 08/20/2010   Influenza, High Dose Seasonal PF 08/31/2016, 09/05/2017, 08/30/2018, 08/21/2022, 08/25/2023   Influenza,inj,Quad PF,6+ Mos 09/13/2013, 10/04/2014, 10/01/2015    Influenza-Unspecified 08/26/2020, 09/16/2021   Moderna Covid-19 Fall Seasonal Vaccine 50yrs & older 09/27/2022   Moderna Covid-19 Vaccine Bivalent Booster 60yrs & up 12/03/2021   Moderna SARS-COV2 Booster Vaccination 03/30/2021   Moderna Sars-Covid-2 Vaccination 12/20/2019, 01/17/2020, 10/02/2020   Pfizer(Comirnaty)Fall Seasonal Vaccine 12 years and older 09/07/2023   Pneumococcal Conjugate-13 10/21/2014   Pneumococcal Polysaccharide-23 10/11/2015   Td 02/17/2010   Tdap 03/02/2021   Zoster, Live 11/29/2008    TDAP status: Due, Education has been provided regarding the importance of this vaccine. Advised may receive this vaccine at local pharmacy or Health Dept. Aware to provide a copy of the vaccination record if obtained from local pharmacy or Health Dept. Verbalized acceptance and understanding.  Flu Vaccine status: Up to date  Pneumococcal vaccine status: Up to date  Covid-19 vaccine status: Information provided on how to obtain vaccines.   Qualifies for Shingles Vaccine? Yes   Zostavax completed No   Shingrix Completed?: No.    Education has been provided regarding the importance of this vaccine. Patient has been advised to  call insurance company to determine out of pocket expense if they have not yet received this vaccine. Advised may also receive vaccine at local pharmacy or Health Dept. Verbalized acceptance and understanding.  Screening Tests Health Maintenance  Topic Date Due   Zoster Vaccines- Shingrix (1 of 2) 08/10/1952   COVID-19 Vaccine (7 - 2024-25 season) 11/02/2023   Medicare Annual Wellness (AWV)  11/16/2024   DTaP/Tdap/Td (3 - Td or Tdap) 03/03/2031   Pneumonia Vaccine 56+ Years old  Completed   INFLUENZA VACCINE  Completed   DEXA SCAN  Completed   HPV VACCINES  Aged Out    Health Maintenance  Health Maintenance Due  Topic Date Due   Zoster Vaccines- Shingrix (1 of 2) 08/10/1952   COVID-19 Vaccine (7 - 2024-25 season) 11/02/2023    Colorectal cancer  screening: No longer required.   Mammogram status: Completed 09/14/23. Repeat every year  Bone Density status: Completed 12/08/22. Results reflect: Bone density results: OSTEOPENIA. Repeat every 2 years.   Additional Screening:   Vision Screening: Recommended annual ophthalmology exams for early detection of glaucoma and other disorders of the eye. Is the patient up to date with their annual eye exam?  Yes  Who is the provider or what is the name of the office in which the patient attends annual eye exams? [facility]  If pt is not established with a provider, would they like to be referred to a provider to establish care? No .   Dental Screening: Recommended annual dental exams for proper oral hygiene    Community Resource Referral / Chronic Care Management: CRR required this visit?  No   CCM required this visit?  No     Plan:     I have personally reviewed and noted the following in the patient's chart:   Medical and social history Use of alcohol, tobacco or illicit drugs  Current medications and supplements including opioid prescriptions. Patient is not currently taking opioid prescriptions. Functional ability and status Nutritional status Physical activity Advanced directives List of other physicians Hospitalizations, surgeries, and ER visits in previous 12 months Vitals Screenings to include cognitive, depression, and falls Referrals and appointments  In addition, I have reviewed and discussed with patient certain preventive protocols, quality metrics, and best practice recommendations. A written personalized care plan for preventive services as well as general preventive health recommendations were provided to patient.     Marzella Schlein, LPN   28/31/5176   After Visit Summary: (MyChart) Due to this being a telephonic visit, the after visit summary with patients personalized plan was offered to patient via MyChart   Nurse Notes: none

## 2023-11-17 NOTE — Patient Instructions (Signed)
Bailey [name] , Thank you for taking time to come for your Medicare Wellness Visit. I appreciate your ongoing commitment to your health goals. Please review the following plan we discussed and let me know if I can assist you in the future.   Referrals/Orders/Follow-Ups/Clinician Recommendations: maintain health and activity   This is a list of the screening recommended for you and due dates:  Health Maintenance  Topic Date Due   Zoster (Shingles) Vaccine (1 of 2) 08/10/1952   COVID-19 Vaccine (7 - 2024-25 season) 11/02/2023   Medicare Annual Wellness Visit  11/16/2024   DTaP/Tdap/Td vaccine (3 - Td or Tdap) 03/03/2031   Pneumonia Vaccine  Completed   Flu Shot  Completed   DEXA scan (bone density measurement)  Completed   HPV Vaccine  Aged Out    Advanced directives: (Copy Requested) Please bring a copy of your health care power of attorney and living will to the office to be added to your chart at your convenience.  Next Medicare Annual Wellness Visit scheduled for next year: Yes

## 2023-11-18 DIAGNOSIS — R2689 Other abnormalities of gait and mobility: Secondary | ICD-10-CM | POA: Diagnosis not present

## 2023-11-18 DIAGNOSIS — R2681 Unsteadiness on feet: Secondary | ICD-10-CM | POA: Diagnosis not present

## 2023-11-28 ENCOUNTER — Encounter: Payer: Self-pay | Admitting: Obstetrics and Gynecology

## 2023-11-28 ENCOUNTER — Ambulatory Visit (INDEPENDENT_AMBULATORY_CARE_PROVIDER_SITE_OTHER): Payer: Medicare Other | Admitting: Obstetrics and Gynecology

## 2023-11-28 ENCOUNTER — Encounter: Payer: Medicare Other | Admitting: Obstetrics and Gynecology

## 2023-11-28 VITALS — BP 122/62 | HR 88 | Ht 62.5 in | Wt 112.0 lb

## 2023-11-28 DIAGNOSIS — B001 Herpesviral vesicular dermatitis: Secondary | ICD-10-CM

## 2023-11-28 DIAGNOSIS — Z01419 Encounter for gynecological examination (general) (routine) without abnormal findings: Secondary | ICD-10-CM | POA: Insufficient documentation

## 2023-11-28 DIAGNOSIS — Z9189 Other specified personal risk factors, not elsewhere classified: Secondary | ICD-10-CM

## 2023-11-28 DIAGNOSIS — Z79890 Hormone replacement therapy (postmenopausal): Secondary | ICD-10-CM | POA: Diagnosis not present

## 2023-11-28 DIAGNOSIS — R2681 Unsteadiness on feet: Secondary | ICD-10-CM | POA: Diagnosis not present

## 2023-11-28 DIAGNOSIS — R2689 Other abnormalities of gait and mobility: Secondary | ICD-10-CM | POA: Diagnosis not present

## 2023-11-28 DIAGNOSIS — M8580 Other specified disorders of bone density and structure, unspecified site: Secondary | ICD-10-CM | POA: Diagnosis not present

## 2023-11-28 NOTE — Assessment & Plan Note (Signed)
 2024 T-Score -1.7.  Continue vitamin D+Calcium Encouraged weight based exercise DXA due 2026

## 2023-11-28 NOTE — Patient Instructions (Signed)
For patients under 50-87yo, I recommend 1200mg  calcium daily and 600IU of vitamin D daily. For patients over 87yo, I recommend 1200mg  calcium daily and 800IU of vitamin D daily.  Health Maintenance, Female Adopting a healthy lifestyle and getting preventive care are important in promoting health and wellness. Ask your health care provider about: The right schedule for you to have regular tests and exams. Things you can do on your own to prevent diseases and keep yourself healthy. What should I know about diet, weight, and exercise? Eat a healthy diet  Eat a diet that includes plenty of vegetables, fruits, low-fat dairy products, and lean protein. Do not eat a lot of foods that are high in solid fats, added sugars, or sodium. Maintain a healthy weight Body mass index (BMI) is used to identify weight problems. It estimates body fat based on height and weight. Your health care provider can help determine your BMI and help you achieve or maintain a healthy weight. Get regular exercise Get regular exercise. This is one of the most important things you can do for your health. Most adults should: Exercise for at least 150 minutes each week. The exercise should increase your heart rate and make you sweat (moderate-intensity exercise). Do strengthening exercises at least twice a week. This is in addition to the moderate-intensity exercise. Spend less time sitting. Even light physical activity can be beneficial. Watch cholesterol and blood lipids Have your blood tested for lipids and cholesterol at 87 years of age, then have this test every 5 years. Have your cholesterol levels checked more often if: Your lipid or cholesterol levels are high. You are older than 87 years of age. You are at high risk for heart disease. What should I know about cancer screening? Depending on your health history and family history, you may need to have cancer screening at various ages. This may include screening  for: Breast cancer. Cervical cancer. Colorectal cancer. Skin cancer. Lung cancer. What should I know about heart disease, diabetes, and high blood pressure? Blood pressure and heart disease High blood pressure causes heart disease and increases the risk of stroke. This is more likely to develop in people who have high blood pressure readings or are overweight. Have your blood pressure checked: Every 3-5 years if you are 83-87 years of age. Every year if you are 4 years old or older. Diabetes Have regular diabetes screenings. This checks your fasting blood sugar level. Have the screening done: Once every three years after age 68 if you are at a normal weight and have a low risk for diabetes. More often and at a younger age if you are overweight or have a high risk for diabetes. What should I know about preventing infection? Hepatitis B If you have a higher risk for hepatitis B, you should be screened for this virus. Talk with your health care provider to find out if you are at risk for hepatitis B infection. Hepatitis C Testing is recommended for: Everyone born from [birth year] through 1963. Anyone with known risk factors for hepatitis C. Sexually transmitted infections (STIs) Get screened for STIs, including gonorrhea and chlamydia, if: You are sexually active and are younger than 87 years of age. You are older than 87 years of age and your health care provider tells you that you are at risk for this type of infection. Your sexual activity has changed since you were last screened, and you are at increased risk for chlamydia or gonorrhea. Ask your health care provider if  you are at risk. Ask your health care provider about whether you are at high risk for HIV. Your health care provider may recommend a prescription medicine to help prevent HIV infection. If you choose to take medicine to prevent HIV, you should first get tested for HIV. You should then be tested every 3 months for as long as you  are taking the medicine. Osteoporosis and menopause Osteoporosis is a disease in which the bones lose minerals and strength with aging. This can result in bone fractures. If you are 44 years old or older, or if you are at risk for osteoporosis and fractures, ask your health care provider if you should: Be screened for bone loss. Take a calcium or vitamin D supplement to lower your risk of fractures. Be given hormone replacement therapy (HRT) to treat symptoms of menopause. Follow these instructions at home: Alcohol use Do not drink alcohol if: Your health care provider tells you not to drink. You are pregnant, may be pregnant, or are planning to become pregnant. If you drink alcohol: Limit how much you have to: 0-1 drink a day. Know how much alcohol is in your drink. In the [place] one drink equals one 12 oz bottle of beer (355 mL), one 5 oz glass of wine (148 mL), or one 1 oz glass of hard liquor (44 mL). Lifestyle Do not use any products that contain nicotine or tobacco. These products include cigarettes, chewing tobacco, and vaping devices, such as e-cigarettes. If you need help quitting, ask your health care provider. Do not use street drugs. Do not share needles. Ask your health care provider for help if you need support or information about quitting drugs. General instructions Schedule regular health, dental, and eye exams. Stay current with your vaccines. Tell your health care provider if: You often feel depressed. You have ever been abused or do not feel safe at home. Summary Adopting a healthy lifestyle and getting preventive care are important in promoting health and wellness. Follow your health care provider's instructions about healthy diet, exercising, and getting tested or screened for diseases. Follow your health care provider's instructions on monitoring your cholesterol and blood pressure. This information is not intended to replace advice given to you by your health  care provider. Make sure you discuss any questions you have with your health care provider. Document Revised: 04/06/2021 Document Reviewed: 04/06/2021 Elsevier Patient Education  2024 [employer].

## 2023-11-28 NOTE — Assessment & Plan Note (Signed)
Cervical cancer screening completed 2012, no longer indicated given age and risk factors. Encouraged annual mammogram screening Colonoscopy per GI recommendations given patient age DXA due 2026 Labs and immunizations with her primary Encouraged safe sexual practices as indicated Encouraged healthy lifestyle practices with diet and exercise For patients over 87yo, I recommend 1200mg  calcium daily and 800IU of vitamin D daily.

## 2023-11-28 NOTE — Progress Notes (Signed)
89 y.o. [id number] postmenopausal female with osteopenia, on HRT taper to finish in Feb 2025 here for annual exam.  Married.  Husband with Parkinson and longstanding Arthritis.   Doing well on taper.  No complaints today.  Postmenopausal bleeding: None Pelvic discharge or pain: None Breast mass, nipple discharge or skin changes : None Last PAP: No results found for: "DIAGPAP", "HPVHIGH", "ADEQPAP" Last mammogram: 09/14/2023 BI-RADS 1, density B Last colonoscopy: 07/14/2012 Last DXA: 12/08/2022 Sexually active: No Exercising: Yes. Cardio and strength  Smoker: former  GYN HISTORY: No significant history  OB History  Gravida Para Term Preterm AB Living  5 4 4  1 4   SAB IAB Ectopic Multiple Live Births          # Outcome Date GA Lbr Len/2nd Weight Sex Type Anes PTL Lv  5 AB           4 Term           3 Term           2 Term           1 Term             Past Medical History:  Diagnosis Date   Allergy, unspecified not elsewhere classified    Anemia, unspecified    Benign neoplasm of colon    Cancer (HCC)    basal/sqamous/transitional melanoma   CKD (chronic kidney disease), stage III (HCC) 12/08/2017   COLONIC POLYPS 09/04/2008   Was told by Gi does not need anymore.     Diverticulosis of colon (without mention of hemorrhage)    Dry eye syndrome    Dysrhythmia    PVCs   Esophageal reflux    Hepatitis 1950s   infectious from food   History of atrial fibrillation    Hypertension    Irritable bowel syndrome    Macular degeneration    Mitral valve disorders(424.0)    Moderate aortic regurgitation 12/08/2017   Osteopenia 06/2018   T score -1.8 FRAX 10% / 2.5%   Palpitations    Premature ventricular contractions    Rheumatoid arthritis(714.0)    Sicca syndrome (HCC)    Ulcer 1992   Vaginal delivery 1959, 1960, 1962, 1964    Past Surgical History:  Procedure Laterality Date   APPENDECTOMY     CATARACT EXTRACTION     CHOLECYSTECTOMY     colon tumor  1969    Benign rectal tumor excised   DILATATION & CURETTAGE/HYSTEROSCOPY WITH MYOSURE N/A 01/21/2015   Procedure: DILATATION & CURETTAGE/HYSTEROSCOPY WITH MYOSURE;  Surgeon: Dara Lords, MD;  Location: [facility];  Service: Gynecology;  Laterality: N/A;   eye injection Left    HIATAL HERNIA REPAIR     REFRACTIVE SURGERY     ROTATOR CUFF REPAIR  2009   right by Dr. Madelon Lips   SKIN BIOPSY  11/2020   squamous cell removed  2013   Dr. Londell Moh --removed from her right cheek   TUBAL LIGATION      Current Outpatient Medications on File Prior to Visit  Medication Sig Dispense Refill   aspirin 81 MG tablet Take 81 mg by mouth daily.     carvedilol (COREG) 3.125 MG tablet TAKE 2 TABLETS BY MOUTH EVERY MORNING AND TAKE 1 TABLET EVERY EVENING 270 tablet 3   Cholecalciferol (VITAMIN D) 400 UNITS capsule Take 2 capsules by mouth daily     cycloSPORINE (RESTASIS) 0.05 % ophthalmic emulsion Place 1 drop into both eyes 2 (  two) times daily.     escitalopram (LEXAPRO) 10 MG tablet TAKE 1 TABLET BY MOUTH EVERY DAY 90 tablet 3   estrogen, conjugated,-medroxyprogesterone (PREMPRO) 0.45-1.5 MG tablet Take 1/2 tab po twice a week. 4 tablet 2   fish oil-omega-3 fatty acids 1000 MG capsule Take one capsule by mouth  daily     losartan (COZAAR) 100 MG tablet TAKE 1 TABLET BY MOUTH EVERY DAY 90 tablet 3   Multiple Vitamins-Minerals (PRESERVISION AREDS PO) Take by mouth.     No current facility-administered medications on file prior to visit.    Social History   Socioeconomic History   Marital status: Widowed    Spouse name: Aneta Mins   Number of children: 4   Years of education: Not on file   Highest education level: Not on file  Occupational History   Occupation: retired  Tobacco Use   Smoking status: Former    Current packs/day: 0.00    Average packs/day: 1 pack/day for 9.0 years (9.0 ttl pk-yrs)    Types: Cigarettes    Start date: 11/29/1954    Quit date: 11/30/1963    Years since quitting: 60.0     Passive exposure: Past   Smokeless tobacco: Never  Vaping Use   Vaping status: Never Used  Substance and Sexual Activity   Alcohol use: Yes    Alcohol/week: 7.0 standard drinks of alcohol    Types: 7 Standard drinks or equivalent per week    Comment: 1 daily   Drug use: Never   Sexual activity: Not Currently    Birth control/protection: Post-menopausal    Comment: 1st intercourse 51 yo-1 partner  Other Topics Concern   Not on file  Social History Narrative   Married 1958. 4 children. 10 grandkids (1 trying to go to medical school). No greatgrandkids.       Retired from nursing (medical and surgical, most recently in cardiology)   Degree in history and english after finished nursing- 2 mo before 72th birthday      Hobbies: education, travel, reading, piano    Social Drivers of Corporate investment banker Strain: Low Risk  (11/15/2022)   Overall Financial Resource Strain (CARDIA)    Difficulty of Paying Living Expenses: Not hard at all  Food Insecurity: No Food Insecurity (11/15/2022)   Hunger Vital Sign    Worried About Running Out of Food in the Last Year: Never true    Ran Out of Food in the Last Year: Never true  Transportation Needs: No Transportation Needs (11/15/2022)   PRAPARE - Administrator, Civil Service (Medical): No    Lack of Transportation (Non-Medical): No  Physical Activity: Insufficiently Active (11/17/2023)   Exercise Vital Sign    Days of Exercise per Week: 3 days    Minutes of Exercise per Session: 30 min  Stress: No Stress Concern Present (11/17/2023)   [employer] of Occupational Health - Occupational Stress Questionnaire    Feeling of Stress : Only a little  Social Connections: Unknown (11/17/2023)   Social Connection and Isolation Panel [NHANES]    Frequency of Communication with Friends and Family: More than three times a week    Frequency of Social Gatherings with Friends and Family: More than three times a week    Attends  Religious Services: 1 to 4 times per year    Active Member of [employer] or Organizations: Not on file    Attends Banker Meetings: Not on file    Marital  Status: Widowed  Intimate Partner Violence: Not At Risk (11/15/2022)   Humiliation, Afraid, Rape, and Kick questionnaire    Fear of Current or Ex-Partner: No    Emotionally Abused: No    Physically Abused: No    Sexually Abused: No    Family History  Problem Relation Age of Onset   Stroke Mother    Hypertension Mother    Ulcerative colitis Father    Heart attack Father    Breast cancer Maternal Grandmother        Age 74's   Uterine cancer Maternal Grandmother    Hypertension Maternal Grandfather    Colon cancer Paternal Grandmother     Allergies  Allergen Reactions   Diamox [Acetazolamide] Nausea Only   Epinephrine-Lidocaine-Na Metabisulfite [Lidocaine-Epinephrine (Pf)] Other (See Comments)    Atrial fibrillation   Erythromycin Nausea Only   Penicillins Other (See Comments)    Unsure of reaction   Sulfonamide Derivatives Nausea Only and Other (See Comments)    Causes fever, kidney problems   Tape Rash    Sensitive to skin      PE Today's Vitals   11/28/23 1349  BP: 122/62  Pulse: 88  SpO2: 96%  Weight: 112 lb (50.8 kg)  Height: 5' 2.5" (1.588 m)   Body mass index is 20.16 kg/m.  Physical Exam Vitals reviewed. Exam conducted with a chaperone present.  Constitutional:      General: She is not in acute distress.    Appearance: Normal appearance.  HENT:     Head: Normocephalic and atraumatic.     Nose: Nose normal.  Eyes:     Extraocular Movements: Extraocular movements intact.     Conjunctiva/sclera: Conjunctivae normal.  Neck:     Thyroid: No thyroid mass, thyromegaly or thyroid tenderness.  Pulmonary:     Effort: Pulmonary effort is normal.  Chest:     Chest wall: No mass or tenderness.  Breasts:    Right: Normal. No swelling, mass, nipple discharge, skin change or tenderness.     Left:  Normal. No swelling, mass, nipple discharge, skin change or tenderness.  Abdominal:     General: There is no distension.     Palpations: Abdomen is soft.     Tenderness: There is no abdominal tenderness.  Genitourinary:    General: Normal vulva.     Exam position: Lithotomy position.     Urethra: No prolapse.     Vagina: Normal. No vaginal discharge or bleeding.     Cervix: Normal. No lesion.     Uterus: Normal. Not enlarged and not tender.      Adnexa: Right adnexa normal and left adnexa normal.  Musculoskeletal:        General: Normal range of motion.     Cervical back: Normal range of motion.  Lymphadenopathy:     Upper Body:     Right upper body: No axillary adenopathy.     Left upper body: No axillary adenopathy.     Lower Body: No right inguinal adenopathy. No left inguinal adenopathy.  Skin:    General: Skin is warm and dry.  Neurological:     General: No focal deficit present.     Mental Status: She is alert.  Psychiatric:        Mood and Affect: Mood normal.        Behavior: Behavior normal.       Assessment and Plan:        Well woman exam with routine gynecological exam Assessment &  Plan: Cervical cancer screening completed 2012, no longer indicated given age and risk factors. Encouraged annual mammogram screening Colonoscopy per GI recommendations given patient age DXA due 2026 Labs and immunizations with her primary Encouraged safe sexual practices as indicated Encouraged healthy lifestyle practices with diet and exercise For patients over 70yo, I recommend 1200mg  calcium daily and 800IU of vitamin D daily.    Osteopenia, unspecified location Assessment & Plan: 2024 T-Score -1.7.  Continue vitamin D+Calcium Encouraged weight based exercise DXA due 2026    Postmenopausal hormone replacement therapy Assessment & Plan: CV risk calculated for this patient on 10/17/23: ~27%. Decision to taper HRT over 3 months and then discontinue. Patient is doing  well.  Continue taper. Patient in agreement.     Rosalyn Gess, MD

## 2023-11-28 NOTE — Assessment & Plan Note (Signed)
CV risk calculated for this patient on 10/17/23: ~27%. Decision to taper HRT over 3 months and then discontinue. Patient is doing well.  Continue taper. Patient in agreement.

## 2023-11-29 DIAGNOSIS — R2689 Other abnormalities of gait and mobility: Secondary | ICD-10-CM | POA: Diagnosis not present

## 2023-11-29 DIAGNOSIS — R2681 Unsteadiness on feet: Secondary | ICD-10-CM | POA: Diagnosis not present

## 2023-12-01 DIAGNOSIS — R2689 Other abnormalities of gait and mobility: Secondary | ICD-10-CM | POA: Diagnosis not present

## 2023-12-01 DIAGNOSIS — R2681 Unsteadiness on feet: Secondary | ICD-10-CM | POA: Diagnosis not present

## 2023-12-02 DIAGNOSIS — R2681 Unsteadiness on feet: Secondary | ICD-10-CM | POA: Diagnosis not present

## 2023-12-02 DIAGNOSIS — R2689 Other abnormalities of gait and mobility: Secondary | ICD-10-CM | POA: Diagnosis not present

## 2023-12-05 DIAGNOSIS — R2689 Other abnormalities of gait and mobility: Secondary | ICD-10-CM | POA: Diagnosis not present

## 2023-12-05 DIAGNOSIS — H353221 Exudative age-related macular degeneration, left eye, with active choroidal neovascularization: Secondary | ICD-10-CM | POA: Diagnosis not present

## 2023-12-05 DIAGNOSIS — R2681 Unsteadiness on feet: Secondary | ICD-10-CM | POA: Diagnosis not present

## 2023-12-06 DIAGNOSIS — R2681 Unsteadiness on feet: Secondary | ICD-10-CM | POA: Diagnosis not present

## 2023-12-06 DIAGNOSIS — R2689 Other abnormalities of gait and mobility: Secondary | ICD-10-CM | POA: Diagnosis not present

## 2023-12-07 DIAGNOSIS — L821 Other seborrheic keratosis: Secondary | ICD-10-CM | POA: Diagnosis not present

## 2023-12-07 DIAGNOSIS — R2689 Other abnormalities of gait and mobility: Secondary | ICD-10-CM | POA: Diagnosis not present

## 2023-12-07 DIAGNOSIS — L718 Other rosacea: Secondary | ICD-10-CM | POA: Diagnosis not present

## 2023-12-07 DIAGNOSIS — D225 Melanocytic nevi of trunk: Secondary | ICD-10-CM | POA: Diagnosis not present

## 2023-12-07 DIAGNOSIS — Z85828 Personal history of other malignant neoplasm of skin: Secondary | ICD-10-CM | POA: Diagnosis not present

## 2023-12-07 DIAGNOSIS — R2681 Unsteadiness on feet: Secondary | ICD-10-CM | POA: Diagnosis not present

## 2023-12-07 DIAGNOSIS — L570 Actinic keratosis: Secondary | ICD-10-CM | POA: Diagnosis not present

## 2023-12-07 DIAGNOSIS — L218 Other seborrheic dermatitis: Secondary | ICD-10-CM | POA: Diagnosis not present

## 2023-12-12 DIAGNOSIS — R2689 Other abnormalities of gait and mobility: Secondary | ICD-10-CM | POA: Diagnosis not present

## 2023-12-12 DIAGNOSIS — R2681 Unsteadiness on feet: Secondary | ICD-10-CM | POA: Diagnosis not present

## 2023-12-14 DIAGNOSIS — R2689 Other abnormalities of gait and mobility: Secondary | ICD-10-CM | POA: Diagnosis not present

## 2023-12-14 DIAGNOSIS — R2681 Unsteadiness on feet: Secondary | ICD-10-CM | POA: Diagnosis not present

## 2023-12-19 DIAGNOSIS — R2681 Unsteadiness on feet: Secondary | ICD-10-CM | POA: Diagnosis not present

## 2023-12-19 DIAGNOSIS — R2689 Other abnormalities of gait and mobility: Secondary | ICD-10-CM | POA: Diagnosis not present

## 2023-12-20 DIAGNOSIS — R2689 Other abnormalities of gait and mobility: Secondary | ICD-10-CM | POA: Diagnosis not present

## 2023-12-20 DIAGNOSIS — R2681 Unsteadiness on feet: Secondary | ICD-10-CM | POA: Diagnosis not present

## 2023-12-22 DIAGNOSIS — R2681 Unsteadiness on feet: Secondary | ICD-10-CM | POA: Diagnosis not present

## 2023-12-22 DIAGNOSIS — R2689 Other abnormalities of gait and mobility: Secondary | ICD-10-CM | POA: Diagnosis not present

## 2023-12-27 DIAGNOSIS — R2689 Other abnormalities of gait and mobility: Secondary | ICD-10-CM | POA: Diagnosis not present

## 2023-12-27 DIAGNOSIS — R2681 Unsteadiness on feet: Secondary | ICD-10-CM | POA: Diagnosis not present

## 2023-12-28 DIAGNOSIS — L821 Other seborrheic keratosis: Secondary | ICD-10-CM | POA: Diagnosis not present

## 2023-12-28 DIAGNOSIS — Z85828 Personal history of other malignant neoplasm of skin: Secondary | ICD-10-CM | POA: Diagnosis not present

## 2023-12-28 DIAGNOSIS — L570 Actinic keratosis: Secondary | ICD-10-CM | POA: Diagnosis not present

## 2023-12-29 DIAGNOSIS — R2689 Other abnormalities of gait and mobility: Secondary | ICD-10-CM | POA: Diagnosis not present

## 2023-12-29 DIAGNOSIS — R2681 Unsteadiness on feet: Secondary | ICD-10-CM | POA: Diagnosis not present

## 2024-01-02 DIAGNOSIS — H353221 Exudative age-related macular degeneration, left eye, with active choroidal neovascularization: Secondary | ICD-10-CM | POA: Diagnosis not present

## 2024-01-03 DIAGNOSIS — R2689 Other abnormalities of gait and mobility: Secondary | ICD-10-CM | POA: Diagnosis not present

## 2024-01-03 DIAGNOSIS — R2681 Unsteadiness on feet: Secondary | ICD-10-CM | POA: Diagnosis not present

## 2024-01-04 DIAGNOSIS — R2681 Unsteadiness on feet: Secondary | ICD-10-CM | POA: Diagnosis not present

## 2024-01-04 DIAGNOSIS — R2689 Other abnormalities of gait and mobility: Secondary | ICD-10-CM | POA: Diagnosis not present

## 2024-01-05 DIAGNOSIS — R2681 Unsteadiness on feet: Secondary | ICD-10-CM | POA: Diagnosis not present

## 2024-01-05 DIAGNOSIS — R2689 Other abnormalities of gait and mobility: Secondary | ICD-10-CM | POA: Diagnosis not present

## 2024-01-10 DIAGNOSIS — R2689 Other abnormalities of gait and mobility: Secondary | ICD-10-CM | POA: Diagnosis not present

## 2024-01-10 DIAGNOSIS — R2681 Unsteadiness on feet: Secondary | ICD-10-CM | POA: Diagnosis not present

## 2024-01-12 DIAGNOSIS — R2681 Unsteadiness on feet: Secondary | ICD-10-CM | POA: Diagnosis not present

## 2024-01-12 DIAGNOSIS — R2689 Other abnormalities of gait and mobility: Secondary | ICD-10-CM | POA: Diagnosis not present

## 2024-01-16 DIAGNOSIS — R2689 Other abnormalities of gait and mobility: Secondary | ICD-10-CM | POA: Diagnosis not present

## 2024-01-16 DIAGNOSIS — R2681 Unsteadiness on feet: Secondary | ICD-10-CM | POA: Diagnosis not present

## 2024-01-17 DIAGNOSIS — R2689 Other abnormalities of gait and mobility: Secondary | ICD-10-CM | POA: Diagnosis not present

## 2024-01-17 DIAGNOSIS — R2681 Unsteadiness on feet: Secondary | ICD-10-CM | POA: Diagnosis not present

## 2024-01-23 DIAGNOSIS — R2689 Other abnormalities of gait and mobility: Secondary | ICD-10-CM | POA: Diagnosis not present

## 2024-01-23 DIAGNOSIS — R2681 Unsteadiness on feet: Secondary | ICD-10-CM | POA: Diagnosis not present

## 2024-01-24 DIAGNOSIS — R2689 Other abnormalities of gait and mobility: Secondary | ICD-10-CM | POA: Diagnosis not present

## 2024-01-24 DIAGNOSIS — R2681 Unsteadiness on feet: Secondary | ICD-10-CM | POA: Diagnosis not present

## 2024-01-25 DIAGNOSIS — R2681 Unsteadiness on feet: Secondary | ICD-10-CM | POA: Diagnosis not present

## 2024-01-25 DIAGNOSIS — R2689 Other abnormalities of gait and mobility: Secondary | ICD-10-CM | POA: Diagnosis not present

## 2024-01-26 DIAGNOSIS — R2681 Unsteadiness on feet: Secondary | ICD-10-CM | POA: Diagnosis not present

## 2024-01-26 DIAGNOSIS — R2689 Other abnormalities of gait and mobility: Secondary | ICD-10-CM | POA: Diagnosis not present

## 2024-01-30 DIAGNOSIS — R2689 Other abnormalities of gait and mobility: Secondary | ICD-10-CM | POA: Diagnosis not present

## 2024-01-30 DIAGNOSIS — R2681 Unsteadiness on feet: Secondary | ICD-10-CM | POA: Diagnosis not present

## 2024-01-31 DIAGNOSIS — H353221 Exudative age-related macular degeneration, left eye, with active choroidal neovascularization: Secondary | ICD-10-CM | POA: Diagnosis not present

## 2024-01-31 DIAGNOSIS — H353112 Nonexudative age-related macular degeneration, right eye, intermediate dry stage: Secondary | ICD-10-CM | POA: Diagnosis not present

## 2024-01-31 DIAGNOSIS — Z961 Presence of intraocular lens: Secondary | ICD-10-CM | POA: Diagnosis not present

## 2024-01-31 DIAGNOSIS — H35033 Hypertensive retinopathy, bilateral: Secondary | ICD-10-CM | POA: Diagnosis not present

## 2024-01-31 DIAGNOSIS — H43813 Vitreous degeneration, bilateral: Secondary | ICD-10-CM | POA: Diagnosis not present

## 2024-02-02 DIAGNOSIS — R2689 Other abnormalities of gait and mobility: Secondary | ICD-10-CM | POA: Diagnosis not present

## 2024-02-02 DIAGNOSIS — R2681 Unsteadiness on feet: Secondary | ICD-10-CM | POA: Diagnosis not present

## 2024-02-02 DIAGNOSIS — B351 Tinea unguium: Secondary | ICD-10-CM | POA: Diagnosis not present

## 2024-02-02 DIAGNOSIS — M2011 Hallux valgus (acquired), right foot: Secondary | ICD-10-CM | POA: Diagnosis not present

## 2024-02-02 DIAGNOSIS — N189 Chronic kidney disease, unspecified: Secondary | ICD-10-CM | POA: Diagnosis not present

## 2024-02-02 DIAGNOSIS — M79675 Pain in left toe(s): Secondary | ICD-10-CM | POA: Diagnosis not present

## 2024-02-07 DIAGNOSIS — R2689 Other abnormalities of gait and mobility: Secondary | ICD-10-CM | POA: Diagnosis not present

## 2024-02-07 DIAGNOSIS — R2681 Unsteadiness on feet: Secondary | ICD-10-CM | POA: Diagnosis not present

## 2024-02-08 ENCOUNTER — Ambulatory Visit (INDEPENDENT_AMBULATORY_CARE_PROVIDER_SITE_OTHER): Payer: Medicare Other | Admitting: Family Medicine

## 2024-02-08 ENCOUNTER — Encounter: Payer: Self-pay | Admitting: Family Medicine

## 2024-02-08 VITALS — BP 142/62 | HR 64 | Temp 98.2°F | Ht 62.5 in | Wt 109.8 lb

## 2024-02-08 DIAGNOSIS — I10 Essential (primary) hypertension: Secondary | ICD-10-CM

## 2024-02-08 DIAGNOSIS — I480 Paroxysmal atrial fibrillation: Secondary | ICD-10-CM

## 2024-02-08 DIAGNOSIS — R350 Frequency of micturition: Secondary | ICD-10-CM | POA: Diagnosis not present

## 2024-02-08 DIAGNOSIS — N183 Chronic kidney disease, stage 3 unspecified: Secondary | ICD-10-CM

## 2024-02-08 LAB — COMPREHENSIVE METABOLIC PANEL
ALT: 35 U/L (ref 0–35)
AST: 31 U/L (ref 0–37)
Albumin: 4 g/dL (ref 3.5–5.2)
Alkaline Phosphatase: 80 U/L (ref 39–117)
BUN: 16 mg/dL (ref 6–23)
CO2: 28 meq/L (ref 19–32)
Calcium: 9.4 mg/dL (ref 8.4–10.5)
Chloride: 98 meq/L (ref 96–112)
Creatinine, Ser: 0.85 mg/dL (ref 0.40–1.20)
GFR: 61.14 mL/min (ref >60.00)
Glucose, Bld: 103 mg/dL — ABNORMAL HIGH (ref 70–99)
Potassium: 4 meq/L (ref 3.5–5.1)
Sodium: 134 meq/L — ABNORMAL LOW (ref 135–145)
Total Bilirubin: 0.5 mg/dL (ref 0.2–1.2)
Total Protein: 7 g/dL (ref 6.0–8.3)

## 2024-02-08 LAB — CBC WITH DIFFERENTIAL/PLATELET
Basophils Absolute: 0.1 10*3/uL (ref 0.0–0.1)
Basophils Relative: 0.6 % (ref 0.0–3.0)
Eosinophils Absolute: 0.2 10*3/uL (ref 0.0–0.7)
Eosinophils Relative: 2.4 % (ref 0.0–5.0)
HCT: 34.9 % — ABNORMAL LOW (ref 36.0–46.0)
Hemoglobin: 11.7 g/dL — ABNORMAL LOW (ref 12.0–15.0)
Lymphocytes Relative: 18.5 % (ref 12.0–46.0)
Lymphs Abs: 1.8 10*3/uL (ref 0.7–4.0)
MCHC: 33.6 g/dL (ref 30.0–36.0)
MCV: 92.1 fl (ref 78.0–100.0)
Monocytes Absolute: 1.2 10*3/uL — ABNORMAL HIGH (ref 0.1–1.0)
Monocytes Relative: 12.2 % — ABNORMAL HIGH (ref 3.0–12.0)
Neutro Abs: 6.3 10*3/uL (ref 1.4–7.7)
Neutrophils Relative %: 66.3 % (ref 43.0–77.0)
Platelets: 322 10*3/uL (ref 150.0–400.0)
RBC: 3.79 Mil/uL — ABNORMAL LOW (ref 3.87–5.11)
RDW: 13.5 % (ref 11.5–15.5)
WBC: 9.5 10*3/uL (ref 4.0–10.5)

## 2024-02-08 LAB — TSH: TSH: 3.22 u[IU]/mL (ref 0.35–5.50)

## 2024-02-08 NOTE — Progress Notes (Addendum)
 Phone [phone] In person visit   Subjective:   Bailey [name] is a 89 y.o. year old very pleasant female patient who presents for/with See problem oriented charting Chief Complaint  Patient presents with   Medical Management of Chronic Issues   Hypertension   Cough    Pt c/o lingering cough with yellow mucous x3 weeks but it is getting better.    Past Medical History-  Patient Active Problem List   Diagnosis Date Noted   Sjogren's syndrome with keratoconjunctivitis sicca (HCC) 05/12/2017    Priority: High   Paroxysmal atrial fibrillation (HCC) 09/05/2008    Priority: High   CKD (chronic kidney disease), stage III (HCC) 12/08/2017    Priority: Medium    Moderate aortic regurgitation 12/08/2017    Priority: Medium    HTN (hypertension) 03/13/2015    Priority: Medium    Raynauds syndrome 10/21/2014    Priority: Medium    Primary osteoarthritis of both hands 08/21/2009    Priority: Medium    PVC (premature ventricular contraction) 06/04/2009    Priority: Medium    Mitral valve prolapse 12/25/2008    Priority: Medium    Irritable bowel syndrome 09/04/2008    Priority: Medium    Premature ventricular contractions     Priority: Low   Hepatitis     Priority: Low   History of skin cancer     Priority: Low   Primary osteoarthritis of both feet 10/26/2016    Priority: Low   Anemia 03/13/2015    Priority: Low   Recurrent cold sores 10/21/2014    Priority: Low   Edema 06/29/2011    Priority: Low   Osteopenia 09/05/2008    Priority: Low   GERD 09/04/2008    Priority: Low   ALLERGY 09/04/2008    Priority: Low   COLONIC POLYPS 09/04/2008    Priority: Low   Well woman exam with routine gynecological exam 11/28/2023   Postmenopausal hormone replacement therapy 10/17/2023   Dry eyes, bilateral 05/15/2018   Lumbar radiculopathy 12/12/2017   Allergy    Balance disorder 10/26/2016    Medications- reviewed and updated Current Outpatient Medications  Medication Sig  Dispense Refill   aspirin 81 MG tablet Take 81 mg by mouth daily.     carvedilol (COREG) 3.125 MG tablet TAKE 2 TABLETS BY MOUTH EVERY MORNING AND TAKE 1 TABLET EVERY EVENING 270 tablet 3   Cholecalciferol (VITAMIN D) 400 UNITS capsule Take 2 capsules by mouth daily     cycloSPORINE (RESTASIS) 0.05 % ophthalmic emulsion Place 1 drop into both eyes 2 (two) times daily.     escitalopram (LEXAPRO) 10 MG tablet TAKE 1 TABLET BY MOUTH EVERY DAY 90 tablet 3   estrogen, conjugated,-medroxyprogesterone (PREMPRO) 0.45-1.5 MG tablet Take 1/2 tab po twice a week. 4 tablet 2   fish oil-omega-3 fatty acids 1000 MG capsule Take one capsule by mouth  daily     losartan (COZAAR) 100 MG tablet TAKE 1 TABLET BY MOUTH EVERY DAY 90 tablet 3   Multiple Vitamins-Minerals (PRESERVISION AREDS PO) Take by mouth.     No current facility-administered medications for this visit.     Objective:  BP (!) 142/62   Pulse 64   Temp 98.2 F (36.8 C)   Ht 5' 2.5" (1.588 m)   Wt 109 lb 12.8 oz (49.8 kg)   LMP  (LMP Unknown)   SpO2 98%   BMI 19.76 kg/m  Gen: NAD, resting comfortably Mild maxillary sinus tenderness.  Tympanic membranes  normal bilaterally.  Nasal turbinates erythematous with yellow discharge noted CV: RRR  Lungs: CTAB no crackles, wheeze, rhonchi Ext: no edema Skin: warm, dry     Assessment and Plan   # Cough and sinus irritation-3 weeks of cough but seems to be improving at this point.  Started out with slight sore throat and then got cough- blew out clear discharge from nose. Never had fever.  Negative at home test for COVID or flu. Nasal irritation in right nostril - sickle cell anemia at times -lungs clear today- continue to monitor -suspect having viral upper respiratory infection or viral sinusitis-may be lingering with allergies and she may trial Claritin or Allegra - Consider Flonase but may worsen nasal irritation/prior scab issue  #prior hip pain- but improving- had some in years past- was  told sciatica in past- working with physical therapy  -Physical therapy  also working with grip strength  #Frequent urination- no burning. No new incontinence but does have some urgency.   #afternoon fatigue- seems to be worse coming off of pempro. Thankfully sleeps well  #hypertension/CKD stage III S: compliant with losartan 100 mg, carvedilol 6.5 mg in the morning and 3.25 mg in the evening  Creatinine has been stable around 0.9-1 but she is rather thin-gfr in high 40's or 50's mostly. Avoid nsaids.  -exercise 3 days a week BP Readings from Last 3 Encounters:  02/08/24 (!) 142/62  11/28/23 122/62  11/09/23 (!) 143/79  A/P: hypertension mildly high but prefers to reduce medication burden- See if you can cut out the ham or other high salt processed foods- see how blood pressure looks at home and update me in 2 weeks with ome readings- she is hesitant to do so- feels anxious about checking. Also just came off of hormone replacement therapy - so may take some time to normalize.   Chronic kidney disease III - hopefully stable- update cmp today. Continue without meds for now    #Paroxysmal atrial fibrillation/palpitations-follows with Dr. Eden Emms S: Patient previously with episodes of atrial fibrillation related to epinephrine use during dental procedures.  Has not had a known episode since 2014.  She has been continued on aspirin alone since she has been in sinus rhythm for some time.  She is on carvedilol for blood pressure which would offer some rate control if needed but is primarily used for blood pressure A/P: appears to be in sinus- continue to monitor. Not on anticoagulation as has been out of a fib. Coreg would help heart rate if went back into a fib    #Macular degeneration- new diagnosis in 2024 and more recently requiring monthly injections  #anxiety- on Lexapro 10 mg and helpful in general but seems to be having more anxiety as she came off of Prempro.  Hesitant to increase the dose  since has possibility of raising blood pressure on 1 hand on the other hand may help lessen anxiety which may lower blood pressure-for now we will hold steady and see how she does further out from being off of the medication from prior  Recommended follow up: Return in about 3 months (around 05/10/2024) for followup or sooner if needed.Schedule b4 you leave. Future Appointments  Date Time Provider Department Center  05/16/2024  1:00 PM Pollyann Savoy, MD [facility] None  11/26/2024  3:00 PM [facility] ANNUAL WELLNESS VISIT 1 [facility]  11/28/2025 10:00 AM Hines, Lennox Solders, MD [facility] None    Lab/Order associations:   ICD-10-CM   1. Paroxysmal atrial fibrillation (HCC)  I48.0     2. Hypertension, unspecified type  I10 Comprehensive metabolic panel    CBC with Differential/Platelet    TSH    3. Stage 3 chronic kidney disease, unspecified whether stage 3a or 3b CKD (HCC)  N18.30 Comprehensive metabolic panel    CBC with Differential/Platelet    4. Urinary frequency  R35.0 Urine Culture      Time Spent: 42 minutes of total time (10: 24 AM- 11:06 AM) was spent on the date of the encounter performing the following actions: chart review prior to seeing the patient, obtaining history, performing a medically necessary exam, counseling on the treatment plan as well as patient concerns about anxiety, recent illness, blood pressure treatment and contributing factors, placing orders, and documenting in our EHR.    Return precautions advised.  Tana Conch, MD

## 2024-02-08 NOTE — Patient Instructions (Addendum)
 Please stop by lab before you go If you have mychart- we will send your results within 3 business days of [place] receiving them.  If you do not have mychart- we will call you about results within 5 business days of [place] receiving them.  *please also note that you will see labs on mychart as soon as they post. I will later go in and write notes on them- will say "notes from Dr. Durene Cal"   See if you can cut out the ham or other high salt processed foods- see how blood pressure looks at home and update me in 2 weeks with ome readings if you are able without increasing anxiety -or use blood pressure clinic at [place]   Could try allegra or claritin for sinus irritation- if worsens let me know as could need course of antibiotic  Recommended follow up: Return in about 3 months (around 05/10/2024) for followup or sooner if needed.Schedule b4 you leave.

## 2024-02-08 NOTE — Addendum Note (Signed)
 Addended by: Shelva Majestic on: 02/08/2024 03:42 PM   Modules accepted: Level of Service

## 2024-02-09 DIAGNOSIS — R2689 Other abnormalities of gait and mobility: Secondary | ICD-10-CM | POA: Diagnosis not present

## 2024-02-09 DIAGNOSIS — R2681 Unsteadiness on feet: Secondary | ICD-10-CM | POA: Diagnosis not present

## 2024-02-10 ENCOUNTER — Encounter: Payer: Self-pay | Admitting: Family Medicine

## 2024-02-10 ENCOUNTER — Other Ambulatory Visit: Payer: Self-pay | Admitting: Family Medicine

## 2024-02-10 LAB — URINE CULTURE
MICRO NUMBER:: 16192166
SPECIMEN QUALITY:: ADEQUATE

## 2024-02-10 MED ORDER — NITROFURANTOIN MONOHYD MACRO 100 MG PO CAPS: 100.0000 mg | ORAL_CAPSULE | Freq: Two times a day (BID) | ORAL | 0 refills | Status: AC

## 2024-02-13 ENCOUNTER — Telehealth: Payer: Self-pay | Admitting: *Deleted

## 2024-02-13 ENCOUNTER — Ambulatory Visit: Payer: Self-pay | Admitting: Family Medicine

## 2024-02-13 NOTE — Telephone Encounter (Signed)
 Patient left message on triage line asking when next BMD is due and MMG.   Scheduled for Breast & pelvic on 11/28/25 with GH.   Last MMG at [facility] 09/14/23 BMD 12/08/2022 -Due 2026  Left message to call [facility] Triage at [phone], option 4.   BMD order for [facility] printed and to Dr. Kennith Center for signature to be faxed.

## 2024-02-13 NOTE — Telephone Encounter (Signed)
 Copied from CRM [id number]. Topic: Clinical - Lab/Test Results >> Feb 13, 2024  1:59 PM Isabell A wrote: Reason for CRM: Patient would like to speak with a nurse to discuss lab results.   Chief Complaint: PCP Call Only-No Triage  Disposition: [] ED /[] Urgent Care (no appt availability in office) / [] Appointment(In office/virtual)/ []  [facility]/ [] Home Care/ [] Refused Recommended Disposition /[] [facility]/ [x]  Follow-up with PCP Additional Notes: AK called in regarding her recently obtained Lab Results and questions regarding her treatment. Provided education on both. Patient verbalized understanding.   Reason for Disposition  Caller requesting routine or non-urgent lab result  Answer Assessment - Initial Assessment Questions 1. REASON FOR CALL or QUESTION: "What is your reason for calling today?" or "How can I best help you?" or "What question do you have that I can help answer?"     Regarding Lab Results  2. CALLER: Document the source of call. (e.g., laboratory, patient).     Patient  Protocols used: PCP Call - No Triage-A-AH

## 2024-02-14 DIAGNOSIS — R2681 Unsteadiness on feet: Secondary | ICD-10-CM | POA: Diagnosis not present

## 2024-02-14 DIAGNOSIS — R2689 Other abnormalities of gait and mobility: Secondary | ICD-10-CM | POA: Diagnosis not present

## 2024-02-15 NOTE — Telephone Encounter (Signed)
 Patient notified. Patient will call [facility] to schedule BMD and screening MMG.   Patient verbalizes understanding and is agreeable.   Encounter closed.

## 2024-02-15 NOTE — Telephone Encounter (Signed)
 Dr. Kennith Center -did you receive the BMD order to sign?

## 2024-02-16 DIAGNOSIS — R2681 Unsteadiness on feet: Secondary | ICD-10-CM | POA: Diagnosis not present

## 2024-02-16 DIAGNOSIS — R2689 Other abnormalities of gait and mobility: Secondary | ICD-10-CM | POA: Diagnosis not present

## 2024-02-21 DIAGNOSIS — R2689 Other abnormalities of gait and mobility: Secondary | ICD-10-CM | POA: Diagnosis not present

## 2024-02-21 DIAGNOSIS — R2681 Unsteadiness on feet: Secondary | ICD-10-CM | POA: Diagnosis not present

## 2024-02-22 ENCOUNTER — Telehealth: Payer: Self-pay

## 2024-02-22 NOTE — Telephone Encounter (Signed)
 Patient called and stated that she had a BMD 09/14/2023. She wants to know if she still needs to get one or wait until it has been two years. Please advise.   Pateint call back number # [phone]

## 2024-02-23 DIAGNOSIS — R2689 Other abnormalities of gait and mobility: Secondary | ICD-10-CM | POA: Diagnosis not present

## 2024-02-23 DIAGNOSIS — R2681 Unsteadiness on feet: Secondary | ICD-10-CM | POA: Diagnosis not present

## 2024-02-23 NOTE — Telephone Encounter (Signed)
Pateint advised.

## 2024-02-28 DIAGNOSIS — R2689 Other abnormalities of gait and mobility: Secondary | ICD-10-CM | POA: Diagnosis not present

## 2024-02-28 DIAGNOSIS — R2681 Unsteadiness on feet: Secondary | ICD-10-CM | POA: Diagnosis not present

## 2024-03-01 DIAGNOSIS — R2681 Unsteadiness on feet: Secondary | ICD-10-CM | POA: Diagnosis not present

## 2024-03-01 DIAGNOSIS — R2689 Other abnormalities of gait and mobility: Secondary | ICD-10-CM | POA: Diagnosis not present

## 2024-03-06 DIAGNOSIS — H353221 Exudative age-related macular degeneration, left eye, with active choroidal neovascularization: Secondary | ICD-10-CM | POA: Diagnosis not present

## 2024-04-09 DIAGNOSIS — H353221 Exudative age-related macular degeneration, left eye, with active choroidal neovascularization: Secondary | ICD-10-CM | POA: Diagnosis not present

## 2024-04-17 NOTE — Telephone Encounter (Signed)
 Patient left message requesting BMD to [facility] to be scheduled 11/2024.   Order printed and to Dr. Andrena Ke to be signed, then faxed.

## 2024-05-02 NOTE — Progress Notes (Signed)
 Office Visit Note  Patient: Bailey [name]             Date of Birth: September 09, 1933           MRN: [id number]             PCP: Almira Jaeger, MD Referring: Almira Jaeger, MD Visit Date: 05/16/2024 Occupation: @GUAROCC @  Subjective:  Pain in hands  History of Present Illness: Bailey [name] is a 89 y.o. female with sicca syndrome, osteoarthritis, Raynauds and osteopenia.  She continues to have dry mouth and dry eyes.  She states she was recently diagnosed with macular degeneration in her both eyes.  She has had injections in her eyes.  She states her Raynaud's symptoms are mild but her fingers turn red and sometimes tingle.  She continues to have some stiffness in her hands and feet due to underlying osteoarthritis.  She continues to have discomfort in her right thumb base.  She states dry mouth and dry eye symptoms persist.  She has been using over-the-counter products.    Activities of Daily Living:  Patient reports morning stiffness for 30 minutes.   Patient Denies nocturnal pain.  Difficulty dressing/grooming: Denies Difficulty climbing stairs: Reports Difficulty getting out of chair: Reports Difficulty using hands for taps, buttons, cutlery, and/or writing: Denies  Review of Systems  Constitutional:  Positive for fatigue.  HENT:  Positive for mouth sores and mouth dryness.   Eyes:  Positive for dryness.  Respiratory:  Negative for shortness of breath.   Cardiovascular:  Negative for chest pain and palpitations.  Gastrointestinal:  Negative for blood in stool, constipation and diarrhea.  Endocrine: Negative for increased urination.  Genitourinary:  Negative for involuntary urination.  Musculoskeletal:  Positive for joint pain, gait problem, joint pain, myalgias, morning stiffness and myalgias. Negative for joint swelling, muscle weakness and muscle tenderness.  Skin:  Positive for sensitivity to sunlight. Negative for color change, rash and hair loss.  Allergic/Immunologic:  Negative for susceptible to infections.  Neurological:  Positive for dizziness. Negative for headaches.  Hematological:  Negative for swollen glands.  Psychiatric/Behavioral:  Positive for depressed mood. Negative for sleep disturbance. The patient is nervous/anxious.     PMFS History:  Patient Active Problem List   Diagnosis Date Noted   Well woman exam with routine gynecological exam 11/28/2023   Postmenopausal hormone replacement therapy 10/17/2023   Dry eyes, bilateral 05/15/2018   Lumbar radiculopathy 12/12/2017   Allergy    CKD (chronic kidney disease), stage III (HCC) 12/08/2017   Moderate aortic regurgitation 12/08/2017   Premature ventricular contractions    Hepatitis    History of skin cancer    Sjogren's syndrome with keratoconjunctivitis sicca (HCC) 05/12/2017   Balance disorder 10/26/2016   Primary osteoarthritis of both feet 10/26/2016   HTN (hypertension) 03/13/2015   Anemia 03/13/2015   Raynauds syndrome 10/21/2014   Recurrent cold sores 10/21/2014   Edema 06/29/2011   Primary osteoarthritis of both hands 08/21/2009   PVC (premature ventricular contraction) 06/04/2009   Mitral valve prolapse 12/25/2008   Paroxysmal atrial fibrillation (HCC) 09/05/2008   Osteopenia 09/05/2008   GERD 09/04/2008   Irritable bowel syndrome 09/04/2008   ALLERGY 09/04/2008   COLONIC POLYPS 09/04/2008    Past Medical History:  Diagnosis Date   Allergy, unspecified not elsewhere classified    Anemia, unspecified    Benign neoplasm of colon    Cancer (HCC)    basal/sqamous/transitional melanoma   CKD (chronic kidney disease), stage III (  HCC) 12/08/2017   COLONIC POLYPS 09/04/2008   Was told by Gi does not need anymore.     Diverticulosis of colon (without mention of hemorrhage)    Dry eye syndrome    Dysrhythmia    PVCs   Esophageal reflux    Hepatitis 1950s   infectious from food   History of atrial fibrillation    Hypertension    Irritable bowel syndrome    Macular  degeneration    Mitral valve disorders(424.0)    Moderate aortic regurgitation 12/08/2017   Osteopenia 06/2018   T score -1.8 FRAX 10% / 2.5%   Palpitations    Premature ventricular contractions    Rheumatoid arthritis(714.0)    Sicca syndrome (HCC)    Ulcer 1992   Vaginal delivery 1959, 1960, 1962, 1964    Family History  Problem Relation Age of Onset   Stroke Mother    Hypertension Mother    Ulcerative colitis Father    Heart attack Father    Breast cancer Maternal Grandmother        Age 34's   Uterine cancer Maternal Grandmother    Hypertension Maternal Grandfather    Colon cancer Paternal Grandmother    Past Surgical History:  Procedure Laterality Date   APPENDECTOMY     CATARACT EXTRACTION     CHOLECYSTECTOMY     colon tumor  1969   Benign rectal tumor excised   DILATATION & CURETTAGE/HYSTEROSCOPY WITH MYOSURE N/A 01/21/2015   Procedure: DILATATION & CURETTAGE/HYSTEROSCOPY WITH MYOSURE;  Surgeon: Lacretia Piccolo, MD;  Location: [facility];  Service: Gynecology;  Laterality: N/A;   eye injection Left    HIATAL HERNIA REPAIR     REFRACTIVE SURGERY     ROTATOR CUFF REPAIR  2009   right by Dr. Marland Silvas   SKIN BIOPSY  11/2020   squamous cell removed  2013   Dr. Artice Last --removed from her right cheek   TUBAL LIGATION     Social History   Social History Narrative   Married 1958. 4 children. 10 grandkids (1 trying to go to medical school). No greatgrandkids.       Retired from nursing (medical and surgical, most recently in cardiology)   Degree in history and english after finished nursing- 2 mo before 72th birthday      Hobbies: education, travel, reading, piano    Immunization History  Administered Date(s) Administered   Fluad Quad(high Dose 65+) 08/21/2019   H1N1 11/26/2008   Influenza Split 08/30/2011, 10/31/2012   Influenza Whole 09/05/2008, 08/27/2009, 08/20/2010   Influenza, High Dose Seasonal PF 08/31/2016, 09/05/2017, 08/30/2018, 08/21/2022, 08/25/2023    Influenza,inj,Quad PF,6+ Mos 09/13/2013, 10/04/2014, 10/01/2015   Influenza-Unspecified 08/26/2020, 09/16/2021   Moderna Covid-19 Fall Seasonal Vaccine 29yrs & older 09/27/2022   Moderna Covid-19 Vaccine  Bivalent Booster 64yrs & up 12/03/2021   Moderna SARS-COV2 Booster Vaccination 03/30/2021   Moderna Sars-Covid-2 Vaccination 12/20/2019, 01/17/2020, 10/02/2020   Pfizer(Comirnaty)Fall Seasonal Vaccine 12 years and older 09/07/2023   Pneumococcal Conjugate-13 10/21/2014   Pneumococcal Polysaccharide-23 10/11/2015   Td 02/17/2010   Tdap 03/02/2021   Zoster, Live 11/29/2008     Objective: Vital Signs: BP 139/64 (BP Location: Left Arm, Patient Position: Sitting, Cuff Size: Normal)   Pulse 64   Resp 14   Ht 5' 2 (1.575 m)   Wt 111 lb (50.3 kg)   LMP  (LMP Unknown)   BMI 20.30 kg/m    Physical Exam Vitals and nursing note reviewed.  Constitutional:  Appearance: She is well-developed.  HENT:     Head: Normocephalic and atraumatic.   Eyes:     Conjunctiva/sclera: Conjunctivae normal.    Cardiovascular:     Rate and Rhythm: Normal rate and regular rhythm.     Heart sounds: Normal heart sounds.  Pulmonary:     Effort: Pulmonary effort is normal.     Breath sounds: Normal breath sounds.  Abdominal:     General: Bowel sounds are normal.     Palpations: Abdomen is soft.   Musculoskeletal:     Cervical back: Normal range of motion.  Lymphadenopathy:     Cervical: No cervical adenopathy.   Skin:    General: Skin is warm and dry.     Capillary Refill: Capillary refill takes less than 2 seconds.   Neurological:     Mental Status: She is alert and oriented to person, place, and time.   Psychiatric:        Behavior: Behavior normal.      Musculoskeletal Exam: Patient had a limited lateral rotation of the cervical spine.  Thoracic kyphosis was noted.  She had no tenderness over thoracic or lumbar spine.  Shoulders and elbows were in good range of motion.  There was  no synovitis of the wrist or MCPs.  Bilateral CMC thickening, PIP and DIP thickening was noted.  Hip joints and knee joints in good range of motion.  No warmth swelling or effusion was noted.  There was no tenderness over ankles or MTPs.  CDAI Exam: CDAI Score: -- Patient Global: --; Provider Global: -- Swollen: --; Tender: -- Joint Exam 05/16/2024   No joint exam has been documented for this visit   There is currently no information documented on the homunculus. Go to the Rheumatology activity and complete the homunculus joint exam.  Investigation: No additional findings.  Imaging: No results found.  Recent Labs: Lab Results  Component Value Date   WBC 9.5 02/08/2024   HGB 11.7 (L) 02/08/2024   PLT 322.0 02/08/2024   NA 134 (L) 02/08/2024   K 4.0 02/08/2024   CL 98 02/08/2024   CO2 28 02/08/2024   GLUCOSE 103 (H) 02/08/2024   BUN 16 02/08/2024   CREATININE 0.85 02/08/2024   BILITOT 0.5 02/08/2024   ALKPHOS 80 02/08/2024   AST 31 02/08/2024   ALT 35 02/08/2024   PROT 7.0 02/08/2024   ALBUMIN 4.0 02/08/2024   CALCIUM 9.4 02/08/2024   GFRAA 61 11/14/2018    Speciality Comments: No specialty comments available.  Procedures:  No procedures performed Allergies: Diamox [acetazolamide], Epinephrine-lidocaine -na metabisulfite [lidocaine -epinephrine (pf)], Erythromycin, Penicillins, Sulfonamide derivatives, and Tape   Assessment / Plan:     Visit Diagnoses: Sicca syndrome (HCC) -ANA negative,SSA negative, RF negative: She continues to have dry mouth and dry eye symptoms.  Over-the-counter products were discussed.  Autoimmune workup has been negative.  Raynaud's disease without gangrene-she has mild Raynaud's symptoms mostly during the winter months  Primary osteoarthritis of both hands.-Bilateral CMC PIP and DIP thickening was noted.  Joint texture muscle stretching was discussed.  A handout on hand exercises was given.  Primary osteoarthritis of both feet-currently not  symptomatic.  Osteopenia of multiple sites - DEXA updated 12/08/22 BMD 0.656 with T-score -1.7.  Results were discussed with the patient.  Previous DEXA 08/05/2020 T-score: -1.6, BMD: 0.668 left femoral neck.  Patient will follow-up with her PCP.  Other fatigue-better.  History of atrial fibrillation  History of hypertension  History of anemia  Balance disorder -  she ambulates with the help of a cane.  She noted some improvement after physical therapy.  History of gastroesophageal reflux (GERD)  History of IBS  History of macular degeneration-recent diagnosis.  She has been getting injections in her eyes.  She has been followed by ophthalmologist.  Orders: No orders of the defined types were placed in this encounter.  No orders of the defined types were placed in this encounter.    Follow-Up Instructions: Return in about 6 months (around 11/15/2024) for Osteoarthritis.   Nicholas Bari, MD  Note - This record has been created using Animal nutritionist.  Chart creation errors have been sought, but may not always  have been located. Such creation errors do not reflect on  the standard of medical care.

## 2024-05-03 DIAGNOSIS — M2011 Hallux valgus (acquired), right foot: Secondary | ICD-10-CM | POA: Diagnosis not present

## 2024-05-03 DIAGNOSIS — B351 Tinea unguium: Secondary | ICD-10-CM | POA: Diagnosis not present

## 2024-05-03 DIAGNOSIS — M79675 Pain in left toe(s): Secondary | ICD-10-CM | POA: Diagnosis not present

## 2024-05-03 DIAGNOSIS — N189 Chronic kidney disease, unspecified: Secondary | ICD-10-CM | POA: Diagnosis not present

## 2024-05-07 DIAGNOSIS — H353221 Exudative age-related macular degeneration, left eye, with active choroidal neovascularization: Secondary | ICD-10-CM | POA: Diagnosis not present

## 2024-05-07 DIAGNOSIS — H43813 Vitreous degeneration, bilateral: Secondary | ICD-10-CM | POA: Diagnosis not present

## 2024-05-07 DIAGNOSIS — H353112 Nonexudative age-related macular degeneration, right eye, intermediate dry stage: Secondary | ICD-10-CM | POA: Diagnosis not present

## 2024-05-07 DIAGNOSIS — Z961 Presence of intraocular lens: Secondary | ICD-10-CM | POA: Diagnosis not present

## 2024-05-07 DIAGNOSIS — H35033 Hypertensive retinopathy, bilateral: Secondary | ICD-10-CM | POA: Diagnosis not present

## 2024-05-14 ENCOUNTER — Encounter: Payer: Self-pay | Admitting: Family Medicine

## 2024-05-14 ENCOUNTER — Ambulatory Visit (INDEPENDENT_AMBULATORY_CARE_PROVIDER_SITE_OTHER): Admitting: Family Medicine

## 2024-05-14 VITALS — BP 122/64 | HR 60 | Temp 97.4°F | Ht 62.5 in | Wt 109.4 lb

## 2024-05-14 DIAGNOSIS — I480 Paroxysmal atrial fibrillation: Secondary | ICD-10-CM

## 2024-05-14 DIAGNOSIS — I10 Essential (primary) hypertension: Secondary | ICD-10-CM | POA: Diagnosis not present

## 2024-05-14 DIAGNOSIS — N183 Chronic kidney disease, stage 3 unspecified: Secondary | ICD-10-CM

## 2024-05-14 MED ORDER — LOSARTAN POTASSIUM 100 MG PO TABS: 100.0000 mg | ORAL_TABLET | Freq: Every day | ORAL | 3 refills | Status: AC

## 2024-05-14 MED ORDER — ESCITALOPRAM OXALATE 10 MG PO TABS: 10.0000 mg | ORAL_TABLET | Freq: Every day | ORAL | 3 refills | Status: AC

## 2024-05-14 NOTE — Addendum Note (Signed)
 Addended by: Almira Jaeger on: 05/14/2024 11:41 AM   Modules accepted: Orders

## 2024-05-14 NOTE — Patient Instructions (Addendum)
 Call cardiology for follow up with Dr. Legrand Puma next visit  Glad you are doing so well overall!   Recommended follow up: Return in about 6 months (around 11/13/2024) for followup or sooner if needed.Schedule b4 you leave.

## 2024-05-14 NOTE — Progress Notes (Addendum)
 Phone [phone] In person visit   Subjective:   Bailey [name] is a 89 y.o. year old very pleasant female patient who presents for/with See problem oriented charting Chief Complaint  Patient presents with   Medical Management of Chronic Issues   Hypertension    Past Medical History-  Patient Active Problem List   Diagnosis Date Noted   Sjogren's syndrome with keratoconjunctivitis sicca (HCC) 05/12/2017    Priority: High   Paroxysmal atrial fibrillation (HCC) 09/05/2008    Priority: High   CKD (chronic kidney disease), stage III (HCC) 12/08/2017    Priority: Medium    Moderate aortic regurgitation 12/08/2017    Priority: Medium    HTN (hypertension) 03/13/2015    Priority: Medium    Raynauds syndrome 10/21/2014    Priority: Medium    Primary osteoarthritis of both hands 08/21/2009    Priority: Medium    PVC (premature ventricular contraction) 06/04/2009    Priority: Medium    Mitral valve prolapse 12/25/2008    Priority: Medium    Irritable bowel syndrome 09/04/2008    Priority: Medium    Premature ventricular contractions     Priority: Low   Hepatitis     Priority: Low   History of skin cancer     Priority: Low   Primary osteoarthritis of both feet 10/26/2016    Priority: Low   Anemia 03/13/2015    Priority: Low   Recurrent cold sores 10/21/2014    Priority: Low   Edema 06/29/2011    Priority: Low   Osteopenia 09/05/2008    Priority: Low   GERD 09/04/2008    Priority: Low   ALLERGY 09/04/2008    Priority: Low   COLONIC POLYPS 09/04/2008    Priority: Low   Well woman exam with routine gynecological exam 11/28/2023   Postmenopausal hormone replacement therapy 10/17/2023   Dry eyes, bilateral 05/15/2018   Lumbar radiculopathy 12/12/2017   Allergy    Balance disorder 10/26/2016    Medications- reviewed and updated Current Outpatient Medications  Medication Sig Dispense Refill   aspirin 81 MG tablet Take 81 mg by mouth daily.     carvedilol  (COREG )  3.125 MG tablet TAKE 2 TABLETS BY MOUTH EVERY MORNING AND TAKE 1 TABLET EVERY EVENING 270 tablet 3   Cholecalciferol (VITAMIN D ) 400 UNITS capsule Take 2 capsules by mouth daily     cycloSPORINE (RESTASIS) 0.05 % ophthalmic emulsion Place 1 drop into both eyes 2 (two) times daily.     fish oil-omega-3 fatty acids 1000 MG capsule Take one capsule by mouth  daily     Multiple Vitamins-Minerals (PRESERVISION AREDS PO) Take by mouth.     escitalopram  (LEXAPRO ) 10 MG tablet Take 1 tablet (10 mg total) by mouth daily. 90 tablet 3   losartan  (COZAAR ) 100 MG tablet Take 1 tablet (100 mg total) by mouth daily. 90 tablet 3   No current facility-administered medications for this visit.     Objective:  BP 122/64   Pulse 60   Temp (!) 97.4 F (36.3 C)   Ht 5' 2.5 (1.588 m)   Wt 109 lb 6.4 oz (49.6 kg)   LMP  (LMP Unknown)   SpO2 97%   BMI 19.69 kg/m  Gen: NAD, resting comfortably CV: RRR no murmurs rubs or gallops Lungs: CTAB no crackles, wheeze, rhonchi Ext: trace edema Skin: warm, dry    Assessment and Plan   # UTI at last visit noted with urinary frequency-not sure if symptoms improved- no  bleeding or pain. Daughter noted improved mental clarity afterwards. Afternoon fatigue not as noticeable.   #some numbness in fingertips- no neck pain. Negative tinel and phalen test. No clear cause- continue to monitor- not too bohtersome. Not worse with cold with her raynauds  #hypertension/CKD stage III S: compliant with losartan  100 mg, carvedilol  6.5 mg in the morning and 3.25 mg in the evening  Creatinine has been stable around 0.9-1 but she is rather thin. Avoid nsaids.  Last visit down to 0.85  -trying to exercise regularly- make it to classes. Did cut back on ham and that may have helped. Cut back on table salt BP Readings from Last 3 Encounters:  05/14/24 122/64  02/08/24 (!) 142/62  11/28/23 122/62  A/P: hypertension - well controlled continue current medications  Chronic kidney  disease III - slightly improved lately- continue to monitor    #Paroxysmal atrial fibrillation/palpitations-follows with Dr. Stann Earnest S: Patient previously with episodes of atrial fibrillation related to epinephrine use during dental procedures.  Has not had a known episode since 2014.  She has been continued on aspirin alone since she has been in sinus rhythm for some time.  She is on carvedilol  for blood pressure which would offer some rate control if needed but is primarily used for blood pressure A/P: doing well without recent recurrence- continue to monitor   #anxiety- on Lexapro  10 mg and helpful- with reductions in prempro  going to hold off on changes- reprots some ongoing mild issues but tolerable. Cannot cry on the Lexapro .  - daughter reports fair amount of anxiety still so we opted to stay on same dose.   #Moderate aortic regurgitation-moderate on 01/12/23 S: follows with echocardiograms with cardiology.  Wears compression hose for edema. A/P: doing reasonably well continue to monitor  - plans to call to schedule follow up   #Macular degeneration- new diagnosis in 2024 -in 2025 injections with Dr. Marylee Snowball- wet in one eye and dry in the other - good progress- still getting shots but starting to space to q6 weeks from q4 weeks  #Balance issues- has worked with  Physical therapy and found helpful. Still using cane. Walks with very small gait. Considered seeing them again- I'm open to referral back to them if changes her mind but wants to hold off for now- did at [facility].   Recommended follow up: Return in about 6 months (around 11/13/2024) for followup or sooner if needed.Schedule b4 you leave. Future Appointments  Date Time Provider Department Center  05/16/2024  1:00 PM Nicholas Bari, MD [facility] None  11/26/2024  3:00 PM [facility] ANNUAL WELLNESS VISIT 1 [facility]  11/28/2025 10:00 AM Hines, Raelyn Bulls, MD [facility] None    Lab/Order associations:   ICD-10-CM   1. Paroxysmal  atrial fibrillation (HCC)  I48.0     2. Essential hypertension  I10     3. Stage 3 chronic kidney disease, unspecified whether stage 3a or 3b CKD (HCC)  N18.30       Meds ordered this encounter  Medications   losartan  (COZAAR ) 100 MG tablet    Sig: Take 1 tablet (100 mg total) by mouth daily.    Dispense:  90 tablet    Refill:  3   escitalopram  (LEXAPRO ) 10 MG tablet    Sig: Take 1 tablet (10 mg total) by mouth daily.    Dispense:  90 tablet    Refill:  3    Return precautions advised.  Clarisa Crooked, MD

## 2024-05-16 ENCOUNTER — Ambulatory Visit: Payer: Medicare Other | Attending: Rheumatology | Admitting: Rheumatology

## 2024-05-16 ENCOUNTER — Encounter: Payer: Self-pay | Admitting: Rheumatology

## 2024-05-16 VITALS — BP 139/64 | HR 64 | Resp 14 | Ht 62.0 in | Wt 111.0 lb

## 2024-05-16 DIAGNOSIS — R5383 Other fatigue: Secondary | ICD-10-CM

## 2024-05-16 DIAGNOSIS — M19071 Primary osteoarthritis, right ankle and foot: Secondary | ICD-10-CM | POA: Diagnosis not present

## 2024-05-16 DIAGNOSIS — Z862 Personal history of diseases of the blood and blood-forming organs and certain disorders involving the immune mechanism: Secondary | ICD-10-CM | POA: Insufficient documentation

## 2024-05-16 DIAGNOSIS — M19072 Primary osteoarthritis, left ankle and foot: Secondary | ICD-10-CM

## 2024-05-16 DIAGNOSIS — I7300 Raynaud's syndrome without gangrene: Secondary | ICD-10-CM

## 2024-05-16 DIAGNOSIS — M3500 Sicca syndrome, unspecified: Secondary | ICD-10-CM | POA: Insufficient documentation

## 2024-05-16 DIAGNOSIS — Z9181 History of falling: Secondary | ICD-10-CM

## 2024-05-16 DIAGNOSIS — M19042 Primary osteoarthritis, left hand: Secondary | ICD-10-CM

## 2024-05-16 DIAGNOSIS — Z8679 Personal history of other diseases of the circulatory system: Secondary | ICD-10-CM | POA: Diagnosis not present

## 2024-05-16 DIAGNOSIS — M8589 Other specified disorders of bone density and structure, multiple sites: Secondary | ICD-10-CM | POA: Diagnosis not present

## 2024-05-16 DIAGNOSIS — M19041 Primary osteoarthritis, right hand: Secondary | ICD-10-CM

## 2024-05-16 DIAGNOSIS — R2689 Other abnormalities of gait and mobility: Secondary | ICD-10-CM | POA: Diagnosis not present

## 2024-05-16 DIAGNOSIS — Z8719 Personal history of other diseases of the digestive system: Secondary | ICD-10-CM | POA: Insufficient documentation

## 2024-05-16 DIAGNOSIS — Z8669 Personal history of other diseases of the nervous system and sense organs: Secondary | ICD-10-CM | POA: Diagnosis not present

## 2024-05-16 NOTE — Patient Instructions (Signed)

## 2024-05-23 ENCOUNTER — Other Ambulatory Visit: Payer: Self-pay | Admitting: Family Medicine

## 2024-05-23 MED ORDER — CYCLOSPORINE 0.05 % OP EMUL: 1.0000 [drp] | Freq: Two times a day (BID) | OPHTHALMIC | 3 refills | Status: AC

## 2024-05-23 NOTE — Telephone Encounter (Signed)
 Copied from CRM [id number]. Topic: Clinical - Medication Refill >> May 23, 2024 11:18 AM Henretta I wrote: Medication: cycloSPORINE (RESTASIS) 0.05 % ophthalmic emulsion  Has the patient contacted their pharmacy? No (Agent: If no, request that the patient contact the pharmacy for the refill. If patient does not wish to contact the pharmacy document the reason why and proceed with request.) (Agent: If yes, when and what did the pharmacy advise?)  This is the patient's preferred pharmacy:  [id number] - [place], [place] - [address] [facility] [address] [place] [address] Phone: [phone] Fax: [phone]  Is this the correct pharmacy for this prescription? Yes If no, delete pharmacy and type the correct one.   Has the prescription been filled recently? No  Is the patient out of the medication? No  Has the patient been seen for an appointment in the last year OR does the patient have an upcoming appointment? Yes  Can we respond through MyChart? No  Agent: Please be advised that Rx refills may take up to 3 business days. We ask that you follow-up with your pharmacy.  *if possible patient would like a call to notify if this can be refilled

## 2024-06-11 NOTE — Progress Notes (Signed)
 Cardiology Office Note:  .   Date:  06/25/2024  ID:  Bailey [name], DOB [birth year], MRN [id number] PCP: Katrinka Garnette KIDD, MD  [facility] Providers Cardiologist:  Maude Emmer, MD    History of Present Illness: .   Bailey [name] is a 89 y.o. female  history of PVC;s, HTN and moderate AR,Sicca Syndrome.  Patient is anxious about coming to our new office and BP running high. Denies chest pain. Gets short of breath when doing some exercises. She thinks she hyperventilates. She goes to exercise classes 3 days a week. Once she clears her mind she does better. No palpitations. Very tired and fatigued. Diagnosed with macular degeneration. She wonders if she was in Afib afer she went to the dentist. She felt tired and some palpitations and felt better after she went to sleep. Each episode occurred twice after receiving adrenaline per patient.     ROS:    Studies Reviewed: SABRA    EKG Interpretation Date/Time:  Monday June 25 2024 10:02:41 EDT Ventricular Rate:  68 PR Interval:  190 QRS Duration:  80 QT Interval:  394 QTC Calculation: 418 R Axis:   -48  Text Interpretation: Sinus rhythm with sinus arrhythmia with frequent Premature ventricular complexes Left anterior fascicular block Moderate voltage criteria for LVH, may be normal variant ( R in aVL , Cornell product ) When compared with ECG of 30-May-2023 09:51, Premature ventricular complexes are now Present Confirmed by Parthenia Klinefelter [phone]) on 06/25/2024 10:11:42 AM    Prior CV Studies:   TTE done 01/06/22  showed moderate AR / mild MR with normal LV size and EF 60-65%  TTE done 01/12/23 showed moderate AR/mild-mod MR EF 60-65% stable    Risk Assessment/Calculations:     HYPERTENSION CONTROL Vitals:   06/25/24 0948 06/25/24 1021  BP: (!) 180/70 (!) 170/70    The patient's blood pressure is elevated above target today.  In order to address the patient's elevated BP: Blood pressure will be monitored at home to determine if  medication changes need to be made.; A current anti-hypertensive medication was adjusted today.; The blood pressure is usually elevated in clinic.  Blood pressures monitored at home have been optimal.          Physical Exam:   VS:  BP (!) 170/70 (BP Location: Left Arm)   Pulse 68   Ht 5' 2 (1.575 m)   Wt 110 lb (49.9 kg)   LMP  (LMP Unknown)   BMI 20.12 kg/m    Orhtostatics: No data found. Wt Readings from Last 3 Encounters:  06/25/24 110 lb (49.9 kg)  05/16/24 111 lb (50.3 kg)  05/14/24 109 lb 6.4 oz (49.6 kg)    GEN: Well nourished, well developed in no acute distress NECK: No JVD; No carotid bruits CARDIAC:  RRR, no murmurs, rubs, gallops RESPIRATORY:  Clear to auscultation without rales, wheezing or rhonchi  ABDOMEN: Soft, non-tender, non-distended EXTREMITIES:  No edema; No deformity   ASSESSMENT AND PLAN: .    Palpitations:  PAC;s /PVC;s on beta blocker-increased palpitations while at the dentist and patient was worried she had Afib. Claims they gave her adrenaline. Will order zio for PVC load and arrhythmias   AR:   Moderate compensated LV by TTE 01/12/23  on ARB-repeat echo   HTN:  BP running high today. Very anxious about coming to our new building. She gets anxious checking it on her own. Increase coreg  6.25 mg bid and have someone at facility  check daily for 2 weeks and call us  with results.   Edema:  Venous wearing compression stockings improved     Chest pain:  Improved normal myovue 04/05/16, no recent problems    Sicca Syndrome:  followed by rheumatology        Dispo: f/u Dr. Delford in 6 months  Signed, Olivia Pavy, PA-C

## 2024-06-18 DIAGNOSIS — H353221 Exudative age-related macular degeneration, left eye, with active choroidal neovascularization: Secondary | ICD-10-CM | POA: Diagnosis not present

## 2024-06-25 ENCOUNTER — Ambulatory Visit: Attending: Physician Assistant | Admitting: Physician Assistant

## 2024-06-25 ENCOUNTER — Ambulatory Visit

## 2024-06-25 VITALS — BP 170/70 | HR 68 | Ht 62.0 in | Wt 110.0 lb

## 2024-06-25 DIAGNOSIS — I10 Essential (primary) hypertension: Secondary | ICD-10-CM | POA: Insufficient documentation

## 2024-06-25 DIAGNOSIS — R002 Palpitations: Secondary | ICD-10-CM | POA: Insufficient documentation

## 2024-06-25 DIAGNOSIS — I493 Ventricular premature depolarization: Secondary | ICD-10-CM | POA: Diagnosis not present

## 2024-06-25 DIAGNOSIS — Z87898 Personal history of other specified conditions: Secondary | ICD-10-CM | POA: Diagnosis not present

## 2024-06-25 DIAGNOSIS — R609 Edema, unspecified: Secondary | ICD-10-CM | POA: Diagnosis not present

## 2024-06-25 DIAGNOSIS — I351 Nonrheumatic aortic (valve) insufficiency: Secondary | ICD-10-CM | POA: Insufficient documentation

## 2024-06-25 MED ORDER — CARVEDILOL 6.25 MG PO TABS
6.2500 mg | ORAL_TABLET | Freq: Two times a day (BID) | ORAL | 3 refills | Status: DC
Start: 2024-06-25 — End: 2024-07-23

## 2024-06-25 NOTE — Progress Notes (Unsigned)
 Serial # [id number] from office inventory applied to patient.  Dr. Delford to read.

## 2024-06-25 NOTE — Patient Instructions (Addendum)
 Medication Instructions:  INCREASE COREG  TO 6.25 MG TWICE DAILY  *If you need a refill on your cardiac medications before your next appointment, please call your pharmacy*  Lab Work: NONE If you have labs (blood work) drawn today and your tests are completely normal, you will receive your results only by: MyChart Message (if you have MyChart) OR A paper copy in the mail If you have any lab test that is abnormal or we need to change your treatment, we will call you to review the results.  Testing/Procedures: Your physician has requested that you have an echocardiogram. Echocardiography is a painless test that uses sound waves to create images of your heart. It provides your doctor with information about the size and shape of your heart and how well your heart's chambers and valves are working. This procedure takes approximately one hour. There are no restrictions for this procedure. Please do NOT wear cologne, perfume, aftershave, or lotions (deodorant is allowed). Please arrive 15 minutes prior to your appointment time.  Please note: We ask at that you not bring children with you during ultrasound (echo/ vascular) testing. Due to room size and safety concerns, children are not allowed in the ultrasound rooms during exams. Our front office staff cannot provide observation of children in our lobby area while testing is being conducted. An adult accompanying a patient to their appointment will only be allowed in the ultrasound room at the discretion of the ultrasound technician under special circumstances. We apologize for any inconvenience.   ZIO XT- Long Term Monitor Instructions  Your physician has requested you wear a ZIO patch monitor for 14 days.  This is a single patch monitor. [employer] supplies one patch monitor per enrollment. Additional stickers are not available. Please do not apply patch if you will be having a Nuclear Stress Test,  Echocardiogram, Cardiac CT, MRI, or Chest Xray during  the period you would be wearing the  monitor. The patch cannot be worn during these tests. You cannot remove and re-apply the  ZIO XT patch monitor.  Your ZIO patch monitor will be mailed 3 day USPS to your address on file. It may take 3-5 days  to receive your monitor after you have been enrolled.  Once you have received your monitor, please review the enclosed instructions. Your monitor  has already been registered assigning a specific monitor serial # to you.  Billing and Patient Assistance Program Information  We have supplied [employer] with any of your insurance information on file for billing purposes. [employer] offers a sliding scale Patient Assistance Program for patients that do not have  insurance, or whose insurance does not completely cover the cost of the ZIO monitor.  You must apply for the Patient Assistance Program to qualify for this discounted rate.  To apply, please call [employer] at [phone], select option 4, select option 2, ask to apply for  Patient Assistance Program. Meredeth will ask your household income, and how many people  are in your household. They will quote your out-of-pocket cost based on that information.  [employer] will also be able to set up a 56-month, interest-free payment plan if needed.  Applying the monitor   Shave hair from upper left chest.  Hold abrader disc by orange tab. Rub abrader in 40 strokes over the upper left chest as  indicated in your monitor instructions.  Clean area with 4 enclosed alcohol pads. Let dry.  Apply patch as indicated in monitor instructions. Patch will be placed under collarbone on  left  side of chest with arrow pointing upward.  Rub patch adhesive wings for 2 minutes. Remove white label marked 1. Remove the white  label marked 2. Rub patch adhesive wings for 2 additional minutes.  While looking in a mirror, press and release button in center of patch. A small green light will  flash 3-4 times. This will be your only  indicator that the monitor has been turned on.  Do not shower for the first 24 hours. You may shower after the first 24 hours.  Press the button if you feel a symptom. You will hear a small click. Record Date, Time and  Symptom in the Patient Logbook.  When you are ready to remove the patch, follow instructions on the last 2 pages of Patient  Logbook. Stick patch monitor onto the last page of Patient Logbook.  Place Patient Logbook in the blue and white box. Use locking tab on box and tape box closed  securely. The blue and white box has prepaid postage on it. Please place it in the mailbox as  soon as possible. Your physician should have your test results approximately 7 days after the  monitor has been mailed back to [facility].  Call [facility] Customer Care at [phone] if you have questions regarding  your ZIO XT patch monitor. Call them immediately if you see an orange light blinking on your  monitor.  If your monitor falls off in less than 4 days, contact our Monitor department at [phone].  If your monitor becomes loose or falls off after 4 days call [employer] at [phone] for  suggestions on securing your monitor   Follow-Up: At [facility], you and your health needs are our priority.  As part of our continuing mission to provide you with exceptional heart care, our providers are all part of one team.  This team includes your primary Cardiologist (physician) and Advanced Practice Providers or APPs (Physician Assistants and Nurse Practitioners) who all work together to provide you with the care you need, when you need it.  Your next appointment:   6 month(s)  Provider:   Maude Emmer, MD   We recommend signing up for the patient portal called MyChart.  Sign up information is provided on this After Visit Summary.  MyChart is used to connect with patients for Virtual Visits (Telemedicine).  Patients are able to view lab/test results, encounter notes,  upcoming appointments, etc.  Non-urgent messages can be sent to your provider as well.   To learn more about what you can do with MyChart, go to [email or web address].

## 2024-06-25 NOTE — Addendum Note (Signed)
 Addended by: LENNIE DEFOREST on: 06/25/2024 10:52 AM   Modules accepted: Orders

## 2024-07-12 ENCOUNTER — Telehealth: Payer: Self-pay | Admitting: Cardiovascular Disease

## 2024-07-12 NOTE — Telephone Encounter (Signed)
 Pt c/o BP issue: STAT if pt c/o blurred vision, one-sided weakness or slurred speech.   1. What is your BP concern?  BP is elevated.  2. Have you taken any BP medication today? Yes  3. What are your last 5 BP readings? 8/14: systolic was in the 200's today then gradually lowered. Got down to 140/80.  Has been tracking for 2 weeks, but doesn't have the list with her.  4. Are you having any other symptoms (ex. Dizziness, headache, blurred vision, passed out)?  No

## 2024-07-12 NOTE — Telephone Encounter (Signed)
 Patient says [facility] facility is requesting a copy of complete medication list to be faxed to [phone].

## 2024-07-12 NOTE — Telephone Encounter (Signed)
 I spoke w the patient about her bp readings today.  See other phone encounter for that information.   Medication list faxed to number provided at this time via EPIC fax.

## 2024-07-12 NOTE — Telephone Encounter (Signed)
 Call transferred to triage due to high BP reading earlier today.  The patient feels fine and wonders if anxiety, causing bp to go up so high. Her son wanted him to go w her for the weekend and she was worried about making the trip to [place]  and back - to return Monday.  She said all other readings over past days have been good.  The nurse at [facility] has the list and they are faxing it to [employer] tomorrow.  Fax number provided today.    The patient denies headache, dizziness, any weakness, vision changes or speech changes.  She states she feels completely fine and I've made her aware to call EMS if BP goes back up and is accompanied by the neuro changes, or stays up.  She mailed the Zio back this past Monday.

## 2024-07-13 NOTE — Telephone Encounter (Signed)
 Attempted to contact patient. Left message for patient to call back on personal voicemail.

## 2024-07-16 NOTE — Telephone Encounter (Signed)
 Patient returned RN's call.

## 2024-07-16 NOTE — Telephone Encounter (Signed)
 Spoke with pt, she reports her blood pressure is doing better now. They last reading was 140/78 and she feels it is doing better now. She will have it checked on Thursday's and let us  know if still running high.

## 2024-07-19 DIAGNOSIS — I493 Ventricular premature depolarization: Secondary | ICD-10-CM

## 2024-07-20 ENCOUNTER — Ambulatory Visit: Payer: Self-pay | Admitting: Physician Assistant

## 2024-07-20 NOTE — Telephone Encounter (Signed)
  Patient is returning call. She requesting if she can get a call back before 4:30 pm

## 2024-07-23 ENCOUNTER — Telehealth: Payer: Self-pay | Admitting: Physician Assistant

## 2024-07-23 ENCOUNTER — Encounter: Payer: Self-pay | Admitting: *Deleted

## 2024-07-23 MED ORDER — METOPROLOL TARTRATE 25 MG PO TABS: 25.0000 mg | ORAL_TABLET | Freq: Two times a day (BID) | ORAL | 3 refills | Status: AC

## 2024-07-23 NOTE — Telephone Encounter (Signed)
 Left message for pt to call.

## 2024-07-23 NOTE — Telephone Encounter (Signed)
 pt aware of results and medication change New script sent to the pharmacy  Follow up scheduled

## 2024-07-23 NOTE — Addendum Note (Signed)
 Addended by: RICHIE ADRIEN ORN on: 07/23/2024 12:29 PM   Modules accepted: Orders

## 2024-07-23 NOTE — Telephone Encounter (Signed)
 Patient is returning call for her long term monitor results. She said she will be home until 9:30am.

## 2024-08-01 ENCOUNTER — Ambulatory Visit (HOSPITAL_COMMUNITY)
Admission: RE | Admit: 2024-08-01 | Discharge: 2024-08-01 | Disposition: A | Discharge status: Home / Self Care | Source: Ambulatory Visit | Attending: Internal Medicine | Admitting: Internal Medicine

## 2024-08-01 DIAGNOSIS — I351 Nonrheumatic aortic (valve) insufficiency: Secondary | ICD-10-CM | POA: Insufficient documentation

## 2024-08-01 LAB — ECHOCARDIOGRAM COMPLETE
Area-P 1/2: 4.68 cm2
MV M vel: 5.8 m/s
MV Peak grad: 134.6 mmHg
P 1/2 time: 345 ms
Radius: 0.5 cm
S' Lateral: 2.6 cm

## 2024-08-02 DIAGNOSIS — M79675 Pain in left toe(s): Secondary | ICD-10-CM | POA: Diagnosis not present

## 2024-08-02 DIAGNOSIS — N189 Chronic kidney disease, unspecified: Secondary | ICD-10-CM | POA: Diagnosis not present

## 2024-08-02 DIAGNOSIS — B351 Tinea unguium: Secondary | ICD-10-CM | POA: Diagnosis not present

## 2024-08-02 DIAGNOSIS — M2011 Hallux valgus (acquired), right foot: Secondary | ICD-10-CM | POA: Diagnosis not present

## 2024-08-13 DIAGNOSIS — H353221 Exudative age-related macular degeneration, left eye, with active choroidal neovascularization: Secondary | ICD-10-CM | POA: Diagnosis not present

## 2024-08-13 NOTE — Progress Notes (Signed)
 Cardiology Office Note:  .   Date:  08/20/2024  ID:  Bailey [name], DOB [birth year], MRN [id number] PCP: Katrinka Garnette KIDD, MD  [facility] Providers Cardiologist:  Maude Emmer, MD    History of Present Illness: .   Bailey [name] is a 89 y.o. female  history of PVC;s, HTN and moderate AR,Sicca Syndrome.  I saw the patient 06/25/24 with elevated BP's and increase palpitations. Monitor showed a lot of SVT. I stopped coreg  and starter her on metoprolol .  Patient here alone. She feels much better. BP lower and no further palpitations on metoprolol .   ROS:    Studies Reviewed: SABRA         Prior CV Studies: ECHO COMPLETE WO IMAGING ENHANCING AGENT 08/01/2024  Narrative ECHOCARDIOGRAM REPORT    Patient Name:   Bailey [name]   Date of Exam: 08/01/2024 Medical Rec #:  [id number]     Height:       62.0 in Accession #:    [id number]    Weight:       110.0 lb Date of Birth:  1933-05-22     BSA:          1.483 m Patient Age:    89 years      BP:           139/64 mmHg Patient Gender: F             HR:           54 bpm. Exam Location:  [address]  Procedure: 2D Echo, Color Doppler, Cardiac Doppler and 3D Echo (Both Spectral and Color Flow Doppler were utilized during procedure).  Indications:    Aortic Insufficiency I35.1  History:        Patient has prior history of Echocardiogram examinations, most recent 01/12/2023. CKD; Risk Factors:Hypertension.  Sonographer:    Augustin Seals RDCS Referring Phys: [id number] Keimora Swartout M Whitten Andreoni  IMPRESSIONS   1. Left ventricular ejection fraction, by estimation, is 65 to 70%. The left ventricle has normal function. The left ventricle has no regional wall motion abnormalities. Left ventricular diastolic parameters are indeterminate. 2. Right ventricular systolic function is normal. The right ventricular size is normal. There is moderately elevated pulmonary artery systolic pressure. The estimated right ventricular systolic pressure is 57.8 mmHg. 3.  The mitral valve is degenerative. Moderate mitral valve regurgitation. No evidence of mitral stenosis. 4. Tricuspid valve regurgitation is moderate. 5. The aortic valve is grossly normal. There is mild calcification of the aortic valve. Aortic valve regurgitation is moderate. No aortic stenosis is present. 6. Pulmonic valve regurgitation is moderate. 7. The inferior vena cava is dilated in size with <50% respiratory variability, suggesting right atrial pressure of 15 mmHg.  FINDINGS Left Ventricle: Left ventricular ejection fraction, by estimation, is 65 to 70%. The left ventricle has normal function. The left ventricle has no regional wall motion abnormalities. The left ventricular internal cavity size was normal in size. There is borderline left ventricular hypertrophy. Left ventricular diastolic parameters are indeterminate.  Right Ventricle: The right ventricular size is normal. No increase in right ventricular wall thickness. Right ventricular systolic function is normal. There is moderately elevated pulmonary artery systolic pressure. The tricuspid regurgitant velocity is 3.27 m/s, and with an assumed right atrial pressure of 15 mmHg, the estimated right ventricular systolic pressure is 57.8 mmHg.  Left Atrium: Left atrial size was normal in size.  Right Atrium: Right atrial size was normal in size.  Pericardium: There  is no evidence of pericardial effusion.  Mitral Valve: The mitral valve is degenerative in appearance. Moderate mitral valve regurgitation. No evidence of mitral valve stenosis.  Tricuspid Valve: The tricuspid valve is normal in structure. Tricuspid valve regurgitation is moderate . No evidence of tricuspid stenosis.  Aortic Valve: The aortic valve is grossly normal. There is mild calcification of the aortic valve. Aortic valve regurgitation is moderate. Aortic regurgitation PHT measures 345 msec. No aortic stenosis is present.  Pulmonic Valve: The pulmonic valve was  normal in structure. Pulmonic valve regurgitation is moderate. No evidence of pulmonic stenosis.  Aorta: The aortic root is normal in size and structure. Ascending aorta measurements are within normal limits for age when indexed to body surface area.  Venous: The inferior vena cava is dilated in size with less than 50% respiratory variability, suggesting right atrial pressure of 15 mmHg.  IAS/Shunts: No atrial level shunt detected by color flow Doppler.  Additional Comments: 3D was performed not requiring image post processing on an independent workstation and was normal.   LEFT VENTRICLE PLAX 2D LVIDd:         3.60 cm   Diastology LVIDs:         2.60 cm   LV e' medial:    7.40 cm/s LV PW:         1.00 cm   LV E/e' medial:  14.5 LV IVS:        1.00 cm   LV e' lateral:   7.83 cm/s LVOT diam:     2.00 cm   LV E/e' lateral: 13.7 LV SV:         81 LV SV Index:   54 LVOT Area:     3.14 cm  3D Volume EF: 3D EF:        54 % LV EDV:       84 ml LV ESV:       39 ml LV SV:        45 ml  RIGHT VENTRICLE             IVC RV Basal diam:  3.60 cm     IVC diam: 2.10 cm RV Mid diam:    2.90 cm RV S prime:     10.10 cm/s TAPSE (M-mode): 1.8 cm  LEFT ATRIUM             Index        RIGHT ATRIUM           Index LA diam:        3.30 cm 2.23 cm/m   RA Area:     14.80 cm LA Vol (A2C):   22.1 ml 14.90 ml/m  RA Volume:   37.50 ml  25.29 ml/m LA Vol (A4C):   20.6 ml 13.89 ml/m LA Biplane Vol: 22.1 ml 14.90 ml/m AORTIC VALVE LVOT Vmax:   101.00 cm/s LVOT Vmean:  68.100 cm/s LVOT VTI:    0.257 m AI PHT:      345 msec  AORTA Ao Root diam: 3.10 cm Ao Asc diam:  3.70 cm  MITRAL VALVE                  TRICUSPID VALVE MV Area (PHT): 4.68 cm       TR Peak grad:   42.8 mmHg MV Decel Time: 162 msec       TR Vmax:        327.00 cm/s MR Peak grad:    134.6  mmHg MR Mean grad:    94.0 mmHg    SHUNTS MR Vmax:         580.00 cm/s  Systemic VTI:  0.26 m MR Vmean:        462.0 cm/s   Systemic  Diam: 2.00 cm MR PISA:         1.57 cm MR PISA Eff ROA: 9 mm MR PISA Radius:  0.50 cm MV E velocity: 107.00 cm/s MV A velocity: 78.60 cm/s MV E/A ratio:  1.36  Soyla Merck MD Electronically signed by Soyla Merck MD Signature Date/Time: 08/01/2024/8:46:50 PM    Final   No results found for this or any previous visit from the past 3650 days.       Risk Assessment/Calculations:             Physical Exam:   VS:  BP 118/62 (BP Location: Left Arm, Patient Position: Sitting, Cuff Size: Normal)   Pulse 78   Ht 5' 2 (1.575 m)   Wt 110 lb (49.9 kg)   LMP  (LMP Unknown)   SpO2 96%   BMI 20.12 kg/m    Orhtostatics: No data found. Wt Readings from Last 3 Encounters:  08/20/24 110 lb (49.9 kg)  06/25/24 110 lb (49.9 kg)  05/16/24 111 lb (50.3 kg)    GEN: Thin, elderly, in no acute distress NECK: No JVD; No carotid bruits CARDIAC:  RRR, no murmurs, rubs, gallops RESPIRATORY:  Clear to auscultation without rales, wheezing or rhonchi  ABDOMEN: Soft, non-tender, non-distended EXTREMITIES:  No edema; No deformity   ASSESSMENT AND PLAN: .   Palpitations: Zio showed 99 SVT runs, 4 NSVT. I changed coreg  to metoprolol  and now she denies palpitations. Check labs today  AR:   Moderate AI/moderate MR echo 07/2024   HTN:  BP much better on metoprolol    Edema:  Venous wearing compression stockings improved     Chest pain:  Improved normal myovue 04/05/16, no recent problems    Sicca Syndrome:  followed by rheumatology        Dispo: f/u Dr Delford 4 months  Signed, Olivia Pavy, PA-C

## 2024-08-19 DIAGNOSIS — Z23 Encounter for immunization: Secondary | ICD-10-CM | POA: Diagnosis not present

## 2024-08-20 ENCOUNTER — Encounter: Payer: Self-pay | Admitting: Physician Assistant

## 2024-08-20 ENCOUNTER — Ambulatory Visit: Attending: Physician Assistant | Admitting: Physician Assistant

## 2024-08-20 VITALS — BP 118/62 | HR 78 | Ht 62.0 in | Wt 110.0 lb

## 2024-08-20 DIAGNOSIS — R002 Palpitations: Secondary | ICD-10-CM | POA: Insufficient documentation

## 2024-08-20 DIAGNOSIS — Z87898 Personal history of other specified conditions: Secondary | ICD-10-CM | POA: Insufficient documentation

## 2024-08-20 DIAGNOSIS — I351 Nonrheumatic aortic (valve) insufficiency: Secondary | ICD-10-CM | POA: Diagnosis not present

## 2024-08-20 DIAGNOSIS — R609 Edema, unspecified: Secondary | ICD-10-CM | POA: Diagnosis not present

## 2024-08-20 DIAGNOSIS — I10 Essential (primary) hypertension: Secondary | ICD-10-CM | POA: Insufficient documentation

## 2024-08-20 NOTE — Patient Instructions (Signed)
 Medication Instructions:  Your physician recommends that you continue on your current medications as directed. Please refer to the Current Medication list given to you today.  *If you need a refill on your cardiac medications before your next appointment, please call your pharmacy*  Lab Work: [facility], CMET,TSH If you have labs (blood work) drawn today and your tests are completely normal, you will receive your results only by: MyChart Message (if you have MyChart) OR A paper copy in the mail If you have any lab test that is abnormal or we need to change your treatment, we will call you to review the results.  Your next appointment:   4 month(s)  Provider:   Maude Emmer, MD

## 2024-08-21 ENCOUNTER — Ambulatory Visit: Payer: Self-pay | Admitting: Physician Assistant

## 2024-08-21 LAB — COMPREHENSIVE METABOLIC PANEL WITH GFR
ALT: 21 IU/L (ref 0–32)
AST: 18 IU/L (ref 0–40)
Albumin: 4.2 g/dL (ref 3.7–4.7)
Alkaline Phosphatase: 106 IU/L (ref 48–129)
BUN/Creatinine Ratio: 18 (ref 12–28)
BUN: 15 mg/dL (ref 8–27)
Bilirubin Total: 0.6 mg/dL (ref 0.0–1.2)
CO2: 23 mmol/L (ref 20–29)
Calcium: 9.5 mg/dL (ref 8.7–10.3)
Chloride: 94 mmol/L — AB (ref 96–106)
Creatinine, Ser: 0.85 mg/dL (ref 0.57–1.00)
Globulin, Total: 2.4 g/dL (ref 1.5–4.5)
Glucose: 90 mg/dL (ref 70–99)
Potassium: 4.2 mmol/L (ref 3.5–5.2)
Sodium: 130 mmol/L — AB (ref 134–144)
Total Protein: 6.6 g/dL (ref 6.0–8.5)
eGFR: 65 mL/min/1.73 (ref >59)

## 2024-08-21 LAB — CBC
Hematocrit: 36 % (ref 34.0–46.6)
Hemoglobin: 11.7 g/dL (ref 11.1–15.9)
MCH: 30.3 pg (ref 26.6–33.0)
MCHC: 32.5 g/dL (ref 31.5–35.7)
MCV: 93 fL (ref 79–97)
Platelets: 305 x10E3/uL (ref 150–450)
RBC: 3.86 x10E6/uL (ref 3.77–5.28)
RDW: 12.2 % (ref 11.7–15.4)
WBC: 11 x10E3/uL — ABNORMAL HIGH (ref 3.4–10.8)

## 2024-08-21 LAB — TSH: TSH: 3.07 u[IU]/mL (ref 0.450–4.500)

## 2024-08-27 DIAGNOSIS — Z23 Encounter for immunization: Secondary | ICD-10-CM | POA: Diagnosis not present

## 2024-09-03 DIAGNOSIS — L821 Other seborrheic keratosis: Secondary | ICD-10-CM | POA: Diagnosis not present

## 2024-09-03 DIAGNOSIS — L720 Epidermal cyst: Secondary | ICD-10-CM | POA: Diagnosis not present

## 2024-09-03 DIAGNOSIS — Z85828 Personal history of other malignant neoplasm of skin: Secondary | ICD-10-CM | POA: Diagnosis not present

## 2024-09-17 ENCOUNTER — Ambulatory Visit: Payer: Self-pay

## 2024-09-17 NOTE — Telephone Encounter (Signed)
 For home health physical therapy referral unfortunately require a face-to-face visit within 30 days so I cannot provide that  We might be able to do a referral to outpatient PT if she could be transported for a day by family under generalized weakness and fall if they want to try that  We are also happy to try to see her sooner but I understand they want to hold off on that for now

## 2024-09-17 NOTE — Telephone Encounter (Signed)
 FYI Only or Action Required?: Action required by provider: referral request and HH PT.  Patient was last seen in primary care on 05/14/2024 by Katrinka Garnette KIDD, MD.  Called Nurse Triage reporting Advice Only.  Symptoms began several weeks ago.  Interventions attempted: Rest, hydration, or home remedies.  Symptoms are: gradually worsening.  Triage Disposition: No disposition on file.  Patient/caregiver understands and will follow disposition?:      Copied from CRM [id number]. Topic: Clinical - Red Word Triage >> Sep 17, 2024  8:38 AM Treva T wrote: [facility] that prompted transfer to Nurse Triage: Daughter, Arland, is concerned regarding mother worsening confusion and getting confused, and increased anxiety. She is becoming more forgetful, memory changes.   States she is becoming slowler, and fragile.  Friday 09/14/24 - she fell, but couldn't say why she fell, seems increasingly faraile, thinks it is time to get a walker, has only been using a cane.   Per patient daughter reports that she offered to schedule an appointment to see provider but refuses to be seen, states she can wait until her appointment in December.   States she is becoming slowler, and fragile.  Paitent refuses to use a walker, unles a therapist shows her how to use one. Reason for Disposition  [1] Follow-up call from patient regarding patient's clinical status AND [2] information NON-URGENT  Answer Assessment - Initial Assessment Questions 1. REASON FOR CALL or QUESTION: What is your reason for calling today? or How can I best      Pt's daughter has increasing concerns about pt's mobility, gait. Worsening anxiety, minor memory concerns. Pt had fall Friday evening, medics assessed, pt declined to be transported to hospital. Requesting orders for [facility] PT, Neurology. Pt is slowing in her ambulation, refuses to come in prior to December appointment  2. CALLER: Document the source of call. (e.g., laboratory staff,  caregiver or patient).      Daughter [phone]  Protocols used: PCP Call - No Triage-A-AH

## 2024-09-17 NOTE — Telephone Encounter (Signed)
Please see patient message and advise.

## 2024-09-18 ENCOUNTER — Telehealth: Payer: Self-pay | Admitting: Family Medicine

## 2024-09-18 NOTE — Telephone Encounter (Signed)
 Patient set up for PT and OT  Copied from CRM #[id number]. Topic: Clinical - Medical Advice >> Sep 18, 2024  1:54 PM Terri G wrote: Reason for CRM: Patients daughter Bailey [name] is calling regarding her mom because she was told she was going to received a call and no one called her or her mom. Called [facility] 3x and no one answered. Did relay the msg Dr.Hunter wrote from yesterday and Bailey [name] had some follow up questions about it for moms PT. She stated if when you call and she doesn't answer leave a VM. Callback number [phone]

## 2024-09-18 NOTE — Telephone Encounter (Signed)
 Spoke with [facility] and her daughter to get her scheduled for PT.

## 2024-09-19 LAB — HM MAMMOGRAPHY

## 2024-09-21 ENCOUNTER — Encounter: Payer: Self-pay | Admitting: Obstetrics and Gynecology

## 2024-09-22 ENCOUNTER — Other Ambulatory Visit: Payer: Self-pay

## 2024-09-22 ENCOUNTER — Inpatient Hospital Stay (HOSPITAL_COMMUNITY)
Admission: EM | Admit: 2024-09-22 | Discharge: 2024-09-27 | DRG: 871 | Disposition: A | Discharge status: Skilled Nursing Facility | Source: Skilled Nursing Facility | Attending: Internal Medicine | Admitting: Internal Medicine

## 2024-09-22 ENCOUNTER — Emergency Department (HOSPITAL_COMMUNITY)

## 2024-09-22 ENCOUNTER — Encounter (HOSPITAL_COMMUNITY): Payer: Self-pay | Admitting: Emergency Medicine

## 2024-09-22 ENCOUNTER — Inpatient Hospital Stay (HOSPITAL_COMMUNITY)

## 2024-09-22 DIAGNOSIS — S0990XA Unspecified injury of head, initial encounter: Secondary | ICD-10-CM | POA: Diagnosis not present

## 2024-09-22 DIAGNOSIS — R531 Weakness: Secondary | ICD-10-CM | POA: Diagnosis present

## 2024-09-22 DIAGNOSIS — I7300 Raynaud's syndrome without gangrene: Secondary | ICD-10-CM | POA: Diagnosis present

## 2024-09-22 DIAGNOSIS — R338 Other retention of urine: Secondary | ICD-10-CM | POA: Diagnosis not present

## 2024-09-22 DIAGNOSIS — R6520 Severe sepsis without septic shock: Principal | ICD-10-CM | POA: Diagnosis present

## 2024-09-22 DIAGNOSIS — Z87440 Personal history of urinary (tract) infections: Secondary | ICD-10-CM

## 2024-09-22 DIAGNOSIS — A419 Sepsis, unspecified organism: Principal | ICD-10-CM | POA: Diagnosis present

## 2024-09-22 DIAGNOSIS — R569 Unspecified convulsions: Secondary | ICD-10-CM

## 2024-09-22 DIAGNOSIS — I351 Nonrheumatic aortic (valve) insufficiency: Secondary | ICD-10-CM | POA: Diagnosis present

## 2024-09-22 DIAGNOSIS — Z79899 Other long term (current) drug therapy: Secondary | ICD-10-CM

## 2024-09-22 DIAGNOSIS — Z9049 Acquired absence of other specified parts of digestive tract: Secondary | ICD-10-CM

## 2024-09-22 DIAGNOSIS — R339 Retention of urine, unspecified: Secondary | ICD-10-CM | POA: Diagnosis not present

## 2024-09-22 DIAGNOSIS — K7689 Other specified diseases of liver: Secondary | ICD-10-CM | POA: Diagnosis not present

## 2024-09-22 DIAGNOSIS — G9340 Encephalopathy, unspecified: Secondary | ICD-10-CM | POA: Diagnosis not present

## 2024-09-22 DIAGNOSIS — D631 Anemia in chronic kidney disease: Secondary | ICD-10-CM | POA: Diagnosis present

## 2024-09-22 DIAGNOSIS — Z882 Allergy status to sulfonamides status: Secondary | ICD-10-CM | POA: Diagnosis not present

## 2024-09-22 DIAGNOSIS — K838 Other specified diseases of biliary tract: Secondary | ICD-10-CM | POA: Diagnosis not present

## 2024-09-22 DIAGNOSIS — R2681 Unsteadiness on feet: Secondary | ICD-10-CM | POA: Diagnosis not present

## 2024-09-22 DIAGNOSIS — I480 Paroxysmal atrial fibrillation: Secondary | ICD-10-CM | POA: Diagnosis present

## 2024-09-22 DIAGNOSIS — B9620 Unspecified Escherichia coli [E. coli] as the cause of diseases classified elsewhere: Secondary | ICD-10-CM | POA: Diagnosis present

## 2024-09-22 DIAGNOSIS — Z1152 Encounter for screening for COVID-19: Secondary | ICD-10-CM | POA: Diagnosis not present

## 2024-09-22 DIAGNOSIS — R29810 Facial weakness: Secondary | ICD-10-CM | POA: Diagnosis not present

## 2024-09-22 DIAGNOSIS — N1330 Unspecified hydronephrosis: Secondary | ICD-10-CM | POA: Diagnosis not present

## 2024-09-22 DIAGNOSIS — Z91048 Other nonmedicinal substance allergy status: Secondary | ICD-10-CM

## 2024-09-22 DIAGNOSIS — K219 Gastro-esophageal reflux disease without esophagitis: Secondary | ICD-10-CM | POA: Diagnosis present

## 2024-09-22 DIAGNOSIS — Z8249 Family history of ischemic heart disease and other diseases of the circulatory system: Secondary | ICD-10-CM | POA: Diagnosis not present

## 2024-09-22 DIAGNOSIS — Z043 Encounter for examination and observation following other accident: Secondary | ICD-10-CM | POA: Diagnosis not present

## 2024-09-22 DIAGNOSIS — R4182 Altered mental status, unspecified: Secondary | ICD-10-CM | POA: Diagnosis not present

## 2024-09-22 DIAGNOSIS — Z881 Allergy status to other antibiotic agents status: Secondary | ICD-10-CM

## 2024-09-22 DIAGNOSIS — N390 Urinary tract infection, site not specified: Secondary | ICD-10-CM | POA: Diagnosis present

## 2024-09-22 DIAGNOSIS — Z85820 Personal history of malignant melanoma of skin: Secondary | ICD-10-CM

## 2024-09-22 DIAGNOSIS — I499 Cardiac arrhythmia, unspecified: Secondary | ICD-10-CM | POA: Diagnosis not present

## 2024-09-22 DIAGNOSIS — Z87891 Personal history of nicotine dependence: Secondary | ICD-10-CM

## 2024-09-22 DIAGNOSIS — M5030 Other cervical disc degeneration, unspecified cervical region: Secondary | ICD-10-CM | POA: Diagnosis not present

## 2024-09-22 DIAGNOSIS — I491 Atrial premature depolarization: Secondary | ICD-10-CM | POA: Diagnosis not present

## 2024-09-22 DIAGNOSIS — G9341 Metabolic encephalopathy: Secondary | ICD-10-CM | POA: Diagnosis present

## 2024-09-22 DIAGNOSIS — I10 Essential (primary) hypertension: Secondary | ICD-10-CM | POA: Diagnosis present

## 2024-09-22 DIAGNOSIS — R6510 Systemic inflammatory response syndrome (SIRS) of non-infectious origin without acute organ dysfunction: Secondary | ICD-10-CM | POA: Diagnosis present

## 2024-09-22 DIAGNOSIS — K5730 Diverticulosis of large intestine without perforation or abscess without bleeding: Secondary | ICD-10-CM | POA: Diagnosis not present

## 2024-09-22 DIAGNOSIS — R2689 Other abnormalities of gait and mobility: Secondary | ICD-10-CM | POA: Diagnosis not present

## 2024-09-22 DIAGNOSIS — I129 Hypertensive chronic kidney disease with stage 1 through stage 4 chronic kidney disease, or unspecified chronic kidney disease: Secondary | ICD-10-CM | POA: Diagnosis present

## 2024-09-22 DIAGNOSIS — I6523 Occlusion and stenosis of bilateral carotid arteries: Secondary | ICD-10-CM | POA: Diagnosis not present

## 2024-09-22 DIAGNOSIS — Z781 Physical restraint status: Secondary | ICD-10-CM

## 2024-09-22 DIAGNOSIS — N183 Chronic kidney disease, stage 3 unspecified: Secondary | ICD-10-CM | POA: Diagnosis present

## 2024-09-22 DIAGNOSIS — M4312 Spondylolisthesis, cervical region: Secondary | ICD-10-CM | POA: Diagnosis not present

## 2024-09-22 DIAGNOSIS — M3501 Sicca syndrome with keratoconjunctivitis: Secondary | ICD-10-CM | POA: Diagnosis present

## 2024-09-22 DIAGNOSIS — Z7982 Long term (current) use of aspirin: Secondary | ICD-10-CM

## 2024-09-22 DIAGNOSIS — M069 Rheumatoid arthritis, unspecified: Secondary | ICD-10-CM | POA: Diagnosis present

## 2024-09-22 DIAGNOSIS — R457 State of emotional shock and stress, unspecified: Secondary | ICD-10-CM | POA: Diagnosis not present

## 2024-09-22 DIAGNOSIS — Z880 Allergy status to penicillin: Secondary | ICD-10-CM | POA: Diagnosis not present

## 2024-09-22 DIAGNOSIS — M6281 Muscle weakness (generalized): Secondary | ICD-10-CM | POA: Diagnosis not present

## 2024-09-22 DIAGNOSIS — Z888 Allergy status to other drugs, medicaments and biological substances status: Secondary | ICD-10-CM | POA: Diagnosis not present

## 2024-09-22 DIAGNOSIS — D649 Anemia, unspecified: Secondary | ICD-10-CM | POA: Diagnosis present

## 2024-09-22 DIAGNOSIS — S199XXA Unspecified injury of neck, initial encounter: Secondary | ICD-10-CM | POA: Diagnosis not present

## 2024-09-22 DIAGNOSIS — I700 Atherosclerosis of aorta: Secondary | ICD-10-CM | POA: Diagnosis not present

## 2024-09-22 LAB — RESP PANEL BY RT-PCR (RSV, FLU A&B, COVID)  RVPGX2
Influenza A by PCR: NEGATIVE
Influenza B by PCR: NEGATIVE
Resp Syncytial Virus by PCR: NEGATIVE
SARS Coronavirus 2 by RT PCR: NEGATIVE

## 2024-09-22 LAB — CBC WITH DIFFERENTIAL/PLATELET
Abs Immature Granulocytes: 0.11 K/uL — ABNORMAL HIGH (ref 0.00–0.07)
Basophils Absolute: 0.1 K/uL (ref 0.0–0.1)
Basophils Relative: 0 %
Eosinophils Absolute: 0 K/uL (ref 0.0–0.5)
Eosinophils Relative: 0 %
HCT: 38.3 % (ref 36.0–46.0)
Hemoglobin: 12.9 g/dL (ref 12.0–15.0)
Immature Granulocytes: 1 %
Lymphocytes Relative: 7 %
Lymphs Abs: 1.3 K/uL (ref 0.7–4.0)
MCH: 30.3 pg (ref 26.0–34.0)
MCHC: 33.7 g/dL (ref 30.0–36.0)
MCV: 89.9 fL (ref 80.0–100.0)
Monocytes Absolute: 1.4 K/uL — ABNORMAL HIGH (ref 0.1–1.0)
Monocytes Relative: 7 %
Neutro Abs: 17 K/uL — ABNORMAL HIGH (ref 1.7–7.7)
Neutrophils Relative %: 85 %
Platelets: 346 K/uL (ref 150–400)
RBC: 4.26 MIL/uL (ref 3.87–5.11)
RDW: 13 % (ref 11.5–15.5)
WBC: 19.8 K/uL — ABNORMAL HIGH (ref 4.0–10.5)
nRBC: 0 % (ref 0.0–0.2)

## 2024-09-22 LAB — CBG MONITORING, ED: Glucose-Capillary: 156 mg/dL — ABNORMAL HIGH (ref 70–99)

## 2024-09-22 LAB — COMPREHENSIVE METABOLIC PANEL WITH GFR
ALT: 34 U/L (ref 0–44)
ALT: 34 U/L (ref 0–44)
AST: 40 U/L (ref 15–41)
AST: 43 U/L — ABNORMAL HIGH (ref 15–41)
Albumin: 4 g/dL (ref 3.5–5.0)
Albumin: 4.1 g/dL (ref 3.5–5.0)
Alkaline Phosphatase: 81 U/L (ref 38–126)
Alkaline Phosphatase: 90 U/L (ref 38–126)
Anion gap: 15 (ref 5–15)
Anion gap: 16 — ABNORMAL HIGH (ref 5–15)
BUN: 13 mg/dL (ref 8–23)
BUN: 13 mg/dL (ref 8–23)
CO2: 23 mmol/L (ref 22–32)
CO2: 19 mmol/L — ABNORMAL LOW (ref 22–32)
Calcium: 9 mg/dL (ref 8.9–10.3)
Calcium: 9.3 mg/dL (ref 8.9–10.3)
Chloride: 90 mmol/L — ABNORMAL LOW (ref 98–111)
Chloride: 94 mmol/L — ABNORMAL LOW (ref 98–111)
Creatinine, Ser: 0.95 mg/dL (ref 0.44–1.00)
Creatinine, Ser: 0.91 mg/dL (ref 0.44–1.00)
GFR, Estimated: 57 mL/min — ABNORMAL LOW (ref >60)
GFR, Estimated: >60 mL/min (ref >60)
Glucose, Bld: 167 mg/dL — ABNORMAL HIGH (ref 70–99)
Glucose, Bld: 158 mg/dL — ABNORMAL HIGH (ref 70–99)
Potassium: 3.3 mmol/L — ABNORMAL LOW (ref 3.5–5.1)
Potassium: 3.5 mmol/L (ref 3.5–5.1)
Sodium: 128 mmol/L — ABNORMAL LOW (ref 135–145)
Sodium: 129 mmol/L — ABNORMAL LOW (ref 135–145)
Total Bilirubin: 1.2 mg/dL (ref 0.0–1.2)
Total Bilirubin: 1 mg/dL (ref 0.0–1.2)
Total Protein: 7.7 g/dL (ref 6.5–8.1)
Total Protein: 7.5 g/dL (ref 6.5–8.1)

## 2024-09-22 LAB — URINALYSIS, ROUTINE W REFLEX MICROSCOPIC
Bilirubin Urine: NEGATIVE
Glucose, UA: NEGATIVE mg/dL
Hgb urine dipstick: NEGATIVE
Ketones, ur: NEGATIVE mg/dL
Leukocytes,Ua: NEGATIVE
Nitrite: NEGATIVE
Protein, ur: 100 mg/dL — AB
Specific Gravity, Urine: 1.013 (ref 1.005–1.030)
pH: 7 (ref 5.0–8.0)

## 2024-09-22 LAB — I-STAT VENOUS BLOOD GAS, ED
Acid-Base Excess: 1 mmol/L (ref 0.0–2.0)
Bicarbonate: 25 mmol/L (ref 20.0–28.0)
Calcium, Ion: 1.13 mmol/L — ABNORMAL LOW (ref 1.15–1.40)
HCT: 41 % (ref 36.0–46.0)
Hemoglobin: 13.9 g/dL (ref 12.0–15.0)
O2 Saturation: 90 %
Potassium: 3.3 mmol/L — ABNORMAL LOW (ref 3.5–5.1)
Sodium: 130 mmol/L — ABNORMAL LOW (ref 135–145)
TCO2: 26 mmol/L (ref 22–32)
pCO2, Ven: 37.8 mmHg — ABNORMAL LOW (ref 44–60)
pH, Ven: 7.428 (ref 7.25–7.43)
pO2, Ven: 58 mmHg — ABNORMAL HIGH (ref 32–45)

## 2024-09-22 LAB — PROTIME-INR
INR: 1.1 (ref 0.8–1.2)
Prothrombin Time: 14.8 s (ref 11.4–15.2)

## 2024-09-22 LAB — I-STAT CG4 LACTIC ACID, ED
Lactic Acid, Venous: 3.4 mmol/L (ref 0.5–1.9)
Lactic Acid, Venous: 2.1 mmol/L (ref 0.5–1.9)

## 2024-09-22 LAB — MAGNESIUM: Magnesium: 1.6 mg/dL — ABNORMAL LOW (ref 1.7–2.4)

## 2024-09-22 LAB — CK: Total CK: 153 U/L (ref 38–234)

## 2024-09-22 MED ORDER — METOPROLOL TARTRATE 25 MG PO TABS
25.0000 mg | ORAL_TABLET | Freq: Two times a day (BID) | ORAL | Status: DC
  Administered 2024-09-23 – 2024-09-24 (×3): 25 mg via ORAL
  Filled 2024-09-22 (×4): qty 1

## 2024-09-22 MED ORDER — CYCLOSPORINE 0.05 % OP EMUL
1.0000 [drp] | Freq: Two times a day (BID) | OPHTHALMIC | Status: DC
  Administered 2024-09-22 – 2024-09-27 (×10): 1 [drp] via OPHTHALMIC
  Filled 2024-09-22 (×12): qty 30

## 2024-09-22 MED ORDER — ACETAMINOPHEN 650 MG RE SUPP
650.0000 mg | Freq: Four times a day (QID) | RECTAL | Status: DC | PRN
  Administered 2024-09-22: 650 mg via RECTAL
  Filled 2024-09-22: qty 1

## 2024-09-22 MED ORDER — VANCOMYCIN HCL IN DEXTROSE 1-5 GM/200ML-% IV SOLN: 1000.0000 mg | Freq: Once | INTRAVENOUS | Status: DC

## 2024-09-22 MED ORDER — LOSARTAN POTASSIUM 50 MG PO TABS
100.0000 mg | ORAL_TABLET | Freq: Every day | ORAL | Status: DC
  Administered 2024-09-23 – 2024-09-27 (×5): 100 mg via ORAL
  Filled 2024-09-22 (×5): qty 2

## 2024-09-22 MED ORDER — IOHEXOL 350 MG/ML SOLN
75.0000 mL | Freq: Once | INTRAVENOUS | Status: AC | PRN
  Administered 2024-09-22: 75 mL via INTRAVENOUS

## 2024-09-22 MED ORDER — SODIUM CHLORIDE 0.9% FLUSH
3.0000 mL | Freq: Two times a day (BID) | INTRAVENOUS | Status: DC
  Administered 2024-09-22 – 2024-09-27 (×9): 3 mL via INTRAVENOUS

## 2024-09-22 MED ORDER — SODIUM CHLORIDE 0.9 % IV SOLN
1.0000 g | Freq: Three times a day (TID) | INTRAVENOUS | Status: DC
  Administered 2024-09-23 – 2024-09-25 (×7): 1 g via INTRAVENOUS
  Filled 2024-09-22 (×10): qty 5

## 2024-09-22 MED ORDER — SODIUM CHLORIDE 0.9 % IV BOLUS (SEPSIS)
1000.0000 mL | Freq: Once | INTRAVENOUS | Status: AC
  Administered 2024-09-22: 1000 mL via INTRAVENOUS

## 2024-09-22 MED ORDER — METRONIDAZOLE 500 MG/100ML IV SOLN
500.0000 mg | Freq: Two times a day (BID) | INTRAVENOUS | Status: DC
  Administered 2024-09-23 (×2): 500 mg via INTRAVENOUS
  Filled 2024-09-22 (×2): qty 100

## 2024-09-22 MED ORDER — ACETAMINOPHEN 325 MG PO TABS
650.0000 mg | ORAL_TABLET | Freq: Four times a day (QID) | ORAL | Status: DC | PRN
  Administered 2024-09-26 (×2): 650 mg via ORAL
  Filled 2024-09-22 (×2): qty 2

## 2024-09-22 MED ORDER — ENOXAPARIN SODIUM 40 MG/0.4ML IJ SOSY
40.0000 mg | PREFILLED_SYRINGE | INTRAMUSCULAR | Status: DC
  Administered 2024-09-23 – 2024-09-27 (×5): 40 mg via SUBCUTANEOUS
  Filled 2024-09-22 (×5): qty 0.4

## 2024-09-22 MED ORDER — SODIUM CHLORIDE 0.9 % IV SOLN: INTRAVENOUS | Status: AC

## 2024-09-22 MED ORDER — SODIUM CHLORIDE 0.9 % IV SOLN
1.0000 g | Freq: Once | INTRAVENOUS | Status: AC
  Administered 2024-09-22: 1 g via INTRAVENOUS
  Filled 2024-09-22: qty 20

## 2024-09-22 MED ORDER — HALOPERIDOL LACTATE 5 MG/ML IJ SOLN
2.5000 mg | Freq: Once | INTRAMUSCULAR | Status: AC
  Administered 2024-09-22: 2.5 mg via INTRAVENOUS
  Filled 2024-09-22: qty 1

## 2024-09-22 MED ORDER — IOHEXOL 350 MG/ML SOLN
50.0000 mL | Freq: Once | INTRAVENOUS | Status: AC | PRN
  Administered 2024-09-22: 50 mL via INTRAVENOUS

## 2024-09-22 MED ORDER — VANCOMYCIN HCL IN DEXTROSE 1-5 GM/200ML-% IV SOLN
1000.0000 mg | INTRAVENOUS | Status: DC
  Administered 2024-09-22: 1000 mg via INTRAVENOUS
  Filled 2024-09-22: qty 200

## 2024-09-22 MED ORDER — HYDRALAZINE HCL 20 MG/ML IJ SOLN
5.0000 mg | INTRAMUSCULAR | Status: DC | PRN
  Administered 2024-09-22 – 2024-09-25 (×3): 5 mg via INTRAVENOUS
  Filled 2024-09-22 (×3): qty 1

## 2024-09-22 MED ORDER — POLYETHYLENE GLYCOL 3350 17 G PO PACK: 17.0000 g | PACK | Freq: Every day | ORAL | Status: DC | PRN

## 2024-09-22 MED ORDER — POTASSIUM CHLORIDE 10 MEQ/100ML IV SOLN
10.0000 meq | INTRAVENOUS | Status: AC
  Administered 2024-09-22: 10 meq via INTRAVENOUS
  Filled 2024-09-22: qty 100

## 2024-09-22 NOTE — Progress Notes (Signed)
   09/22/24 1947  Assess: MEWS Score  Temp (!) 101.6 F (38.7 C)  BP (!) 205/67  MAP (mmHg) 98  Pulse Rate 87  ECG Heart Rate 87  Resp 20  Level of Consciousness Responds to Pain  SpO2 97 %  Assess: MEWS Score  MEWS Temp 2  MEWS Systolic 2  MEWS Pulse 0  MEWS RR 0  MEWS LOC 2  MEWS Score 6  MEWS Score Color Red  Assess: if the MEWS score is Yellow or Red  Were vital signs accurate and taken at a resting state? Yes  Does the patient meet 2 or more of the SIRS criteria? Yes  Does the patient have a confirmed or suspected source of infection? No  MEWS guidelines implemented  Yes, red  Treat  MEWS Interventions Considered administering scheduled or prn medications/treatments as ordered  Take Vital Signs  Increase Vital Sign Frequency  Red: Q1hr x2, continue Q4hrs until patient remains green for 12hrs  Escalate  MEWS: Escalate Red: Discuss with charge nurse and notify provider. Consider notifying RRT. If remains red for 2 hours consider need for higher level of care  Notify: Charge Nurse/RN  Name of Charge Nurse/RN Notified Tim,RN  Provider Notification  Provider Name/Title V. Rathore,MD  Date Provider Notified 09/22/24  Time Provider Notified 1959  Method of Notification Page  Notification Reason Critical Result  Provider response See new orders  Date of Provider Response 09/22/24  Time of Provider Response 2011  Assess: SIRS CRITERIA  SIRS Temperature  1  SIRS Respirations  0  SIRS Pulse 0  SIRS WBC 1  SIRS Score Sum  2   Pt's PRN medication for fever has been given. Physicians order carried out.

## 2024-09-22 NOTE — Progress Notes (Signed)
 Pharmacy Antibiotic Note  Bailey [name] is a 89 y.o. female for which pharmacy has been consulted for vancomycin and aztreonam dosing for sepsis.  Patient with a history of HTN, GERD, AF, CKD, Raynaud's, anemia. Patient presenting with AMS.  SCr 0.95 WBC 19.8; LA 3.4; T 99.4; HR 82; RR 21  Plan: Aztreonam 1g q8h Metronidazole  per MD Vancomycin 1000 mg q48hr (eAUC 454.1) unless change in renal function Monitor WBC, fever, renal function, cultures De-escalate when able Levels at steady state  Height: 5' 2 (157.5 cm) Weight: 49.9 kg (110 lb) IBW/kg (Calculated) : 50.1  Temp (24hrs), Avg:98.3 F (36.8 C), Min:97.1 F (36.2 C), Max:99.4 F (37.4 C)  Recent Labs  Lab 09/22/24 1311 09/22/24 1333  WBC 19.8*  --   CREATININE 0.95  --   LATICACIDVEN  --  3.4*    Estimated Creatinine Clearance: 31.6 mL/min (by C-G formula based on SCr of 0.95 mg/dL).    Allergies  Allergen Reactions   Diamox [Acetazolamide] Nausea Only   Epinephrine-Lidocaine -Na Metabisulfite [Lidocaine -Epinephrine (Pf)] Other (See Comments)    Atrial fibrillation   Erythromycin Nausea Only   Penicillins Other (See Comments)    Unsure of reaction   Sulfonamide Derivatives Nausea Only and Other (See Comments)    Causes fever, kidney problems   Tape Rash    Sensitive to skin    Microbiology results: Pending  Thank you for allowing pharmacy to be a part of this patient's care.  Dorn Buttner, PharmD, BCPS 09/22/2024 3:09 PM ED Clinical Pharmacist -  [phone]

## 2024-09-22 NOTE — Progress Notes (Signed)
 Patient arrived in the unit, she is responding to pain, not making any sounds, seems drowsy and sleepy, has hand mittens on, CCMD notified, V/S obtained, CHG bath given, all needs met, bed alarm on, family updated. She is not following commands,  notified to the attending MD, will continue to monitor.     09/22/24 1708  Vitals  Temp 98.9 F (37.2 C)  Temp Source Oral  BP (!) 148/99  MAP (mmHg) 113  BP Location Left Arm  BP Method Automatic  Patient Position (if appropriate) Lying  Pulse Rate 88  Pulse Rate Source Monitor  ECG Heart Rate 91  Resp (!) 30  Level of Consciousness  Level of Consciousness Responds to Pain  MEWS COLOR  MEWS Score Color Red  Oxygen Therapy  SpO2 97 %  O2 Device Room Air  Pain Assessment  Pain Scale Faces  Faces Pain Scale 0  MEWS Score  MEWS Temp 0  MEWS Systolic 0  MEWS Pulse 0  MEWS RR 2  MEWS LOC 2  MEWS Score 4  Provider Notification  Provider Name/Title Seena LABOR, MD  Date Provider Notified 09/22/24  Time Provider Notified 1811  Method of Notification Page  Notification Reason Change in status  Provider response No new orders  Date of Provider Response 09/22/24  Time of Provider Response 1811

## 2024-09-22 NOTE — Progress Notes (Signed)
 Routine EEG completed, results pending Neurology review and interpretation

## 2024-09-22 NOTE — ED Notes (Signed)
 Daughter and son at bedside. EDP at bedside assessing patient.

## 2024-09-22 NOTE — ED Provider Notes (Signed)
 [facility] AT [facility] Provider Note   CSN: [id number] Arrival date & time: 09/22/24  1243     Patient presents with: Altered Mental Status   Bailey [name] is a 89 y.o. female.   HPI    89 year old female comes in with chief complaint of altered mental status. Patient resides at [facility] independent living facility.  She is accompanied here by her children.  Patient has previous history of A-fib, which she is not on any anticoagulation, CKD, Sjogren syndrome, hypertension.  Patient is not on any blood thinners.  According to the family, patient was last known normal at 7 PM yesterday.  This morning when family went to check on her around 58 AM, she was sitting on the ground, by a chair.  She was several feet away from her bed.  The bed was still made.  Patient however did have a tea cup out.  It is unclear if patient woke up this morning, made her bed and then fell or never was on the bed.  She had her pajama pants on.  Patient has had UTI in the past.  She has been having urinary frequency recently, but has not complained of pain.  In the past when she had UTI, she became confused.  Prior to Admission medications   Medication Sig Start Date End Date Taking? Authorizing Provider  aspirin 81 MG tablet Take 81 mg by mouth daily.    [provider]  Cholecalciferol (VITAMIN D ) 400 UNITS capsule Take 2 capsules by mouth daily    [provider]  cycloSPORINE  (RESTASIS ) 0.05 % ophthalmic emulsion Place 1 drop into both eyes 2 (two) times daily. 05/23/24   Katrinka Garnette KIDD, MD  escitalopram  (LEXAPRO ) 10 MG tablet Take 1 tablet (10 mg total) by mouth daily. 05/14/24   Katrinka Garnette KIDD, MD  fish oil-omega-3 fatty acids 1000 MG capsule Take one capsule by mouth  daily    [provider]  losartan  (COZAAR ) 100 MG tablet Take 1 tablet (100 mg total) by mouth daily. 05/14/24   Katrinka Garnette KIDD, MD  metoprolol  tartrate (LOPRESSOR ) 25  MG tablet Take 1 tablet (25 mg total) by mouth 2 (two) times daily. 07/23/24 10/21/24  Parthenia Olivia HERO, PA-C  Multiple Vitamins-Minerals (PRESERVISION AREDS PO) Take by mouth.    [provider]    Allergies: Diamox [acetazolamide], Epinephrine-lidocaine -na metabisulfite [lidocaine -epinephrine (pf)], Erythromycin, Penicillins, Sulfonamide derivatives, and Tape    Review of Systems  All other systems reviewed and are negative.   Updated Vital Signs BP (!) 183/104   Pulse 82   Temp 99.4 F (37.4 C) (Rectal)   Resp (!) 21   Ht 5' 2 (1.575 m)   Wt 49.9 kg   LMP  (LMP Unknown)   SpO2 96%   BMI 20.12 kg/m   Physical Exam Vitals and nursing note reviewed.  Constitutional:      Appearance: She is well-developed.     Comments: Fidgety, not responding to verbal stimuli  HENT:     Head: Atraumatic.  Eyes:     Pupils: Pupils are equal, round, and reactive to light.     Comments: 4 mm and equal pupils  Cardiovascular:     Rate and Rhythm: Normal rate.  Pulmonary:     Effort: Pulmonary effort is normal.     Breath sounds: No wheezing.  Abdominal:     Tenderness: There is no abdominal tenderness.  Musculoskeletal:  General: No deformity.     Cervical back: Neck supple.  Skin:    General: Skin is warm and dry.  Neurological:     Mental Status: She is disoriented.     Comments: Not responding to verbal stimuli, withdraws to painful stimuli Positive gag reflex     (all labs ordered are listed, but only abnormal results are displayed) Labs Reviewed  COMPREHENSIVE METABOLIC PANEL WITH GFR - Abnormal; Notable for the following components:      Result Value   Sodium 128 (*)    Potassium 3.3 (*)    Chloride 90 (*)    Glucose, Bld 167 (*)    GFR, Estimated 57 (*)    All other components within normal limits  CBC WITH DIFFERENTIAL/PLATELET - Abnormal; Notable for the following components:   WBC 19.8 (*)    Neutro Abs 17.0 (*)    Monocytes Absolute 1.4 (*)     Abs Immature Granulocytes 0.11 (*)    All other components within normal limits  URINALYSIS, ROUTINE W REFLEX MICROSCOPIC - Abnormal; Notable for the following components:   APPearance HAZY (*)    Protein, ur 100 (*)    Bacteria, UA RARE (*)    All other components within normal limits  CBG MONITORING, ED - Abnormal; Notable for the following components:   Glucose-Capillary 156 (*)    All other components within normal limits  I-STAT VENOUS BLOOD GAS, ED - Abnormal; Notable for the following components:   pCO2, Ven 37.8 (*)    pO2, Ven 58 (*)    Sodium 130 (*)    Potassium 3.3 (*)    Calcium, Ion 1.13 (*)    All other components within normal limits  I-STAT CG4 LACTIC ACID, ED - Abnormal; Notable for the following components:   Lactic Acid, Venous 3.4 (*)    All other components within normal limits  CULTURE, BLOOD (ROUTINE X 2)  CULTURE, BLOOD (ROUTINE X 2)  RESP PANEL BY RT-PCR (RSV, FLU A&B, COVID)  RVPGX2  URINE CULTURE  PROTIME-INR  CK  CBG MONITORING, ED  I-STAT CG4 LACTIC ACID, ED    EKG: None  Radiology: [facility] Chest Port 1 View Result Date: 09/22/2024 CLINICAL DATA:  Possible sepsis.  Found on ground, possible fall. EXAM: PORTABLE CHEST 1 VIEW COMPARISON:  10/27/2012. FINDINGS: The heart is enlarged the mediastinal contour is within normal limits. There is atherosclerotic calcification of the aorta. Stable pleuroparenchymal scarring is noted at the lung apices. No consolidation, effusion, or pneumothorax is seen. No acute osseous abnormality. IMPRESSION: No active disease. Electronically Signed   By: Leita Birmingham M.D.   On: 09/22/2024 14:13   CT ANGIO HEAD NECK W WO CM Result Date: 09/22/2024 EXAM: CTA HEAD AND NECK WITHOUT AND WITH 09/22/2024 01:28:02 PM TECHNIQUE: CTA of the head and neck was performed without and with the administration of 75 mL of iohexol (OMNIPAQUE) 350 MG/ML injection. Multiplanar 2D and/or 3D reformatted images are provided for review. Automated  exposure control, iterative reconstruction, and/or weight based adjustment of the mA/kV was utilized to reduce the radiation dose to as low as reasonably achievable. Stenosis of the internal carotid arteries measured using NASCET criteria. COMPARISON: None available CLINICAL HISTORY: AMS. Unwitnessed fall. Patient was found on the ground this morning. FINDINGS: CTA NECK: AORTIC ARCH AND ARCH VESSELS: No dissection or arterial injury. No significant stenosis of the brachiocephalic or subclavian arteries. CERVICAL CAROTID ARTERIES: Minimal atherosclerotic change is present at the left carotid bifurcation without significant  stenosis. No dissection or arterial injury. No hemodynamically significant stenosis by NASCET criteria. CERVICAL VERTEBRAL ARTERIES: The right vertebral artery is dominant. No dissection, arterial injury, or significant stenosis. LUNGS AND MEDIASTINUM: Unremarkable. SOFT TISSUES: No acute abnormality. BONES: Straightening of the normal cervical lordosis is present. Chronic loss of disc height and ankylosis is present at C4-C5, C5-C6 and C6-C7. Slight degenerative anterolisthesis is present at C7-T1. CTA HEAD: ANTERIOR CIRCULATION: Atherosclerotic changes are present within the cavernous internal carotid arteries bilaterally. No significant stenosis is present through the ICA termina. No significant stenosis of the anterior cerebral arteries. No significant stenosis of the middle cerebral arteries. No aneurysm. POSTERIOR CIRCULATION: Fetal type right posterior cerebral artery is noted. No significant stenosis of the posterior cerebral arteries. No significant stenosis of the basilar artery. No significant stenosis of the vertebral arteries. No aneurysm. OTHER: No dural venous sinus thrombosis on this non-dedicated study. IMPRESSION: 1. No large vessel occlusion, hemodynamically significant stenosis, or aneurysm in the head or neck. 2. Minimal atherosclerotic change at the left carotid bifurcation  without significant stenosis. 3. Atherosclerotic changes within the cavernous internal carotid arteries bilaterally without significant stenosis. Electronically signed by: Lonni Necessary MD 09/22/2024 02:07 PM EDT RP Workstation: [id number]   CT CERVICAL SPINE WO CONTRAST Result Date: 09/22/2024 CLINICAL DATA:  Provided history: Neck trauma (Age >= 65y) EXAM: CT CERVICAL SPINE WITHOUT CONTRAST TECHNIQUE: Multidetector CT imaging of the cervical spine was performed without intravenous contrast. Multiplanar CT image reconstructions were also generated. RADIATION DOSE REDUCTION: This exam was performed according to the departmental dose-optimization program which includes automated exposure control, adjustment of the mA and/or kV according to patient size and/or use of iterative reconstruction technique. COMPARISON:  None Available. FINDINGS: Alignment: Reversal of normal lordosis. No traumatic subluxation. Trace grade 1 anterolisthesis C3 on C4 and C7 on T1, likely degenerative. Skull base and vertebrae: No acute fracture. Vertebral body heights are maintained. The dens and skull base are intact. Soft tissues and spinal canal: No prevertebral fluid or swelling. No visible canal hematoma. Disc levels: Moderate diffuse degenerative disc disease. Multilevel facet hypertrophy. Facet ankylosis at C2-C3 on the left is likely degenerative. Upper chest: Biapical pleuroparenchymal scarring. Other: None. IMPRESSION: 1. No acute fracture or traumatic subluxation of the cervical spine. 2. Multilevel degenerative disc disease and facet hypertrophy. Electronically Signed   By: Andrea Gasman M.D.   On: 09/22/2024 14:01   CT HEAD WO CONTRAST Result Date: 09/22/2024 CLINICAL DATA:  Provided history: Head trauma, minor (Age >= 65y) EXAM: CT HEAD WITHOUT CONTRAST TECHNIQUE: Contiguous axial images were obtained from the base of the skull through the vertex without intravenous contrast. RADIATION DOSE REDUCTION: This exam  was performed according to the departmental dose-optimization program which includes automated exposure control, adjustment of the mA and/or kV according to patient size and/or use of iterative reconstruction technique. COMPARISON:  None Available. FINDINGS: Brain: No intracranial hemorrhage, mass effect, or midline shift. Age related atrophy. No hydrocephalus. The basilar cisterns are patent. Moderate periventricular and deep white matter hypodensity, nonspecific but typical of chronic small vessel ischemia. 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 No evidence of territorial infarct or acute ischemia. No extra-axial or intracranial fluid collection. Vascular: Atherosclerosis of skullbase vasculature without hyperdense vessel or abnormal calcification. Reference concurrently performed head CTA for vascular assessment. Skull: No fracture or focal lesion. Sinuses/Orbits: Paranasal sinuses and mastoid air cells are clear. The visualized orbits are unremarkable. Bilateral cataract resection Other: None IMPRESSION: 1. No acute intracranial abnormality. No skull fracture.  2. Age related atrophy and chronic small vessel ischemia. Electronically Signed   By: Andrea Gasman M.D.   On: 09/22/2024 13:55     .Critical Care  Performed by: Charlyn Sora, MD Authorized by: Charlyn Sora, MD   Critical care provider statement:    Critical care time (minutes):  41   Critical care was necessary to treat or prevent imminent or life-threatening deterioration of the following conditions:  CNS failure or compromise   Critical care was time spent personally by me on the following activities:  Development of treatment plan with patient or surrogate, discussions with consultants, evaluation of patient's response to treatment, examination of patient, ordering and review of laboratory studies, ordering and review of radiographic studies, ordering and performing treatments and interventions, pulse oximetry,  re-evaluation of patient's condition, review of old charts and obtaining history from patient or surrogate    Medications Ordered in the ED  0.9 %  sodium chloride  infusion ( Intravenous New Bag/Given 09/22/24 1436)  sodium chloride  0.9 % bolus 1,000 mL (1,000 mLs Intravenous New Bag/Given 09/22/24 1438)  meropenem (MERREM) 1 g in sodium chloride  0.9 % 100 mL IVPB (1 g Intravenous New Bag/Given 09/22/24 1437)  iohexol (OMNIPAQUE) 350 MG/ML injection 75 mL (75 mLs Intravenous Contrast Given 09/22/24 1337)  haloperidol lactate (HALDOL) injection 2.5 mg (2.5 mg Intravenous Given 09/22/24 1440)                                    Medical Decision Making Amount and/or Complexity of Data Reviewed Labs: ordered. Radiology: ordered.  Risk Prescription drug management. Decision regarding hospitalization.   89 year old patient with pertinent past medical history of A-fib , Raynaud's syndrome and estrogens syndrome, CKD comes in with chief complaint of acute altered mental status change.  Collateral history provided by EMS and then patient's children.  I have reviewed patient's previous records including the medications, PCP visit.  Patient has no focal neurodeficits.  She is somnolent, moving all 4 extremities, upgoing Babinski noted.  Differential diagnosis considered for this patient includes: ICH / Stroke, acute coronary syndrome, Infection - UTI/Pneumonia/soft tissue infection leading to encephalopathy, encephalopathy due to electrolyte abnormality or drug interactions or toxins or metabolic conditions like adrenal insufficiency, hyperglycemia, paraneoplastic process, hypercapnia.  Based on initial assessment, higher suspicion for acute brain bleed due to trauma.  Stroke is another possibility, possibly leading to the fall in first place.  Additionally, encephalopathy due to infection or medication side effect possible.  Plan is to get basic labs and call code medical, get CT head and CT  angiogram head and neck immediately.  Reassessment: I have independently reviewed the following labs: Patient has elevated white count of over 19.5.  She has sodium of 128.  No renal failure.  I have independently reviewed the following imaging: CT scan of the brain, no evidence of brain bleed.  I did discuss the case with neurologist, who also reviewed CT imaging for me and confirmed that there is no concerning neurologic changes.  Medicine can consult neurology if needed.  Patient was also reassessed at 3 PM.  Family updated.  Patient actually more active, but still not alert.  Appears delirious.  I will add nonviolent restraints, give her Haldol.  UA pending initially, but then returned.  UA does not show clear signs of infection.  Therefore during presentation to the medicine team, we discussed broadening her antibiotic coverage.  CK pending.   Final diagnoses:  Severe sepsis [facility])  Acute encephalopathy    ED Discharge Orders     None          Charlyn Sora, MD 09/22/24 [phone]

## 2024-09-22 NOTE — H&P (Addendum)
 History and Physical   ARIEAL CUOCO FMW:[id number] DOB: 04/10/1933 DOA: 09/22/2024  PCP: Katrinka Garnette KIDD, MD   Patient coming from: Home  Chief Complaint: Altered mental status  HPI: Bailey [name] is a 89 y.o. female with medical history significant of hypertension, GERD, A-fib, CKD 3, cervical disease, Raynaud's, anemia presenting with altered mental status.  Patient found family on the ground this morning.  She was sitting up in her living room on the floor next to a chair. Papers were scattered on the floor. Unclear if she had fallen.  At baseline patient is alert and oriented x 4 able to walk on her own.  When she was found she was grunting and not following commands.  She was last normal on the phone with her daughter yesterday at 75 PM.  Daughter did note that the patient has had increased urinary frequency recently.  Patient unable to participate in review of systems due to altered mental status.  ED Course: Vital signs in ED notable for blood pressure in the 160s-170s systolic, heart rate in the 70s-90s, respiratory rate in the teens-20s..  Lab workup included CMP with sodium 128, potassium 3.2, chloride 90, glucose 167.  CBC with leukocytosis to 13.8.  PT and INR normal.  Lactic acid elevated to 3.4, repeat pending.  CK pending.  Rester panel for flu COVID RSV pending.  Urinalysis with protein and rare bacteria only.  Blood cultures pending.  VBG with normal pH and pCO2 37.8.  Chest x-ray, CT head, CT C-spine showed no acute abnormality.  CTA head and neck showed no large vessel occlusion and no hemodynamically significant stenosis, no aneurysm.  Did note minimal left carotid atherosclerosis as well as some atherosclerosis of the cavernous internal carotid arteries without significant stenosis.  Patient initially minimally responsive but improved somewhat in the ED but remains altered and not following commands but no focal deficits appreciated.  Received Haldol and nonviolent  restraints while in the ED due to pulling at lines.  Initially was ordered meropenem while workup is ongoing.  Received 1 L IV fluids and started on a rate of fluids.  Review of Systems: Patient unable to participate in review of systems due to altered mental status.  Past Medical History:  Diagnosis Date   Allergy, unspecified not elsewhere classified    Anemia, unspecified    Benign neoplasm of colon    Cancer (HCC)    basal/sqamous/transitional melanoma   CKD (chronic kidney disease), stage III (HCC) 12/08/2017   COLONIC POLYPS 09/04/2008   Was told by Gi does not need anymore.     Diverticulosis of colon (without mention of hemorrhage)    Dry eye syndrome    Dysrhythmia    PVCs   Esophageal reflux    Hepatitis 1950s   infectious from food   History of atrial fibrillation    Hypertension    Irritable bowel syndrome    Macular degeneration    Mitral valve disorders(424.0)    Moderate aortic regurgitation 12/08/2017   Osteopenia 06/2018   T score -1.8 FRAX 10% / 2.5%   Palpitations    Premature ventricular contractions    Rheumatoid arthritis(714.0)    Sicca syndrome    Ulcer 1992   Vaginal delivery 1959, 1960, 1962, 1964    Past Surgical History:  Procedure Laterality Date   APPENDECTOMY     CATARACT EXTRACTION     CHOLECYSTECTOMY     colon tumor  1969   Benign rectal tumor excised  DILATATION & CURETTAGE/HYSTEROSCOPY WITH MYOSURE N/A 01/21/2015   Procedure: DILATATION & CURETTAGE/HYSTEROSCOPY WITH MYOSURE;  Surgeon: Evalene SHAUNNA Organ, MD;  Location: [facility];  Service: Gynecology;  Laterality: N/A;   eye injection Left    HIATAL HERNIA REPAIR     REFRACTIVE SURGERY     ROTATOR CUFF REPAIR  2009   right by Dr. Shari   SKIN BIOPSY  11/2020   squamous cell removed  2013   Dr. Dwane --removed from her right cheek   TUBAL LIGATION      Social History  reports that she quit smoking about 60 years ago. Her smoking use included cigarettes. She started smoking  about 69 years ago. She has a 9 pack-year smoking history. She has been exposed to tobacco smoke. She has never used smokeless tobacco. She reports current alcohol use of about 7.0 standard drinks of alcohol per week. She reports that she does not use drugs.  Allergies  Allergen Reactions   Diamox [Acetazolamide] Nausea Only   Epinephrine-Lidocaine -Na Metabisulfite [Lidocaine -Epinephrine (Pf)] Other (See Comments)    Atrial fibrillation   Erythromycin Nausea Only   Penicillins Other (See Comments)    Unsure of reaction   Sulfonamide Derivatives Nausea Only and Other (See Comments)    Causes fever, kidney problems   Tape Rash    Sensitive to skin    Family History  Problem Relation Age of Onset   Stroke Mother    Hypertension Mother    Ulcerative colitis Father    Heart attack Father    Breast cancer Maternal Grandmother        Age 55's   Uterine cancer Maternal Grandmother    Hypertension Maternal Grandfather    Colon cancer Paternal Grandmother   Reviewed on admission  Prior to Admission medications   Medication Sig Start Date End Date Taking? Authorizing Provider  aspirin 81 MG tablet Take 81 mg by mouth daily.    [provider]  Cholecalciferol (VITAMIN D ) 400 UNITS capsule Take 2 capsules by mouth daily    [provider]  cycloSPORINE  (RESTASIS ) 0.05 % ophthalmic emulsion Place 1 drop into both eyes 2 (two) times daily. 05/23/24   Katrinka Garnette KIDD, MD  escitalopram  (LEXAPRO ) 10 MG tablet Take 1 tablet (10 mg total) by mouth daily. 05/14/24   Katrinka Garnette KIDD, MD  fish oil-omega-3 fatty acids 1000 MG capsule Take one capsule by mouth  daily    [provider]  losartan  (COZAAR ) 100 MG tablet Take 1 tablet (100 mg total) by mouth daily. 05/14/24   Katrinka Garnette KIDD, MD  metoprolol  tartrate (LOPRESSOR ) 25 MG tablet Take 1 tablet (25 mg total) by mouth 2 (two) times daily. 07/23/24 10/21/24  Parthenia Olivia HERO, PA-C  Multiple Vitamins-Minerals  (PRESERVISION AREDS PO) Take by mouth.    [provider]    Physical Exam: Vitals:   09/22/24 1258 09/22/24 1318 09/22/24 1400 09/22/24 1500  BP: (!) 176/127  (!) 162/108 (!) 183/104  Pulse: 75  88 82  Resp: 17  18 (!) 21  Temp: (!) 97.1 F (36.2 C) 99.4 F (37.4 C)    TempSrc: Axillary Rectal    SpO2: 100%  100% 96%  Weight:      Height:        Physical Exam Constitutional:      General: She is not in acute distress.    Appearance: Normal appearance.  HENT:     Head: Normocephalic and atraumatic.     Mouth/Throat:  Mouth: Mucous membranes are moist.     Pharynx: Oropharynx is clear.  Eyes:     Extraocular Movements: Extraocular movements intact.     Pupils: Pupils are equal, round, and reactive to light.  Cardiovascular:     Rate and Rhythm: Normal rate and regular rhythm.     Pulses: Normal pulses.     Heart sounds: Normal heart sounds.  Pulmonary:     Effort: Pulmonary effort is normal. No respiratory distress.     Breath sounds: Normal breath sounds.  Abdominal:     General: Bowel sounds are normal. There is no distension.     Palpations: Abdomen is soft.     Tenderness: There is abdominal tenderness.  Musculoskeletal:        General: No swelling or deformity.     Cervical back: No rigidity.  Skin:    General: Skin is warm and dry.  Neurological:     Comments: Alert (eye open), orients to pain only. Some sounds but no words made, not following commands. Looking aourns some and moving her extremities; But responsive to pain only.     Labs on Admission: I have personally reviewed following labs and imaging studies  CBC: Recent Labs  Lab 09/22/24 1311 09/22/24 1332  WBC 19.8*  --   NEUTROABS 17.0*  --   HGB 12.9 13.9  HCT 38.3 41.0  MCV 89.9  --   PLT 346  --     Basic Metabolic Panel: Recent Labs  Lab 09/22/24 1311 09/22/24 1332  NA 128* 130*  K 3.3* 3.3*  CL 90*  --   CO2 23  --   GLUCOSE 167*  --   BUN 13  --   CREATININE  0.95  --   CALCIUM 9.0  --     GFR: Estimated Creatinine Clearance: 31.6 mL/min (by C-G formula based on SCr of 0.95 mg/dL).  Liver Function Tests: Recent Labs  Lab 09/22/24 1311  AST 40  ALT 34  ALKPHOS 81  BILITOT 1.2  PROT 7.7  ALBUMIN 4.0    Urine analysis:    Component Value Date/Time   COLORURINE YELLOW 09/22/2024 1311   APPEARANCEUR HAZY (A) 09/22/2024 1311   LABSPEC 1.013 09/22/2024 1311   PHURINE 7.0 09/22/2024 1311   GLUCOSEU NEGATIVE 09/22/2024 1311   GLUCOSEU NEGATIVE 09/27/2011 0824   HGBUR NEGATIVE 09/22/2024 1311   BILIRUBINUR NEGATIVE 09/22/2024 1311   KETONESUR NEGATIVE 09/22/2024 1311   PROTEINUR 100 (A) 09/22/2024 1311   UROBILINOGEN 0.2 12/18/2014 1134   NITRITE NEGATIVE 09/22/2024 1311   LEUKOCYTESUR NEGATIVE 09/22/2024 1311    Radiological Exams on Admission: DG Chest Port 1 View Result Date: 09/22/2024 CLINICAL DATA:  Possible sepsis.  Found on ground, possible fall. EXAM: PORTABLE CHEST 1 VIEW COMPARISON:  10/27/2012. FINDINGS: The heart is enlarged the mediastinal contour is within normal limits. There is atherosclerotic calcification of the aorta. Stable pleuroparenchymal scarring is noted at the lung apices. No consolidation, effusion, or pneumothorax is seen. No acute osseous abnormality. IMPRESSION: No active disease. Electronically Signed   By: Leita Birmingham M.D.   On: 09/22/2024 14:13   CT ANGIO HEAD NECK W WO CM Result Date: 09/22/2024 EXAM: CTA HEAD AND NECK WITHOUT AND WITH 09/22/2024 01:28:02 PM TECHNIQUE: CTA of the head and neck was performed without and with the administration of 75 mL of iohexol (OMNIPAQUE) 350 MG/ML injection. Multiplanar 2D and/or 3D reformatted images are provided for review. Automated exposure control, iterative reconstruction, and/or weight based adjustment  of the mA/kV was utilized to reduce the radiation dose to as low as reasonably achievable. Stenosis of the internal carotid arteries measured using NASCET  criteria. COMPARISON: None available CLINICAL HISTORY: AMS. Unwitnessed fall. Patient was found on the ground this morning. FINDINGS: CTA NECK: AORTIC ARCH AND ARCH VESSELS: No dissection or arterial injury. No significant stenosis of the brachiocephalic or subclavian arteries. CERVICAL CAROTID ARTERIES: Minimal atherosclerotic change is present at the left carotid bifurcation without significant stenosis. No dissection or arterial injury. No hemodynamically significant stenosis by NASCET criteria. CERVICAL VERTEBRAL ARTERIES: The right vertebral artery is dominant. No dissection, arterial injury, or significant stenosis. LUNGS AND MEDIASTINUM: Unremarkable. SOFT TISSUES: No acute abnormality. BONES: Straightening of the normal cervical lordosis is present. Chronic loss of disc height and ankylosis is present at C4-C5, C5-C6 and C6-C7. Slight degenerative anterolisthesis is present at C7-T1. CTA HEAD: ANTERIOR CIRCULATION: Atherosclerotic changes are present within the cavernous internal carotid arteries bilaterally. No significant stenosis is present through the ICA termina. No significant stenosis of the anterior cerebral arteries. No significant stenosis of the middle cerebral arteries. No aneurysm. POSTERIOR CIRCULATION: Fetal type right posterior cerebral artery is noted. No significant stenosis of the posterior cerebral arteries. No significant stenosis of the basilar artery. No significant stenosis of the vertebral arteries. No aneurysm. OTHER: No dural venous sinus thrombosis on this non-dedicated study. IMPRESSION: 1. No large vessel occlusion, hemodynamically significant stenosis, or aneurysm in the head or neck. 2. Minimal atherosclerotic change at the left carotid bifurcation without significant stenosis. 3. Atherosclerotic changes within the cavernous internal carotid arteries bilaterally without significant stenosis. Electronically signed by: Lonni Necessary MD 09/22/2024 02:07 PM EDT RP  Workstation: [id number]   CT CERVICAL SPINE WO CONTRAST Result Date: 09/22/2024 CLINICAL DATA:  Provided history: Neck trauma (Age >= 65y) EXAM: CT CERVICAL SPINE WITHOUT CONTRAST TECHNIQUE: Multidetector CT imaging of the cervical spine was performed without intravenous contrast. Multiplanar CT image reconstructions were also generated. RADIATION DOSE REDUCTION: This exam was performed according to the departmental dose-optimization program which includes automated exposure control, adjustment of the mA and/or kV according to patient size and/or use of iterative reconstruction technique. COMPARISON:  None Available. FINDINGS: Alignment: Reversal of normal lordosis. No traumatic subluxation. Trace grade 1 anterolisthesis C3 on C4 and C7 on T1, likely degenerative. Skull base and vertebrae: No acute fracture. Vertebral body heights are maintained. The dens and skull base are intact. Soft tissues and spinal canal: No prevertebral fluid or swelling. No visible canal hematoma. Disc levels: Moderate diffuse degenerative disc disease. Multilevel facet hypertrophy. Facet ankylosis at C2-C3 on the left is likely degenerative. Upper chest: Biapical pleuroparenchymal scarring. Other: None. IMPRESSION: 1. No acute fracture or traumatic subluxation of the cervical spine. 2. Multilevel degenerative disc disease and facet hypertrophy. Electronically Signed   By: Andrea Gasman M.D.   On: 09/22/2024 14:01   CT HEAD WO CONTRAST Result Date: 09/22/2024 CLINICAL DATA:  Provided history: Head trauma, minor (Age >= 65y) EXAM: CT HEAD WITHOUT CONTRAST TECHNIQUE: Contiguous axial images were obtained from the base of the skull through the vertex without intravenous contrast. RADIATION DOSE REDUCTION: This exam was performed according to the departmental dose-optimization program which includes automated exposure control, adjustment of the mA and/or kV according to patient size and/or use of iterative reconstruction technique.  COMPARISON:  None Available. FINDINGS: Brain: No intracranial hemorrhage, mass effect, or midline shift. Age related atrophy. No hydrocephalus. The basilar cisterns are patent. Moderate periventricular and deep white matter hypodensity, nonspecific but  typical of chronic small vessel ischemia. 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 No evidence of territorial infarct or acute ischemia. No extra-axial or intracranial fluid collection. Vascular: Atherosclerosis of skullbase vasculature without hyperdense vessel or abnormal calcification. Reference concurrently performed head CTA for vascular assessment. Skull: No fracture or focal lesion. Sinuses/Orbits: Paranasal sinuses and mastoid air cells are clear. The visualized orbits are unremarkable. Bilateral cataract resection Other: None IMPRESSION: 1. No acute intracranial abnormality. No skull fracture. 2. Age related atrophy and chronic small vessel ischemia. Electronically Signed   By: Andrea Gasman M.D.   On: 09/22/2024 13:55   EKG: Independently reviewed.  Sinus rhythm at 76 beats minute.  Nonspecific T wave changes.  Assessment/Plan Principal Problem:   SIRS (systemic inflammatory response syndrome) (HCC) Active Problems:   Paroxysmal atrial fibrillation (HCC)   GERD   Raynauds syndrome   HTN (hypertension)   Anemia   Sjogren's syndrome with keratoconjunctivitis sicca   CKD (chronic kidney disease), stage III (HCC)   Moderate aortic regurgitation   Acute encephalopathy   SIRS Acute encephalopathy Unclear source of infection > Patient presenting with acute encephalopathy.  At baseline is alert and oriented x 4 at this time is not following commands and is not speaking in full sentences. > Found down on the ground by her family.  Unclear if she had a fall.  Initial workup in the ED concerning for SIRS/sepsis of unclear etiology.  Noted to have intermittent respiratory rate above 20 and intermittent heart rate above 90 with significant  leukocytosis to 19.8. > No fevers thus far but has leukocytosis to 19.8.  Urinalysis with protein and rare bacteria only.  Chest x-ray with no acute abnormality.  Blood cultures and urine cultures pending.  Lactic acid elevated to 3.4. > Other workup included CMP with mild hyponatremia 128 and potassium 3.2.  CT head, CT C-spine and CTA head and neck showed no significant acute abnormalities. > Patient initially somewhat unresponsive but has improved in the ED but remains altered.  No focal neurologic deficits appreciated.  Patient not following commands. > Initially received meropenem in the ED.  Received 1 L of IV fluids followed by 150 cc an hour given blood pressures actually elevated at this time. - Monitor in progressive unit overnight - Transition antibiotics to vancomycin, aztreonam, Flagyl  in the setting of SIRS/sepsis of unknown source and penicillin allergy continue with IV fluids - Trend lactic acid - Follow-up urine culture, blood culture - Trend fever curve and WBC  - CT abdomen pelvis to further investigate for source given abdomen is tender to palpation about the umbilicus - EEG  Hypertension - Can continue metoprolol  - Will hold losartan  for now pending blood pressure response to evolving infection  Sjogren's disease Raynaud's disease Sicca symptoms - Continue home eyedrops  History of A-fib - Continue metoprolol  - Not on anticoagulation  History of of CKD 3 - Creatinine stable - Trend renal function and electrolytes  DVT prophylaxis: Lovenox Code Status:   Full Family Communication:  Updated at bedside  Disposition Plan:   Patient is from:  Home/ [facility]  Anticipated DC to:  Same as above  Anticipated DC date:  2-5 days  Anticipated DC barriers: None  Consults called:  None Admission status:  Inpatient, progressive  Severity of Illness: The appropriate patient status for this patient is INPATIENT. Inpatient status is judged to be  reasonable and necessary in order to provide the required intensity of service to ensure the patient's safety.  The patient's presenting symptoms, physical exam findings, and initial radiographic and laboratory data in the context of their chronic comorbidities is felt to place them at high risk for further clinical deterioration. Furthermore, it is not anticipated that the patient will be medically stable for discharge from the hospital within 2 midnights of admission.   * I certify that at the point of admission it is my clinical judgment that the patient will require inpatient hospital care spanning beyond 2 midnights from the point of admission due to high intensity of service, high risk for further deterioration and high frequency of surveillance required.DEWAINE Marsa KATHEE Seena MD [facility]  How to contact the [facility] Attending or Consulting provider 7A - 7P or covering provider during after hours 7P -7A, for this patient?   Check the care team in [facility] and look for a) attending/consulting [facility] provider listed and b) the [facility] team listed Log into [email or web address] and use [place]'s universal password to access. If you do not have the password, please contact the hospital operator. Locate the [facility] provider you are looking for under [facility] and page to a number that you can be directly reached. If you still have difficulty reaching the provider, please page the [facility] (Director on Call) for the Hospitalists listed on amion for assistance.  09/22/2024, 3:20 PM

## 2024-09-22 NOTE — ED Triage Notes (Signed)
 Per [facility] pt coming from [facility] for family finding her on ground this morning. States patient was sitting up so unsure if she fell. Patient is usually A&0x4 and ambulatory at baseline however has only been grunting and not following commands. Patient spoke with her daughter on the phone at 7pm last night and daughter states she was speaking in full sentences. Patient very restless and trying to pull everything off. No speech.

## 2024-09-22 NOTE — Progress Notes (Signed)
 Elink following for sepsis protocol.

## 2024-09-23 DIAGNOSIS — R6510 Systemic inflammatory response syndrome (SIRS) of non-infectious origin without acute organ dysfunction: Secondary | ICD-10-CM | POA: Diagnosis not present

## 2024-09-23 LAB — CBC
HCT: 32.7 % — ABNORMAL LOW (ref 36.0–46.0)
Hemoglobin: 11.2 g/dL — ABNORMAL LOW (ref 12.0–15.0)
MCH: 30.6 pg (ref 26.0–34.0)
MCHC: 34.3 g/dL (ref 30.0–36.0)
MCV: 89.3 fL (ref 80.0–100.0)
Platelets: 274 K/uL (ref 150–400)
RBC: 3.66 MIL/uL — ABNORMAL LOW (ref 3.87–5.11)
RDW: 13.3 % (ref 11.5–15.5)
WBC: 21.7 K/uL — ABNORMAL HIGH (ref 4.0–10.5)
nRBC: 0 % (ref 0.0–0.2)

## 2024-09-23 LAB — COMPREHENSIVE METABOLIC PANEL WITH GFR
ALT: 36 U/L (ref 0–44)
AST: 46 U/L — ABNORMAL HIGH (ref 15–41)
Albumin: 3.2 g/dL — ABNORMAL LOW (ref 3.5–5.0)
Alkaline Phosphatase: 73 U/L (ref 38–126)
Anion gap: 11 (ref 5–15)
BUN: 10 mg/dL (ref 8–23)
CO2: 22 mmol/L (ref 22–32)
Calcium: 8.4 mg/dL — ABNORMAL LOW (ref 8.9–10.3)
Chloride: 99 mmol/L (ref 98–111)
Creatinine, Ser: 0.91 mg/dL (ref 0.44–1.00)
GFR, Estimated: >60 mL/min (ref >60)
Glucose, Bld: 124 mg/dL — ABNORMAL HIGH (ref 70–99)
Potassium: 3.2 mmol/L — ABNORMAL LOW (ref 3.5–5.1)
Sodium: 132 mmol/L — ABNORMAL LOW (ref 135–145)
Total Bilirubin: 1.1 mg/dL (ref 0.0–1.2)
Total Protein: 6.1 g/dL — ABNORMAL LOW (ref 6.5–8.1)

## 2024-09-23 LAB — MAGNESIUM: Magnesium: 1.9 mg/dL (ref 1.7–2.4)

## 2024-09-23 LAB — PROTIME-INR
INR: 1.2 (ref 0.8–1.2)
Prothrombin Time: 16.3 s — ABNORMAL HIGH (ref 11.4–15.2)

## 2024-09-23 LAB — CORTISOL-AM, BLOOD: Cortisol - AM: 24 ug/dL — ABNORMAL HIGH (ref 6.7–22.6)

## 2024-09-23 MED ORDER — MAGNESIUM SULFATE IN D5W 1-5 GM/100ML-% IV SOLN
1.0000 g | Freq: Once | INTRAVENOUS | Status: AC
  Administered 2024-09-23: 1 g via INTRAVENOUS
  Filled 2024-09-23: qty 100

## 2024-09-23 NOTE — Progress Notes (Signed)
 Progress Note   Patient: Bailey [name] FMW:[id number] DOB: 12/31/1932 DOA: 09/22/2024     1 DOS: the patient was seen and examined on 09/23/2024   Brief hospital course: The patient is a 89 yr old woman who was brought to the ED by her family with complaints of fall and altered mental status. Upon presentation the patient was grunting and not able to follow commands. The patient was last normal the previous day on the phone at 7 pm.  The daughter noted that the patient had had increased urine frequency lately.   She has a past medical history significant for hypertension, GERD, Atrial fibrillation, CKD 3, Raynaud's, and anemia.   In th ED the patient was found to have temp of 101.6, tachypnea with RR of 24, HR of 92, and a drop in BP from 185/72 to 159/51 with a WBC count of 19.8 meeting criteria for sepsis. She received a 1 L bolus of NS and then 150 cc/hr of NS. Urinalysis was negative, but urine culture has grown out 100K CFU of e coli. CTA of the head and neck with and without contrast was obtained and was negative for acute abnormalities. There is age related atrophy and chronic small vessel ischemia. Lactic acid was 3.4 initially, but did come down to 2.1. CT abdomen and pelvis demonstrates a cyst like structure in the patient's right groin. No signs of acute diverticulitis cholecystitis or cholelithiasis on CT abdomen and pelvis.  The patient was admitted to a medical bed. Blood cultures were obtained, but have had no growth.   On 09/23/2024 the patient has improved sensorium. She is oriented to place, but not year or date. Her potassium is again down to 3.2 after supplementation. The patient is receiving IV Azactam. Family is at bedside.  Assessment and Plan: Principal Problem:   SIRS (systemic inflammatory response syndrome) (HCC) Active Problems:   Paroxysmal atrial fibrillation (HCC)   GERD   Raynauds syndrome   HTN (hypertension)   Anemia   Sjogren's syndrome with  keratoconjunctivitis sicca   CKD (chronic kidney disease), stage III (HCC)   Moderate aortic regurgitation   Acute encephalopathy   Sepsis: Resolving. The patient presented meeting sepsis criteria. Source of sepsis being UTI. She has received over 2L and has resolving criteria except that she continues to have an elevated WBC. Sepsis is likely due to UTI. Urine culture has grown out E. Coli. She is receiving Azactam.  Metabolic encephalopathy due to sepsis.  -Encephalopathy is clearing. Pt is verbal and she is oriented to self and place only. Not oriented to time.   - CTA head and neck negative for acute pathology. There is evidence of age related atrophy and chronic small vessel ischemia. - EEG was performed and was negative for signs of seizure. It had diffuse slowly consistent with acute encephalopathy.   Hypertension - Can continue metoprolol  - Consider losartan .   Sjogren's disease Raynaud's disease Sicca symptoms - Continue home eyedrops   History of A-fib - Continue metoprolol  - Not on anticoagulation   History of of CKD 3 - Creatinine stable at 0.85.  - Trend renal function and electrolytes   DVT prophylaxis:      Lovenox Code Status:              Full Family Communication:       Updated at bedside    Subjective: The patient is resting comfortably. Family at bedside.   Physical Exam: Vitals:   09/23/24 0031 09/23/24 0419 09/23/24  9185 09/23/24 1613  BP: (!) 179/65 (!) 166/76 (!) 156/66 (!) 177/64  Pulse: 75 75 83 68  Resp: 20 20 (!) 24 (!) 22  Temp: 98.4 F (36.9 C) 98.4 F (36.9 C) 97.9 F (36.6 C) 98.1 F (36.7 C)  TempSrc: Oral Oral Oral Oral  SpO2: 96% 96% 98% 95%  Weight:      Height:       Exam:  Constitutional:  The patient is awake, alert, and oriented x 2. No acute distress. Eyes:  pupils and irises appear normal Normal lids and conjunctivae Respiratory:  No increased work of breathing. No wheezes, rales, or rhonchi No tactile  fremitus Cardiovascular:  Regular rate and rhythm No murmurs, ectopy, or gallups. No lateral PMI. No thrills. Abdomen:  Abdomen is soft, non-tender, non-distended No hernias, masses, or organomegaly Normoactive bowel sounds.  Musculoskeletal:  No cyanosis, clubbing, or edema Skin:  No rashes, lesions, ulcers palpation of skin: no induration or nodules Neurologic:  CN 2-12 intact Sensation all 4 extremities intact Psychiatric:  The patient is unable to cooperate with exam.  Data Reviewed:  CBC, BMP  Family Communication: Family is at bedside.  Disposition: Status is: Inpatient Remains inpatient appropriate because: Awaiting susceptibilities. Continue IV antibiotics.  Planned Discharge Destination: Home vs SNF pending PT/OT.    Time spent: 38 minutes  Author: Daoud Lobue, DO 09/23/2024 7:06 PM  For on call review [email or web address].

## 2024-09-23 NOTE — Plan of Care (Signed)
  Problem: Safety: Goal: Non-violent Restraint(s) Outcome: Progressing   Problem: Fluid Volume: Goal: Hemodynamic stability will improve Outcome: Progressing   Problem: Clinical Measurements: Goal: Diagnostic test results will improve Outcome: Progressing Goal: Signs and symptoms of infection will decrease Outcome: Progressing   Problem: Clinical Measurements: Goal: Ability to maintain clinical measurements within normal limits will improve Outcome: Progressing Goal: Will remain free from infection Outcome: Progressing Goal: Diagnostic test results will improve Outcome: Progressing   Problem: Activity: Goal: Risk for activity intolerance will decrease Outcome: Progressing

## 2024-09-23 NOTE — Progress Notes (Signed)
 PHARMACY - PHYSICIAN COMMUNICATION CRITICAL VALUE ALERT - BLOOD CULTURE IDENTIFICATION (BCID)  Bailey [name] is an 89 y.o. female who presented to [facility] on 09/22/2024 with a chief complaint of AMS.  Assessment:  Blood cultures positive 1/4 aerobic bottles GPRs. Per micro, will not run BCID on GPRs as they are likely a contaminant. Urine culture returned >100k CFU E. Coli, susceptibilities pending.  Name of physician (or Provider) Contacted: Dr. Brigida Bureau   Current antibiotics: Aztreonam, vanc, flagyl    Changes to prescribed antibiotics recommended:  Recommend to de-escalate to aztreonam monotherapy. Per history, patient has not received penicillins or cephalosporins in the past. Follow-up on allergy.    Response not received from provider;  current antibiotics are likely to cover the isolated organism.  Consider de-escalation soon.  No results found for this or any previous visit.  Bailey [name] 09/23/2024  3:08 PM

## 2024-09-23 NOTE — Procedures (Signed)
 Routine EEG Report  Bailey [name] is a 89 y.o. female with a history of altered mental status who is undergoing an EEG to evaluate for seizures.  Report: This EEG was acquired with electrodes placed according to the International 10-20 electrode system (including Fp1, Fp2, F3, F4, C3, C4, P3, P4, O1, O2, T3, T4, T5, T6, A1, A2, Fz, Cz, Pz). The following electrodes were missing or displaced: none.  The occipital dominant rhythm was 7 Hz with intermittent more pronounced diffuse slowing. This activity is reactive to stimulation. Drowsiness was manifested by background fragmentation; deeper stages of sleep were identified by K complexes and sleep spindles. There was no focal slowing. There were no interictal epileptiform discharges. There were no electrographic seizures identified. There was no abnormal response to photic stimulation or hyperventilation.   Impression and clinical correlation: This EEG was obtained while awake and asleep and is abnormal due to mild-to-moderate diffuse slowing indicative of global cerebral dysfunction. Epileptiform abnormalities were not seen during this recording.  Elida Ross, MD [facility] [phone]  If 7pm- 7am, please page neurology on call as listed in [facility]ION.

## 2024-09-23 NOTE — Progress Notes (Signed)
 Pt is lethargic and not alert enough for swallow test.

## 2024-09-23 NOTE — Progress Notes (Signed)
 During bedside report patient was able to respond to her name with a verbal response. Pt was able to make her daughter out verbally but couldn't tell where she is. Daughter is by bedside.

## 2024-09-24 ENCOUNTER — Telehealth: Payer: Self-pay | Admitting: Family Medicine

## 2024-09-24 DIAGNOSIS — R6510 Systemic inflammatory response syndrome (SIRS) of non-infectious origin without acute organ dysfunction: Secondary | ICD-10-CM | POA: Diagnosis not present

## 2024-09-24 LAB — CBC WITH DIFFERENTIAL/PLATELET
Abs Immature Granulocytes: 0.04 K/uL (ref 0.00–0.07)
Basophils Absolute: 0 K/uL (ref 0.0–0.1)
Basophils Relative: 0 %
Eosinophils Absolute: 0 K/uL (ref 0.0–0.5)
Eosinophils Relative: 0 %
HCT: 28 % — ABNORMAL LOW (ref 36.0–46.0)
Hemoglobin: 9.5 g/dL — ABNORMAL LOW (ref 12.0–15.0)
Immature Granulocytes: 0 %
Lymphocytes Relative: 14 %
Lymphs Abs: 2 K/uL (ref 0.7–4.0)
MCH: 30.5 pg (ref 26.0–34.0)
MCHC: 33.9 g/dL (ref 30.0–36.0)
MCV: 90 fL (ref 80.0–100.0)
Monocytes Absolute: 1.6 K/uL — ABNORMAL HIGH (ref 0.1–1.0)
Monocytes Relative: 12 %
Neutro Abs: 10.3 K/uL — ABNORMAL HIGH (ref 1.7–7.7)
Neutrophils Relative %: 74 %
Platelets: 220 K/uL (ref 150–400)
RBC: 3.11 MIL/uL — ABNORMAL LOW (ref 3.87–5.11)
RDW: 13.2 % (ref 11.5–15.5)
WBC: 14 K/uL — ABNORMAL HIGH (ref 4.0–10.5)
nRBC: 0 % (ref 0.0–0.2)

## 2024-09-24 LAB — CULTURE, BLOOD (ROUTINE X 2)

## 2024-09-24 LAB — URINE CULTURE: Culture: >=100000 — AB

## 2024-09-24 LAB — BASIC METABOLIC PANEL WITH GFR
Anion gap: 7 (ref 5–15)
BUN: 11 mg/dL (ref 8–23)
CO2: 19 mmol/L — ABNORMAL LOW (ref 22–32)
Calcium: 7.8 mg/dL — ABNORMAL LOW (ref 8.9–10.3)
Chloride: 103 mmol/L (ref 98–111)
Creatinine, Ser: 0.85 mg/dL (ref 0.44–1.00)
GFR, Estimated: >60 mL/min (ref >60)
Glucose, Bld: 100 mg/dL — ABNORMAL HIGH (ref 70–99)
Potassium: 2.9 mmol/L — ABNORMAL LOW (ref 3.5–5.1)
Sodium: 129 mmol/L — ABNORMAL LOW (ref 135–145)

## 2024-09-24 MED ORDER — POTASSIUM CHLORIDE 10 MEQ/100ML IV SOLN
10.0000 meq | INTRAVENOUS | Status: AC
  Administered 2024-09-24 (×4): 10 meq via INTRAVENOUS
  Filled 2024-09-24 (×4): qty 100

## 2024-09-24 MED ORDER — METOPROLOL TARTRATE 12.5 MG HALF TABLET
12.5000 mg | ORAL_TABLET | Freq: Two times a day (BID) | ORAL | Status: DC
Start: 2024-09-24 — End: 2024-09-25
  Administered 2024-09-24 – 2024-09-25 (×2): 12.5 mg via ORAL
  Filled 2024-09-24 (×2): qty 1

## 2024-09-24 NOTE — Progress Notes (Signed)
 Progress Note   Patient: Bailey [name] FMW:[id number] DOB: 28-Mar-1933 DOA: 09/22/2024     2 DOS: the patient was seen and examined on 09/24/2024   Brief hospital course: The patient is a 89 yr old woman who was brought to the ED by her family with complaints of fall and altered mental status. Upon presentation the patient was grunting and not able to follow commands. The patient was last normal the previous day on the phone at 7 pm.  The daughter noted that the patient had had increased urine frequency lately.   She has a past medical history significant for hypertension, GERD, Atrial fibrillation, CKD 3, Raynaud's, and anemia.   In th ED the patient was found to have temp of 101.6, tachypnea with RR of 24, HR of 92, and a drop in BP from 185/72 to 159/51 with a WBC count of 19.8 meeting criteria for sepsis. She received a 1 L bolus of NS and then 150 cc/hr of NS. Urinalysis was negative, but urine culture has grown out 100K CFU of e coli. CTA of the head and neck with and without contrast was obtained and was negative for acute abnormalities. There is age related atrophy and chronic small vessel ischemia. Lactic acid was 3.4 initially, but did come down to 2.1. CT abdomen and pelvis demonstrates a cyst like structure in the patient's right groin. No signs of acute diverticulitis cholecystitis or cholelithiasis on CT abdomen and pelvis.  The patient was admitted to a medical bed. Blood cultures were obtained, but have had no growth.   On 09/23/2024 the patient has improved sensorium. She is oriented to place, but not year or date. Her potassium is again down to 3.2 after supplementation. The patient is receiving IV Azactam. Family is at bedside.  On 09/23/2024 2/2 blood cultures were positive for gram + rods. No susceptibilities will be performed. This is thought to be contaminate, but did come from both bottles. Will repeat cultures. Urine cultures have grown out E. Coli.  On 09/24/2024, the  patient is sitting up at bedside. She states that she is feeling better. Daughter is at bedside. Susceptibilities for E. Coli demonstrate sentifivity to azactam. Will continue current antibiotics.   Assessment and Plan: Principal Problem:   SIRS (systemic inflammatory response syndrome) (HCC) Active Problems:   Paroxysmal atrial fibrillation (HCC)   GERD   Raynauds syndrome   HTN (hypertension)   Anemia   Sjogren's syndrome with keratoconjunctivitis sicca   CKD (chronic kidney disease), stage III (HCC)   Moderate aortic regurgitation   Acute encephalopathy   Sepsis: Resolved. The patient presented meeting sepsis criteria. Source of sepsis being UTI. She has received over 2L and has resolving criteria except that she continues to have an elevated WBC. Sepsis is likely due to UTI. Urine culture has grown out E. Coli. She is receiving Azactam to which the species is sensitive.   UTI Urine culture has grown out E. Coli which is found to be sensitive to azactam. Consider change to oral medication on 09/25/2204.   Metabolic encephalopathy due to sepsis.  -Encephalopathy continues to clear. Pt is verbal and she is oriented to self and place, and time..   - CTA head and neck negative for acute pathology. There is evidence of age related atrophy and chronic small vessel ischemia. - EEG was performed and was negative for signs of seizure. It had diffuse slowly consistent with acute encephalopathy.  Weakness: PT/OT to evaluate and treat.   Hypertension -  Can continue metoprolol  - Consider losartan .   Sjogren's disease Raynaud's disease Sicca symptoms - Continue home eyedrops   History of A-fib - Continue metoprolol  - Not on anticoagulation   History of of CKD 3 - Creatinine stable at 0.85.  - Trend renal function and electrolytes   DVT prophylaxis:      Lovenox Code Status:              Full Family Communication:       Updated at bedside    Subjective: The patient is resting  comfortably. Family at bedside.   Physical Exam: Vitals:   09/23/24 2348 09/24/24 0518 09/24/24 0837 09/24/24 1659  BP: (!) 151/62 (!) 179/61 (!) 163/66 (!) 199/57  Pulse: 66 63 76 70  Resp: 20 (!) 23 20 20   Temp: 98.4 F (36.9 C) 98.5 F (36.9 C) 97.6 F (36.4 C) 98.1 F (36.7 C)  TempSrc: Oral Oral Axillary Oral  SpO2: 96% 96% 96% 96%  Weight:      Height:       Exam:  Constitutional:  The patient is awake, alert, and oriented x 2. No acute distress. Eyes:  pupils and irises appear normal Normal lids and conjunctivae Respiratory:  No increased work of breathing. No wheezes, rales, or rhonchi No tactile fremitus Cardiovascular:  Regular rate and rhythm No murmurs, ectopy, or gallups. No lateral PMI. No thrills. Abdomen:  Abdomen is soft, non-tender, non-distended No hernias, masses, or organomegaly Normoactive bowel sounds.  Musculoskeletal:  No cyanosis, clubbing, or edema Skin:  No rashes, lesions, ulcers palpation of skin: no induration or nodules Neurologic:  CN 2-12 intact Sensation all 4 extremities intact Psychiatric:  The patient is unable to cooperate with exam.  Data Reviewed:  CBC, BMP  Family Communication: Family is at bedside.  Disposition: Status is: Inpatient Remains inpatient appropriate because: Awaiting susceptibilities. Continue IV antibiotics.  Planned Discharge Destination: Home vs SNF pending PT/OT.    Time spent: 36 minutes  Author: Jazelyn Sipe, DO 09/24/2024 7:01 PM  For on call review [email or web address].

## 2024-09-24 NOTE — Evaluation (Signed)
 Occupational Therapy Evaluation Patient Details Name: Bailey [name] MRN: [id number] DOB: [birth year] Today's Date: 09/24/2024   History of Present Illness   Bailey [name] is a 89 y.o. F admitted 09/22/24 with AMS and fall. Imaging came back negative for acute abnormalities and fx. Dx with SIRS (systemic inflammatory response syndrome), Acute encephalopathy. PMH includes hypertension, GERD, A-fib, CKD 3, cervical disease.     Clinical Impressions Pt from [place] where she typically ambulated with [facility], prepares her own Breakfast and lunch, and does her own ADL. She does not drive anymore. Today she presents with deficits in balance, strength, cognition, activity tolerance. She is set up for seated ADL, mod to max A for LB ADL and management of clothing for toileting. She benefited immensely from use of RW for mobility and was min A for transfers. She will benefit from skilled OT acutely and while she (and her family) are hopeful to progress to [facility], currently she will require skilled OT at post-acute rehab of <3 hours daily to maximize safety and independence in ADL and functional transfers. Next session focus on continued dynamic movement and access to LB for ADL     If plan is discharge home, recommend the following:   A little help with walking and/or transfers;A lot of help with bathing/dressing/bathroom;Assistance with cooking/housework;Direct supervision/assist for medications management;Direct supervision/assist for financial management;Assist for transportation;Help with stairs or ramp for entrance;Supervision due to cognitive status     Functional Status Assessment   Patient has had a recent decline in their functional status and demonstrates the ability to make significant improvements in function in a reasonable and predictable amount of time.     Equipment Recommendations   None recommended by OT (Pt has appropriate DME)     Recommendations for Other  Services   PT consult     Precautions/Restrictions   Precautions Precautions: Fall Recall of Precautions/Restrictions: Impaired Restrictions Weight Bearing Restrictions Per Provider Order: No     Mobility Bed Mobility               General bed mobility comments: OOB in recliner at beginning and end of session    Transfers Overall transfer level: Needs assistance Equipment used: Rolling walker (2 wheels) Transfers: Sit to/from Stand Sit to Stand: Mod assist           General transfer comment: initially mod A, with repetitions improved to min guard      Balance Overall balance assessment: Needs assistance Sitting-balance support: Feet supported Sitting balance-Leahy Scale: Fair     Standing balance support: Bilateral upper extremity supported, During functional activity, No upper extremity supported Standing balance-Leahy Scale: Poor Standing balance comment: dependent for dynamic movement                           ADL either performed or assessed with clinical judgement   ADL Overall ADL's : Needs assistance/impaired Eating/Feeding: Set up   Grooming: Oral care;Wash/dry hands;Wash/dry face;Set up;Standing Grooming Details (indicate cue type and reason): at sink Upper Body Bathing: Minimal assistance   Lower Body Bathing: Moderate assistance;Sitting/lateral leans Lower Body Bathing Details (indicate cue type and reason): knees down Upper Body Dressing : Set up   Lower Body Dressing: Maximal assistance;Sit to/from stand   Toilet Transfer: Minimal assistance;Ambulation;Rolling walker (2 wheels)   Toileting- Clothing Manipulation and Hygiene: Maximal assistance;Sit to/from stand       Functional mobility during ADLs: Minimal assistance;Rolling walker (2 wheels);Cueing for  safety General ADL Comments: decreased ability to access LB for ADL, decreased cognition, balance, activity tolerance     Vision Baseline Vision/History: 1 Wears  glasses;6 Macular Degeneration Patient Visual Report: No change from baseline Additional Comments: I need to go see my eye MD changes but not acutely, can still read     Perception         Praxis         Pertinent Vitals/Pain Pain Assessment Pain Assessment: No/denies pain     Extremity/Trunk Assessment Upper Extremity Assessment Upper Extremity Assessment: Generalized weakness   Lower Extremity Assessment Lower Extremity Assessment: Defer to PT evaluation   Cervical / Trunk Assessment Cervical / Trunk Assessment: Normal   Communication Communication Communication: No apparent difficulties   Cognition Arousal: Alert Behavior During Therapy: WFL for tasks assessed/performed Cognition: Cognition impaired     Awareness: Intellectual awareness intact, Online awareness impaired Memory impairment (select all impairments): Working civil service fast streamer, Short-term memory Attention impairment (select first level of impairment): Sustained attention Executive functioning impairment (select all impairments): Reasoning, Problem solving OT - Cognition Comments: not at baseline per daughter, decreased awareness, unable to ignore distraction/excess stimulation                 Following commands: Intact       Cueing  General Comments   Cueing Techniques: Verbal cues  daughter present and supportive throughout; SpO2 WFL on RA   Exercises     Shoulder Instructions      Home Living Family/patient expects to be discharged to:: Private residence (ILF at [employer]) Living Arrangements: Alone Available Help at Discharge: Family;Available 24 hours/day Type of Home: Apartment Home Access: Level entry     Home Layout: One level     Bathroom Shower/Tub: Producer, Television/film/video: Standard Bathroom Accessibility: Yes How Accessible: Accessible via walker Home Equipment: Cane - single point;Grab bars - toilet;Grab bars - tub/shower;Hand held shower head;Shower seat - built  in   Additional Comments: dinner in dining hall, usually fixes her own breakfast and lunch      Prior Functioning/Environment Prior Level of Function : Independent/Modified Independent             Mobility Comments: been through several 'bouts of PT - typically uses [facility] but last time with PT said that it was time to progress to RW ADLs Comments: does her own bathing/showering and cooking breakfast/lunch    OT Problem List: Decreased strength;Decreased activity tolerance;Impaired balance (sitting and/or standing);Decreased safety awareness;Cardiopulmonary status limiting activity   OT Treatment/Interventions: Self-care/ADL training;DME and/or AE instruction;Therapeutic activities;Cognitive remediation/compensation;Visual/perceptual remediation/compensation;Patient/family education;Balance training      OT Goals(Current goals can be found in the care plan section)   Acute Rehab OT Goals Patient Stated Goal: get back to [place] OT Goal Formulation: With patient/family Time For Goal Achievement: 10/08/24 Potential to Achieve Goals: Good   OT Frequency:  Min 2X/week    Co-evaluation PT/OT/SLP Co-Evaluation/Treatment: Yes Reason for Co-Treatment: Necessary to address cognition/behavior during functional activity;To address functional/ADL transfers PT goals addressed during session: Mobility/safety with mobility;Balance;Proper use of DME;Strengthening/ROM OT goals addressed during session: ADL's and self-care;Strengthening/ROM      AM-PAC OT 6 Clicks Daily Activity     Outcome Measure Help from another person eating meals?: A Little Help from another person taking care of personal grooming?: A Little Help from another person toileting, which includes using toliet, bedpan, or urinal?: A Lot Help from another person bathing (including washing, rinsing, drying)?: A Little Help from another person  to put on and taking off regular upper body clothing?: A Little Help from  another person to put on and taking off regular lower body clothing?: A Lot 6 Click Score: 16   End of Session Equipment Utilized During Treatment: Gait belt;Rolling walker (2 wheels) Nurse Communication: Mobility status;Precautions  Activity Tolerance: Patient tolerated treatment well Patient left: in chair;with call bell/phone within reach;with family/visitor present  OT Visit Diagnosis: Unsteadiness on feet (R26.81);Other abnormalities of gait and mobility (R26.89);Muscle weakness (generalized) (M62.81);History of falling (Z91.81);Low vision, both eyes (H54.2)                Time: 8894-8845 OT Time Calculation (min): 49 min Charges:  OT General Charges $OT Visit: 1 Visit OT Evaluation $OT Eval Moderate Complexity: 1 Mod OT Treatments $Self Care/Home Management : 8-22 mins  Leita DEL OTR/L Acute Rehabilitation Services Office: [phone]  Leita PARAS [facility] 09/24/2024, 2:00 PM

## 2024-09-24 NOTE — Plan of Care (Signed)
  Problem: Safety: Goal: Non-violent Restraint(s) Outcome: Progressing   Problem: Fluid Volume: Goal: Hemodynamic stability will improve Outcome: Progressing   Problem: Clinical Measurements: Goal: Diagnostic test results will improve Outcome: Progressing Goal: Signs and symptoms of infection will decrease Outcome: Progressing   Problem: Respiratory: Goal: Ability to maintain adequate ventilation will improve Outcome: Progressing   Problem: Education: Goal: Knowledge of General Education information will improve Description: Including pain rating scale, medication(s)/side effects and non-pharmacologic comfort measures Outcome: Progressing   Problem: Health Behavior/Discharge Planning: Goal: Ability to manage health-related needs will improve Outcome: Progressing   Problem: Clinical Measurements: Goal: Ability to maintain clinical measurements within normal limits will improve Outcome: Progressing Goal: Will remain free from infection Outcome: Progressing Goal: Diagnostic test results will improve Outcome: Progressing Goal: Respiratory complications will improve Outcome: Progressing Goal: Cardiovascular complication will be avoided Outcome: Progressing   Problem: Activity: Goal: Risk for activity intolerance will decrease Outcome: Progressing   Problem: Nutrition: Goal: Adequate nutrition will be maintained Outcome: Progressing   Problem: Coping: Goal: Level of anxiety will decrease Outcome: Progressing   Problem: Elimination: Goal: Will not experience complications related to bowel motility Outcome: Progressing Goal: Will not experience complications related to urinary retention Outcome: Progressing   Problem: Pain Managment: Goal: General experience of comfort will improve and/or be controlled Outcome: Progressing   Problem: Safety: Goal: Ability to remain free from injury will improve Outcome: Progressing   Problem: Skin Integrity: Goal: Risk for  impaired skin integrity will decrease Outcome: Progressing

## 2024-09-24 NOTE — Evaluation (Signed)
 Physical Therapy Evaluation Patient Details Name: Bailey [name] MRN: [id number] DOB: [birth year] Today's Date: 09/24/2024  History of Present Illness  Bailey [name] is a 89 y.o. F admitted 09/22/24 with AMS and fall. Imaging came back negative for acute abnormalities and fx. Dx with SIRS (systemic inflammatory response syndrome), Acute encephalopathy. PMH includes hypertension, GERD, A-fib, CKD 3, cervical disease.  Clinical Impression  Pt is presenting at  Mod A to CGA for sit to stand from various surfaces, CGA to Min A for gait with RW. Previously pt was Mod I at ILF with SPC. Due to pt current functional status, home set up and available assistance at home recommending skilled physical therapy services < 3 hours/day in order to address strength, balance and functional mobility to decrease risk for falls, injury, immobility, skin break down and re-hospitalization.        If plan is discharge home, recommend the following: Assist for transportation;Assistance with cooking/housework   Can travel by private vehicle   Yes    Equipment Recommendations None recommended by PT     Functional Status Assessment Patient has had a recent decline in their functional status and demonstrates the ability to make significant improvements in function in a reasonable and predictable amount of time.     Precautions / Restrictions Precautions Precautions: Fall Recall of Precautions/Restrictions: Impaired Restrictions Weight Bearing Restrictions Per Provider Order: No      Mobility  Bed Mobility   General bed mobility comments: OOB in recliner at beginning and end of session    Transfers Overall transfer level: Needs assistance Equipment used: Rolling walker (2 wheels) Transfers: Sit to/from Stand Sit to Stand: Mod assist           General transfer comment: initially mod A, with repetitions improved to min guard    Ambulation/Gait Ambulation/Gait assistance: Min assist Gait Distance  (Feet): 20 Feet (2x) Assistive device: Rolling walker (2 wheels) Gait Pattern/deviations: Step-through pattern, Trunk flexed, Decreased stride length Gait velocity: decreased Gait velocity interpretation: <1.31 ft/sec, indicative of household ambulator   General Gait Details: CGA for stability, MIn A for navigating AD      Balance Overall balance assessment: Needs assistance Sitting-balance support: Feet supported Sitting balance-Leahy Scale: Fair     Standing balance support: Bilateral upper extremity supported, During functional activity, No upper extremity supported Standing balance-Leahy Scale: Poor Standing balance comment: dependent for dynamic movement       Pertinent Vitals/Pain Pain Assessment Pain Assessment: No/denies pain Breathing: normal    Home Living Family/patient expects to be discharged to:: Private residence (ILF at [facility] [employer]) Living Arrangements: Alone Available Help at Discharge: Family;Available 24 hours/day Type of Home: Apartment Home Access: Level entry       Home Layout: One level Home Equipment: Cane - single point;Grab bars - toilet;Grab bars - tub/shower;Hand held shower head;Shower seat - built in Additional Comments: dinner in dining hall, usually fixes her own breakfast and lunch    Prior Function Prior Level of Function : Independent/Modified Independent             Mobility Comments: been through several 'bouts of PT - typically uses SPC but last time with PT said that it was time to progress to RW ADLs Comments: does her own bathing/showering and cooking breakfast/lunch     Extremity/Trunk Assessment   Upper Extremity Assessment Upper Extremity Assessment: Defer to OT evaluation    Lower Extremity Assessment Lower Extremity Assessment: Generalized weakness    Cervical / Trunk Assessment Cervical /  Trunk Assessment: Normal  Communication   Communication Communication: No apparent difficulties    Cognition Arousal:  Alert Behavior During Therapy: WFL for tasks assessed/performed   PT - Cognitive impairments: Safety/Judgement, Memory, Problem solving, Sequencing     Following commands: Intact       Cueing Cueing Techniques: Verbal cues     General Comments General comments (skin integrity, edema, etc.): Daughter present and supportive, SPO2 WFL on room air        Assessment/Plan    PT Assessment Patient needs continued PT services  PT Problem List Decreased strength;Decreased activity tolerance;Decreased balance;Decreased mobility;Decreased safety awareness       PT Treatment Interventions DME instruction;Balance training;Gait training;Functional mobility training;Therapeutic activities;Therapeutic exercise;Patient/family education    PT Goals (Current goals can be found in the Care Plan section)  Acute Rehab PT Goals Patient Stated Goal: to return to [facility] PT Goal Formulation: With patient/family Time For Goal Achievement: 10/08/24 Potential to Achieve Goals: Good    Frequency Min 2X/week     Co-evaluation   Reason for Co-Treatment: Necessary to address cognition/behavior during functional activity;To address functional/ADL transfers PT goals addressed during session: Mobility/safety with mobility;Balance;Proper use of DME;Strengthening/ROM OT goals addressed during session: ADL's and self-care;Strengthening/ROM       AM-PAC PT 6 Clicks Mobility  Outcome Measure Help needed turning from your back to your side while in a flat bed without using bedrails?: A Little Help needed moving from lying on your back to sitting on the side of a flat bed without using bedrails?: A Little Help needed moving to and from a bed to a chair (including a wheelchair)?: A Little Help needed standing up from a chair using your arms (e.g., wheelchair or bedside chair)?: A Little Help needed to walk in hospital room?: A Little Help needed climbing 3-5 steps with a railing? : A Lot 6 Click  Score: 17    End of Session Equipment Utilized During Treatment: Gait belt Activity Tolerance: Patient tolerated treatment well Patient left: in chair;with call bell/phone within reach;with family/visitor present Nurse Communication: Mobility status PT Visit Diagnosis: Unsteadiness on feet (R26.81);Other abnormalities of gait and mobility (R26.89);Muscle weakness (generalized) (M62.81)    Time: 8894-8845 PT Time Calculation (min) (ACUTE ONLY): 49 min   Charges:   PT Evaluation $PT Eval Low Complexity: 1 Low   PT General Charges $$ ACUTE PT VISIT: 1 Visit         Dorothyann Maier, DPT, CLT  Acute Rehabilitation Services Office: [phone] (Secure chat preferred)   Dorothyann VEAR Maier 09/24/2024, 3:40 PM

## 2024-09-24 NOTE — Telephone Encounter (Signed)
 Appreciate the update- we will follow along and check in with her after she is released with hospital follow up

## 2024-09-24 NOTE — Telephone Encounter (Signed)
 Please see patient message and advise.    Copied from CRM [id number]. Topic: General - Other >> Sep 24, 2024  9:41 AM China J wrote: Reason for CRM: Patient's daughter is calling in to let Dr. Katrinka know that the patient is currently hospitalized.

## 2024-09-24 NOTE — Plan of Care (Signed)
   Problem: Education: Goal: Knowledge of General Education information will improve Description Including pain rating scale, medication(s)/side effects and non-pharmacologic comfort measures Outcome: Progressing   Problem: Clinical Measurements: Goal: Ability to maintain clinical measurements within normal limits will improve Outcome: Progressing   Problem: Activity: Goal: Risk for activity intolerance will decrease Outcome: Progressing

## 2024-09-25 DIAGNOSIS — R6510 Systemic inflammatory response syndrome (SIRS) of non-infectious origin without acute organ dysfunction: Secondary | ICD-10-CM | POA: Diagnosis not present

## 2024-09-25 DIAGNOSIS — A419 Sepsis, unspecified organism: Secondary | ICD-10-CM | POA: Diagnosis present

## 2024-09-25 DIAGNOSIS — R338 Other retention of urine: Secondary | ICD-10-CM | POA: Diagnosis not present

## 2024-09-25 LAB — BASIC METABOLIC PANEL WITH GFR
Anion gap: 9 (ref 5–15)
BUN: 13 mg/dL (ref 8–23)
CO2: 21 mmol/L — ABNORMAL LOW (ref 22–32)
Calcium: 7.9 mg/dL — ABNORMAL LOW (ref 8.9–10.3)
Chloride: 102 mmol/L (ref 98–111)
Creatinine, Ser: 0.86 mg/dL (ref 0.44–1.00)
GFR, Estimated: >60 mL/min (ref >60)
Glucose, Bld: 99 mg/dL (ref 70–99)
Potassium: 3.2 mmol/L — ABNORMAL LOW (ref 3.5–5.1)
Sodium: 132 mmol/L — ABNORMAL LOW (ref 135–145)

## 2024-09-25 LAB — CBC WITH DIFFERENTIAL/PLATELET
Abs Immature Granulocytes: 0.05 K/uL (ref 0.00–0.07)
Basophils Absolute: 0.1 K/uL (ref 0.0–0.1)
Basophils Relative: 0 %
Eosinophils Absolute: 0.1 K/uL (ref 0.0–0.5)
Eosinophils Relative: 1 %
HCT: 29.7 % — ABNORMAL LOW (ref 36.0–46.0)
Hemoglobin: 9.9 g/dL — ABNORMAL LOW (ref 12.0–15.0)
Immature Granulocytes: 0 %
Lymphocytes Relative: 18 %
Lymphs Abs: 2.1 K/uL (ref 0.7–4.0)
MCH: 30.3 pg (ref 26.0–34.0)
MCHC: 33.3 g/dL (ref 30.0–36.0)
MCV: 90.8 fL (ref 80.0–100.0)
Monocytes Absolute: 1.8 K/uL — ABNORMAL HIGH (ref 0.1–1.0)
Monocytes Relative: 15 %
Neutro Abs: 7.8 K/uL — ABNORMAL HIGH (ref 1.7–7.7)
Neutrophils Relative %: 66 %
Platelets: 238 K/uL (ref 150–400)
RBC: 3.27 MIL/uL — ABNORMAL LOW (ref 3.87–5.11)
RDW: 13.1 % (ref 11.5–15.5)
WBC: 11.9 K/uL — ABNORMAL HIGH (ref 4.0–10.5)
nRBC: 0 % (ref 0.0–0.2)

## 2024-09-25 MED ORDER — CEFUROXIME AXETIL 250 MG PO TABS
250.0000 mg | ORAL_TABLET | Freq: Two times a day (BID) | ORAL | Status: DC
  Administered 2024-09-25 – 2024-09-26 (×3): 250 mg via ORAL
  Filled 2024-09-25 (×6): qty 1

## 2024-09-25 MED ORDER — METOPROLOL TARTRATE 25 MG PO TABS
25.0000 mg | ORAL_TABLET | Freq: Two times a day (BID) | ORAL | Status: DC
  Administered 2024-09-25 – 2024-09-27 (×4): 25 mg via ORAL
  Filled 2024-09-25 (×4): qty 1

## 2024-09-25 MED ORDER — TAMSULOSIN HCL 0.4 MG PO CAPS
0.4000 mg | ORAL_CAPSULE | Freq: Every day | ORAL | Status: DC
  Administered 2024-09-25 – 2024-09-26 (×2): 0.4 mg via ORAL
  Filled 2024-09-25 (×2): qty 1

## 2024-09-25 NOTE — Assessment & Plan Note (Addendum)
 Patient with urinary frequency prior to presentation. Urine culture has grown out E. Coli that is pan sensitive. The patient was on Azactam x 3 days. She was changed over to Augmentin on 09/25/2024. The patient presented with Sepsis due to this UTI. It is now resolved. Likely the UTI developed due to the patient's urinary retention.

## 2024-09-25 NOTE — Assessment & Plan Note (Signed)
 Resolved. The patient presented meeting sepsis criteria. Source of sepsis being UTI. She has received over 2L and has resolving criteria except that she continues to have an elevated WBC. Sepsis is likely due to UTI. Urine culture has grown out E. Coli. She is receiving Azactam to which the species is sensitive.

## 2024-09-25 NOTE — Assessment & Plan Note (Signed)
 Hemoglobin of 9.9 on 10/28 down from 11.2 on admission. At least partly due to dilutional effects. Normocytic. Monitor.

## 2024-09-25 NOTE — Progress Notes (Signed)
 Progress Note   Patient: Bailey [name] FMW:[id number] DOB: [birth year] DOA: 09/22/2024     3 DOS: the patient was seen and examined on 09/25/2024   Brief hospital course: The patient is a 89 yr old woman who was brought to the ED by her family with complaints of fall and altered mental status. Upon presentation the patient was grunting and not able to follow commands. The patient was last normal the previous day on the phone at 7 pm.  The daughter noted that the patient had had increased urine frequency lately.   She has a past medical history significant for hypertension, GERD, Atrial fibrillation, CKD 3, Raynaud's, and anemia.   In th ED the patient was found to have temp of 101.6, tachypnea with RR of 24, HR of 92, and a drop in BP from 185/72 to 159/51 with a WBC count of 19.8 meeting criteria for sepsis. She received a 1 L bolus of NS and then 150 cc/hr of NS. Urinalysis was negative, but urine culture has grown out 100K CFU of e coli. CTA of the head and neck with and without contrast was obtained and was negative for acute abnormalities. There is age related atrophy and chronic small vessel ischemia. Lactic acid was 3.4 initially, but did come down to 2.1. CT abdomen and pelvis demonstrates a cyst like structure in the patient's right groin. No signs of acute diverticulitis cholecystitis or cholelithiasis on CT abdomen and pelvis.  The patient was admitted to a medical bed. Blood cultures were obtained, but have had no growth.   On 09/23/2024 the patient has improved sensorium. She is oriented to place, but not year or date. Her potassium is again down to 3.2 after supplementation. The patient is receiving IV Azactam. Family is at bedside.  On 09/23/2024 2/2 blood cultures were positive for gram + rods. No susceptibilities will be performed. This is thought to be contaminate, but did come from both bottles. Will repeat cultures. Urine cultures have grown out E. Coli.  On 09/24/2024, the  patient is sitting up at bedside. She states that she is feeling better. Daughter is at bedside. Susceptibilities for E. Coli demonstrate sentifivity to azactam. Will continue current antibiotics.   On 09/25/2024 the patient is found to have severe urinary retention with >  1 liter in post void residual. She will have foley catheter placed if she has more than 300 cc in post void residual again. She will be started on Flomax each evening. She will need to follow up with urology as outpatient.   Assessment and Plan: Principal Problem:   SIRS (systemic inflammatory response syndrome) (HCC) Active Problems:   Paroxysmal atrial fibrillation (HCC)   GERD   Raynauds syndrome   HTN (hypertension)   Anemia   Sjogren's syndrome with keratoconjunctivitis sicca   CKD (chronic kidney disease), stage III (HCC)   Moderate aortic regurgitation   Acute encephalopathy   Sepsis: Resolved. The patient presented meeting sepsis criteria. Source of sepsis being UTI. She has received over 2L and has resolving criteria except that she continues to have an elevated WBC. Sepsis is likely due to UTI. Urine culture has grown out E. Coli. She is receiving Azactam to which the species is sensitive.   UTI Urine culture has grown out E. Coli which is found to be sensitive to azactam. Consider change to oral medication on 09/25/2204.   Urinary retention The patient is found to have severe urinary retention with >  1 liter in post  void residual. She will have foley catheter placed if she has more than 300 cc in post void residual again. She will be started on Flomax each evening. She will need to follow up with urology as outpatient.   Metabolic encephalopathy due to sepsis.  -Encephalopathy continues to clear. Pt is verbal and she is oriented to self and place, and time..   - CTA head and neck negative for acute pathology. There is evidence of age related atrophy and chronic small vessel ischemia. - EEG was performed  and was negative for signs of seizure. It had diffuse slowly consistent with acute encephalopathy.  Weakness: PT/OT to evaluate and treat.   Hypertension - Can continue metoprolol  - Consider losartan .   Sjogren's disease Raynaud's disease Sicca symptoms - Continue home eyedrops   History of A-fib - Continue metoprolol  - Not on anticoagulation   History of of CKD 3 - Creatinine stable at 0.85.  - Trend renal function and electrolytes   DVT prophylaxis:      Lovenox Code Status:              Full Family Communication:       Updated at bedside    Subjective: The patient is resting comfortably. Family at bedside.   Physical Exam: Vitals:   09/25/24 1200 09/25/24 1236 09/25/24 1522 09/25/24 1619  BP: (!) 186/68 (!) 186/68 (!) 151/54 (!) 165/65  Pulse: 79  86 89  Resp: (!) 23  15 19   Temp: 99.4 F (37.4 C)  98.7 F (37.1 C) 99.1 F (37.3 C)  TempSrc: Oral  Oral Oral  SpO2: 97%  98% 96%  Weight:      Height:       Exam:  Constitutional:  The patient is awake, alert, and oriented x 2. No acute distress. Eyes:  pupils and irises appear normal Normal lids and conjunctivae Respiratory:  No increased work of breathing. No wheezes, rales, or rhonchi No tactile fremitus Cardiovascular:  Regular rate and rhythm No murmurs, ectopy, or gallups. No lateral PMI. No thrills. Abdomen:  Abdomen is soft, non-tender, non-distended No hernias, masses, or organomegaly Normoactive bowel sounds.  Musculoskeletal:  No cyanosis, clubbing, or edema Skin:  No rashes, lesions, ulcers palpation of skin: no induration or nodules Neurologic:  CN 2-12 intact Sensation all 4 extremities intact Psychiatric:  The patient is unable to cooperate with exam.  Data Reviewed:  CBC, BMP  Family Communication: Family is at bedside.  Disposition: Status is: Inpatient Remains inpatient appropriate because: Awaiting susceptibilities. Continue IV antibiotics.  Planned Discharge  Destination: Home vs SNF pending PT/OT.    Time spent: 36 minutes  Author: Myndi Wamble, DO 09/25/2024 5:04 PM  For on call review [email or web address].

## 2024-09-25 NOTE — Assessment & Plan Note (Signed)
 Resolving.

## 2024-09-25 NOTE — TOC Initial Note (Signed)
 Transition of Care [facility]) - Initial/Assessment Note    Patient Details  Name: Bailey [name] MRN: [id number] Date of Birth: [birth year]  [facility] CM/SW Contact:    Montie LOISE Louder, LCSW Phone Number: 09/25/2024, 5:42 PM  Clinical Narrative:            CSW met with patient at bedside, along with her son, Charlena. CSW introduced self and explained role. CSW discussed recommendation for short term rehab at [facility]. Patient confirmed she lived at [facility]. Patient states understanding and agrees with recommendation for placement at SNF. CSW explained the SNF process. All questions answered.   TOC will provide bed offers once available.   Montie Louder, MSW, LCSW Clinical Social Worker         Expected Discharge Plan: Skilled Nursing Facility Barriers to Discharge: Continued Medical Work up   Patient Goals and CMS Choice            Expected Discharge Plan and Services In-house Referral: Clinical Social Work     Living arrangements for the past 2 months: Independent Living Facility                                      Prior Living Arrangements/Services Living arrangements for the past 2 months: Independent Living Facility Lives with:: Facility Resident          Need for Family Participation in Patient Care: Yes (Comment) Care giver support system in place?: Yes (comment)   Criminal Activity/Legal Involvement Pertinent to Current Situation/Hospitalization: No - Comment as needed  Activities of Daily Living   ADL Screening (condition at time of admission) Independently performs ADLs?: No Does the patient have a NEW difficulty with bathing/dressing/toileting/self-feeding that is expected to last >3 days?: Yes (Initiates electronic notice to provider for possible OT consult) Does the patient have a NEW difficulty with getting in/out of bed, walking, or climbing stairs that is expected to last >3 days?: Yes (Initiates electronic notice to provider  for possible PT consult) Does the patient have a NEW difficulty with communication that is expected to last >3 days?: No Is the patient deaf or have difficulty hearing?: No Does the patient have difficulty seeing, even when wearing glasses/contacts?: No Does the patient have difficulty concentrating, remembering, or making decisions?: Yes  Permission Sought/Granted Permission sought to share information with : Family Supports Permission granted to share information with : Yes, Verbal Permission Granted  Share Information with NAME: Arland Pool  Permission granted to share info w AGENCY: SNFs  Permission granted to share info w Relationship: daughter  Permission granted to share info w Contact Information: [phone]  Emotional Assessment Appearance:: Appears stated age Attitude/Demeanor/Rapport: Engaged Affect (typically observed): Accepting, Appropriate Orientation: : Oriented to Self, Oriented to Place, Oriented to  Time, Oriented to Situation Alcohol / Substance Use: Not Applicable Psych Involvement: No (comment)  Admission diagnosis:  SIRS (systemic inflammatory response syndrome) (HCC) [R65.10] Acute encephalopathy [G93.40] Severe sepsis (HCC) [A41.9, R65.20] Patient Active Problem List   Diagnosis Date Noted   Acute urinary retention 09/25/2024   Sepsis secondary to UTI (HCC) 09/25/2024   SIRS (systemic inflammatory response syndrome) (HCC) 09/22/2024   Acute encephalopathy 09/22/2024   Well woman exam with routine gynecological exam 11/28/2023   Postmenopausal hormone replacement therapy 10/17/2023   Dry eyes, bilateral 05/15/2018   Lumbar radiculopathy 12/12/2017   Allergy    CKD (chronic kidney disease),  stage III (HCC) 12/08/2017   Moderate aortic regurgitation 12/08/2017   Premature ventricular contractions    Hepatitis    History of skin cancer    Sjogren's syndrome with keratoconjunctivitis sicca 05/12/2017   Balance disorder 10/26/2016   Primary  osteoarthritis of both feet 10/26/2016   HTN (hypertension) 03/13/2015   Anemia 03/13/2015   Raynauds syndrome 10/21/2014   Recurrent cold sores 10/21/2014   Edema 06/29/2011   Primary osteoarthritis of both hands 08/21/2009   PVC (premature ventricular contraction) 06/04/2009   Mitral valve prolapse 12/25/2008   Paroxysmal atrial fibrillation (HCC) 09/05/2008   Osteopenia 09/05/2008   GERD 09/04/2008   Irritable bowel syndrome 09/04/2008   ALLERGY 09/04/2008   COLONIC POLYPS 09/04/2008   PCP:  Katrinka Garnette KIDD, MD Pharmacy:   [facility] [phone] - [place], [place] - [address] [facility] [address] [place] [address] Phone: [phone] Fax: [phone]  [facility] [address] [place] [address] Phone: [phone] Fax: [phone]     Social Drivers of Health (SDOH) Social History: SDOH Screenings   Food Insecurity: No Food Insecurity (09/22/2024)  Housing: Low Risk  (09/22/2024)  Transportation Needs: No Transportation Needs (09/22/2024)  Utilities: Not At Risk (09/22/2024)  Depression (PHQ2-9): Low Risk  (11/17/2023)  Financial Resource Strain: Low Risk  (11/15/2022)  Physical Activity: Insufficiently Active (11/17/2023)  Social Connections: Unknown (11/17/2023)  Stress: No Stress Concern Present (11/17/2023)  Tobacco Use: Medium Risk (09/22/2024)   SDOH Interventions:     Readmission Risk Interventions     No data to display

## 2024-09-25 NOTE — Assessment & Plan Note (Signed)
 GFR greater than 60. Improved from GFR's from 2022 or 2023.  Monitor.

## 2024-09-25 NOTE — Plan of Care (Signed)
  Problem: Fluid Volume: Goal: Hemodynamic stability will improve Outcome: Progressing   Problem: Respiratory: Goal: Ability to maintain adequate ventilation will improve Outcome: Progressing   Problem: Education: Goal: Knowledge of General Education information will improve Description: Including pain rating scale, medication(s)/side effects and non-pharmacologic comfort measures Outcome: Progressing   Problem: Nutrition: Goal: Adequate nutrition will be maintained Outcome: Progressing   Problem: Pain Managment: Goal: General experience of comfort will improve and/or be controlled Outcome: Progressing   Problem: Clinical Measurements: Goal: Ability to maintain clinical measurements within normal limits will improve Outcome: Not Progressing   Problem: Elimination: Goal: Will not experience complications related to urinary retention Outcome: Not Progressing

## 2024-09-25 NOTE — [place] FL2 (Signed)
 [place]  MEDICAID FL2 LEVEL OF CARE FORM     IDENTIFICATION  Patient Name: Bailey [name] Birthdate: July 26, 1933 Sex: female Admission Date (Current Location): 09/22/2024  [facility] and [place] Number:  Producer, Television/film/video and Address:  [facility], [address], [place], [place] [address]      Provider Number: [id number]  Attending Physician Name and Address:  Soledad Ligas, DO  Relative Name and Phone Number:  jolaine race (Daughter)  [phone] (Mobile)    Current Level of Care: Hospital Recommended Level of Care: Skilled Nursing Facility Prior Approval Number:    Date Approved/Denied:   PASRR Number: [id number]  Discharge Plan:      Current Diagnoses: Patient Active Problem List   Diagnosis Date Noted   SIRS (systemic inflammatory response syndrome) (HCC) 09/22/2024   Acute encephalopathy 09/22/2024   Well woman exam with routine gynecological exam 11/28/2023   Postmenopausal hormone replacement therapy 10/17/2023   Dry eyes, bilateral 05/15/2018   Lumbar radiculopathy 12/12/2017   Allergy    CKD (chronic kidney disease), stage III (HCC) 12/08/2017   Moderate aortic regurgitation 12/08/2017   Premature ventricular contractions    Hepatitis    History of skin cancer    Sjogren's syndrome with keratoconjunctivitis sicca 05/12/2017   Balance disorder 10/26/2016   Primary osteoarthritis of both feet 10/26/2016   HTN (hypertension) 03/13/2015   Anemia 03/13/2015   Raynauds syndrome 10/21/2014   Recurrent cold sores 10/21/2014   Edema 06/29/2011   Primary osteoarthritis of both hands 08/21/2009   PVC (premature ventricular contraction) 06/04/2009   Mitral valve prolapse 12/25/2008   Paroxysmal atrial fibrillation (HCC) 09/05/2008   Osteopenia 09/05/2008   GERD 09/04/2008   Irritable bowel syndrome 09/04/2008   ALLERGY 09/04/2008   COLONIC POLYPS 09/04/2008    Orientation RESPIRATION BLADDER Height & Weight     Self, Time,  Situation, Place  Normal External catheter Weight: 110 lb (49.9 kg) Height:  5' 2 (157.5 cm)  BEHAVIORAL SYMPTOMS/MOOD NEUROLOGICAL BOWEL NUTRITION STATUS      Incontinent Diet (heart)  AMBULATORY STATUS COMMUNICATION OF NEEDS Skin   Limited Assist Verbally Other (Comment) (redness)                       Personal Care Assistance Level of Assistance  Bathing, Feeding, Dressing Bathing Assistance: Limited assistance Feeding assistance: Limited assistance Dressing Assistance: Limited assistance     Functional Limitations Info  Sight, Hearing Sight Info: Impaired Hearing Info: Impaired      SPECIAL CARE FACTORS FREQUENCY  PT (By licensed PT), OT (By licensed OT)     PT Frequency: 5 times per week OT Frequency: 5 times per week            Contractures      Additional Factors Info  Code Status, Allergies Code Status Info: full Allergies Info: Sulfonamide Derivatives, Diamox (Acetazolamide), Epinephrine-lidocaine -na Metabisulfite (Lidocaine -epinephrine (Pf)), Erythromycin, Penicillins, Tape           Current Medications (09/25/2024):  This is the current hospital active medication list Current Facility-Administered Medications  Medication Dose Route Frequency Provider Last Rate Last Admin   acetaminophen  (TYLENOL ) tablet 650 mg  650 mg Oral Q6H PRN Seena Marsa KATHEE, MD       Or   acetaminophen  (TYLENOL ) suppository 650 mg  650 mg Rectal Q6H PRN Seena Marsa KATHEE, MD   650 mg at 09/22/24 2017   cefUROXime (CEFTIN) tablet 250 mg  250 mg Oral BID WC  Swayze, Ava, DO       cycloSPORINE  (RESTASIS ) 0.05 % ophthalmic emulsion 1 drop  1 drop Both Eyes BID Melvin, Alexander B, MD   1 drop at 09/25/24 1106   enoxaparin (LOVENOX) injection 40 mg  40 mg Subcutaneous Q24H Melvin, Alexander B, MD   40 mg at 09/25/24 0941   hydrALAZINE (APRESOLINE) injection 5 mg  5 mg Intravenous Q4H PRN Rathore, Vasundhra, MD   5 mg at 09/25/24 1236   losartan  (COZAAR ) tablet 100 mg  100  mg Oral Daily Melvin, Alexander B, MD   100 mg at 09/25/24 0941   metoprolol  tartrate (LOPRESSOR ) tablet 12.5 mg  12.5 mg Oral BID Swayze, Ava, DO   12.5 mg at 09/25/24 0941   polyethylene glycol (MIRALAX / GLYCOLAX) packet 17 g  17 g Oral Daily PRN Seena Marsa NOVAK, MD       sodium chloride  flush (NS) 0.9 % injection 3 mL  3 mL Intravenous Q12H Melvin, Alexander B, MD   3 mL at 09/25/24 0941   tamsulosin (FLOMAX) capsule 0.4 mg  0.4 mg Oral QPC supper Swayze, Ava, DO         Discharge Medications: Please see discharge summary for a list of discharge medications.  Relevant Imaging Results:  Relevant Lab Results:   Additional Information SS #: [id number]  Cymone Yeske E Kendrew Paci, LCSW

## 2024-09-25 NOTE — Assessment & Plan Note (Signed)
 The patient is found to have severe urinary retention with >  1 liter in post void residual. She will have foley catheter placed if she has more than 300 cc in post void residual again. She will be started on Flomax each evening. She will need to follow up with urology as outpatient.

## 2024-09-26 DIAGNOSIS — R6510 Systemic inflammatory response syndrome (SIRS) of non-infectious origin without acute organ dysfunction: Secondary | ICD-10-CM | POA: Diagnosis not present

## 2024-09-26 LAB — CBC WITH DIFFERENTIAL/PLATELET
Abs Immature Granulocytes: 0.05 K/uL (ref 0.00–0.07)
Basophils Absolute: 0 K/uL (ref 0.0–0.1)
Basophils Relative: 0 %
Eosinophils Absolute: 0.1 K/uL (ref 0.0–0.5)
Eosinophils Relative: 1 %
HCT: 29.7 % — ABNORMAL LOW (ref 36.0–46.0)
Hemoglobin: 10.2 g/dL — ABNORMAL LOW (ref 12.0–15.0)
Immature Granulocytes: 0 %
Lymphocytes Relative: 14 %
Lymphs Abs: 1.8 K/uL (ref 0.7–4.0)
MCH: 31 pg (ref 26.0–34.0)
MCHC: 34.3 g/dL (ref 30.0–36.0)
MCV: 90.3 fL (ref 80.0–100.0)
Monocytes Absolute: 2 K/uL — ABNORMAL HIGH (ref 0.1–1.0)
Monocytes Relative: 15 %
Neutro Abs: 9 K/uL — ABNORMAL HIGH (ref 1.7–7.7)
Neutrophils Relative %: 70 %
Platelets: 255 K/uL (ref 150–400)
RBC: 3.29 MIL/uL — ABNORMAL LOW (ref 3.87–5.11)
RDW: 13.3 % (ref 11.5–15.5)
WBC: 13 K/uL — ABNORMAL HIGH (ref 4.0–10.5)
nRBC: 0 % (ref 0.0–0.2)

## 2024-09-26 LAB — BASIC METABOLIC PANEL WITH GFR
Anion gap: 9 (ref 5–15)
BUN: 12 mg/dL (ref 8–23)
CO2: 20 mmol/L — ABNORMAL LOW (ref 22–32)
Calcium: 8 mg/dL — ABNORMAL LOW (ref 8.9–10.3)
Chloride: 102 mmol/L (ref 98–111)
Creatinine, Ser: 0.81 mg/dL (ref 0.44–1.00)
GFR, Estimated: >60 mL/min (ref >60)
Glucose, Bld: 106 mg/dL — ABNORMAL HIGH (ref 70–99)
Potassium: 3.2 mmol/L — ABNORMAL LOW (ref 3.5–5.1)
Sodium: 131 mmol/L — ABNORMAL LOW (ref 135–145)

## 2024-09-26 MED ORDER — POTASSIUM CHLORIDE CRYS ER 20 MEQ PO TBCR
40.0000 meq | EXTENDED_RELEASE_TABLET | ORAL | Status: AC
  Administered 2024-09-26 (×2): 40 meq via ORAL
  Filled 2024-09-26 (×2): qty 2

## 2024-09-26 MED ORDER — AMLODIPINE BESYLATE 10 MG PO TABS
10.0000 mg | ORAL_TABLET | Freq: Every day | ORAL | Status: DC
  Administered 2024-09-26 – 2024-09-27 (×2): 10 mg via ORAL
  Filled 2024-09-26 (×2): qty 1

## 2024-09-26 NOTE — TOC Progression Note (Signed)
 Transition of Care [facility]) - Progression Note    Patient Details  Name: Bailey [name] MRN: [id number] Date of Birth: December 25, 1932  Transition of [facility]) CM/SW Contact  Whitfield Campanile, Student-Social Work Phone Number: 09/26/2024, 11:55 AM  Clinical Narrative:     9:57 AM: MSW Intern presented at bedside and introduced self to patient and patient son. MSW Intern provided accepted SNF bed offer list. Patient's son stated patient' top choices are [facility] and [facility] SNF (offers still pending)  MSW intern called [facility] who will review referral. CSW sent message to [facility] for referral decision.  TOC continuing to follow.  Whitfield Campanile, MSW Intern  Expected Discharge Plan: Skilled Nursing Facility Barriers to Discharge: Continued Medical Work up               Expected Discharge Plan and Services In-house Referral: Clinical Social Work     Living arrangements for the past 2 months: Independent Living Facility                                       Social Drivers of Health (SDOH) Interventions SDOH Screenings   Food Insecurity: No Food Insecurity (09/22/2024)  Housing: Low Risk  (09/22/2024)  Transportation Needs: No Transportation Needs (09/22/2024)  Utilities: Not At Risk (09/22/2024)  Depression (PHQ2-9): Low Risk  (11/17/2023)  Financial Resource Strain: Low Risk  (11/15/2022)  Physical Activity: Insufficiently Active (11/17/2023)  Social Connections: Unknown (11/17/2023)  Stress: No Stress Concern Present (11/17/2023)  Tobacco Use: Medium Risk (09/22/2024)    Readmission Risk Interventions     No data to display

## 2024-09-26 NOTE — Progress Notes (Signed)
 Mobility Specialist Progress Note;   09/26/24 1244  Mobility  Activity Pivoted/transferred to/from [facility]  Level of Assistance Minimal assist, patient does 75% or more  Assistive Device None  Distance Ambulated (ft) 3 ft  Activity Response Tolerated well  Mobility Referral Yes  Mobility visit 1 Mobility  Mobility Specialist Start Time (ACUTE ONLY) 1244  Mobility Specialist Stop Time (ACUTE ONLY) 1255  Mobility Specialist Time Calculation (min) (ACUTE ONLY) 11 min   NT requesting assistance w/ pt. Required MinA to stand from [facility], NT performed pericare, and to safely transfer back to bed. No c/o when asked. Pt left in bed with all needs met. NT present.   Lauraine Erm Mobility Specialist Please contact via SecureChat or [employer] [phone]

## 2024-09-26 NOTE — Plan of Care (Signed)

## 2024-09-26 NOTE — TOC Progression Note (Signed)
 Transition of Care [facility]) - Progression Note    Patient Details  Name: Bailey [name] MRN: [id number] Date of Birth: 05-02-1933  Transition of Care [facility]) CM/SW Contact  Montie LOISE Louder, [place] Phone Number: 09/26/2024, 1:02 PM  Clinical Narrative:     CSW met with patient - informed [facility] offered but there is a 48.00 per day fee not included in insurance and [facility] offered. Patient states her preferred SNF is [facility].  [facility] confirmed availability and will be able to admit her tomorrow.  TOC will continue to follow and assist with discharge planning.  Montie Louder, MSW, LCSW Clinical Social Worker     Expected Discharge Plan: Skilled Nursing Facility Barriers to Discharge: Continued Medical Work up               Expected Discharge Plan and Services In-house Referral: Clinical Social Work     Living arrangements for the past 2 months: Independent Living Facility                                       Social Drivers of Health (SDOH) Interventions SDOH Screenings   Food Insecurity: No Food Insecurity (09/22/2024)  Housing: Low Risk  (09/22/2024)  Transportation Needs: No Transportation Needs (09/22/2024)  Utilities: Not At Risk (09/22/2024)  Depression (PHQ2-9): Low Risk  (11/17/2023)  Financial Resource Strain: Low Risk  (11/15/2022)  Physical Activity: Insufficiently Active (11/17/2023)  Social Connections: Unknown (11/17/2023)  Stress: No Stress Concern Present (11/17/2023)  Tobacco Use: Medium Risk (09/22/2024)    Readmission Risk Interventions     No data to display

## 2024-09-26 NOTE — Progress Notes (Signed)
 Physical Therapy Treatment Patient Details Name: Bailey [name] MRN: [id number] DOB: 10/31/1933 Today's Date: 09/26/2024   History of Present Illness Bailey [name] is a 89 y.o. F admitted 09/22/24 with AMS and fall. Imaging came back negative for acute abnormalities and fx. Dx with SIRS (systemic inflammatory response syndrome), Acute encephalopathy. PMH includes hypertension, GERD, A-fib, CKD 3, cervical disease.    PT Comments  Pt agreeable to transfer to recliner.  Pt able to bridge to pull up mesh panties over Purewick.  Pt requires min A for sup to sit and cues.  Pt transfers sit to stand w/ min A and steps to recliner.  Pt remained sitting in recliner w/ LES elevated and all needs in reach, family present.      If plan is discharge home, recommend the following:     Can travel by private vehicle        Equipment Recommendations       Recommendations for Other Services       Precautions / Restrictions Precautions Precautions: Fall Restrictions Weight Bearing Restrictions Per Provider Order: No     Mobility  Bed Mobility Overal bed mobility: Needs Assistance Bed Mobility: Supine to Sit     Supine to sit: Min assist     General bed mobility comments: Pt remained in recliner at end of session.    Transfers Overall transfer level: Needs assistance Equipment used: 1 person hand held assist Transfers: Sit to/from Stand Sit to Stand: Min assist                  Tilt Bed    Modified Rankin (Stroke Patients Only)       Balance Overall balance assessment: Needs assistance Sitting-balance support: Feet supported Sitting balance-Leahy Scale: Fair     Standing balance support: Single extremity supported Standing balance-Leahy Scale: Poor                              Communication    Cognition Arousal: Alert                                                Pertinent Vitals/Pain Pain Assessment Pain Assessment: Faces Pain  Location: L hand Pain Descriptors / Indicators: Aching Pain Intervention(s): Monitored during session    Home Living                          Prior Function            PT Goals (current goals can now be found in the care plan section) Acute Rehab PT Goals Time For Goal Achievement: 10/08/24 Potential to Achieve Goals: Good Progress towards PT goals: Progressing toward goals    Frequency           PT Plan      Co-evaluation              AM-PAC PT 6 Clicks Mobility   Outcome Measure  Help needed turning from your back to your side while in a flat bed without using bedrails?: A Little Help needed moving from lying on your back to sitting on the side of a flat bed without using bedrails?: A Little Help needed moving to and from a bed to a chair (including a wheelchair)?: A  Little Help needed standing up from a chair using your arms (e.g., wheelchair or bedside chair)?: A Little Help needed to walk in hospital room?: A Little Help needed climbing 3-5 steps with a railing? : A Lot 6 Click Score: 17    End of Session Equipment Utilized During Treatment: Gait belt Activity Tolerance: Patient tolerated treatment well Patient left: in chair;with call bell/phone within reach;with family/visitor present Nurse Communication: Mobility status PT Visit Diagnosis: Unsteadiness on feet (R26.81);Other abnormalities of gait and mobility (R26.89);Muscle weakness (generalized) (M62.81)     Time: 8943-8884 PT Time Calculation (min) (ACUTE ONLY): 19 min  Charges:    $Therapeutic Activity: 8-22 mins PT General Charges $$ ACUTE PT VISIT: 1 Visit                     Reyes MYRTIS Sierra, PT    Reyes SHAUNNA Sierra 09/26/2024, 11:51 AM

## 2024-09-27 ENCOUNTER — Other Ambulatory Visit (HOSPITAL_COMMUNITY): Payer: Self-pay

## 2024-09-27 DIAGNOSIS — K9189 Other postprocedural complications and disorders of digestive system: Secondary | ICD-10-CM | POA: Diagnosis not present

## 2024-09-27 DIAGNOSIS — I480 Paroxysmal atrial fibrillation: Secondary | ICD-10-CM | POA: Diagnosis not present

## 2024-09-27 DIAGNOSIS — G9340 Encephalopathy, unspecified: Secondary | ICD-10-CM | POA: Diagnosis not present

## 2024-09-27 DIAGNOSIS — R2689 Other abnormalities of gait and mobility: Secondary | ICD-10-CM | POA: Diagnosis not present

## 2024-09-27 DIAGNOSIS — N390 Urinary tract infection, site not specified: Secondary | ICD-10-CM | POA: Diagnosis not present

## 2024-09-27 DIAGNOSIS — R41841 Cognitive communication deficit: Secondary | ICD-10-CM | POA: Diagnosis not present

## 2024-09-27 DIAGNOSIS — R339 Retention of urine, unspecified: Secondary | ICD-10-CM | POA: Diagnosis not present

## 2024-09-27 DIAGNOSIS — M3501 Sicca syndrome with keratoconjunctivitis: Secondary | ICD-10-CM | POA: Diagnosis not present

## 2024-09-27 DIAGNOSIS — A419 Sepsis, unspecified organism: Secondary | ICD-10-CM | POA: Diagnosis not present

## 2024-09-27 DIAGNOSIS — N183 Chronic kidney disease, stage 3 unspecified: Secondary | ICD-10-CM | POA: Diagnosis not present

## 2024-09-27 DIAGNOSIS — L989 Disorder of the skin and subcutaneous tissue, unspecified: Secondary | ICD-10-CM | POA: Diagnosis not present

## 2024-09-27 DIAGNOSIS — R2681 Unsteadiness on feet: Secondary | ICD-10-CM | POA: Diagnosis not present

## 2024-09-27 DIAGNOSIS — R638 Other symptoms and signs concerning food and fluid intake: Secondary | ICD-10-CM | POA: Diagnosis not present

## 2024-09-27 DIAGNOSIS — D72829 Elevated white blood cell count, unspecified: Secondary | ICD-10-CM | POA: Diagnosis not present

## 2024-09-27 DIAGNOSIS — R6510 Systemic inflammatory response syndrome (SIRS) of non-infectious origin without acute organ dysfunction: Secondary | ICD-10-CM | POA: Diagnosis not present

## 2024-09-27 DIAGNOSIS — I10 Essential (primary) hypertension: Secondary | ICD-10-CM | POA: Diagnosis not present

## 2024-09-27 DIAGNOSIS — R234 Changes in skin texture: Secondary | ICD-10-CM | POA: Diagnosis not present

## 2024-09-27 DIAGNOSIS — M6281 Muscle weakness (generalized): Secondary | ICD-10-CM | POA: Diagnosis not present

## 2024-09-27 DIAGNOSIS — I7300 Raynaud's syndrome without gangrene: Secondary | ICD-10-CM | POA: Diagnosis not present

## 2024-09-27 LAB — BASIC METABOLIC PANEL WITH GFR
Anion gap: 12 (ref 5–15)
BUN: 13 mg/dL (ref 8–23)
CO2: 23 mmol/L (ref 22–32)
Calcium: 8.6 mg/dL — ABNORMAL LOW (ref 8.9–10.3)
Chloride: 99 mmol/L (ref 98–111)
Creatinine, Ser: 0.81 mg/dL (ref 0.44–1.00)
GFR, Estimated: >60 mL/min (ref >60)
Glucose, Bld: 112 mg/dL — ABNORMAL HIGH (ref 70–99)
Potassium: 4.6 mmol/L (ref 3.5–5.1)
Sodium: 134 mmol/L — ABNORMAL LOW (ref 135–145)

## 2024-09-27 LAB — CBC WITH DIFFERENTIAL/PLATELET
Abs Immature Granulocytes: 0.04 K/uL (ref 0.00–0.07)
Basophils Absolute: 0.1 K/uL (ref 0.0–0.1)
Basophils Relative: 0 %
Eosinophils Absolute: 0.1 K/uL (ref 0.0–0.5)
Eosinophils Relative: 1 %
HCT: 33.1 % — ABNORMAL LOW (ref 36.0–46.0)
Hemoglobin: 11.4 g/dL — ABNORMAL LOW (ref 12.0–15.0)
Immature Granulocytes: 0 %
Lymphocytes Relative: 13 %
Lymphs Abs: 1.6 K/uL (ref 0.7–4.0)
MCH: 31 pg (ref 26.0–34.0)
MCHC: 34.4 g/dL (ref 30.0–36.0)
MCV: 89.9 fL (ref 80.0–100.0)
Monocytes Absolute: 1.8 K/uL — ABNORMAL HIGH (ref 0.1–1.0)
Monocytes Relative: 14 %
Neutro Abs: 9.1 K/uL — ABNORMAL HIGH (ref 1.7–7.7)
Neutrophils Relative %: 72 %
Platelets: 301 K/uL (ref 150–400)
RBC: 3.68 MIL/uL — ABNORMAL LOW (ref 3.87–5.11)
RDW: 13.3 % (ref 11.5–15.5)
WBC: 12.6 K/uL — ABNORMAL HIGH (ref 4.0–10.5)
nRBC: 0 % (ref 0.0–0.2)

## 2024-09-27 LAB — CULTURE, BLOOD (ROUTINE X 2)
Culture: NO GROWTH
Special Requests: ADEQUATE

## 2024-09-27 MED ORDER — TAMSULOSIN HCL 0.4 MG PO CAPS
0.4000 mg | ORAL_CAPSULE | Freq: Every day | ORAL | 0 refills | Status: AC
Start: 2024-09-27 — End: ?
  Filled 2024-09-27: qty 30, 30d supply, fill #0

## 2024-09-27 MED ORDER — CEFUROXIME AXETIL 250 MG PO TABS
250.0000 mg | ORAL_TABLET | Freq: Two times a day (BID) | ORAL | 0 refills | Status: DC
  Filled 2024-09-27: qty 8, 4d supply, fill #0

## 2024-09-27 MED ORDER — AMLODIPINE BESYLATE 10 MG PO TABS
10.0000 mg | ORAL_TABLET | Freq: Every day | ORAL | 0 refills | Status: DC
  Filled 2024-09-27: qty 30, 30d supply, fill #0

## 2024-09-27 NOTE — Discharge Summary (Signed)
 Physician Discharge Summary   Patient: Bailey [name] MRN: [id number] DOB: 07-23-1933  Admit date:     09/22/2024  Discharge date:   Discharge Physician: Bailey [name]   PCP: Bailey Garnette KIDD, MD   Recommendations at discharge:    Discharge to SNF Follow up with PCP in 7-10 days.  The patient may need a follow up with urology. Will leave to PCP discretion. Will continue flomax for now.  Discharge Diagnoses: Principal Problem:   SIRS (systemic inflammatory response syndrome) (HCC) Active Problems:   Paroxysmal atrial fibrillation (HCC)   Raynauds syndrome   HTN (hypertension)   Anemia   CKD (chronic kidney disease), stage III (HCC)   Moderate aortic regurgitation   Acute encephalopathy   Acute urinary retention   Sepsis secondary to UTI [facility])  Resolved Problems:   * No resolved hospital problems. [facility] Course: The patient is a 89 yr old woman who was brought to the ED by her family for altered mental status. The patient is ordinarily oriented x 3 and verbal. She was only grunting upon admission. The daughter had noted that she was having urinary frequency for the days leading up to admission. She met 3/4 SIRS criteria with UTI as a likely source of sepsis. Lactic acid was initially 3.4.   The patient was admitted. She was started on Azactam. Urine culture was positive for E. Coli which was pan-sensitive. 1/2 blood culture grew out a gram positive rod that was very likely a contaminant. Repeat blood cultures were drawn and have had no growth.  During her course the patient also had an incident of urinary retention of greater than a liter. She had an I and O catheterization for this. She was started on flomax. There have been no reported recurrences.  She will be discharged on the flomax. I leave urology consult or further continuation to the discretion of the PCP.  The patient's mental status has gradually improved. Her appetite has improved. The patient has continued to express  that she feels tired every day. She was evaluated by PT who recommended a higher level of care for her.  She will be discharged to SNF today in fair condition.  Assessment and Plan: Acute urinary retention The patient is found to have severe urinary retention with >  1 liter in post void residual. She will have foley catheter placed if she has more than 300 cc in post void residual again. She will be started on Flomax each evening. She will be discharged on the flomax. Her need to follow up with urology as outpatient will be left to the PCP's discretion.  Acute encephalopathy Resolved and due top Sepsis related to the patient's UTI.  Anemia Hemoglobin of 9.9 on 10/28 down from 11.2 on admission. At least partly due to dilutional effects. Hemoglobin on the date of discharge is 11.4.  CKD (chronic kidney disease), stage III (HCC) GFR greater than 60. Improved from GFR's from 2022 or 2023.  Creatinine on the day of discharge is 0.81.  Sepsis secondary to UTI [facility]) Patient with urinary frequency prior to presentation. Urine culture has grown out E. Coli that is pan sensitive. The patient was on Azactam x 3 days. She was changed over to Augmentin on 09/25/2024. The patient presented with Sepsis due to this UTI. It is now resolved. Likely the UTI developed due to the patient's urinary retention.  SIRS (systemic inflammatory response syndrome) (HCC) Resolved. The patient presented meeting sepsis criteria. Source of sepsis being  UTI. She has received over 2L and has resolving criteria except that she continues to have an elevated WBC. Sepsis is likely due to UTI. Urine culture has grown out E. Coli. She is receiving Azactam to which the species is sensitive.   HTN (hypertension) Amlodipine was added to the patient's losartan  and metoprolol  to better control her BP.     Consultants: None Procedures performed: None  Disposition: Skilled nursing facility Diet recommendation:  Discharge Diet  Orders (From admission, onward)     Start     Ordered   09/27/24 0000  Diet - low sodium heart healthy        09/27/24 1251           Regular diet DISCHARGE MEDICATION: Allergies as of 09/27/2024       Reactions   Sulfonamide Derivatives Nausea Only, Other (See Comments)   Causes fever, kidney problems   Diamox [acetazolamide] Nausea Only   Epinephrine-lidocaine -na Metabisulfite [lidocaine -epinephrine (pf)] Other (See Comments)   Atrial fibrillation   Erythromycin Nausea Only   Penicillins Other (See Comments)   Unsure of reaction Tolerated cefuroxime Oct 2025   Tape Rash   Sensitive to skin        Medication List     TAKE these medications    amLODipine 10 MG tablet Commonly known as: NORVASC Take 1 tablet (10 mg total) by mouth daily. Start taking on: September 28, 2024   aspirin 81 MG tablet Take 81 mg by mouth daily.   cefUROXime 250 MG tablet Commonly known as: CEFTIN Take 1 tablet (250 mg total) by mouth 2 (two) times daily with a meal.   cycloSPORINE  0.05 % ophthalmic emulsion Commonly known as: RESTASIS  Place 1 drop into both eyes 2 (two) times daily.   escitalopram  10 MG tablet Commonly known as: LEXAPRO  Take 1 tablet (10 mg total) by mouth daily.   fish oil-omega-3 fatty acids 1000 MG capsule Take one capsule by mouth  daily   losartan  100 MG tablet Commonly known as: COZAAR  Take 1 tablet (100 mg total) by mouth daily.   metoprolol  tartrate 25 MG tablet Commonly known as: LOPRESSOR  Take 1 tablet (25 mg total) by mouth 2 (two) times daily.   PRESERVISION AREDS PO Take 1 capsule by mouth daily at 12 noon.   tamsulosin 0.4 MG Caps capsule Commonly known as: FLOMAX Take 1 capsule (0.4 mg total) by mouth daily after supper.   Vitamin D  50 MCG (2000 UT) Caps Take 2,000 Units by mouth daily.        Contact information for after-discharge care     Destination     [facility] .   Service: Skilled Nursing Contact information: [address] [place]  [address] [phone]                    Discharge Exam: Bailey [name]   09/22/24 1255  Weight: 49.9 kg   Exam:  Constitutional:  The patient is awake, alert, and oriented x 3. No acute distress. Respiratory:  No increased work of breathing. No wheezes, rales, or rhonchi No tactile fremitus Cardiovascular:  Regular rate and rhythm No murmurs, ectopy, or gallups. No lateral PMI. No thrills. Abdomen:  Abdomen is soft, non-tender, non-distended No hernias, masses, or organomegaly Normoactive bowel sounds.  Musculoskeletal:  No cyanosis, clubbing, or edema Skin:  No rashes, lesions, ulcers palpation of skin: no induration or nodules Neurologic:  CN 2-12 intact Sensation all 4 extremities intact Psychiatric:  Mental status  Mood, affect appropriate Orientation to person, place, time  judgment and insight appear intact   Condition at discharge: fair  The results of significant diagnostics from this hospitalization (including imaging, microbiology, ancillary and laboratory) are listed below for reference.   Imaging Studies: CT ABDOMEN PELVIS W CONTRAST Result Date: 09/22/2024 CLINICAL DATA:  Sepsis. EXAM: CT ABDOMEN AND PELVIS WITH CONTRAST TECHNIQUE: Multidetector CT imaging of the abdomen and pelvis was performed using the standard protocol following bolus administration of intravenous contrast. RADIATION DOSE REDUCTION: This exam was performed according to the departmental dose-optimization program which includes automated exposure control, adjustment of the mA and/or kV according to patient size and/or use of iterative reconstruction technique. CONTRAST:  50mL OMNIPAQUE IOHEXOL 350 MG/ML SOLN COMPARISON:  None Available. FINDINGS: Lower chest: The heart is mildly enlarged with four-chamber enlargement. Is also significant compression of the right ventricle by a pectus deformity. No pericardial effusion. No pulmonary infiltrates  or pleural effusions. Hepatobiliary: Small right hepatic lobe cyst. No worrisome hepatic lesions or evidence of hepatitis. Mild central intrahepatic biliary dilatation and mild common bile duct dilatation likely related to remote cholecystectomy. Pancreas: No mass, inflammation or ductal dilatation. Spleen: Tiny scattered low-attenuation splenic lesions on the portal venous phase images but on the delayed images this appears to resolved. I suspect this is probable heterogeneous enhancement due to red pulp white pulp differentiation. Adrenals/Urinary Tract: Adrenal glands are normal. No renal lesions. Mild to moderate bilateral hydroureteronephrosis likely due to a moderately distended bladder. No distal common bile duct stones. No bladder mass or calculi. Stomach/Bowel: The stomach, duodenum, small and colon are grossly normal. Examination is somewhat limited by motion artifact but no obvious inflammatory process or obstructive findings. There is significant and diffuse colonic diverticulosis but I do not see any definite findings for acute diverticulitis. Vascular/Lymphatic: The aorta and branch vessels are patent. Moderate scattered atherosclerotic calcifications. No dissection. The major venous structures are patent. Circumaortic left renal vein. No abdominal or pelvic lymphadenopathy. Reproductive: The uterus is unremarkable.  No adnexal mass. Other: 2.4 cm cystic area in the right inguinal region likely a benign cyst or fluid collection. Musculoskeletal: No significant bony findings. IMPRESSION: 1. Mild to moderate bilateral hydroureteronephrosis likely due to a moderately distended bladder. Estimated bladder volume is 700 mm. No distal common bile duct stones or bladder mass. 2. Status post cholecystectomy with mild central intrahepatic biliary dilatation and mild common bile duct dilatation. 3. Significant and diffuse colonic diverticulosis but no definite findings for acute diverticulitis. Aortic  Atherosclerosis (ICD10-I70.0). Electronically Signed   By: MYRTIS Stammer M.D.   On: 09/22/2024 21:27   EEG adult Result Date: 09/22/2024 Matthews Elida HERO, MD     09/23/2024  2:58 PM Routine EEG Report ALLISSA ALBRIGHT is a 89 y.o. female with a history of altered mental status who is undergoing an EEG to evaluate for seizures. Report: This EEG was acquired with electrodes placed according to the International 10-20 electrode system (including Fp1, Fp2, F3, F4, C3, C4, P3, P4, O1, O2, T3, T4, T5, T6, A1, A2, Fz, Cz, Pz). The following electrodes were missing or displaced: none. The occipital dominant rhythm was 7 Hz with intermittent more pronounced diffuse slowing. This activity is reactive to stimulation. Drowsiness was manifested by background fragmentation; deeper stages of sleep were identified by K complexes and sleep spindles. There was no focal slowing. There were no interictal epileptiform discharges. There were no electrographic seizures identified. There was no abnormal response to photic stimulation or hyperventilation. Impression  and clinical correlation: This EEG was obtained while awake and asleep and is abnormal due to mild-to-moderate diffuse slowing indicative of global cerebral dysfunction. Epileptiform abnormalities were not seen during this recording. Elida Ross, MD [facility] [phone] If 7pm- 7am, please page neurology on call as listed in AMION.   DG Chest Port 1 View Result Date: 09/22/2024 CLINICAL DATA:  Possible sepsis.  Found on ground, possible fall. EXAM: PORTABLE CHEST 1 VIEW COMPARISON:  10/27/2012. FINDINGS: The heart is enlarged the mediastinal contour is within normal limits. There is atherosclerotic calcification of the aorta. Stable pleuroparenchymal scarring is noted at the lung apices. No consolidation, effusion, or pneumothorax is seen. No acute osseous abnormality. IMPRESSION: No active disease. Electronically Signed   By: Leita Birmingham M.D.   On:  09/22/2024 14:13   CT ANGIO HEAD NECK W WO CM Result Date: 09/22/2024 EXAM: CTA HEAD AND NECK WITHOUT AND WITH 09/22/2024 01:28:02 PM TECHNIQUE: CTA of the head and neck was performed without and with the administration of 75 mL of iohexol (OMNIPAQUE) 350 MG/ML injection. Multiplanar 2D and/or 3D reformatted images are provided for review. Automated exposure control, iterative reconstruction, and/or weight based adjustment of the mA/kV was utilized to reduce the radiation dose to as low as reasonably achievable. Stenosis of the internal carotid arteries measured using NASCET criteria. COMPARISON: None available CLINICAL HISTORY: AMS. Unwitnessed fall. Patient was found on the ground this morning. FINDINGS: CTA NECK: AORTIC ARCH AND ARCH VESSELS: No dissection or arterial injury. No significant stenosis of the brachiocephalic or subclavian arteries. CERVICAL CAROTID ARTERIES: Minimal atherosclerotic change is present at the left carotid bifurcation without significant stenosis. No dissection or arterial injury. No hemodynamically significant stenosis by NASCET criteria. CERVICAL VERTEBRAL ARTERIES: The right vertebral artery is dominant. No dissection, arterial injury, or significant stenosis. LUNGS AND MEDIASTINUM: Unremarkable. SOFT TISSUES: No acute abnormality. BONES: Straightening of the normal cervical lordosis is present. Chronic loss of disc height and ankylosis is present at C4-C5, C5-C6 and C6-C7. Slight degenerative anterolisthesis is present at C7-T1. CTA HEAD: ANTERIOR CIRCULATION: Atherosclerotic changes are present within the cavernous internal carotid arteries bilaterally. No significant stenosis is present through the ICA termina. No significant stenosis of the anterior cerebral arteries. No significant stenosis of the middle cerebral arteries. No aneurysm. POSTERIOR CIRCULATION: Fetal type right posterior cerebral artery is noted. No significant stenosis of the posterior cerebral arteries. No  significant stenosis of the basilar artery. No significant stenosis of the vertebral arteries. No aneurysm. OTHER: No dural venous sinus thrombosis on this non-dedicated study. IMPRESSION: 1. No large vessel occlusion, hemodynamically significant stenosis, or aneurysm in the head or neck. 2. Minimal atherosclerotic change at the left carotid bifurcation without significant stenosis. 3. Atherosclerotic changes within the cavernous internal carotid arteries bilaterally without significant stenosis. Electronically signed by: Lonni Necessary MD 09/22/2024 02:07 PM EDT RP Workstation: [id number]   CT CERVICAL SPINE WO CONTRAST Result Date: 09/22/2024 CLINICAL DATA:  Provided history: Neck trauma (Age >= 65y) EXAM: CT CERVICAL SPINE WITHOUT CONTRAST TECHNIQUE: Multidetector CT imaging of the cervical spine was performed without intravenous contrast. Multiplanar CT image reconstructions were also generated. RADIATION DOSE REDUCTION: This exam was performed according to the departmental dose-optimization program which includes automated exposure control, adjustment of the mA and/or kV according to patient size and/or use of iterative reconstruction technique. COMPARISON:  None Available. FINDINGS: Alignment: Reversal of normal lordosis. No traumatic subluxation. Trace grade 1 anterolisthesis C3 on C4 and C7 on T1, likely degenerative. Skull base and vertebrae:  No acute fracture. Vertebral body heights are maintained. The dens and skull base are intact. Soft tissues and spinal canal: No prevertebral fluid or swelling. No visible canal hematoma. Disc levels: Moderate diffuse degenerative disc disease. Multilevel facet hypertrophy. Facet ankylosis at C2-C3 on the left is likely degenerative. Upper chest: Biapical pleuroparenchymal scarring. Other: None. IMPRESSION: 1. No acute fracture or traumatic subluxation of the cervical spine. 2. Multilevel degenerative disc disease and facet hypertrophy. Electronically Signed    By: Andrea Gasman M.D.   On: 09/22/2024 14:01   CT HEAD WO CONTRAST Result Date: 09/22/2024 CLINICAL DATA:  Provided history: Head trauma, minor (Age >= 65y) EXAM: CT HEAD WITHOUT CONTRAST TECHNIQUE: Contiguous axial images were obtained from the base of the skull through the vertex without intravenous contrast. RADIATION DOSE REDUCTION: This exam was performed according to the departmental dose-optimization program which includes automated exposure control, adjustment of the mA and/or kV according to patient size and/or use of iterative reconstruction technique. COMPARISON:  None Available. FINDINGS: Brain: No intracranial hemorrhage, mass effect, or midline shift. Age related atrophy. No hydrocephalus. The basilar cisterns are patent. Moderate periventricular and deep white matter hypodensity, nonspecific but typical of chronic small vessel ischemia. 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 No evidence of territorial infarct or acute ischemia. No extra-axial or intracranial fluid collection. Vascular: Atherosclerosis of skullbase vasculature without hyperdense vessel or abnormal calcification. Reference concurrently performed head CTA for vascular assessment. Skull: No fracture or focal lesion. Sinuses/Orbits: Paranasal sinuses and mastoid air cells are clear. The visualized orbits are unremarkable. Bilateral cataract resection Other: None IMPRESSION: 1. No acute intracranial abnormality. No skull fracture. 2. Age related atrophy and chronic small vessel ischemia. Electronically Signed   By: Andrea Gasman M.D.   On: 09/22/2024 13:55    Microbiology: Results for orders placed or performed during the hospital encounter of 09/22/24  Urine Culture     Status: Abnormal   Collection Time: 09/22/24  1:11 PM   Specimen: Urine, Catheterized  Result Value Ref Range Status   Specimen Description URINE, CATHETERIZED  Final   Special Requests   Final    NONE Performed at [facility],  [address], [place], [place] [address]    Culture >=100,000 COLONIES/mL ESCHERICHIA COLI (A)  Final   Report Status 09/24/2024 FINAL  Final   Organism ID, Bacteria ESCHERICHIA COLI (A)  Final      Susceptibility   Escherichia coli - MIC*    AMPICILLIN <=2 SENSITIVE Sensitive     CEFAZOLIN (URINE) Value in next row Sensitive      <=1 SENSITIVEThis is a modified FDA-approved test that has been validated and its performance characteristics determined by the reporting laboratory.  This laboratory is certified under the Clinical Laboratory Improvement Amendments CLIA as qualified to perform high complexity clinical laboratory testing.    CEFEPIME Value in next row Sensitive      <=1 SENSITIVEThis is a modified FDA-approved test that has been validated and its performance characteristics determined by the reporting laboratory.  This laboratory is certified under the Clinical Laboratory Improvement Amendments CLIA as qualified to perform high complexity clinical laboratory testing.    ERTAPENEM Value in next row Sensitive      <=1 SENSITIVEThis is a modified FDA-approved test that has been validated and its performance characteristics determined by the reporting laboratory.  This laboratory is certified under the Clinical Laboratory Improvement Amendments CLIA as qualified to perform high complexity clinical laboratory testing.    CEFTRIAXONE  Value in next row Sensitive      <=1 SENSITIVEThis is a modified FDA-approved test that has been validated and its performance characteristics determined by the reporting laboratory.  This laboratory is certified under the Clinical Laboratory Improvement Amendments CLIA as qualified to perform high complexity clinical laboratory testing.    CIPROFLOXACIN  Value in next row Sensitive      <=1 SENSITIVEThis is a modified FDA-approved test that has been validated and its performance characteristics determined by the reporting laboratory.  This laboratory is certified under  the Clinical Laboratory Improvement Amendments CLIA as qualified to perform high complexity clinical laboratory testing.    GENTAMICIN Value in next row Sensitive      <=1 SENSITIVEThis is a modified FDA-approved test that has been validated and its performance characteristics determined by the reporting laboratory.  This laboratory is certified under the Clinical Laboratory Improvement Amendments CLIA as qualified to perform high complexity clinical laboratory testing.    NITROFURANTOIN  Value in next row Sensitive      <=1 SENSITIVEThis is a modified FDA-approved test that has been validated and its performance characteristics determined by the reporting laboratory.  This laboratory is certified under the Clinical Laboratory Improvement Amendments CLIA as qualified to perform high complexity clinical laboratory testing.    TRIMETH/SULFA Value in next row Sensitive      <=1 SENSITIVEThis is a modified FDA-approved test that has been validated and its performance characteristics determined by the reporting laboratory.  This laboratory is certified under the Clinical Laboratory Improvement Amendments CLIA as qualified to perform high complexity clinical laboratory testing.    AMPICILLIN/SULBACTAM Value in next row Sensitive      <=1 SENSITIVEThis is a modified FDA-approved test that has been validated and its performance characteristics determined by the reporting laboratory.  This laboratory is certified under the Clinical Laboratory Improvement Amendments CLIA as qualified to perform high complexity clinical laboratory testing.    PIP/TAZO Value in next row Sensitive      <=4 SENSITIVEThis is a modified FDA-approved test that has been validated and its performance characteristics determined by the reporting laboratory.  This laboratory is certified under the Clinical Laboratory Improvement Amendments CLIA as qualified to perform high complexity clinical laboratory testing.    MEROPENEM Value in next row  Sensitive      <=4 SENSITIVEThis is a modified FDA-approved test that has been validated and its performance characteristics determined by the reporting laboratory.  This laboratory is certified under the Clinical Laboratory Improvement Amendments CLIA as qualified to perform high complexity clinical laboratory testing.    * >=100,000 COLONIES/mL ESCHERICHIA COLI  Blood Culture (routine x 2)     Status: Abnormal   Collection Time: 09/22/24  1:25 PM   Specimen: BLOOD  Result Value Ref Range Status   Specimen Description BLOOD LEFT ANTECUBITAL  Final   Special Requests   Final    BOTTLES DRAWN AEROBIC AND ANAEROBIC Blood Culture results may not be optimal due to an inadequate volume of blood received in culture bottles   Culture  Setup Time   Final    GRAM POSITIVE RODS AEROBIC BOTTLE ONLY CRITICAL RESULT CALLED TO, READ BACK BY AND VERIFIED WITH: PHARMD JADE DINNIGER [phone] [id number] BY J RAZZAK, [place]    Culture (A)  Final    BACILLUS SPECIES Standardized susceptibility testing for this organism is not available. Performed at [facility], [address], [place], [place] [address]    Report Status 09/24/2024 FINAL  Final  Blood  Culture (routine x 2)     Status: None   Collection Time: 09/22/24  1:30 PM   Specimen: BLOOD RIGHT FOREARM  Result Value Ref Range Status   Specimen Description BLOOD RIGHT FOREARM  Final   Special Requests   Final    BOTTLES DRAWN AEROBIC AND ANAEROBIC Blood Culture adequate volume   Culture   Final    NO GROWTH 5 DAYS Performed at [facility], [address], [name], [place] [address]    Report Status 09/27/2024 FINAL  Final  Resp panel by RT-PCR (RSV, Flu A&B, Covid) Anterior Nasal Swab     Status: None   Collection Time: 09/22/24  1:51 PM   Specimen: Anterior Nasal Swab  Result Value Ref Range Status   SARS Coronavirus 2 by RT PCR NEGATIVE NEGATIVE Final   Influenza A by PCR NEGATIVE NEGATIVE Final   Influenza B by PCR NEGATIVE NEGATIVE  Final    Comment: (NOTE) The Xpert Xpress SARS-CoV-2/FLU/RSV plus assay is intended as an aid in the diagnosis of influenza from Nasopharyngeal swab specimens and should not be used as a sole basis for treatment. Nasal washings and aspirates are unacceptable for Xpert Xpress SARS-CoV-2/FLU/RSV testing.  Fact Sheet for Patients: bloggercourse.com  Fact Sheet for Healthcare Providers: seriousbroker.it  This test is not yet approved or cleared by the [place]  FDA and has been authorized for detection and/or diagnosis of SARS-CoV-2 by FDA under an Emergency Use Authorization (EUA). This EUA will remain in effect (meaning this test can be used) for the duration of the COVID-19 declaration under Section 564(b)(1) of the Act, 21 U.S.C. section 360bbb-3(b)(1), unless the authorization is terminated or revoked.     Resp Syncytial Virus by PCR NEGATIVE NEGATIVE Final    Comment: (NOTE) Fact Sheet for Patients: bloggercourse.com  Fact Sheet for Healthcare Providers: seriousbroker.it  This test is not yet approved or cleared by the [place]  FDA and has been authorized for detection and/or diagnosis of SARS-CoV-2 by FDA under an Emergency Use Authorization (EUA). This EUA will remain in effect (meaning this test can be used) for the duration of the COVID-19 declaration under Section 564(b)(1) of the Act, 21 U.S.C. section 360bbb-3(b)(1), unless the authorization is terminated or revoked.  Performed at [facility], [address] [place], [place] [address]   Culture, blood (Routine X 2) w Reflex to ID Panel     Status: None (Preliminary result)   Collection Time: 09/23/24  2:01 PM   Specimen: BLOOD RIGHT HAND  Result Value Ref Range Status   Specimen Description BLOOD RIGHT HAND  Final   Special Requests   Final    BOTTLES DRAWN AEROBIC ONLY Blood Culture results may  not be optimal due to an inadequate volume of blood received in culture bottles   Culture   Final    NO GROWTH 4 DAYS Performed at [facility], [address] [place], [place] [address]    Report Status PENDING  Incomplete  Culture, blood (Routine X 2) w Reflex to ID Panel     Status: None (Preliminary result)   Collection Time: 09/23/24  2:06 PM   Specimen: BLOOD LEFT ARM  Result Value Ref Range Status   Specimen Description BLOOD LEFT ARM  Final   Special Requests   Final    BOTTLES DRAWN AEROBIC ONLY Blood Culture results may not be optimal due to an inadequate volume of blood received in culture bottles   Culture  Final    NO GROWTH 4 DAYS Performed at [facility], [address] [place], [place] [address]    Report Status PENDING  Incomplete    Labs: CBC: Recent Labs  Lab 09/22/24 1311 09/22/24 1332 09/23/24 0602 09/24/24 0248 09/25/24 0312 09/26/24 0319 09/27/24 0624  WBC 19.8*  --  21.7* 14.0* 11.9* 13.0* 12.6*  NEUTROABS 17.0*  --   --  10.3* 7.8* 9.0* 9.1*  HGB 12.9   < > 11.2* 9.5* 9.9* 10.2* 11.4*  HCT 38.3   < > 32.7* 28.0* 29.7* 29.7* 33.1*  MCV 89.9  --  89.3 90.0 90.8 90.3 89.9  PLT 346  --  274 220 238 255 301   < > = values in this interval not displayed.   Basic Metabolic Panel: Recent Labs  Lab 09/22/24 1700 09/23/24 0602 09/24/24 0248 09/25/24 0312 09/26/24 0319 09/27/24 0624  NA 129* 132* 129* 132* 131* 134*  K 3.5 3.2* 2.9* 3.2* 3.2* 4.6  CL 94* 99 103 102 102 99  CO2 19* 22 19* 21* 20* 23  GLUCOSE 158* 124* 100* 99 106* 112*  BUN 13 10 11 13 12 13   CREATININE 0.91 0.91 0.85 0.86 0.81 0.81  CALCIUM 9.3 8.4* 7.8* 7.9* 8.0* 8.6*  MG 1.6* 1.9  --   --   --   --    Liver Function Tests: Recent Labs  Lab 09/22/24 1311 09/22/24 1700 09/23/24 0602  AST 40 43* 46*  ALT 34 34 36  ALKPHOS 81 90 73  BILITOT 1.2 1.0 1.1  PROT 7.7 7.5 6.1*  ALBUMIN 4.0 4.1 3.2*   CBG: Recent Labs  Lab 09/22/24 1307  GLUCAP 156*     Discharge time spent: greater than 30 minutes.  Signed: Delailah Spieth, DO [facility] 09/27/2024

## 2024-09-27 NOTE — Progress Notes (Signed)
 DISCHARGE NOTE SNF [facility] to be discharged Home per MD order. Patient verbalized understanding.  Skin clean, dry and intact without evidence of skin break down, no evidence of skin tears noted. IV catheter discontinued intact. Site without signs and symptoms of complications. Dressing and pressure applied. Pt denies pain at the site currently. No complaints noted.  Patient free of lines, drains, and wounds.   Discharge packet assembled. Copy givent ot patient family and reviewed medications with pateint, An After Visit Summary (AVS) was printed and given to the EMS personnel. Patient escorted via stretcher and discharged to [facility] via ambulance. Report called to accepting facility; all questions and concerns addressed.   Peyton SHAUNNA Pepper, RN

## 2024-09-27 NOTE — Progress Notes (Signed)
 Occupational Therapy Treatment Patient Details Name: Bailey [name] MRN: [id number] DOB: 1933/10/14 Today's Date: 09/27/2024   History of present illness Bailey [name] is a 89 y.o. F admitted 09/22/24 with AMS and fall. Imaging came back negative for acute abnormalities and fx. Dx with SIRS (systemic inflammatory response syndrome), Acute encephalopathy. PMH includes hypertension, GERD, A-fib, CKD 3, cervical disease.   OT comments  Pt with no recall of previous session with therapy. But pleasant and willing to work today. Pt ambulated min A into bathroom, able to perform peri care in standing (front and back) as well as sink level grooming in standing with CGA. Some assist setting up oral care. Pt then ambulated to door and then to bed. VSS throughout session. POC remains appropriate and Pt continues to benefit from OT acutely and at rehab post-acute at <3 hours daily.       If plan is discharge home, recommend the following:  A little help with walking and/or transfers;A lot of help with bathing/dressing/bathroom;Assistance with cooking/housework;Direct supervision/assist for medications management;Direct supervision/assist for financial management;Assist for transportation;Help with stairs or ramp for entrance;Supervision due to cognitive status   Equipment Recommendations  None recommended by OT (Pt has appropriate DME)    Recommendations for Other Services PT consult    Precautions / Restrictions Precautions Precautions: Fall Recall of Precautions/Restrictions: Impaired Restrictions Weight Bearing Restrictions Per Provider Order: No       Mobility Bed Mobility Overal bed mobility: Needs Assistance Bed Mobility: Supine to Sit     Supine to sit: Min assist     General bed mobility comments: Pt remained in recliner at end of session.    Transfers Overall transfer level: Needs assistance Equipment used: 1 person hand held assist Transfers: Sit to/from Stand Sit to Stand: Min  assist           General transfer comment: initially mod A, with repetitions improved to min A     Balance Overall balance assessment: Needs assistance Sitting-balance support: Feet supported Sitting balance-Leahy Scale: Fair     Standing balance support: Single extremity supported Standing balance-Leahy Scale: Poor Standing balance comment: dependent for dynamic movement                           ADL either performed or assessed with clinical judgement   ADL Overall ADL's : Needs assistance/impaired     Grooming: Oral care;Wash/dry hands;Wash/dry face;Set up;Standing Grooming Details (indicate cue type and reason): at sink Upper Body Bathing: Moderate assistance;Sitting Upper Body Bathing Details (indicate cue type and reason): back sitting EOB at end of session     Upper Body Dressing : Moderate assistance;Sitting Upper Body Dressing Details (indicate cue type and reason): donning big cozy bathrobe     Toilet Transfer: Minimal assistance;Ambulation;Rolling walker (2 wheels) Toilet Transfer Details (indicate cue type and reason): cues for safety with RW Toileting- Clothing Manipulation and Hygiene: Sit to/from stand;Contact guard assist Toileting - Clothing Manipulation Details (indicate cue type and reason): front and rear peri care     Functional mobility during ADLs: Minimal assistance;Rolling walker (2 wheels);Cueing for safety General ADL Comments: decreased cognition, balance, activity tolerance    Extremity/Trunk Assessment Upper Extremity Assessment Upper Extremity Assessment: Generalized weakness (L hand sore from IV)   Lower Extremity Assessment Lower Extremity Assessment: Defer to PT evaluation        Vision       Perception     Praxis  Communication Communication Communication: No apparent difficulties   Cognition Arousal: Alert Behavior During Therapy: WFL for tasks assessed/performed Cognition: Cognition impaired      Awareness: Intellectual awareness intact, Online awareness impaired Memory impairment (select all impairments): Working civil service fast streamer, Short-term memory Attention impairment (select first level of impairment): Sustained attention Executive functioning impairment (select all impairments): Reasoning, Problem solving OT - Cognition Comments: did not remeber previous therapy sessoion. decreased awareness, unable to ignore distraction/excess stimulation                 Following commands: Intact        Cueing   Cueing Techniques: Verbal cues  Exercises      Shoulder Instructions       General Comments      Pertinent Vitals/ Pain       Pain Assessment Pain Assessment: Faces Faces Pain Scale: Hurts a little bit Pain Location: L hand Pain Descriptors / Indicators: Aching, Sore Pain Intervention(s): Monitored during session, Repositioned  Home Living                                          Prior Functioning/Environment              Frequency  Min 2X/week        Progress Toward Goals  OT Goals(current goals can now be found in the care plan section)  Progress towards OT goals: Progressing toward goals  Acute Rehab OT Goals Patient Stated Goal: get stronger OT Goal Formulation: With patient/family Time For Goal Achievement: 10/08/24 Potential to Achieve Goals: Good ADL Goals Pt Will Perform Grooming: with supervision;standing Pt Will Perform Upper Body Dressing: with modified independence;sitting Pt Will Perform Lower Body Dressing: with contact guard assist;sit to/from stand Pt Will Transfer to Toilet: with supervision;ambulating Pt Will Perform Toileting - Clothing Manipulation and hygiene: with supervision;sitting/lateral leans Additional ADL Goal #1: Pt will verbalize at least 3 strategies for energy conservation during ADL with no cues  Plan      Co-evaluation                 AM-PAC OT 6 Clicks Daily Activity     Outcome  Measure   Help from another person eating meals?: A Little Help from another person taking care of personal grooming?: A Little Help from another person toileting, which includes using toliet, bedpan, or urinal?: A Lot Help from another person bathing (including washing, rinsing, drying)?: A Little Help from another person to put on and taking off regular upper body clothing?: A Little Help from another person to put on and taking off regular lower body clothing?: A Lot 6 Click Score: 16    End of Session Equipment Utilized During Treatment: Gait belt;Rolling walker (2 wheels)  OT Visit Diagnosis: Unsteadiness on feet (R26.81);Other abnormalities of gait and mobility (R26.89);Muscle weakness (generalized) (M62.81);History of falling (Z91.81);Low vision, both eyes (H54.2)   Activity Tolerance Patient tolerated treatment well   Patient Left with call bell/phone within reach;in bed;with bed alarm set   Nurse Communication Mobility status;Precautions        Time: 8867-8840 OT Time Calculation (min): 27 min  Charges: OT General Charges $OT Visit: 1 Visit OT Treatments $Self Care/Home Management : 8-22 mins $Therapeutic Activity: 8-22 mins  Leita DEL OTR/L Acute Rehabilitation Services Office: [phone]  Leita PARAS [facility] 09/27/2024, 1:20 PM

## 2024-09-27 NOTE — Progress Notes (Signed)
 AVS in packet for transport.

## 2024-09-27 NOTE — Progress Notes (Signed)
 Progress Note   Patient: Bailey [name] FMW:[id number] DOB: 09-08-1933 DOA: 09/22/2024     5 DOS: the patient was seen and examined on 09/27/2024   Brief hospital course: The patient is a 89 yr old woman who was brought to the ED by her family with complaints of fall and altered mental status. Upon presentation the patient was grunting and not able to follow commands. The patient was last normal the previous day on the phone at 7 pm.  The daughter noted that the patient had had increased urine frequency lately.   She has a past medical history significant for hypertension, GERD, Atrial fibrillation, CKD 3, Raynaud's, and anemia.   In th ED the patient was found to have temp of 101.6, tachypnea with RR of 24, HR of 92, and a drop in BP from 185/72 to 159/51 with a WBC count of 19.8 meeting criteria for sepsis. She received a 1 L bolus of NS and then 150 cc/hr of NS. Urinalysis was negative, but urine culture has grown out 100K CFU of e coli. CTA of the head and neck with and without contrast was obtained and was negative for acute abnormalities. There is age related atrophy and chronic small vessel ischemia. Lactic acid was 3.4 initially, but did come down to 2.1. CT abdomen and pelvis demonstrates a cyst like structure in the patient's right groin. No signs of acute diverticulitis cholecystitis or cholelithiasis on CT abdomen and pelvis.  The patient was admitted to a medical bed. Blood cultures were obtained, but have had no growth.   On 09/23/2024 the patient has improved sensorium. She is oriented to place, but not year or date. Her potassium is again down to 3.2 after supplementation. The patient is receiving IV Azactam. Family is at bedside.  On 09/23/2024 2/2 blood cultures were positive for gram + rods. No susceptibilities will be performed. This is thought to be contaminate, but did come from both bottles. Will repeat cultures. Urine cultures have grown out E. Coli.  On 09/24/2024, the  patient is sitting up at bedside. She states that she is feeling better. Daughter is at bedside. Susceptibilities for E. Coli demonstrate sentifivity to azactam. Will continue current antibiotics.   On 09/25/2024 the patient is found to have severe urinary retention with >  1 liter in post void residual. She will have foley catheter placed if she has more than 300 cc in post void residual again. She will be started on Flomax each evening. She will need to follow up with urology as outpatient.   On 09/26/2024 there have been no further complaints of urinary retention. The patient has been evaluated by PT and TOC has been consulted for placement in SNF.  Assessment and Plan: Principal Problem:   SIRS (systemic inflammatory response syndrome) (HCC) Active Problems:   Paroxysmal atrial fibrillation (HCC)   GERD   Raynauds syndrome   HTN (hypertension)   Anemia   Sjogren's syndrome with keratoconjunctivitis sicca   CKD (chronic kidney disease), stage III (HCC)   Moderate aortic regurgitation   Acute encephalopathy   Sepsis: Resolved. The patient presented meeting sepsis criteria. Source of sepsis being UTI. She has received over 2L and has resolving criteria except that she continues to have an elevated WBC. Sepsis is likely due to UTI. Urine culture has grown out E. Coli. She is receiving Azactam to which the species is sensitive.   UTI Urine culture has grown out E. Coli which is found to be sensitive to  azactam. Consider change to oral medication on 09/25/2204.   Urinary retention The patient is found to have severe urinary retention with >  1 liter in post void residual. She will have foley catheter placed if she has more than 300 cc in post void residual again. She will be started on Flomax each evening. She will need to follow up with urology as outpatient. No further evidence of urinary retention.  Metabolic encephalopathy due to sepsis.  Resolved.   - CTA head and neck negative  for acute pathology. There is evidence of age related atrophy and chronic small vessel ischemia. - EEG was performed and was negative for signs of seizure. It had diffuse slowly consistent with acute encephalopathy.  Weakness: PT/OT has evaluated the patient and has recommended SNF placement. TOC has been consulted.   Hypertension - Can continue metoprolol  - Consider losartan .   Sjogren's disease Raynaud's disease Sicca symptoms - Continue home eyedrops   History of A-fib - Continue metoprolol  - Not on anticoagulation   History of of CKD 3 - Creatinine stable at 0.85.  - Trend renal function and electrolytes   DVT prophylaxis:      Lovenox Code Status:              Full Family Communication:       Updated at bedside    Subjective: The patient is resting comfortably. Family at bedside.   Physical Exam: Vitals:   09/26/24 1931 09/26/24 2102 09/26/24 2333 09/27/24 0350  BP: (!) 176/67 (!) 155/60 120/84 (!) 147/68  Pulse: (!) 105 (!) 106 76 88  Resp: 19 20 20 20   Temp: 98.5 F (36.9 C) 98.7 F (37.1 C) 98.4 F (36.9 C) 97.9 F (36.6 C)  TempSrc: Oral Oral Oral Oral  SpO2: 97% 96% 95% 97%  Weight:      Height:       Exam:  Constitutional:  The patient is awake, alert, and oriented x 3. No acute distress. Eyes:  pupils and irises appear normal Normal lids and conjunctivae Respiratory:  No increased work of breathing. No wheezes, rales, or rhonchi No tactile fremitus Cardiovascular:  Regular rate and rhythm No murmurs, ectopy, or gallups. No lateral PMI. No thrills. Abdomen:  Abdomen is soft, non-tender, non-distended No hernias, masses, or organomegaly Normoactive bowel sounds.  Musculoskeletal:  No cyanosis, clubbing, or edema Skin:  No rashes, lesions, ulcers palpation of skin: no induration or nodules Neurologic:  CN 2-12 intact Sensation all 4 extremities intact Psychiatric:  The patient is unable to cooperate with exam.  Data Reviewed:  CBC,  BMP  Family Communication: Family is at bedside.  Disposition: Status is: Inpatient Remains inpatient appropriate because: Awaiting susceptibilities. Continue IV antibiotics.  Planned Discharge Destination: SNF    Time spent: 32 minutes  Author: Kieran Arreguin, DO 09/26/2024 6:52 PM  For on call review [email or web address].

## 2024-09-27 NOTE — Progress Notes (Addendum)
 Called [facility] at [phone] to give report with no answer. Will attempt again.  Called again with no answer [phone].

## 2024-09-27 NOTE — TOC Transition Note (Signed)
 Transition of Care [facility]) - Discharge Note   Patient Details  Name: Bailey [name] MRN: [id number] Date of Birth: [birth year]  Transition of Care [facility]) CM/SW Contact:  Whitfield Campanile, Student-Social Work Phone Number: 09/27/2024, 1:23 PM   Clinical Narrative:     Patient will Discharge to: [facility]  Discharge Date: 09/27/2024  Family Notified: Arland Pool [phone]  Transport By: ALISIA     Per MD patient is ready for discharge. RN, patient, and facility notified of discharge. Discharge Summary sent to facility. RN given number for report [phone]. Ambulance transport requested for patient.     Social Work Administrator, arts off.  Whitfield Campanile, MSW Intern   Final next level of care: Skilled Nursing Facility Barriers to Discharge: No Barriers Identified   Patient Goals and CMS Choice            Discharge Placement PASRR number recieved: 09/27/24 Existing PASRR number confirmed : 09/27/24          Patient chooses bed at: [facility] Patient to be transferred to facility by: [facility] Name of family member notified: pool arland (Daughter)  [phone] (Mobile) Patient and family notified of of transfer: 09/27/24  Discharge Plan and Services Additional resources added to the After Visit Summary for   In-house Referral: Clinical Social Work                                   Social Drivers of Health (SDOH) Interventions SDOH Screenings   Food Insecurity: No Food Insecurity (09/22/2024)  Housing: Low Risk  (09/22/2024)  Transportation Needs: No Transportation Needs (09/22/2024)  Utilities: Not At Risk (09/22/2024)  Depression (PHQ2-9): Low Risk  (11/17/2023)  Financial Resource Strain: Low Risk  (11/15/2022)  Physical Activity: Insufficiently Active (11/17/2023)  Social Connections: Unknown (11/17/2023)  Stress: No Stress Concern Present (11/17/2023)  Tobacco Use: Medium Risk (09/22/2024)     Readmission Risk Interventions     No data to  display

## 2024-09-27 NOTE — Progress Notes (Signed)
 Mobility Specialist Progress Note:   09/27/24 1051  Mobility  Activity Pivoted/transferred to/from [facility]  Level of Assistance Minimal assist, patient does 75% or more  Assistive Device Front wheel walker  Distance Ambulated (ft) 2 ft  Activity Response Tolerated well  Mobility Referral Yes  Mobility visit 1 Mobility  Mobility Specialist Start Time (ACUTE ONLY) 1045  Mobility Specialist Stop Time (ACUTE ONLY) 1051  Mobility Specialist Time Calculation (min) (ACUTE ONLY) 6 min   Pt received in bed, agreeable to transfer to [facility]. Tolerated well, c/o weakness. MinA to stand with RW. Left with call bell in reach, all needs met.    Bailey [name] Mobility Specialist Please contact via Special Educational Needs Teacher or  Rehab office at [phone]

## 2024-09-27 NOTE — Assessment & Plan Note (Signed)
 Amlodipine was added to the patient's losartan  and metoprolol  to better control her BP.

## 2024-09-28 LAB — CULTURE, BLOOD (ROUTINE X 2)
Culture: NO GROWTH
Culture: NO GROWTH

## 2024-10-01 DIAGNOSIS — G9340 Encephalopathy, unspecified: Secondary | ICD-10-CM | POA: Diagnosis not present

## 2024-10-01 DIAGNOSIS — I10 Essential (primary) hypertension: Secondary | ICD-10-CM | POA: Diagnosis not present

## 2024-10-01 DIAGNOSIS — I7300 Raynaud's syndrome without gangrene: Secondary | ICD-10-CM | POA: Diagnosis not present

## 2024-10-01 DIAGNOSIS — I480 Paroxysmal atrial fibrillation: Secondary | ICD-10-CM | POA: Diagnosis not present

## 2024-10-04 DIAGNOSIS — N390 Urinary tract infection, site not specified: Secondary | ICD-10-CM

## 2024-10-04 DIAGNOSIS — N183 Chronic kidney disease, stage 3 unspecified: Secondary | ICD-10-CM

## 2024-10-04 DIAGNOSIS — A419 Sepsis, unspecified organism: Secondary | ICD-10-CM | POA: Diagnosis not present

## 2024-10-04 DIAGNOSIS — R339 Retention of urine, unspecified: Secondary | ICD-10-CM

## 2024-10-04 DIAGNOSIS — I7300 Raynaud's syndrome without gangrene: Secondary | ICD-10-CM

## 2024-10-04 DIAGNOSIS — G9340 Encephalopathy, unspecified: Secondary | ICD-10-CM | POA: Diagnosis not present

## 2024-10-04 DIAGNOSIS — I480 Paroxysmal atrial fibrillation: Secondary | ICD-10-CM | POA: Diagnosis not present

## 2024-10-04 DIAGNOSIS — I10 Essential (primary) hypertension: Secondary | ICD-10-CM | POA: Diagnosis not present

## 2024-10-05 ENCOUNTER — Telehealth: Payer: Self-pay | Admitting: Family Medicine

## 2024-10-05 NOTE — Telephone Encounter (Signed)
 Please see patient message and advise.   Copied from CRM #[id number]. Topic: Clinical - Medical Advice >> Oct 05, 2024  2:11 PM Lauren C wrote: Reason for CRM: Daughter arland is on the line (on dpr) and she is very concerned about pt. She was in hospital with severe sepsis after she was found catatonic in her apartment and was transferred to another facility where they took her off antibiotics and no one has been able to tell her why they stopped the medication and she has been very listless, not acting herself. She is concerned she is in sepsis but no one is following up on it. She wants to know if Dr. Katrinka can do anything or what she should do. Her # is [phone]

## 2024-10-05 NOTE — Telephone Encounter (Signed)
 Spoke with daughter-explained that she only needed 4 additional days of antibiotics based on the hospital discharge but if she has concerns about her current condition to continue to press for further evaluation at the facility  She is trying to encourage her mom to get up and move outside of PT and OT but she is rather resistant and I told her to pass along to the patient that I would love to see her up and moving more so we can get her back home and stronger  We are happy to see her once she is discharged but also expressed the need for daughter to continue to advocate for her at the facility-she seems to be doing a great job of that

## 2024-10-08 DIAGNOSIS — R339 Retention of urine, unspecified: Secondary | ICD-10-CM | POA: Diagnosis not present

## 2024-10-08 DIAGNOSIS — N183 Chronic kidney disease, stage 3 unspecified: Secondary | ICD-10-CM | POA: Diagnosis not present

## 2024-10-08 DIAGNOSIS — N390 Urinary tract infection, site not specified: Secondary | ICD-10-CM | POA: Diagnosis not present

## 2024-10-09 ENCOUNTER — Ambulatory Visit: Payer: Self-pay

## 2024-10-09 DIAGNOSIS — D72829 Elevated white blood cell count, unspecified: Secondary | ICD-10-CM | POA: Diagnosis not present

## 2024-10-09 DIAGNOSIS — N390 Urinary tract infection, site not specified: Secondary | ICD-10-CM | POA: Diagnosis not present

## 2024-10-09 NOTE — Telephone Encounter (Signed)
 FYI Only or Action Required?: FYI only for provider: Advice only call.  Patient was last seen in primary care on 05/14/2024 by Katrinka Garnette KIDD, MD.  Called Nurse Triage reporting Advice Only.  Symptoms began several weeks ago.  Interventions attempted: Prescription medications: Abx's.  Symptoms are: gradually worsening.  Triage Disposition: Information or Advice Only Call  Patient/caregiver understands and will follow disposition?: Yes  Copied from CRM [id number]. Topic: Clinical - Red Word Triage >> Oct 09, 2024 10:45 AM Eva FALCON wrote: Red Word that prompted transfer to Nurse Triage: Pt daughter Arland called in and states pt is too tired and too weak to do rehab. States she has had diarrhea, has UTI which was given an antibiotic, states the facility she is at has essentially not done anything to help her. Daughter states she requested white blood count checks and they kept telling here they'd do it and do it and nothing, finally over the weekend, urine was checked and she has a UTI, antibiotics were given, but daughter is just concerned and unsure what to do next, spoke to Dr. Katrinka Friday. Reason for Disposition  Health information question, no triage required and triager able to answer question  Answer Assessment - Initial Assessment Questions 1. REASON FOR CALL: What is the main reason for your call? or How can I best help you?     Spoke with pts daughter Arland (on [place]). Pt recently in hospital from 10-25 to 10-30 for SIRS. Discharged to [facility] with 4 more days abx which she completed. Pt did great first few days of rehab but then began to decline on Monday with increased lethargy and not engaging fully in PT sessions. Daughter expressed concern asking to have UA and labwork drawn. Was promised there would be follow-up on requests but took several days and had to ask several times. Daughter spoke with Dr. Katrinka on Friday with concerns about delayed care  and was told to continue to advocate for pt. Pt with onset diarrhea on Friday. UA eventually performed and pt positive for UTI, started on abx. Blood work performed and WBC slightly elevated. Daughter spoke with PA and director Josh about concerns with lack of timely care/intervention. Advised pt continue to advocate for her mom while at the facility and to keep in close communication with the staff there. Explained if she wanted to discuss switching to different facility to reach out to charge nurse and case manager to discuss next steps. Not interested in switching facilities at this time, just would like to have pt status update relayed to PCP.  2. SYMPTOMS : Do you have any symptoms?      Pt reports mom has UTI symptoms and diarrhea  3. OTHER QUESTIONS: Do you have any other questions?     Denies  Protocols used: Information Only Call - No Triage-A-AH

## 2024-10-09 NOTE — Telephone Encounter (Signed)
 Please let daughter know I am glad she is getting treated for the UTI and hopefully she can make some improvement with that and her strength-she is doing a great job advocating for her mom and that is the best thing she can do at this point

## 2024-10-09 NOTE — Telephone Encounter (Signed)
Please see patient message and advise.

## 2024-10-10 NOTE — Telephone Encounter (Signed)
 Relayed provider remarks and recommendations to Bailey [name]. She advised understanding and thanks to Dr. Katrinka. No further questions at this time.

## 2024-10-15 DIAGNOSIS — K9189 Other postprocedural complications and disorders of digestive system: Secondary | ICD-10-CM | POA: Diagnosis not present

## 2024-10-15 DIAGNOSIS — R234 Changes in skin texture: Secondary | ICD-10-CM | POA: Diagnosis not present

## 2024-10-15 DIAGNOSIS — L989 Disorder of the skin and subcutaneous tissue, unspecified: Secondary | ICD-10-CM | POA: Diagnosis not present

## 2024-10-19 DIAGNOSIS — M6281 Muscle weakness (generalized): Secondary | ICD-10-CM | POA: Diagnosis not present

## 2024-10-19 DIAGNOSIS — M3501 Sicca syndrome with keratoconjunctivitis: Secondary | ICD-10-CM | POA: Diagnosis not present

## 2024-10-19 DIAGNOSIS — I480 Paroxysmal atrial fibrillation: Secondary | ICD-10-CM | POA: Diagnosis not present

## 2024-10-19 DIAGNOSIS — G9340 Encephalopathy, unspecified: Secondary | ICD-10-CM | POA: Diagnosis not present

## 2024-10-19 DIAGNOSIS — R2681 Unsteadiness on feet: Secondary | ICD-10-CM

## 2024-10-22 ENCOUNTER — Telehealth: Payer: Self-pay

## 2024-10-22 NOTE — Transitions of Care (Post Inpatient/ED Visit) (Signed)
 10/22/2024  Name: Bailey [name] MRN: [id number] DOB: 09-19-1933  Today's TOC FU Call Status: Today's TOC FU Call Status:: Successful TOC FU Call Completed TOC FU Call Complete Date: 10/22/24  Patient's Name and Date of Birth confirmed. DOB, Name  Transition Care Management Follow-up Telephone Call Date of Discharge: 10/19/24 Discharge Facility: Other [facility]) Name of Other (Non-[facility]) Discharge Facility: [facility] Type of Discharge: Inpatient Admission Primary Inpatient Discharge Diagnosis:: Sepsis How have you been since you were released from the hospital?: Better Any questions or concerns?: No  Items Reviewed: Did you receive and understand the discharge instructions provided?: Yes Medications obtained,verified, and reconciled?: Yes (Medications Reviewed) Any new allergies since your discharge?: No Dietary orders reviewed?: NA Do you have support at home?: Yes People in Home [RPT]: facility resident Name of Support/Comfort Primary Source: [facility] [employer]  Medications Reviewed Today: Medications Reviewed Today     Reviewed by Lavelle Charmaine KATHEE, LPN (Licensed Practical Nurse) on 10/22/24 at 1005  Med List Status: <None>   Medication Order Taking? Sig Documenting Provider Last Dose Status Informant  amLODipine  (NORVASC ) 10 MG tablet [id number]  Take 1 tablet (10 mg total) by mouth daily. Swayze, Ava, DO  Active   aspirin 81 MG tablet [id number]  Take 81 mg by mouth daily. [provider]  Active Self, Family Member  cefUROXime  (CEFTIN ) 250 MG tablet [id number]  Take 1 tablet (250 mg total) by mouth 2 (two) times daily with a meal.  Patient not taking: Reported on 10/22/2024   Swayze, Ava, DO  Active   Cholecalciferol (VITAMIN D ) 50 MCG (2000 UT) CAPS [id number]  Take 2,000 Units by mouth daily. [provider]  Active Self, Family Member  cycloSPORINE  (RESTASIS ) 0.05 % ophthalmic emulsion [id number]  Place 1 drop into both eyes 2 (two)  times daily. Katrinka Garnette KIDD, MD  Active Self, Family Member  escitalopram  (LEXAPRO ) 10 MG tablet [id number]  Take 1 tablet (10 mg total) by mouth daily. Katrinka Garnette KIDD, MD  Active Self, Family Member  fish oil-omega-3 fatty acids 1000 MG capsule [id number]  Take one capsule by mouth  daily [provider]  Active Self, Family Member  losartan  (COZAAR ) 100 MG tablet [id number]  Take 1 tablet (100 mg total) by mouth daily. Katrinka Garnette KIDD, MD  Active Self, Family Member           Med Note (SATTERFIELD, TEENA FORBES Repress Sep 23, 2024  4:30 PM)    metoprolol  tartrate (LOPRESSOR ) 25 MG tablet [id number]  Take 1 tablet (25 mg total) by mouth 2 (two) times daily. Parthenia Olivia HERO, PA-C  Expired 10/21/24 2359 Self, Family Member           Med Note (SATTERFIELD, TEENA FORBES   Sun Sep 23, 2024  4:30 PM)    Multiple Vitamins-Minerals (PRESERVISION AREDS PO) [id number]  Take 1 capsule by mouth daily at 12 noon. [provider]  Active Self, Family Member  tamsulosin  (FLOMAX ) 0.4 MG CAPS capsule [id number]  Take 1 capsule (0.4 mg total) by mouth daily after supper. Soledad Ligas, DO  Active   Med List Note Steffi Nian, CPhT 09/22/24 1610): Patient manages her own medications at [facility].            Home Care and Equipment/Supplies: Were Home Health Services Ordered?: NA Any new equipment or medical supplies ordered?: NA  Functional Questionnaire: Do you need assistance with bathing/showering or dressing?: Yes Do you need assistance with  meal preparation?: Yes Do you need assistance with eating?: No Do you have difficulty maintaining continence: No Do you need assistance with getting out of bed/getting out of a chair/moving?: No Do you have difficulty managing or taking your medications?: Yes  Follow up appointments reviewed: PCP Follow-up appointment confirmed?: Yes Date of PCP follow-up appointment?: 10/29/24 Follow-up Provider: Dr. Katrinka Driscilla Salvage Follow-up appointment confirmed?: NA Do you need transportation to your follow-up appointment?: No Do you understand care options if your condition(s) worsen?: Yes-patient verbalized understanding    SIGNATURE Charmaine Bloodgood, LPN [facility] Advisor [facility] You Are. We Are. [facility] Direct Dial [phone]

## 2024-10-22 NOTE — Telephone Encounter (Signed)
 Noted thanks

## 2024-10-23 DIAGNOSIS — M62512 Muscle wasting and atrophy, not elsewhere classified, left shoulder: Secondary | ICD-10-CM | POA: Diagnosis not present

## 2024-10-23 DIAGNOSIS — R2681 Unsteadiness on feet: Secondary | ICD-10-CM | POA: Diagnosis not present

## 2024-10-23 DIAGNOSIS — M62541 Muscle wasting and atrophy, not elsewhere classified, right hand: Secondary | ICD-10-CM | POA: Diagnosis not present

## 2024-10-23 DIAGNOSIS — R293 Abnormal posture: Secondary | ICD-10-CM | POA: Diagnosis not present

## 2024-10-23 DIAGNOSIS — R278 Other lack of coordination: Secondary | ICD-10-CM | POA: Diagnosis not present

## 2024-10-23 DIAGNOSIS — M62522 Muscle wasting and atrophy, not elsewhere classified, left upper arm: Secondary | ICD-10-CM | POA: Diagnosis not present

## 2024-10-23 DIAGNOSIS — M62542 Muscle wasting and atrophy, not elsewhere classified, left hand: Secondary | ICD-10-CM | POA: Diagnosis not present

## 2024-10-23 DIAGNOSIS — M62511 Muscle wasting and atrophy, not elsewhere classified, right shoulder: Secondary | ICD-10-CM | POA: Diagnosis not present

## 2024-10-24 DIAGNOSIS — M62512 Muscle wasting and atrophy, not elsewhere classified, left shoulder: Secondary | ICD-10-CM | POA: Diagnosis not present

## 2024-10-24 DIAGNOSIS — R2681 Unsteadiness on feet: Secondary | ICD-10-CM | POA: Diagnosis not present

## 2024-10-24 DIAGNOSIS — M62511 Muscle wasting and atrophy, not elsewhere classified, right shoulder: Secondary | ICD-10-CM | POA: Diagnosis not present

## 2024-10-24 DIAGNOSIS — M62541 Muscle wasting and atrophy, not elsewhere classified, right hand: Secondary | ICD-10-CM | POA: Diagnosis not present

## 2024-10-24 DIAGNOSIS — M62542 Muscle wasting and atrophy, not elsewhere classified, left hand: Secondary | ICD-10-CM | POA: Diagnosis not present

## 2024-10-24 DIAGNOSIS — M62522 Muscle wasting and atrophy, not elsewhere classified, left upper arm: Secondary | ICD-10-CM | POA: Diagnosis not present

## 2024-10-24 DIAGNOSIS — R278 Other lack of coordination: Secondary | ICD-10-CM | POA: Diagnosis not present

## 2024-10-24 DIAGNOSIS — R293 Abnormal posture: Secondary | ICD-10-CM | POA: Diagnosis not present

## 2024-10-26 DIAGNOSIS — M62522 Muscle wasting and atrophy, not elsewhere classified, left upper arm: Secondary | ICD-10-CM | POA: Diagnosis not present

## 2024-10-26 DIAGNOSIS — M62511 Muscle wasting and atrophy, not elsewhere classified, right shoulder: Secondary | ICD-10-CM | POA: Diagnosis not present

## 2024-10-26 DIAGNOSIS — R2681 Unsteadiness on feet: Secondary | ICD-10-CM | POA: Diagnosis not present

## 2024-10-26 DIAGNOSIS — R278 Other lack of coordination: Secondary | ICD-10-CM | POA: Diagnosis not present

## 2024-10-26 DIAGNOSIS — R293 Abnormal posture: Secondary | ICD-10-CM | POA: Diagnosis not present

## 2024-10-26 DIAGNOSIS — M62541 Muscle wasting and atrophy, not elsewhere classified, right hand: Secondary | ICD-10-CM | POA: Diagnosis not present

## 2024-10-26 DIAGNOSIS — M62512 Muscle wasting and atrophy, not elsewhere classified, left shoulder: Secondary | ICD-10-CM | POA: Diagnosis not present

## 2024-10-26 DIAGNOSIS — M62542 Muscle wasting and atrophy, not elsewhere classified, left hand: Secondary | ICD-10-CM | POA: Diagnosis not present

## 2024-10-29 ENCOUNTER — Ambulatory Visit: Payer: Self-pay | Admitting: Family Medicine

## 2024-10-29 ENCOUNTER — Encounter: Payer: Self-pay | Admitting: Family Medicine

## 2024-10-29 ENCOUNTER — Ambulatory Visit: Admitting: Family Medicine

## 2024-10-29 VITALS — BP 122/64 | HR 61 | Temp 97.3°F | Ht 62.0 in | Wt 108.6 lb

## 2024-10-29 DIAGNOSIS — I10 Essential (primary) hypertension: Secondary | ICD-10-CM

## 2024-10-29 DIAGNOSIS — R338 Other retention of urine: Secondary | ICD-10-CM

## 2024-10-29 DIAGNOSIS — I480 Paroxysmal atrial fibrillation: Secondary | ICD-10-CM

## 2024-10-29 DIAGNOSIS — N3090 Cystitis, unspecified without hematuria: Secondary | ICD-10-CM | POA: Diagnosis not present

## 2024-10-29 DIAGNOSIS — Z8619 Personal history of other infectious and parasitic diseases: Secondary | ICD-10-CM

## 2024-10-29 DIAGNOSIS — F419 Anxiety disorder, unspecified: Secondary | ICD-10-CM | POA: Insufficient documentation

## 2024-10-29 DIAGNOSIS — N183 Chronic kidney disease, stage 3 unspecified: Secondary | ICD-10-CM

## 2024-10-29 LAB — COMPREHENSIVE METABOLIC PANEL WITH GFR
ALT: 24 U/L (ref 0–35)
AST: 24 U/L (ref 0–37)
Albumin: 4.3 g/dL (ref 3.5–5.2)
Alkaline Phosphatase: 109 U/L (ref 39–117)
BUN: 23 mg/dL (ref 6–23)
CO2: 27 meq/L (ref 19–32)
Calcium: 9.8 mg/dL (ref 8.4–10.5)
Chloride: 101 meq/L (ref 96–112)
Creatinine, Ser: 1.18 mg/dL (ref 0.40–1.20)
GFR: 41.03 mL/min — ABNORMAL LOW
Glucose, Bld: 95 mg/dL (ref 70–99)
Potassium: 4 meq/L (ref 3.5–5.1)
Sodium: 137 meq/L (ref 135–145)
Total Bilirubin: 0.7 mg/dL (ref 0.2–1.2)
Total Protein: 7.5 g/dL (ref 6.0–8.3)

## 2024-10-29 LAB — CBC WITH DIFFERENTIAL/PLATELET
Basophils Absolute: 0.1 K/uL (ref 0.0–0.1)
Basophils Relative: 0.8 % (ref 0.0–3.0)
Eosinophils Absolute: 0.1 K/uL (ref 0.0–0.7)
Eosinophils Relative: 0.9 % (ref 0.0–5.0)
HCT: 33.5 % — ABNORMAL LOW (ref 36.0–46.0)
Hemoglobin: 11.3 g/dL — ABNORMAL LOW (ref 12.0–15.0)
Lymphocytes Relative: 31.1 % (ref 12.0–46.0)
Lymphs Abs: 2.6 K/uL (ref 0.7–4.0)
MCHC: 33.7 g/dL (ref 30.0–36.0)
MCV: 90.1 fl (ref 78.0–100.0)
Monocytes Absolute: 1.1 K/uL — ABNORMAL HIGH (ref 0.1–1.0)
Monocytes Relative: 12.6 % — ABNORMAL HIGH (ref 3.0–12.0)
Neutro Abs: 4.6 K/uL (ref 1.4–7.7)
Neutrophils Relative %: 54.6 % (ref 43.0–77.0)
Platelets: 337 K/uL (ref 150.0–400.0)
RBC: 3.71 Mil/uL — ABNORMAL LOW (ref 3.87–5.11)
RDW: 14.8 % (ref 11.5–15.5)
WBC: 8.4 K/uL (ref 4.0–10.5)

## 2024-10-29 MED ORDER — AMLODIPINE BESYLATE 10 MG PO TABS: 10.0000 mg | ORAL_TABLET | Freq: Every day | ORAL | 3 refills | Status: DC

## 2024-10-29 MED ORDER — AMLODIPINE BESYLATE 10 MG PO TABS: 10.0000 mg | ORAL_TABLET | Freq: Every day | ORAL | 3 refills | Status: AC

## 2024-10-29 NOTE — Assessment & Plan Note (Signed)
 Urinary retention likely contributed to origin of UTI-remain on tamsulosin .  Offered referral to urology but she wants to hold off for now

## 2024-10-29 NOTE — Assessment & Plan Note (Signed)
 Seems to be better in the hospital but we will check this again outpatient

## 2024-10-29 NOTE — Assessment & Plan Note (Signed)
 No recurrence of A-fib in years thankfully.  She remains on aspirin 81 mg as well as metoprolol  25 mg twice daily which would rate control if needed but also provides protection from palpitations

## 2024-10-29 NOTE — Progress Notes (Signed)
 Phone [phone]   Subjective:  Bailey [name] is a 89 y.o. year old very pleasant female patient who presents for transitional care management and hospital follow up for severe sepsis secondary to urinary tract infection. Patient was hospitalized from 09/22/2024 to 09/27/2024 and then at [facility] from 09/27/2024 to 10/19/2024. A TCM phone call was completed on 10/22/2024. Medical complexity moderate  Patient was brought to emergency department for altered mental status-at that point was only grunting significant change from her baseline of being fully oriented.  She was noting urinary frequency for several days leading up to admission.  Initial uric acid was elevated at 3.4   She was initially treated with Azactam  and urine culture eventually positive for E. coli which was pansensitive-later transition to cefuroxime  for an additional 4 days of treatment and then later started on cephalexin and skilled nursing facility (had worsening symptom(s) off of antibiotic(s) was more forgetful)- she just finished this on 10/27/2024 .   She did have 1 out of 2 blood cultures growing a gram-positive rod that was considered likely contaminant.  Repeat blood cultures were drawn with no growth.  Patient was also noted to have urinary retention up to a liter.  Required In-N-Out catheterization for this.  She was started on tamsulosin .  No recurrent retention-plan was if postvoid residual over 300 to do In-N-Out cath.  They discussed possibility of urology consult.   Patient gradually improved with treatment but had significant weakness from her baseline and was discharged to skilled nursing facility for ongoing physical therapy. She continues to use walker and plan is for long term. She will have home health physical therapy and occupational therapy - we will sign off for home health.   She was noted to have anemia with hemoglobin down to 9.9 Delpha 11.2 on admission-thought dilutional and improved to  11.4.  Her hypertension was poorly controlled so amlodipine  10 mg was added to her regimen of losartan  100 mg and metoprolol  25 mg twice daily . No recent home checks.   CKD stage III noted but during hospitalization some numbers in the 60s-Will evaluate again today outpatient but has tended to be in the 50s for the most part in recent years  For her anxiety she was continued on escitalopram  10 mg. This recent illness has been really stressful for her. Some family with OCD and have done well with Prozac. Several family members have noted some obsessive tendencies.   Sounds like may have had stage I pressure ulcer- used topical that was helpful and has cleared from top of coccyx- I don't see evidence of that today   Morning and evening help getting up and then back to bed in assisted living   See problem oriented charting as well  Past Medical History-  Patient Active Problem List   Diagnosis Date Noted   Sjogren's syndrome with keratoconjunctivitis sicca 05/12/2017    Priority: High   Paroxysmal atrial fibrillation (HCC) 09/05/2008    Priority: High   Anxiety 10/29/2024    Priority: Medium    Acute urinary retention 09/25/2024    Priority: Medium    CKD (chronic kidney disease), stage III (HCC) 12/08/2017    Priority: Medium    Moderate aortic regurgitation 12/08/2017    Priority: Medium    HTN (hypertension) 03/13/2015    Priority: Medium    Raynauds syndrome 10/21/2014    Priority: Medium    Primary osteoarthritis of both hands 08/21/2009    Priority: Medium    PVC (  premature ventricular contraction) 06/04/2009    Priority: Medium    Mitral valve prolapse 12/25/2008    Priority: Medium    Irritable bowel syndrome 09/04/2008    Priority: Medium    Premature ventricular contractions     Priority: Low   Hepatitis     Priority: Low   History of skin cancer     Priority: Low   Primary osteoarthritis of both feet 10/26/2016    Priority: Low   Anemia 03/13/2015     Priority: Low   Recurrent cold sores 10/21/2014    Priority: Low   Edema 06/29/2011    Priority: Low   Osteopenia 09/05/2008    Priority: Low   GERD 09/04/2008    Priority: Low   ALLERGY 09/04/2008    Priority: Low   COLONIC POLYPS 09/04/2008    Priority: Low   Sepsis secondary to UTI (HCC) 09/25/2024   Acute encephalopathy 09/22/2024   Postmenopausal hormone replacement therapy 10/17/2023   Dry eyes, bilateral 05/15/2018   Lumbar radiculopathy 12/12/2017   Allergy    Balance disorder 10/26/2016    Medications- reviewed and updated  A medical reconciliation was performed comparing current medicines to hospital discharge medications. Current Outpatient Medications  Medication Sig Dispense Refill   aspirin 81 MG tablet Take 81 mg by mouth daily.     Cholecalciferol (VITAMIN D ) 50 MCG (2000 UT) CAPS Take 2,000 Units by mouth daily.     cycloSPORINE  (RESTASIS ) 0.05 % ophthalmic emulsion Place 1 drop into both eyes 2 (two) times daily. 0.4 mL 3   escitalopram  (LEXAPRO ) 10 MG tablet Take 1 tablet (10 mg total) by mouth daily. 90 tablet 3   fish oil-omega-3 fatty acids 1000 MG capsule Take one capsule by mouth  daily     losartan  (COZAAR ) 100 MG tablet Take 1 tablet (100 mg total) by mouth daily. 90 tablet 3   metoprolol  tartrate (LOPRESSOR ) 25 MG tablet Take 1 tablet (25 mg total) by mouth 2 (two) times daily. 180 tablet 3   Multiple Vitamins-Minerals (PRESERVISION AREDS PO) Take 1 capsule by mouth daily at 12 noon.     tamsulosin  (FLOMAX ) 0.4 MG CAPS capsule Take 1 capsule (0.4 mg total) by mouth daily after supper. 30 capsule 0   amLODipine  (NORVASC ) 10 MG tablet Take 1 tablet (10 mg total) by mouth daily. 90 tablet 3   No current facility-administered medications for this visit.   Objective  Objective:  BP 122/64 (BP Location: Left Arm, Patient Position: Sitting, Cuff Size: Normal)   Pulse 61   Temp (!) 97.3 F (36.3 C) (Temporal)   Ht 5' 2 (1.575 m)   Wt 108 lb 9.6 oz  (49.3 kg)   LMP  (LMP Unknown)   SpO2 99%   BMI 19.86 kg/m  Gen: NAD though appears more fatigued than her prior baseline CV: RRR no murmurs rubs or gallops Lungs: CTAB no crackles, wheeze, rhonchi  Ext: trace edema Skin: warm, dry Neuro: Walks with walker now   Assessment and Plan:   #TCM/hospital follow up  -If does not qualify for TCM would be [id number] based on time below Assessment & Plan Cystitis Cystitis leading to sepsis and hospitalization.  Adequately treated with cefuroxime  and appeared but later with reportedly new UTI-we were hoping to have records but we do not have those available so we will get a new urine culture to see if any lingering infection-obviously want to be aggressive with this Acute urinary retention Urinary retention likely contributed to  origin of UTI-remain on tamsulosin .  Offered referral to urology but she wants to hold off for now Primary hypertension Hypertension is well-controlled with amlodipine  10 mg added to her losartan  100 mg and metoprolol  25 mg twice daily.  Continue current medication History of sepsis Seems to have recovered from this-patient seems to have potential early signs of change in cognition and she will continue to watch for this along with family Stage 3 chronic kidney disease, unspecified whether stage 3a or 3b CKD (HCC) Seems to be better in the hospital but we will check this again outpatient Paroxysmal atrial fibrillation (HCC) No recurrence of A-fib in years thankfully.  She remains on aspirin 81 mg as well as metoprolol  25 mg twice daily which would rate control if needed but also provides protection from palpitations Anxiety Anxiety reported with some obsessive tendencies. Family history of OCD with family members typically responsive to Prozac.  She is currently on Lexapro  10 mg with mild poor control and they would like to consider alternate-she would like to get through the holidays first and reconsider this afterwards-they  can reach out at any time and we can reconsider   Recommended follow up: Return in about 2 months (around 12/30/2024) for followup or sooner if needed.Schedule b4 you leave. Future Appointments  Date Time Provider Department Center  11/14/2024 11:00 AM Katrinka Garnette KIDD, MD [facility]  11/26/2024  3:00 PM [facility] [place] VISIT 1 [facility]  01/02/2025 11:00 AM Katrinka Garnette KIDD, MD [facility]  05/15/2025  1:00 PM Dolphus Reiter, MD [facility] None  11/28/2025 10:00 AM Dallie Vera GAILS, MD [facility] None    Lab/Order associations:   ICD-10-CM   1. Cystitis  N30.90 Urine Culture    2. Acute urinary retention  R33.8     3. Primary hypertension  I10 Comprehensive metabolic panel with GFR    CBC with Differential/Platelet    4. History of sepsis  Z86.19     5. Stage 3 chronic kidney disease, unspecified whether stage 3a or 3b CKD (HCC)  N18.30     6. Paroxysmal atrial fibrillation (HCC)  I48.0     7. Anxiety  F41.9       Meds ordered this encounter  Medications   DISCONTD: amLODipine  (NORVASC ) 10 MG tablet    Sig: Take 1 tablet (10 mg total) by mouth daily.    Dispense:  90 tablet    Refill:  3   amLODipine  (NORVASC ) 10 MG tablet    Sig: Take 1 tablet (10 mg total) by mouth daily.    Dispense:  90 tablet    Refill:  3   I personally spent a total of 45 minutes in the care of the patient today including preparing to see the patient, getting/reviewing separately obtained history particularly from discharge summary and discharge paperwork I was given from [facility], performing a medically appropriate exam/evaluation, counseling and educating particular about urinary tract diagnosis and treatment she has received as well as predispose risk with urinary retention and offering consult with urology, placing orders, and documenting clinical information in the EHR.   Return precautions advised.  Garnette Katrinka, MD

## 2024-10-29 NOTE — Patient Instructions (Addendum)
 We discussed trial of Prozac to see if things would improve with your anxiety perhaps after the holidays and getting through some therapy at home.   Please stop by lab before you go If you have mychart- we will send your results within 3 business days of us  receiving them.  If you do not have mychart- we will call you about results within 5 business days of us  receiving them.  *please also note that you will see labs on mychart as soon as they post. I will later go in and write notes on them- will say notes from Dr. Katrinka   Recommended follow up: Return in about 2 months (around 12/30/2024) for followup or sooner if needed.Schedule b4 you leave.

## 2024-10-29 NOTE — Assessment & Plan Note (Signed)
 Hypertension is well-controlled with amlodipine  10 mg added to her losartan  100 mg and metoprolol  25 mg twice daily.  Continue current medication

## 2024-10-29 NOTE — Assessment & Plan Note (Signed)
 Anxiety reported with some obsessive tendencies. Family history of OCD with family members typically responsive to Prozac.  She is currently on Lexapro  10 mg with mild poor control and they would like to consider alternate-she would like to get through the holidays first and reconsider this afterwards-they can reach out at any time and we can reconsider

## 2024-10-30 NOTE — Progress Notes (Signed)
 Left a very detailed voice message per her DPR on file. Also informed her that we would be calling back with Urine Culture results. Left our office number if she has any questions till our return call.

## 2024-11-01 LAB — URINE CULTURE
MICRO NUMBER:: 17295559
SPECIMEN QUALITY:: ADEQUATE

## 2024-11-01 MED ORDER — FOSFOMYCIN TROMETHAMINE 3 G PO PACK: 3.0000 g | PACK | Freq: Once | ORAL | 0 refills | Status: AC

## 2024-11-02 NOTE — Progress Notes (Signed)
 Spoke with patient. She understands but wont be able to pick prescription till Sunday when her daughter returns.

## 2024-11-06 DIAGNOSIS — H35033 Hypertensive retinopathy, bilateral: Secondary | ICD-10-CM | POA: Diagnosis not present

## 2024-11-06 DIAGNOSIS — H353112 Nonexudative age-related macular degeneration, right eye, intermediate dry stage: Secondary | ICD-10-CM | POA: Diagnosis not present

## 2024-11-06 DIAGNOSIS — Z961 Presence of intraocular lens: Secondary | ICD-10-CM | POA: Diagnosis not present

## 2024-11-06 DIAGNOSIS — H43813 Vitreous degeneration, bilateral: Secondary | ICD-10-CM | POA: Diagnosis not present

## 2024-11-06 DIAGNOSIS — H353221 Exudative age-related macular degeneration, left eye, with active choroidal neovascularization: Secondary | ICD-10-CM | POA: Diagnosis not present

## 2024-11-07 ENCOUNTER — Encounter: Payer: Self-pay | Admitting: Cardiovascular Disease

## 2024-11-14 ENCOUNTER — Ambulatory Visit: Admitting: Family Medicine

## 2024-11-14 ENCOUNTER — Encounter: Payer: Self-pay | Admitting: Family Medicine

## 2024-11-14 VITALS — BP 128/60 | HR 64 | Temp 98.4°F | Ht 62.0 in | Wt 108.6 lb

## 2024-11-14 DIAGNOSIS — F419 Anxiety disorder, unspecified: Secondary | ICD-10-CM | POA: Diagnosis not present

## 2024-11-14 DIAGNOSIS — R338 Other retention of urine: Secondary | ICD-10-CM

## 2024-11-14 DIAGNOSIS — N183 Chronic kidney disease, stage 3 unspecified: Secondary | ICD-10-CM

## 2024-11-14 DIAGNOSIS — Z87440 Personal history of urinary (tract) infections: Secondary | ICD-10-CM

## 2024-11-14 DIAGNOSIS — I10 Essential (primary) hypertension: Secondary | ICD-10-CM

## 2024-11-14 MED ORDER — TAMSULOSIN HCL 0.4 MG PO CAPS: 0.4000 mg | ORAL_CAPSULE | Freq: Every day | ORAL | 5 refills | Status: AC

## 2024-11-14 NOTE — Progress Notes (Signed)
 Phone [phone] In person visit   Subjective:   Bailey [name] is a 89 y.o. year old very pleasant female patient who presents for/with See problem oriented charting Chief Complaint  Patient presents with   Medical Management of Chronic Issues    6 month follow up; still very fatigued;     Past Medical History-  Patient Active Problem List   Diagnosis Date Noted   Sjogren's syndrome with keratoconjunctivitis sicca 05/12/2017    Priority: High   Paroxysmal atrial fibrillation (HCC) 09/05/2008    Priority: High   Anxiety 10/29/2024    Priority: Medium    Acute urinary retention 09/25/2024    Priority: Medium    CKD (chronic kidney disease), stage III (HCC) 12/08/2017    Priority: Medium    Moderate aortic regurgitation 12/08/2017    Priority: Medium    HTN (hypertension) 03/13/2015    Priority: Medium    Raynauds syndrome 10/21/2014    Priority: Medium    Primary osteoarthritis of both hands 08/21/2009    Priority: Medium    PVC (premature ventricular contraction) 06/04/2009    Priority: Medium    Mitral valve prolapse 12/25/2008    Priority: Medium    Irritable bowel syndrome 09/04/2008    Priority: Medium    Premature ventricular contractions     Priority: Low   Hepatitis     Priority: Low   History of skin cancer     Priority: Low   Primary osteoarthritis of both feet 10/26/2016    Priority: Low   Anemia 03/13/2015    Priority: Low   Recurrent cold sores 10/21/2014    Priority: Low   Edema 06/29/2011    Priority: Low   Osteopenia 09/05/2008    Priority: Low   GERD 09/04/2008    Priority: Low   ALLERGY 09/04/2008    Priority: Low   COLONIC POLYPS 09/04/2008    Priority: Low   Sepsis secondary to UTI (HCC) 09/25/2024   Acute encephalopathy 09/22/2024   Postmenopausal hormone replacement therapy 10/17/2023   Dry eyes, bilateral 05/15/2018   Lumbar radiculopathy 12/12/2017   Allergy    Balance disorder 10/26/2016    Medications- reviewed and  updated Current Outpatient Medications  Medication Sig Dispense Refill   amLODipine  (NORVASC ) 10 MG tablet Take 1 tablet (10 mg total) by mouth daily. 90 tablet 3   aspirin 81 MG tablet Take 81 mg by mouth daily.     Cholecalciferol (VITAMIN D ) 50 MCG (2000 UT) CAPS Take 2,000 Units by mouth daily.     cycloSPORINE  (RESTASIS ) 0.05 % ophthalmic emulsion Place 1 drop into both eyes 2 (two) times daily. 0.4 mL 3   escitalopram  (LEXAPRO ) 10 MG tablet Take 1 tablet (10 mg total) by mouth daily. 90 tablet 3   fish oil-omega-3 fatty acids 1000 MG capsule Take one capsule by mouth  daily     losartan  (COZAAR ) 100 MG tablet Take 1 tablet (100 mg total) by mouth daily. 90 tablet 3   metoprolol  tartrate (LOPRESSOR ) 25 MG tablet Take 1 tablet (25 mg total) by mouth 2 (two) times daily. 180 tablet 3   Multiple Vitamins-Minerals (PRESERVISION AREDS PO) Take 1 capsule by mouth daily at 12 noon.     tamsulosin  (FLOMAX ) 0.4 MG CAPS capsule Take 1 capsule (0.4 mg total) by mouth daily after supper. 30 capsule 5   No current facility-administered medications for this visit.     Objective:  BP 128/60 (BP Location: Left Arm, Patient Position: Sitting,  Cuff Size: Normal)   Pulse 64   Temp 98.4 F (36.9 C) (Temporal)   Ht 5' 2 (1.575 m)   Wt 108 lb 9.6 oz (49.3 kg)   LMP  (LMP Unknown)   SpO2 97%   BMI 19.86 kg/m  Gen: NAD, resting comfortably CV: RRR no murmurs rubs or gallops Lungs: CTAB no crackles, wheeze, rhonchi Ext: no edema Skin: warm, dry     Assessment and Plan   # Cystitis-treated in December outpatient after prior cystitis led to hospitalization due to sepsis.  At that time she had Klebsiella pneumonia and was treated with a single dose of fosfomycin  due to allergies and resistance patterns. She reports there is no any symptoms at this point. Still feels somewhat run down from hospitalization - She did have a slight worsening of renal function at that time and we discussed rechecking  that today with most recent GFR around 41and typically more in the 50's-she preferred recheck next visit -prior retention- was tgiven tamsulosin  and we will continue  Urinary retention-With significant UTI likely associated and hospitalization in 2025 we opted to continue tamsulosin  as long as asymptomatic from medicine  #hypertension/CKD stage III S: compliant with losartan  100 mg, metoprolol  25 mg twice daily, amlodipine  10 mg   Creatinine has been stable around 0.9-1 but she is rather thin. Avoid nsaids.    BP Readings from Last 3 Encounters:  11/14/24 128/60  10/29/24 122/64  09/27/24 (!) 158/68   A/P: hypertension - well controlled continue current medications  Chronic kidney disease III - mild worsening on last check- has visit in February and we opted to recheck at that time  #anxiety- on Lexapro  10 mg and helpful-  generalized anxiety disorder7 essentially stable- no SI -Consider Prozac instead with some obsessive tendencies- shed like to hold steady.      11/14/2024   10:42 AM 10/29/2024   11:24 AM 02/09/2023   11:10 AM 07/22/2021   11:50 AM  GAD 7 : Generalized Anxiety Score  Nervous, Anxious, on Edge 3 3 1 1   Control/stop worrying 2 2 1 1   Worry too much - different things 2 2 1  0  Trouble relaxing 1 1 1  0  Restless 1 1 0 0  Easily annoyed or irritable 1 1 0 0  Afraid - awful might happen 1 2 1 1   Total GAD 7 Score 11 12 5 3   Anxiety Difficulty Somewhat difficult Somewhat difficult Not difficult at all Not difficult at all    Recommended follow up: Return for next already scheduled visit or sooner if needed. Future Appointments  Date Time Provider Department Center  11/26/2024  3:00 PM [facility] VISIT 1 [facility]  01/02/2025 11:00 AM Katrinka Garnette KIDD, MD [facility]  05/15/2025  1:00 PM Dolphus Reiter, MD [facility] None  11/28/2025 10:00 AM Dallie Vera GAILS, MD [facility] None    Lab/Order associations:   ICD-10-CM   1. History of  cystitis  Z87.440     2. Primary hypertension  I10     3. Stage 3 chronic kidney disease, unspecified whether stage 3a or 3b CKD (HCC)  N18.30     4. Anxiety  F41.9     5. Acute urinary retention  R33.8       Meds ordered this encounter  Medications   tamsulosin  (FLOMAX ) 0.4 MG CAPS capsule    Sig: Take 1 capsule (0.4 mg total) by mouth daily after supper.    Dispense:  30  capsule    Refill:  5    Return precautions advised.  Garnette Lukes, MD

## 2024-11-14 NOTE — Patient Instructions (Addendum)
 I'm thrilled you are doing better- hoping you continue to gradually get stronger outside of hospitalization. We discussed doing urine but with no symptoms we opted out- lets reevaluate at next visit and also update bloodwork  Recommended follow up: Return for next already scheduled visit or sooner if needed.

## 2024-12-26 ENCOUNTER — Ambulatory Visit

## 2024-12-26 NOTE — Progress Notes (Signed)
 Will need to be rescheduled per  pt

## 2024-12-27 ENCOUNTER — Ambulatory Visit

## 2025-01-02 ENCOUNTER — Ambulatory Visit: Admitting: Family Medicine

## 2025-01-02 ENCOUNTER — Encounter: Payer: Self-pay | Admitting: Family Medicine

## 2025-01-02 ENCOUNTER — Ambulatory Visit

## 2025-01-02 ENCOUNTER — Telehealth: Payer: Self-pay | Admitting: Family Medicine

## 2025-01-02 DIAGNOSIS — I10 Essential (primary) hypertension: Secondary | ICD-10-CM

## 2025-01-02 DIAGNOSIS — I480 Paroxysmal atrial fibrillation: Secondary | ICD-10-CM

## 2025-01-02 DIAGNOSIS — N183 Chronic kidney disease, stage 3 unspecified: Secondary | ICD-10-CM

## 2025-01-02 LAB — LIPID PANEL
Cholesterol: 170 mg/dL (ref 28–200)
HDL: 89.9 mg/dL
LDL Cholesterol: 69 mg/dL (ref 10–99)
NonHDL: 79.62
Total CHOL/HDL Ratio: 2
Triglycerides: 55 mg/dL (ref 10.0–149.0)
VLDL: 11 mg/dL (ref 0.0–40.0)

## 2025-01-02 LAB — COMPREHENSIVE METABOLIC PANEL WITH GFR
ALT: 19 U/L (ref 3–35)
AST: 23 U/L (ref 5–37)
Albumin: 4.1 g/dL (ref 3.5–5.2)
Alkaline Phosphatase: 84 U/L (ref 39–117)
BUN: 25 mg/dL — ABNORMAL HIGH (ref 6–23)
CO2: 25 meq/L (ref 19–32)
Calcium: 9.7 mg/dL (ref 8.4–10.5)
Chloride: 100 meq/L (ref 96–112)
Creatinine, Ser: 1.01 mg/dL (ref 0.40–1.20)
GFR: 49.39 mL/min — ABNORMAL LOW
Glucose, Bld: 93 mg/dL (ref 70–99)
Potassium: 4.3 meq/L (ref 3.5–5.1)
Sodium: 134 meq/L — ABNORMAL LOW (ref 135–145)
Total Bilirubin: 0.5 mg/dL (ref 0.2–1.2)
Total Protein: 7.2 g/dL (ref 6.0–8.3)

## 2025-01-02 LAB — CBC WITH DIFFERENTIAL/PLATELET
Basophils Absolute: 0.1 10*3/uL (ref 0.0–0.1)
Basophils Relative: 0.6 % (ref 0.0–3.0)
Eosinophils Absolute: 0.2 10*3/uL (ref 0.0–0.7)
Eosinophils Relative: 3 % (ref 0.0–5.0)
HCT: 32.6 % — ABNORMAL LOW (ref 36.0–46.0)
Hemoglobin: 11.1 g/dL — ABNORMAL LOW (ref 12.0–15.0)
Lymphocytes Relative: 26.9 % (ref 12.0–46.0)
Lymphs Abs: 2.1 10*3/uL (ref 0.7–4.0)
MCHC: 34.2 g/dL (ref 30.0–36.0)
MCV: 89.7 fl (ref 78.0–100.0)
Monocytes Absolute: 1 10*3/uL (ref 0.1–1.0)
Monocytes Relative: 12.9 % — ABNORMAL HIGH (ref 3.0–12.0)
Neutro Abs: 4.5 10*3/uL (ref 1.4–7.7)
Neutrophils Relative %: 56.6 % (ref 43.0–77.0)
Platelets: 294 10*3/uL (ref 150.0–400.0)
RBC: 3.63 Mil/uL — ABNORMAL LOW (ref 3.87–5.11)
RDW: 14 % (ref 11.5–15.5)
WBC: 7.9 10*3/uL (ref 4.0–10.5)

## 2025-01-02 NOTE — Telephone Encounter (Signed)
 Patient's AWV had to be canceled from today, 01/02/25 but patient would like to reschedule this. Can you call patient and reschedule this?
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
## 2025-01-03 ENCOUNTER — Ambulatory Visit: Payer: Self-pay | Admitting: Family Medicine

## 2025-01-04 NOTE — Progress Notes (Signed)
 Left a detailed message with patient according to DPR. Also left phone # if patient has questions.

## 2025-05-08 ENCOUNTER — Ambulatory Visit: Admitting: Family Medicine

## 2025-05-15 ENCOUNTER — Ambulatory Visit: Admitting: Rheumatology

## 2025-11-28 ENCOUNTER — Encounter: Admitting: Obstetrics and Gynecology
